# Patient Record
Sex: Female | Born: 1970 | Race: Black or African American | Hispanic: No | Marital: Married | Smoking: Never smoker
Health system: Southern US, Community
[De-identification: ages and names within clinical notes are randomized; demographics above are authoritative.]

## PROBLEM LIST (undated history)

## (undated) DIAGNOSIS — F419 Anxiety disorder, unspecified: Secondary | ICD-10-CM

## (undated) DIAGNOSIS — R0600 Dyspnea, unspecified: Secondary | ICD-10-CM

## (undated) DIAGNOSIS — G43909 Migraine, unspecified, not intractable, without status migrainosus: Secondary | ICD-10-CM

## (undated) DIAGNOSIS — T7840XA Allergy, unspecified, initial encounter: Secondary | ICD-10-CM

## (undated) DIAGNOSIS — D649 Anemia, unspecified: Secondary | ICD-10-CM

## (undated) DIAGNOSIS — K219 Gastro-esophageal reflux disease without esophagitis: Secondary | ICD-10-CM

## (undated) DIAGNOSIS — I10 Essential (primary) hypertension: Secondary | ICD-10-CM

## (undated) DIAGNOSIS — C189 Malignant neoplasm of colon, unspecified: Secondary | ICD-10-CM

## (undated) DIAGNOSIS — F909 Attention-deficit hyperactivity disorder, unspecified type: Secondary | ICD-10-CM

## (undated) DIAGNOSIS — M1990 Unspecified osteoarthritis, unspecified site: Secondary | ICD-10-CM

## (undated) DIAGNOSIS — J189 Pneumonia, unspecified organism: Secondary | ICD-10-CM

## (undated) DIAGNOSIS — J45909 Unspecified asthma, uncomplicated: Secondary | ICD-10-CM

## (undated) DIAGNOSIS — K851 Biliary acute pancreatitis without necrosis or infection: Secondary | ICD-10-CM

## (undated) DIAGNOSIS — E119 Type 2 diabetes mellitus without complications: Secondary | ICD-10-CM

## (undated) HISTORY — DX: Unspecified asthma, uncomplicated: J45.909

## (undated) HISTORY — DX: Allergy, unspecified, initial encounter: T78.40XA

## (undated) HISTORY — PX: BAND HEMORRHOIDECTOMY: SHX1213

## (undated) HISTORY — PX: ABLATION: SHX5711

## (undated) HISTORY — DX: Biliary acute pancreatitis without necrosis or infection: K85.10

## (undated) HISTORY — DX: Malignant neoplasm of colon, unspecified: C18.9

## (undated) HISTORY — DX: Migraine, unspecified, not intractable, without status migrainosus: G43.909

## (undated) HISTORY — DX: Gastro-esophageal reflux disease without esophagitis: K21.9

## (undated) HISTORY — PX: BREAST REDUCTION SURGERY: SHX8

## (undated) HISTORY — DX: Essential (primary) hypertension: I10

---

## 2007-08-29 ENCOUNTER — Emergency Department (HOSPITAL_COMMUNITY): Admission: EM | Admit: 2007-08-29 | Discharge: 2007-08-29 | Payer: Self-pay | Admitting: Emergency Medicine

## 2008-12-25 DIAGNOSIS — K851 Biliary acute pancreatitis without necrosis or infection: Secondary | ICD-10-CM

## 2008-12-25 HISTORY — PX: CHOLECYSTECTOMY: SHX55

## 2008-12-25 HISTORY — DX: Biliary acute pancreatitis without necrosis or infection: K85.10

## 2010-11-05 ENCOUNTER — Emergency Department (HOSPITAL_COMMUNITY): Admission: EM | Admit: 2010-11-05 | Discharge: 2010-11-06 | Payer: Self-pay | Admitting: Emergency Medicine

## 2010-11-06 ENCOUNTER — Emergency Department (HOSPITAL_COMMUNITY): Admission: EM | Admit: 2010-11-06 | Discharge: 2010-11-06 | Payer: Self-pay | Admitting: Emergency Medicine

## 2010-11-10 ENCOUNTER — Emergency Department (HOSPITAL_COMMUNITY): Admission: EM | Admit: 2010-11-10 | Discharge: 2010-11-10 | Payer: Self-pay | Admitting: Emergency Medicine

## 2010-11-22 ENCOUNTER — Observation Stay (HOSPITAL_COMMUNITY): Admission: EM | Admit: 2010-11-22 | Discharge: 2010-11-22 | Payer: Self-pay | Admitting: Emergency Medicine

## 2011-03-07 LAB — COMPREHENSIVE METABOLIC PANEL
ALT: 15 U/L (ref 0–35)
ALT: 20 U/L (ref 0–35)
AST: 20 U/L (ref 0–37)
AST: 16 U/L (ref 0–37)
Albumin: 4 g/dL (ref 3.5–5.2)
Alkaline Phosphatase: 54 U/L (ref 39–117)
Alkaline Phosphatase: 53 U/L (ref 39–117)
CO2: 30 mEq/L (ref 19–32)
Calcium: 8.8 mg/dL (ref 8.4–10.5)
Calcium: 9 mg/dL (ref 8.4–10.5)
Chloride: 103 mEq/L (ref 96–112)
GFR calc Af Amer: >60 mL/min (ref >60)
GFR calc Af Amer: >60 mL/min (ref >60)
GFR calc non Af Amer: >60 mL/min (ref >60)
Glucose, Bld: 92 mg/dL (ref 70–99)
Potassium: 3.2 mEq/L — ABNORMAL LOW (ref 3.5–5.1)
Potassium: 3.2 mEq/L — ABNORMAL LOW (ref 3.5–5.1)
Sodium: 139 mEq/L (ref 135–145)
Sodium: 140 mEq/L (ref 135–145)
Total Bilirubin: 0.6 mg/dL (ref 0.3–1.2)
Total Protein: 7.5 g/dL (ref 6.0–8.3)

## 2011-03-07 LAB — LIPASE, BLOOD
Lipase: 28 U/L (ref 11–59)
Lipase: 27 U/L (ref 11–59)

## 2011-03-07 LAB — DIFFERENTIAL
Basophils Absolute: 0 10*3/uL (ref 0.0–0.1)
Basophils Relative: 1 % (ref 0–1)
Basophils Relative: 1 % (ref 0–1)
Basophils Relative: 1 % (ref 0–1)
Eosinophils Absolute: 0.1 10*3/uL (ref 0.0–0.7)
Eosinophils Absolute: 0.2 10*3/uL (ref 0.0–0.7)
Eosinophils Relative: 4 % (ref 0–5)
Eosinophils Relative: 3 % (ref 0–5)
Lymphocytes Relative: 17 % (ref 12–46)
Lymphs Abs: 2.4 10*3/uL (ref 0.7–4.0)
Lymphs Abs: 1 10*3/uL (ref 0.7–4.0)
Lymphs Abs: 1.6 10*3/uL (ref 0.7–4.0)
Monocytes Absolute: 0.5 10*3/uL (ref 0.1–1.0)
Monocytes Absolute: 0.6 10*3/uL (ref 0.1–1.0)
Monocytes Relative: 10 % (ref 3–12)
Monocytes Relative: 15 % — ABNORMAL HIGH (ref 3–12)
Neutro Abs: 3.8 10*3/uL (ref 1.7–7.7)

## 2011-03-07 LAB — URINE MICROSCOPIC-ADD ON

## 2011-03-07 LAB — URINALYSIS, ROUTINE W REFLEX MICROSCOPIC
Bilirubin Urine: NEGATIVE
Bilirubin Urine: NEGATIVE
Glucose, UA: NEGATIVE mg/dL
Ketones, ur: NEGATIVE mg/dL
Leukocytes, UA: NEGATIVE
Nitrite: NEGATIVE
Nitrite: NEGATIVE
Nitrite: POSITIVE — AB
Protein, ur: 100 mg/dL — AB
Specific Gravity, Urine: 1.025 (ref 1.005–1.030)
Specific Gravity, Urine: 1.011 (ref 1.005–1.030)
pH: 6 (ref 5.0–8.0)
pH: 6.5 (ref 5.0–8.0)
pH: 7 (ref 5.0–8.0)

## 2011-03-07 LAB — CBC
HCT: 37.5 % (ref 36.0–46.0)
HCT: 33.4 % — ABNORMAL LOW (ref 36.0–46.0)
HCT: 36.6 % (ref 36.0–46.0)
HCT: 37 % (ref 36.0–46.0)
Hemoglobin: 10.8 g/dL — ABNORMAL LOW (ref 12.0–15.0)
Hemoglobin: 11.8 g/dL — ABNORMAL LOW (ref 12.0–15.0)
MCH: 26.8 pg (ref 26.0–34.0)
MCHC: 33.2 g/dL (ref 30.0–36.0)
MCHC: 32.3 g/dL (ref 30.0–36.0)
Platelets: 166 10*3/uL (ref 150–400)
Platelets: 171 10*3/uL (ref 150–400)
RBC: 4.03 MIL/uL (ref 3.87–5.11)
RBC: 4.44 MIL/uL (ref 3.87–5.11)
RDW: 16 % — ABNORMAL HIGH (ref 11.5–15.5)
RDW: 14.8 % (ref 11.5–15.5)
WBC: 5.1 10*3/uL (ref 4.0–10.5)
WBC: 5.3 10*3/uL (ref 4.0–10.5)

## 2011-03-07 LAB — URINE CULTURE

## 2011-03-07 LAB — POCT I-STAT, CHEM 8
BUN: 4 mg/dL — ABNORMAL LOW (ref 6–23)
BUN: 7 mg/dL (ref 6–23)
Calcium, Ion: 1.1 mmol/L — ABNORMAL LOW (ref 1.12–1.32)
Chloride: 104 mEq/L (ref 96–112)
Chloride: 99 mEq/L (ref 96–112)
Creatinine, Ser: 0.8 mg/dL (ref 0.4–1.2)
Sodium: 138 mEq/L (ref 135–145)
TCO2: 25 mmol/L (ref 0–100)

## 2011-03-07 LAB — POCT PREGNANCY, URINE
Preg Test, Ur: NEGATIVE
Preg Test, Ur: NEGATIVE
Preg Test, Ur: NEGATIVE

## 2011-09-25 IMAGING — CT CT ABD-PELV W/O CM
2 of 4 series · 17 of 46 positions shown, 19 images · non-contrast
Comparison: 11/05/2010

CLINICAL DATA: Right-sided abdominal pain.  Evaluate for stone.

CT ABDOMEN AND PELVIS WITHOUT CONTRAST
TECHNIQUE: Multidetector CT imaging of the abdomen and pelvis was
performed following the standard protocol without intravenous
contrast.

[Series 2: a/p w/o 5.0 b31f st · axial · non-contrast · 0.90mm/px · z∈[-546,-106]mm · 14 of 98 slices shown, 16 images]
[im 5/98  soft-tissue]
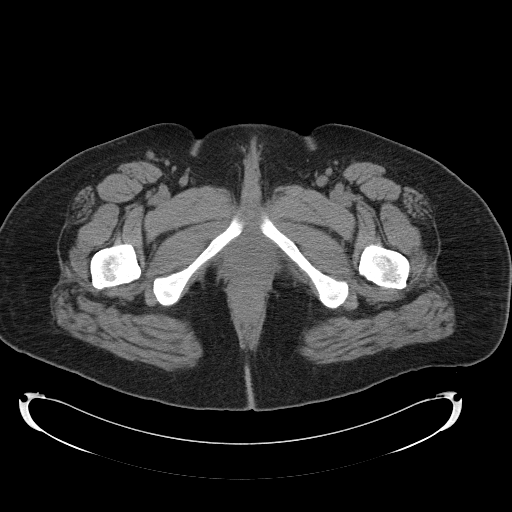
[im 5/98  bone]
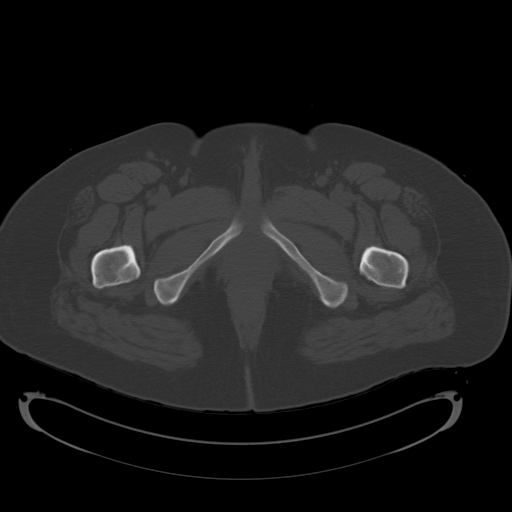
[im 13/98  soft-tissue]
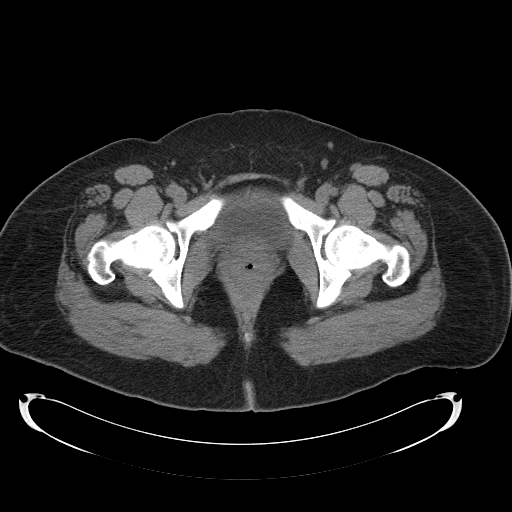
[im 21/98  soft-tissue]
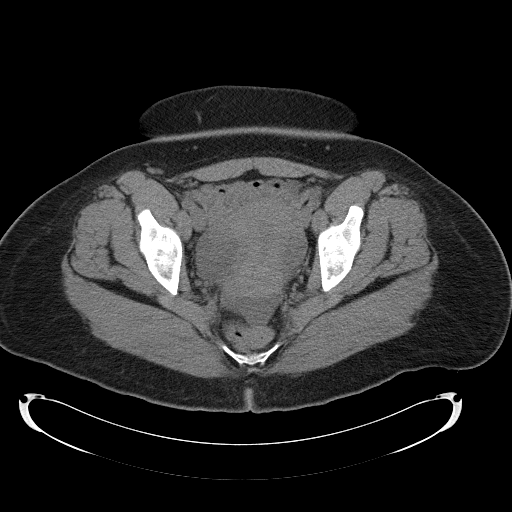
[im 25/98  soft-tissue]
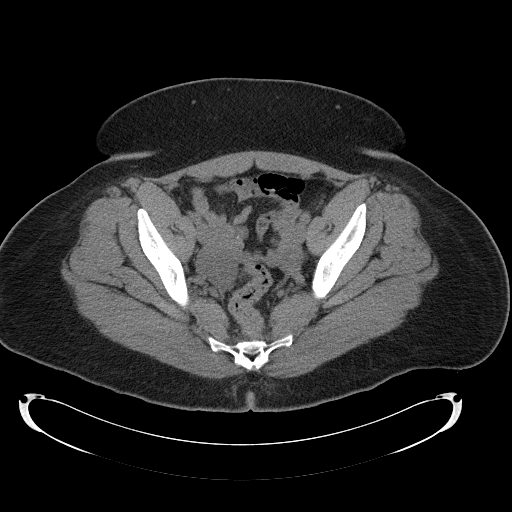
[im 33/98  soft-tissue]
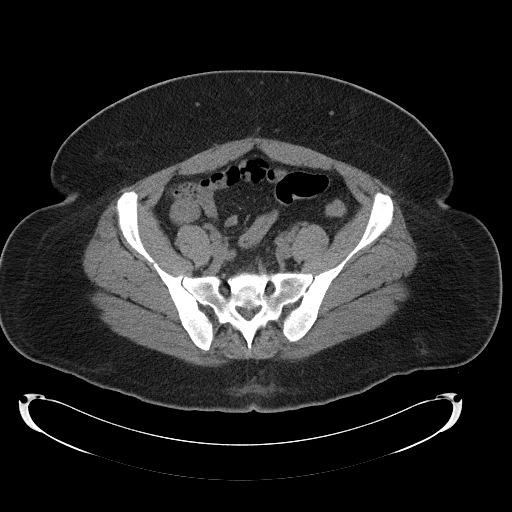
[im 41/98  soft-tissue]
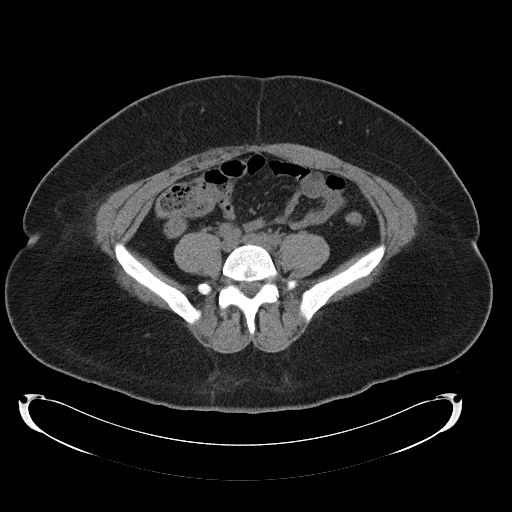
[im 45/98  soft-tissue]
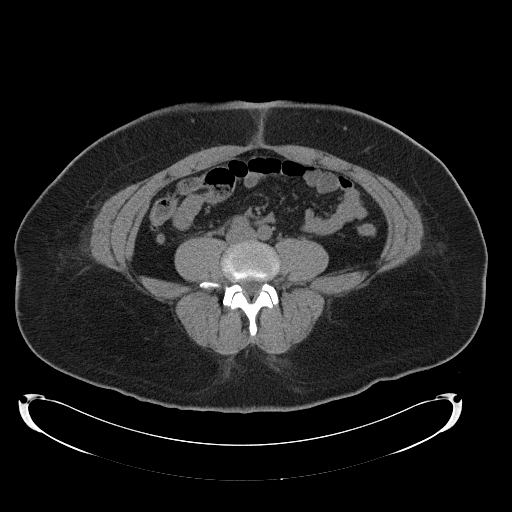
[im 53/98  soft-tissue]
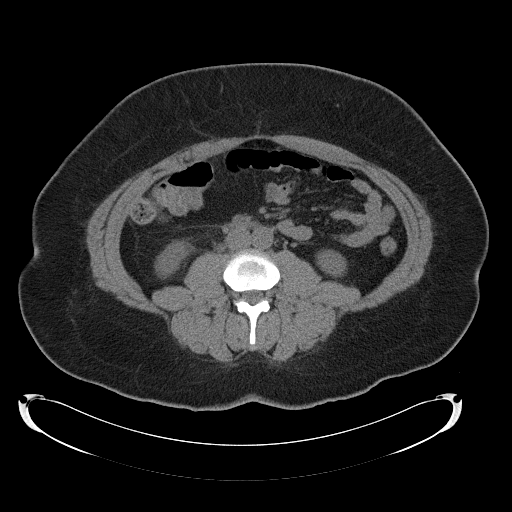
[im 57/98  soft-tissue]
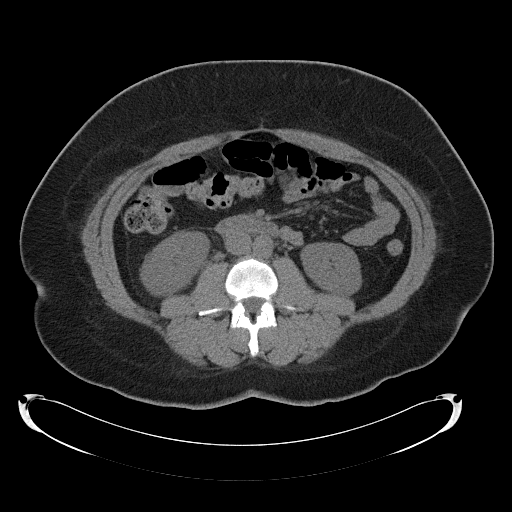
[im 57/98  bone]
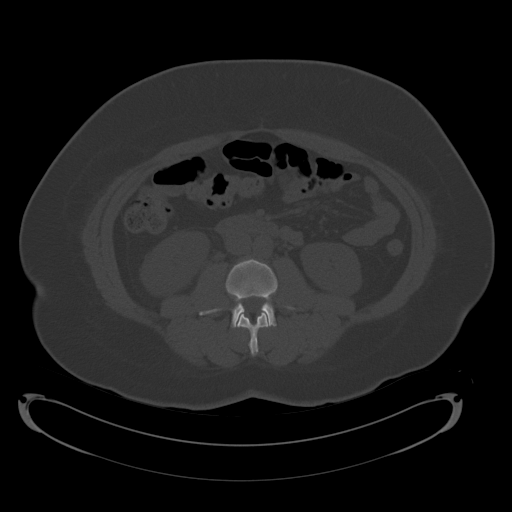
[im 65/98  soft-tissue]
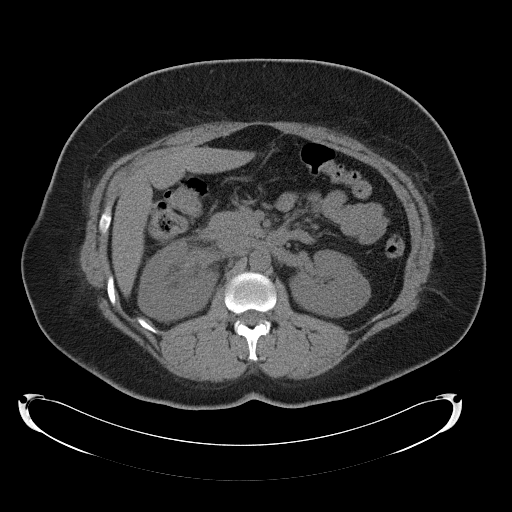
[im 73/98  soft-tissue]
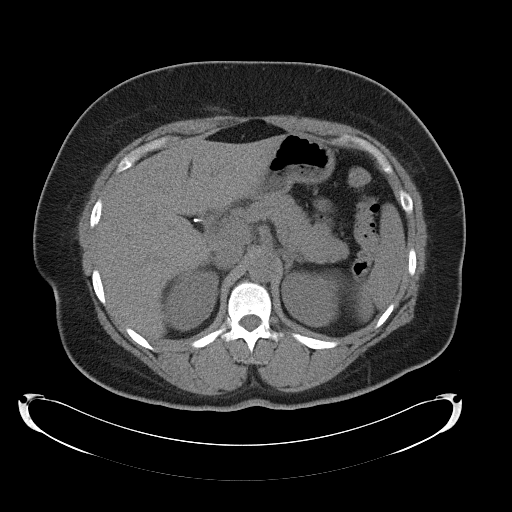
[im 77/98  soft-tissue]
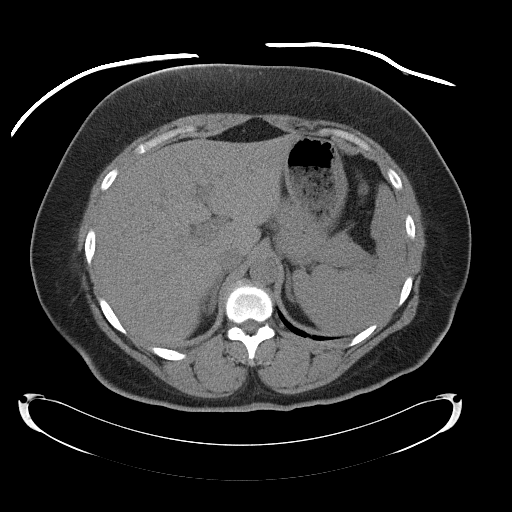
[im 85/98  soft-tissue]
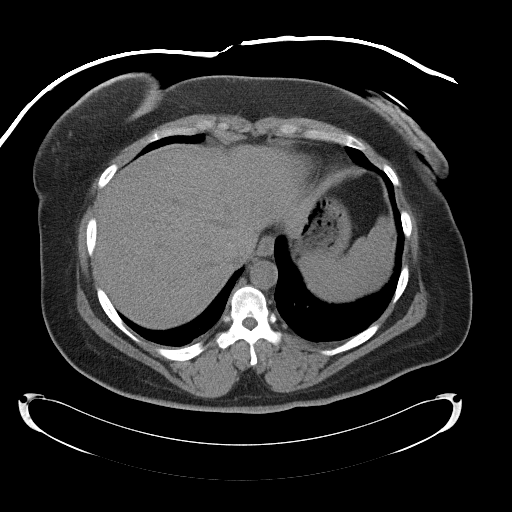
[im 93/98  soft-tissue]
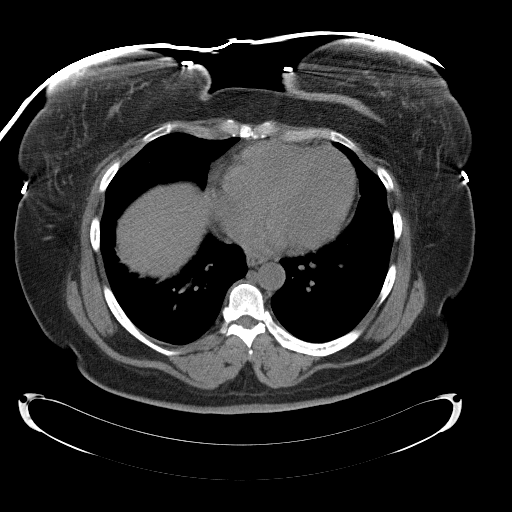

[Series 5: a/p w/o 2.0 spo cor st · coronal · non-contrast · 0.95mm/px · 3 of 163 slices shown]
[im 55/163  soft-tissue]
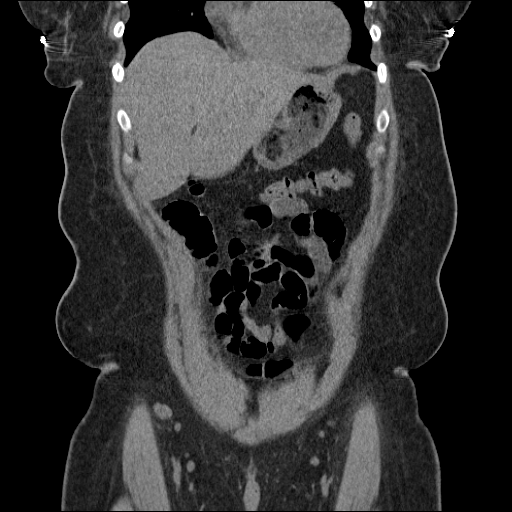
[im 73/163  soft-tissue]
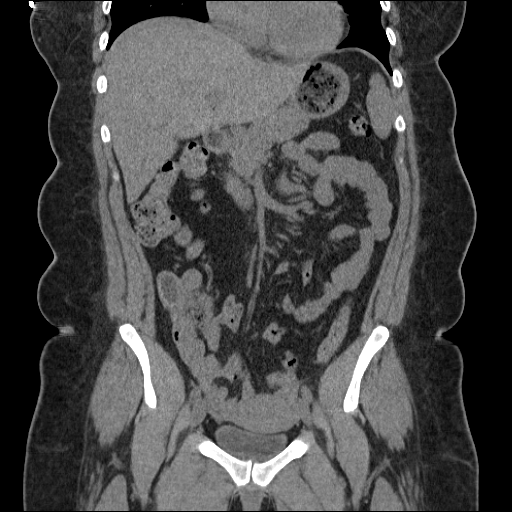
[im 91/163  soft-tissue]
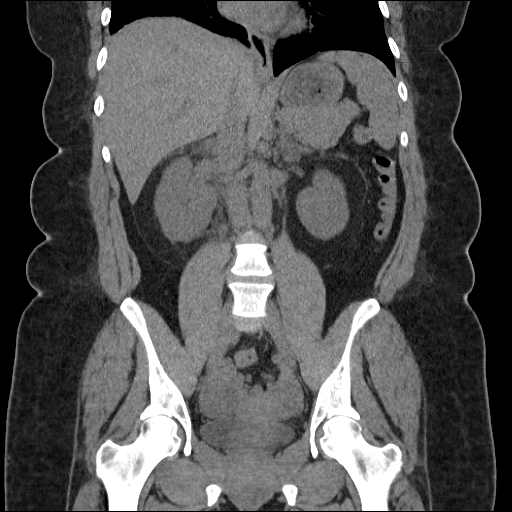

[17 of 46 positions shown; findings below may reference images not displayed]

FINDINGS: The unenhanced appearance of the liver, adrenal glands,
spleen and pancreas is normal.  The patient has had a
cholecystectomy.  The left kidney is normal appearance.  The right
kidney is abnormal with increasing perinephric stranding and
obliteration of the central renal sinus.  However, no radiopaque
stones are seen.  There is no hydronephrosis and there is no stones
seen along the course of the right ureter.  The urinary bladder is
normal.

There is unchanged cystic lesion in the right ovary.  The left
ovary and uterus are normal.  Free fluid is seen in the dependent
portion of the pelvis, increased compared to most recent exam.

The stomach is decompressed.  No small bowel obstruction is seen.
Normal appendix is imaged in the right lower quadrant.  The colon
is unremarkable.
IMPRESSION: 1.  Abnormal appearance of the right kidney which is edematous and
demonstrates adjacent perinephric stranding.  No hydronephrosis or
stone is seen.  These findings are concerning for infection or
vascular occlusion.  Correlation with urinalysis and contrast
enhanced CT is recommended.
2.  Increasing simple attenuating free fluid in the pelvis.
3.  Unchanged right ovarian lesion, likely, a functional follicle.

## 2011-10-06 LAB — DIFFERENTIAL
Basophils Absolute: 0
Basophils Relative: 1
Eosinophils Absolute: 0.2
Eosinophils Relative: 3
Monocytes Absolute: 0.3
Monocytes Relative: 6
Neutro Abs: 3.9

## 2011-10-06 LAB — COMPREHENSIVE METABOLIC PANEL
ALT: 15
AST: 19
Albumin: 3.5
Alkaline Phosphatase: 48
BUN: 6
Chloride: 107
GFR calc Af Amer: >60
Potassium: 3.5
Sodium: 138
Total Bilirubin: 0.4
Total Protein: 6.7

## 2011-10-06 LAB — URINE MICROSCOPIC-ADD ON

## 2011-10-06 LAB — URINALYSIS, ROUTINE W REFLEX MICROSCOPIC
Glucose, UA: NEGATIVE
Hgb urine dipstick: NEGATIVE
Ketones, ur: NEGATIVE
Protein, ur: NEGATIVE
pH: 7

## 2011-10-06 LAB — CBC
HCT: 35.6 — ABNORMAL LOW
Platelets: 144 — ABNORMAL LOW
RDW: 16.5 — ABNORMAL HIGH
WBC: 6.1

## 2014-12-25 DIAGNOSIS — C189 Malignant neoplasm of colon, unspecified: Secondary | ICD-10-CM

## 2014-12-25 HISTORY — DX: Malignant neoplasm of colon, unspecified: C18.9

## 2015-09-06 DIAGNOSIS — Z6834 Body mass index (BMI) 34.0-34.9, adult: Secondary | ICD-10-CM | POA: Insufficient documentation

## 2015-09-06 DIAGNOSIS — S62009A Unspecified fracture of navicular [scaphoid] bone of unspecified wrist, initial encounter for closed fracture: Secondary | ICD-10-CM | POA: Insufficient documentation

## 2015-09-30 HISTORY — PX: COLECTOMY: SHX59

## 2015-10-08 DIAGNOSIS — D500 Iron deficiency anemia secondary to blood loss (chronic): Secondary | ICD-10-CM | POA: Insufficient documentation

## 2015-10-08 DIAGNOSIS — Z9049 Acquired absence of other specified parts of digestive tract: Secondary | ICD-10-CM | POA: Insufficient documentation

## 2016-02-23 DIAGNOSIS — K625 Hemorrhage of anus and rectum: Secondary | ICD-10-CM | POA: Insufficient documentation

## 2016-02-23 DIAGNOSIS — E611 Iron deficiency: Secondary | ICD-10-CM | POA: Insufficient documentation

## 2017-08-06 DIAGNOSIS — Z0000 Encounter for general adult medical examination without abnormal findings: Secondary | ICD-10-CM | POA: Insufficient documentation

## 2017-08-08 DIAGNOSIS — R0600 Dyspnea, unspecified: Secondary | ICD-10-CM | POA: Insufficient documentation

## 2017-08-08 DIAGNOSIS — G479 Sleep disorder, unspecified: Secondary | ICD-10-CM | POA: Insufficient documentation

## 2017-08-21 DIAGNOSIS — R9439 Abnormal result of other cardiovascular function study: Secondary | ICD-10-CM | POA: Insufficient documentation

## 2017-08-29 ENCOUNTER — Telehealth: Payer: Self-pay

## 2017-08-29 NOTE — Telephone Encounter (Signed)
Pt received letter from DS to be set up for a colonoscopy. She can be reached at [phone]

## 2017-08-30 NOTE — Telephone Encounter (Signed)
LMOM for a return call. Pt will need and OV prior to scheduling a colonoscopy. The referral states that pt was diagnosed with colon cancer in 2016 by Dr. Jacquiline Doe, and has not had any follow up with GI since.

## 2017-08-30 NOTE — Telephone Encounter (Signed)
OV with Neil Crouch, PA on 10/16/2017 @ 8:30 Am.

## 2017-09-07 ENCOUNTER — Other Ambulatory Visit: Payer: Self-pay | Admitting: *Deleted

## 2017-09-07 ENCOUNTER — Encounter: Payer: Self-pay | Admitting: *Deleted

## 2017-09-10 ENCOUNTER — Ambulatory Visit (INDEPENDENT_AMBULATORY_CARE_PROVIDER_SITE_OTHER): Payer: Managed Care, Other (non HMO) | Admitting: Cardiovascular Disease

## 2017-09-10 ENCOUNTER — Telehealth: Payer: Self-pay | Admitting: Cardiovascular Disease

## 2017-09-10 ENCOUNTER — Encounter: Payer: Self-pay | Admitting: *Deleted

## 2017-09-10 ENCOUNTER — Encounter: Payer: Self-pay | Admitting: Cardiovascular Disease

## 2017-09-10 VITALS — BP 117/83 | HR 77 | Ht 71.0 in | Wt 277.0 lb

## 2017-09-10 DIAGNOSIS — R0609 Other forms of dyspnea: Secondary | ICD-10-CM | POA: Diagnosis not present

## 2017-09-10 DIAGNOSIS — R079 Chest pain, unspecified: Secondary | ICD-10-CM | POA: Diagnosis not present

## 2017-09-10 DIAGNOSIS — I10 Essential (primary) hypertension: Secondary | ICD-10-CM | POA: Diagnosis not present

## 2017-09-10 NOTE — Progress Notes (Signed)
CARDIOLOGY CONSULT NOTE  Patient ID: TANZA PELLOT MRN: [id number] DOB/AGE: 1971/08/04 46 y.o.  Admit date: (Not on file) Primary Physician: Allwardt, Alyssa, PA Referring Physician: [place]  Reason for Consultation: chest pain  HPI: Shelly Larson is a 46 y.o. female who is being seen today for the evaluation of chest pain at the request of Allwardt, Alyssa, PA.   She initially began developing headaches and had it evaluated by her PCP. She was markedly hypertensive. Blood pressures were as high as 180/120. She was started on lisinopril 10 mg. She tried this for at least 2 weeks and it did not reduce her blood pressure. This was then increased to 20 mg with the addition of hydrochlorothiazide 12.5 mg. She said she checks her blood pressure at work and it is frequently elevated.  It is 117/83 in our office today.  For the past few weeks, she has been experiencing upper central chest tightness and exertional dyspnea. This does not occur at rest. It occurs when she is walking and exercising. She has had associated nausea, vomiting, and dizziness. She denies syncope.  She denies orthopnea, leg swelling, and paroxysmal nocturnal dyspnea.  She checks her blood pressure at work late last week and it was 178/99. She is surprised by the normotensive reading in our office today.  Exercise treadmill stress test on 08/17/17 at [facility] reportedly demonstrated ECG changes consistent with ischemia with 2 mm horizontal ST segment depression in the inferior leads. She exercised for 5 minutes and 43 seconds.  Family history: 2 maternal aunts who are my patients Raguel Kosloski and Thyra Breed) also have coronary artery disease. Her sister underwent some form of heart surgery.    No Known Allergies  Current Outpatient Prescriptions  Medication Sig Dispense Refill  . lisinopril-hydrochlorothiazide (PRINZIDE,ZESTORETIC) 20-12.5 MG tablet     . nitroGLYCERIN (NITROSTAT) 0.4 MG SL  tablet      No current facility-administered medications for this visit.     Past Medical History:  Diagnosis Date  . Hypertension     Past Surgical History:  Procedure Laterality Date  . CHOLECYSTECTOMY  09/07/2015  . COLECTOMY  09/30/2015   distal transverse d/t adenocarcinoma    Social History   Social History  . Marital status: Single    Spouse name: N/A  . Number of children: N/A  . Years of education: N/A   Occupational History  . Not on file.   Social History Main Topics  . Smoking status: Never Smoker  . Smokeless tobacco: Never Used  . Alcohol use Not on file  . Drug use: Unknown  . Sexual activity: Not on file   Other Topics Concern  . Not on file   Social History Narrative  . No narrative on file     No family history of premature CAD in 1st degree relatives.  Current Meds  Medication Sig  . lisinopril-hydrochlorothiazide (PRINZIDE,ZESTORETIC) 20-12.5 MG tablet   . nitroGLYCERIN (NITROSTAT) 0.4 MG SL tablet       Review of systems complete and found to be negative unless listed above in HPI    Physical exam Blood pressure 117/83, pulse 77, height 5\' 11"  (1.803 m), weight 277 lb (125.6 kg), SpO2 97 %. General: NAD Neck: No JVD, no thyromegaly or thyroid nodule.  Lungs: Clear to auscultation bilaterally with normal respiratory effort. CV: Nondisplaced PMI. Regular rate and rhythm, normal S1/S2, no S3/S4, no murmur.  No peripheral edema.  No carotid bruit.  Abdomen: Soft, nontender, no distention.  Skin: Intact without lesions or rashes.  Neurologic: Alert and oriented x 3.  Psych: Normal affect. Extremities: No clubbing or cyanosis.  HEENT: Normal.   ECG: Most recent ECG reviewed.   Labs: Lab Results  Component Value Date/Time   K 3.1 (L) 11/21/2010 08:43 PM   BUN 7 11/21/2010 08:43 PM   CREATININE 0.9 11/21/2010 08:43 PM   ALT 20 11/10/2010 08:36 PM   HGB 10.8 DELTA CHECK NOTED (L) 11/22/2010 06:03 AM     Lipids: No  results found for: LDLCALC, LDLDIRECT, CHOL, TRIG, HDL      ASSESSMENT AND PLAN:  1. Chest tightness and exertional dyspnea with abnormal exercise treadmill stress test: In spite of the abnormal stress test suggestive of inferior ischemia, her pretest probability for coronary artery disease is actually quite low. Her primary risk factor is hypertension. She does not smoke. I will order a 2-D echocardiogram with Doppler to evaluate cardiac structure, function, and regional wall motion. She may have a hypertensive response to exercise which could also contribute to symptoms as well as ECG abnormalities, which are nonspecific. If I am unable to control her symptoms with medical therapy, I would then consider coronary angiography.  2. Hypertension: I have asked the patient to check blood pressure readings 3 times per week while at work, at different times throughout the day, in order to get a better approximation of mean BP values. These results will be provided to me at the end of that period so that I can determine if antihypertensive medication titration is indicated. I will order a 2-D echocardiogram with Doppler to evaluate cardiac structure, function, and regional wall motion.      Disposition: Follow up in 1 month  Signed: Kate Sable, M.D., F.A.C.C.  09/10/2017, 8:48 AM

## 2017-09-10 NOTE — Patient Instructions (Addendum)
Medication Instructions:  Continue all current medications.  Labwork: none  Testing/Procedures:  Your physician has requested that you have an echocardiogram. Echocardiography is a painless test that uses sound waves to create images of your heart. It provides your doctor with information about the size and shape of your heart and how well your heart's chambers and valves are working. This procedure takes approximately one hour. There are no restrictions for this procedure.  Office will contact with results via phone or letter.    Follow-Up: 4-6 weeks  Any Other Special Instructions Will Be Listed Below (If Applicable). Your physician has requested that you regularly monitor and record your blood pressure readings at home. Please take readings 3 x per week for 1 month & return to office for MD review.    If you need a refill on your cardiac medications before your next appointment, please call your pharmacy.

## 2017-09-10 NOTE — Telephone Encounter (Signed)
Echo - CP, doe Scheduled 9/20 in [facility]

## 2017-09-13 ENCOUNTER — Ambulatory Visit (INDEPENDENT_AMBULATORY_CARE_PROVIDER_SITE_OTHER): Payer: Managed Care, Other (non HMO)

## 2017-09-13 ENCOUNTER — Other Ambulatory Visit: Payer: Self-pay

## 2017-09-13 DIAGNOSIS — R079 Chest pain, unspecified: Secondary | ICD-10-CM | POA: Diagnosis not present

## 2017-09-13 DIAGNOSIS — R0609 Other forms of dyspnea: Secondary | ICD-10-CM

## 2017-09-19 ENCOUNTER — Telehealth: Payer: Self-pay | Admitting: *Deleted

## 2017-09-19 NOTE — Telephone Encounter (Signed)
Notes recorded by Laurine Blazer, LPN on 2/99/2426 at 8:34 AM EDT Patient notified. Copy to pmd. Follow up scheduled for 10-10-17 with Dr. Bronson Ing. ------  Notes recorded by Herminio Commons, MD on 09/13/2017 at 4:54 PM EDT Normal pumping function.

## 2017-10-09 ENCOUNTER — Ambulatory Visit: Payer: Managed Care, Other (non HMO) | Admitting: Cardiovascular Disease

## 2017-10-10 ENCOUNTER — Ambulatory Visit (INDEPENDENT_AMBULATORY_CARE_PROVIDER_SITE_OTHER): Payer: Managed Care, Other (non HMO) | Admitting: Cardiovascular Disease

## 2017-10-10 ENCOUNTER — Ambulatory Visit: Payer: Managed Care, Other (non HMO) | Admitting: Cardiovascular Disease

## 2017-10-10 ENCOUNTER — Encounter: Payer: Self-pay | Admitting: Cardiovascular Disease

## 2017-10-10 VITALS — BP 174/98 | HR 76 | Ht 71.0 in | Wt 283.0 lb

## 2017-10-10 DIAGNOSIS — R0609 Other forms of dyspnea: Secondary | ICD-10-CM | POA: Diagnosis not present

## 2017-10-10 DIAGNOSIS — R079 Chest pain, unspecified: Secondary | ICD-10-CM | POA: Diagnosis not present

## 2017-10-10 DIAGNOSIS — I10 Essential (primary) hypertension: Secondary | ICD-10-CM | POA: Diagnosis not present

## 2017-10-10 MED ORDER — AMLODIPINE BESYLATE 5 MG PO TABS: 5.0000 mg | ORAL_TABLET | Freq: Every day | ORAL | 6 refills | Status: DC

## 2017-10-10 NOTE — Patient Instructions (Signed)
Medication Instructions:   Begin Norvasc 5mg  daily.  Continue all other medications.    Labwork: none  Testing/Procedures: none  Follow-Up: 1 month   Any Other Special Instructions Will Be Listed Below (If Applicable).  If you need a refill on your cardiac medications before your next appointment, please call your pharmacy.

## 2017-10-10 NOTE — Progress Notes (Signed)
SUBJECTIVE: The patient returns for follow-up after undergoing cardiovascular testing performed for the evaluation of chest pain and exertional dyspnea.  Echocardiogram 09/13/17: Vigorous left ventricular systolic function, LVEF 52-84%, mild LVH, indeterminate diastolic function.  She has malignant hypertension.  Blood pressures at home have been in the 170-180/80s-101 range. She has had upper respiratory symptoms for the past few weeks and has been tried on different antibiotics.  She had an MRI of her brain at [facility], but results are unavailable to me at this time. I have asked my staff to request them.  She continues to experience exertional dyspnea with minimal exertion. She also has a tightness in the upper chest with exertion.     Family history: 2 maternal aunts who are my patients Oluwatomisin Hustead and Thyra Breed) also have coronary artery disease. Her sister underwent some form of heart surgery.  Review of Systems: As per "subjective", otherwise negative.  No Known Allergies  Current Outpatient Prescriptions  Medication Sig Dispense Refill  . doxycycline (VIBRA-TABS) 100 MG tablet Take 100 mg by mouth 2 (two) times daily with a meal.  0  . lisinopril-hydrochlorothiazide (PRINZIDE,ZESTORETIC) 20-12.5 MG tablet     . metoprolol succinate (TOPROL-XL) 50 MG 24 hr tablet Take 50 mg by mouth daily.  0  . nitroGLYCERIN (NITROSTAT) 0.4 MG SL tablet     . topiramate (TOPAMAX) 25 MG tablet TAKE 1 TABLET BY MOUTH AT BEDTIME FOR MIGRAINE PREVENTION  0   No current facility-administered medications for this visit.     Past Medical History:  Diagnosis Date  . Hypertension     Past Surgical History:  Procedure Laterality Date  . CHOLECYSTECTOMY  09/07/2015  . COLECTOMY  09/30/2015   distal transverse d/t adenocarcinoma    Social History   Social History  . Marital status: Single    Spouse name: N/A  . Number of children: N/A  . Years of education: N/A    Occupational History  . Not on file.   Social History Main Topics  . Smoking status: Never Smoker  . Smokeless tobacco: Never Used  . Alcohol use Not on file  . Drug use: Unknown  . Sexual activity: Not on file   Other Topics Concern  . Not on file   Social History Narrative  . No narrative on file     Vitals:   10/10/17 1137  BP: (!) 174/98  Pulse: 76  SpO2: 95%  Weight: 283 lb (128.4 kg)  Height: 5\' 11"  (1.803 m)    Wt Readings from Last 3 Encounters:  10/10/17 283 lb (128.4 kg)  09/10/17 277 lb (125.6 kg)     PHYSICAL EXAM General: NAD HEENT: Normal. Neck: No JVD, no thyromegaly. Lungs: Clear to auscultation bilaterally with normal respiratory effort. CV: Nondisplaced PMI.  Regular rate and rhythm, normal S1/S2, no S3/S4, no murmur. No pretibial or periankle edema.  No carotid bruit.   Abdomen: Soft, nontender, no distention.  Neurologic: Alert and oriented.  Psych: Normal affect. Skin: Normal. Musculoskeletal: No gross deformities.    ECG: Most recent ECG reviewed.   Labs: Lab Results  Component Value Date/Time   K 3.1 (L) 11/21/2010 08:43 PM   BUN 7 11/21/2010 08:43 PM   CREATININE 0.9 11/21/2010 08:43 PM   ALT 20 11/10/2010 08:36 PM   HGB 10.8 DELTA CHECK NOTED (L) 11/22/2010 06:03 AM     Lipids: No results found for: LDLCALC, LDLDIRECT, CHOL, TRIG, HDL     ASSESSMENT  AND PLAN:  1. Chest tightness and exertional dyspnea with abnormal exercise treadmill stress test: In spite of the abnormal stress test suggestive of inferior ischemia, her pretest probability for coronary artery disease is actually quite low. Her primary risk factor is hypertension. She does not smoke. She may have a hypertensive response to exercise which could also contribute to symptoms as well, as well as ECG abnormalities, which are nonspecific. Left ventricular systolic function and regional wall motion are normal. If I am unable to control her symptoms with medical  therapy, I would then consider coronary angiography. I will start amlodipine 5 mg daily.  2. Malignant hypertension: Blood pressures remain markedly elevated. I will start amlodipine 5 mg daily.    Disposition: Follow up 1 month.   Kate Sable, M.D., F.A.C.C.

## 2017-10-16 ENCOUNTER — Other Ambulatory Visit: Payer: Self-pay

## 2017-10-16 ENCOUNTER — Encounter: Payer: Self-pay | Admitting: Gastroenterology

## 2017-10-16 ENCOUNTER — Ambulatory Visit (INDEPENDENT_AMBULATORY_CARE_PROVIDER_SITE_OTHER): Payer: Managed Care, Other (non HMO) | Admitting: Gastroenterology

## 2017-10-16 DIAGNOSIS — K648 Other hemorrhoids: Secondary | ICD-10-CM | POA: Diagnosis not present

## 2017-10-16 DIAGNOSIS — R198 Other specified symptoms and signs involving the digestive system and abdomen: Secondary | ICD-10-CM | POA: Diagnosis not present

## 2017-10-16 DIAGNOSIS — C184 Malignant neoplasm of transverse colon: Secondary | ICD-10-CM

## 2017-10-16 DIAGNOSIS — K625 Hemorrhage of anus and rectum: Secondary | ICD-10-CM | POA: Diagnosis not present

## 2017-10-16 DIAGNOSIS — Z85038 Personal history of other malignant neoplasm of large intestine: Secondary | ICD-10-CM

## 2017-10-16 MED ORDER — NA SULFATE-K SULFATE-MG SULF 17.5-3.13-1.6 GM/177ML PO SOLN: 1.0000 | ORAL | 0 refills | Status: DC

## 2017-10-16 NOTE — Progress Notes (Signed)
Primary Care Physician:  Allwardt, Yetta Flock, PA  Primary Gastroenterologist:  Garfield Cornea, MD   Chief Complaint  Patient presents with  . Colonoscopy    dx w/ colon cancer in 2016    HPI:  Shelly Larson is a 46 y.o. female here for request of Alyssa Allwardt, PA with Dr. Nadara Mustard for anoscopy.  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 Burke Keels.  Patient has never had postsurgical colonoscopy.  She was evaluated by medical oncologist, Dr. Minette Headland, no adjuvant therapy offered.  When she presented with colon cancer in 2016, she presented due to profound symptomatic anemia, hemoglobin of 3.  She has had issues with heavy menses, actively seeing her gynecologist, plans for insertion of IUD later this week.  She has a history of uterine fibroids and heavy menstrual cycles.  Currently her hemoglobin is normal.  GI standpoint she has had some change in her bowel habits over the past couple of months.  She has had multiple rounds of antibiotics for upper respiratory tract infection.  With stools can alternate from hard, [place] 2  To [place] 5 or 6 all within the same day.  Typically has a bowel movement with each meal.  2-3 stools daily.  Occasional nocturnal stools which she has had since her colon resection.  She has issues with protruding hemorrhoids especially when she has hard stools.  Associated with rectal bleeding.  She would like definitive management of her hemorrhoids.  No upper GI symptoms such as heartburn or dysphagia.  Appetite is good.  Believe she had genetic testing by Dr. Anthony Sar     Current Outpatient Prescriptions  Medication Sig Dispense Refill  . amLODipine (NORVASC) 5 MG tablet Take 1 tablet (5 mg total) by mouth daily. 30 tablet 6  . doxycycline (VIBRA-TABS) 100 MG tablet Take 100 mg by mouth 2 (two) times daily with a meal.  0  . ibuprofen (ADVIL,MOTRIN) 200 MG tablet Take 200 mg by mouth  every 6 (six) hours as needed.    Marland Kitchen lisinopril-hydrochlorothiazide (PRINZIDE,ZESTORETIC) 20-12.5 MG tablet Take 1 tablet by mouth daily.     . metoprolol succinate (TOPROL-XL) 50 MG 24 hr tablet Take 50 mg by mouth daily.  0  . nitroGLYCERIN (NITROSTAT) 0.4 MG SL tablet every 5 (five) minutes as needed.     . topiramate (TOPAMAX) 25 MG tablet TAKE 1 TABLET BY MOUTH AT BEDTIME FOR MIGRAINE PREVENTION  0   No current facility-administered medications for this visit.     Allergies as of 10/16/2017  . (No Known Allergies)    Past Medical History:  Diagnosis Date  . Biliary acute pancreatitis 2010  . Colon cancer ([place]) 2016   Transverse colon   . Hypertension   . Migraine     Past Surgical History:  Procedure Laterality Date  . CHOLECYSTECTOMY  2010   [place], biliary pancreatitis  . COLECTOMY  09/30/2015   distal transverse d/t adenocarcinoma    Family History  Problem Relation Age of Onset  . Hypertension Mother   . Diabetes Mellitus II Mother   . Heart disease Sister        dx in late 2's  . Breast cancer Paternal Grandmother   . Breast cancer Paternal Aunt   . Colon cancer Neg Hx     Social History   Social History  . Marital status: Single    Spouse name: N/A  . Number of children:  N/A  . Years of education: N/A   Occupational History  . warranty analyst    Social History Main Topics  . Smoking status: Never Smoker  . Smokeless tobacco: Never Used  . Alcohol use Yes     Comment: rare  . Drug use: No  . Sexual activity: Not on file   Other Topics Concern  . Not on file   Social History Narrative  . No narrative on file      ROS:  General: Negative for anorexia, weight loss, fever, chills, fatigue, weakness. Eyes: Negative for vision changes.  ENT: Negative for hoarseness, difficulty swallowing , nasal congestion. CV: Negative for chest pain, angina, palpitations, dyspnea on exertion, peripheral edema.  Respiratory: Negative for dyspnea at  rest, dyspnea on exertion, cough, sputum, wheezing.  GI: See history of present illness. GU:  Negative for dysuria, hematuria, urinary incontinence, urinary frequency, nocturnal urination.  MS: Negative for joint pain, low back pain.  Derm: Negative for rash or itching.  Neuro: Negative for weakness, abnormal sensation, seizure, frequent headaches, memory loss, confusion.  Psych: Negative for anxiety, depression, suicidal ideation, hallucinations.  Endo: Negative for unusual weight change.  Heme: Negative for bruising or bleeding. Allergy: Negative for rash or hives.    Physical Examination:  BP (!) 160/109   Pulse 74   Temp 97.8 F (36.6 C) (Oral)   Ht 5\' 11"  (1.803 m)   Wt 276 lb 9.6 oz (125.5 kg)   LMP 10/12/2017 (Exact Date)   BMI 38.58 kg/m    General: Well-nourished, well-developed in no acute distress.  Head: Normocephalic, atraumatic.   Eyes: Conjunctiva pink, no icterus. Mouth: Oropharyngeal mucosa moist and pink , no lesions erythema or exudate. Neck: Supple without thyromegaly, masses, or lymphadenopathy.  Lungs: Clear to auscultation bilaterally.  Heart: Regular rate and rhythm, no murmurs rubs or gallops.  Abdomen: Bowel sounds are normal, nontender, nondistended, no hepatosplenomegaly or masses, no abdominal bruits or    hernia , no rebound or guarding.  Well-healed midline incision, mild pain with palpation over incision. Rectal: Deferred Extremities: No lower extremity edema. No clubbing or deformities.  Neuro: Alert and oriented x 4 , grossly normal neurologically.  Skin: Warm and dry, no rash or jaundice.   Psych: Alert and cooperative, normal mood and affect.  Labs: Labs from August 06, 2017 White blood cell count 5200, hemoglobin 13.2, hematocrit 41.8, MCV 81, platelets 155,000, glucose 104, BUN 7, creatinine 0.71, total bilirubin 0.3, alkaline phosphatase 67, AST 18, ALT 18, albumin 4.2, TSH 1.780  Imaging Studies: No results found.

## 2017-10-16 NOTE — Assessment & Plan Note (Signed)
46 year old female with personal history of transverse colon adenocarcinoma (stage II).  Underwent surgical status post partial colectomy 2016 by Dr. Burke Keels.  Patient's lymph nodes were all negative however she did have full-thickness involvement of the colonic wall with perforation into the pericolonic adipose tissue.  She was seen by medical oncologist, Dr. Becky Sax.  No adjuvant therapy recommended.  At this time she has not had follow-up colonoscopy.  Clinically she is doing okay.  She has had some change in bowel habits in the setting of multiple antibiotics.  Anywhere from hard lumpy stool, to mushy stool occurring mostly postprandially.  Given frequent hard stools, unlikely she has C. difficile.  Issues with hemorrhoids prolapsing and bleeding.  Would like definitive treatment of her hemorrhoids.  Recommend colonoscopy at this time.  Augment conscious sedation with Phenergan 25 mg IV 45 minutes before the procedure.  I have discussed the risks, alternatives, benefits with regards to but not limited to the risk of reaction to medication, bleeding, infection, perforation and the patient is agreeable to proceed. Written consent to be obtained.

## 2017-10-16 NOTE — Progress Notes (Signed)
Please make sure patient is aware that her children and siblings should have first colonoscopy by age of 108 and then every 5 years, given her h/o colon cancer at age 47.

## 2017-10-16 NOTE — Progress Notes (Signed)
CC'D TO PCP °

## 2017-10-16 NOTE — Patient Instructions (Signed)
1. Colonoscopy as scheduled.  See separate instructions. 2. Samples of Linzess provided.  4-5 days prior to your bowel prep for colonoscopy, if you find that you are having hard stools or nonproductive stools, please start Linzess once daily.

## 2017-10-17 ENCOUNTER — Encounter: Payer: Self-pay | Admitting: *Deleted

## 2017-11-13 ENCOUNTER — Telehealth: Payer: Self-pay | Admitting: Internal Medicine

## 2017-11-13 NOTE — Telephone Encounter (Signed)
Tammy from Endo called to say patient told her that she had spoken to someone about cancelling her procedure with R[facility] in the morning. Tammy said she didn't see anything documented in epic and wasn't sure who she had spoken with. Tammy just wanted [place] to be aware that patient was cancelling.

## 2017-11-13 NOTE — Telephone Encounter (Signed)
I just spoke with patient and made Hoyle Sauer aware as well.

## 2017-11-14 ENCOUNTER — Ambulatory Visit (HOSPITAL_COMMUNITY)
Admission: RE | Admit: 2017-11-14 | Payer: Managed Care, Other (non HMO) | Source: Ambulatory Visit | Admitting: Internal Medicine

## 2017-11-14 ENCOUNTER — Encounter (HOSPITAL_COMMUNITY): Admission: RE | Payer: Self-pay | Source: Ambulatory Visit

## 2017-11-14 SURGERY — COLONOSCOPY
Anesthesia: Moderate Sedation

## 2017-11-20 ENCOUNTER — Ambulatory Visit: Payer: Managed Care, Other (non HMO) | Admitting: Cardiovascular Disease

## 2017-11-20 ENCOUNTER — Encounter: Payer: Self-pay | Admitting: Cardiovascular Disease

## 2017-11-21 NOTE — Progress Notes (Signed)
Pt notified as directed.

## 2017-12-31 ENCOUNTER — Ambulatory Visit: Payer: Managed Care, Other (non HMO) | Admitting: Gastroenterology

## 2017-12-31 ENCOUNTER — Telehealth: Payer: Self-pay | Admitting: Gastroenterology

## 2017-12-31 ENCOUNTER — Encounter: Payer: Self-pay | Admitting: Gastroenterology

## 2017-12-31 NOTE — Telephone Encounter (Signed)
Routing to schedule pt.

## 2017-12-31 NOTE — Telephone Encounter (Signed)
PATIENT WAS A NO SHOW AND LETTER SENT  °

## 2017-12-31 NOTE — Telephone Encounter (Signed)
Please call patient and reschedule as she has never had colonoscopy since her colon cancer treatment. Very important that she practices compliance.

## 2018-01-02 NOTE — Telephone Encounter (Signed)
CALLED PATIENT AND LEFT MESSAGE TO CALL AND RESCHEDULE

## 2018-05-03 LAB — BASIC METABOLIC PANEL
BUN: 9 (ref 4–21)
Creatinine: 0.8 (ref <=1.1)

## 2018-05-03 LAB — TSH: TSH: 1.09 (ref <=5.90)

## 2018-05-03 LAB — LIPID PANEL
Cholesterol: 134 (ref 0–200)
HDL: 39 (ref 35–70)
LDL CALC: 67
Triglycerides: 141 (ref 40–160)

## 2018-05-03 LAB — HEMOGLOBIN A1C: HEMOGLOBIN A1C: 6.1

## 2018-05-07 ENCOUNTER — Ambulatory Visit: Payer: Managed Care, Other (non HMO) | Admitting: Gastroenterology

## 2018-05-07 ENCOUNTER — Encounter: Payer: Self-pay | Admitting: Gastroenterology

## 2018-05-07 ENCOUNTER — Other Ambulatory Visit: Payer: Self-pay

## 2018-05-07 VITALS — BP 160/98 | HR 82 | Temp 97.0°F | Ht 70.0 in | Wt 277.0 lb

## 2018-05-07 DIAGNOSIS — K649 Unspecified hemorrhoids: Secondary | ICD-10-CM

## 2018-05-07 DIAGNOSIS — K625 Hemorrhage of anus and rectum: Secondary | ICD-10-CM

## 2018-05-07 DIAGNOSIS — K648 Other hemorrhoids: Secondary | ICD-10-CM

## 2018-05-07 DIAGNOSIS — Z85038 Personal history of other malignant neoplasm of large intestine: Secondary | ICD-10-CM

## 2018-05-07 MED ORDER — NA SULFATE-K SULFATE-MG SULF 17.5-3.13-1.6 GM/177ML PO SOLN: 1.0000 | ORAL | 0 refills | Status: DC

## 2018-05-07 NOTE — Assessment & Plan Note (Signed)
47 year old female with history of stage II colon cancer (diagnosed in 2016) who has never undergone post surgical colonoscopy.   Patient's lymph nodes were all negative however she did have full-thickness involvement of the colonic wall with perforation into the pericolonic adipose tissue.  She was seen by medical oncologist, Dr. Becky Sax.  No adjuvant therapy recommended.    Patient does have some alternating hard stools with loose stools, typically has a bowel movement most days.  Sometimes incomplete bowel movements.  Symptoms have been occurring for a little over 6 months.  She continues to have rectal pain/rectal bleeding felt to be related to hemorrhoids.  Evidence of stage III hemorrhoids on exam.  Encouraged her to complete Anusol suppositories and continue to use Anusol cream externally.  She may be a candidate for hemorrhoid banding but ultimately may require surgical excision, will evaluate at time of colonoscopy.  Recommend colonoscopy at this time.  I have discussed the risks, alternatives, benefits with regards to but not limited to the risk of reaction to medication, bleeding, infection, perforation and the patient is agreeable to proceed. Written consent to be obtained.

## 2018-05-07 NOTE — Progress Notes (Signed)
Primary Care Physician: Allwardt, Yetta Flock, PA  Primary Gastroenterologist:  Garfield Cornea, MD   Chief Complaint  Patient presents with  . Hemorrhoids    blood when she wipes, painful    HPI: Shelly Larson is a 47 y.o. female here for management of hemorrhoids.  She was last seen in October 2018.  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 Anthony Sar.  Patient has never had a postsurgical colonoscopy.  She previously was evaluated by medical oncologist Dr. Minette Headland, no adjuvant therapy offered.  She had to cancel her colonoscopy last year due to multiple deaths in the family. She is ready to get it scheduled.   Three weeks ago, a lot more rectal pain, hemorrhoids stage III per PCP. Tried Anusol suppositories for 5 days and didn't help a lot.  Cost her $100. Had tried multiple OTC agents. Still wiping blood from the hemorrhoids. Worse with cycle. BM most days, then may skip couple of days. Feels incomplete sometimes. Mucous with wiping. No persistent abdominal pain. No significant heartburn, N/V. No unintentional weight loss.    Current Outpatient Medications  Medication Sig Dispense Refill  . albuterol (PROAIR HFA) 108 (90 Base) MCG/ACT inhaler Inhale 2 puffs into the lungs every 4 (four) hours as needed.    Marland Kitchen amLODipine (NORVASC) 5 MG tablet Take 1 tablet (5 mg total) by mouth daily. 30 tablet 6  . amphetamine-dextroamphetamine (ADDERALL XR) 20 MG 24 hr capsule Take 1 capsule by mouth daily.    . fluticasone furoate-vilanterol (BREO ELLIPTA) 100-25 MCG/INH AEPB Inhale 1 puff into the lungs daily.    Marland Kitchen ibuprofen (ADVIL,MOTRIN) 200 MG tablet Take 200 mg by mouth every 6 (six) hours as needed.    Marland Kitchen losartan-hydrochlorothiazide (HYZAAR) 50-12.5 MG tablet Take 1 tablet by mouth daily.     . metoprolol succinate (TOPROL-XL) 50 MG 24 hr tablet Take 50 mg by mouth daily.  0  . nitroGLYCERIN (NITROSTAT) 0.4 MG SL  tablet every 5 (five) minutes as needed.     . topiramate (TOPAMAX) 50 MG tablet TAKE 1 TABLET BY MOUTH AT BEDTIME FOR MIGRAINE PREVENTION  0   No current facility-administered medications for this visit.     Allergies as of 05/07/2018  . (No Known Allergies)   Past Medical History:  Diagnosis Date  . Biliary acute pancreatitis 2010  . Colon cancer ([place]) 2016   Transverse colon   . Hypertension   . Migraine    Past Surgical History:  Procedure Laterality Date  . CHOLECYSTECTOMY  2010   [place], biliary pancreatitis  . COLECTOMY  09/30/2015   distal transverse d/t adenocarcinoma   Family History  Problem Relation Age of Onset  . Hypertension Mother   . Diabetes Mellitus II Mother   . Heart disease Sister        dx in late 21's  . Breast cancer Paternal Grandmother   . Breast cancer Paternal Aunt   . Colon cancer Neg Hx    Social History   Tobacco Use  . Smoking status: Never Smoker  . Smokeless tobacco: Never Used  Substance Use Topics  . Alcohol use: Yes    Comment: rare  . Drug use: No     ROS:  General: Negative for anorexia, weight loss, fever, chills, fatigue, weakness. ENT: Negative for hoarseness, difficulty swallowing , nasal congestion. CV: Negative for chest pain, angina, palpitations, dyspnea on exertion, peripheral  edema.  Respiratory: Negative for dyspnea at rest, dyspnea on exertion, cough, sputum, wheezing.  GI: See history of present illness. GU:  Negative for dysuria, hematuria, urinary incontinence, urinary frequency, nocturnal urination.  Endo: Negative for unusual weight change.    Physical Examination:   BP (!) 172/109   Pulse 82   Temp (!) 97 F (36.1 C) (Oral)   Ht 5\' 10"  (1.778 m)   Wt 277 lb (125.6 kg)   LMP 05/01/2018 (Approximate)   BMI 39.75 kg/m   General: Well-nourished, well-developed in no acute distress.  Eyes: No icterus. Mouth: Oropharyngeal mucosa moist and pink , no lesions erythema or exudate. Lungs: Clear to  auscultation bilaterally.  Heart: Regular rate and rhythm, no murmurs rubs or gallops.  Abdomen: Bowel sounds are normal, nontender, nondistended, no hepatosplenomegaly or masses, no abdominal bruits or hernia , no rebound or guarding.   RECTAL: Large external skin tag noted at 9:00, large hemorrhoid noted at 3:00, reducible but prolapses back out, stage III.  Palpable hemorrhoids within the anal canal slightly tender.  No frank blood.  No rectal masses. Extremities: No lower extremity edema. No clubbing or deformities. Neuro: Alert and oriented x 4   Skin: Warm and dry, no jaundice.   Psych: Alert and cooperative, normal mood and affect.

## 2018-05-07 NOTE — Patient Instructions (Addendum)
1. Colonoscopy as scheduled. See separate instructions.  2. Complete Anusol suppositories that you have at home, twice daily.  You may continue to use hemorrhoid cream externally as well. 3. Call in the interim with any questions or concerns.

## 2018-05-07 NOTE — Progress Notes (Signed)
CC'D TO PCP °

## 2018-05-10 ENCOUNTER — Other Ambulatory Visit: Payer: Self-pay | Admitting: Cardiovascular Disease

## 2018-06-10 ENCOUNTER — Telehealth: Payer: Self-pay

## 2018-06-10 NOTE — Telephone Encounter (Signed)
Called pt, TCS for tomorrow moved up to 12:45pm, arrive at 11:45am. Advised her to start drinking 2nd half of prep tomorrow at 7:45am, NPO after 9:45am. Called and informed endo scheduler.

## 2018-06-11 ENCOUNTER — Ambulatory Visit (HOSPITAL_COMMUNITY)
Admission: RE | Admit: 2018-06-11 | Discharge: 2018-06-11 | Disposition: A | Discharge status: Home / Self Care | Payer: Managed Care, Other (non HMO) | Source: Ambulatory Visit | Attending: Internal Medicine | Admitting: Internal Medicine

## 2018-06-11 ENCOUNTER — Other Ambulatory Visit: Payer: Self-pay

## 2018-06-11 ENCOUNTER — Encounter (HOSPITAL_COMMUNITY): Payer: Self-pay | Admitting: *Deleted

## 2018-06-11 ENCOUNTER — Encounter (HOSPITAL_COMMUNITY)
Admission: RE | Disposition: A | Discharge status: Home / Self Care | Payer: Self-pay | Source: Ambulatory Visit | Attending: Internal Medicine

## 2018-06-11 DIAGNOSIS — Z8249 Family history of ischemic heart disease and other diseases of the circulatory system: Secondary | ICD-10-CM | POA: Insufficient documentation

## 2018-06-11 DIAGNOSIS — Z1211 Encounter for screening for malignant neoplasm of colon: Secondary | ICD-10-CM | POA: Diagnosis not present

## 2018-06-11 DIAGNOSIS — Z7951 Long term (current) use of inhaled steroids: Secondary | ICD-10-CM | POA: Diagnosis not present

## 2018-06-11 DIAGNOSIS — Z803 Family history of malignant neoplasm of breast: Secondary | ICD-10-CM | POA: Insufficient documentation

## 2018-06-11 DIAGNOSIS — K625 Hemorrhage of anus and rectum: Secondary | ICD-10-CM

## 2018-06-11 DIAGNOSIS — Z79899 Other long term (current) drug therapy: Secondary | ICD-10-CM | POA: Diagnosis not present

## 2018-06-11 DIAGNOSIS — D125 Benign neoplasm of sigmoid colon: Secondary | ICD-10-CM | POA: Diagnosis not present

## 2018-06-11 DIAGNOSIS — K642 Third degree hemorrhoids: Secondary | ICD-10-CM | POA: Insufficient documentation

## 2018-06-11 DIAGNOSIS — Z833 Family history of diabetes mellitus: Secondary | ICD-10-CM | POA: Insufficient documentation

## 2018-06-11 DIAGNOSIS — G43909 Migraine, unspecified, not intractable, without status migrainosus: Secondary | ICD-10-CM | POA: Insufficient documentation

## 2018-06-11 DIAGNOSIS — Z85038 Personal history of other malignant neoplasm of large intestine: Secondary | ICD-10-CM

## 2018-06-11 DIAGNOSIS — Z980 Intestinal bypass and anastomosis status: Secondary | ICD-10-CM | POA: Insufficient documentation

## 2018-06-11 DIAGNOSIS — K644 Residual hemorrhoidal skin tags: Secondary | ICD-10-CM | POA: Diagnosis not present

## 2018-06-11 DIAGNOSIS — K649 Unspecified hemorrhoids: Secondary | ICD-10-CM

## 2018-06-11 DIAGNOSIS — Z9049 Acquired absence of other specified parts of digestive tract: Secondary | ICD-10-CM | POA: Diagnosis not present

## 2018-06-11 DIAGNOSIS — I10 Essential (primary) hypertension: Secondary | ICD-10-CM | POA: Diagnosis not present

## 2018-06-11 HISTORY — PX: COLONOSCOPY: SHX5424

## 2018-06-11 HISTORY — PX: POLYPECTOMY: SHX5525

## 2018-06-11 HISTORY — DX: Dyspnea, unspecified: R06.00

## 2018-06-11 SURGERY — COLONOSCOPY
Anesthesia: Moderate Sedation

## 2018-06-11 MED ORDER — MIDAZOLAM HCL 5 MG/5ML IJ SOLN
INTRAMUSCULAR | Status: DC | PRN
  Administered 2018-06-11 (×2): 2 mg via INTRAVENOUS
  Administered 2018-06-11 (×2): 1 mg via INTRAVENOUS

## 2018-06-11 MED ORDER — MIDAZOLAM HCL 5 MG/5ML IJ SOLN
INTRAMUSCULAR | Status: AC
  Filled 2018-06-11: qty 10

## 2018-06-11 MED ORDER — SODIUM CHLORIDE 0.9 % IV SOLN
INTRAVENOUS | Status: DC
  Administered 2018-06-11: 12:00:00 via INTRAVENOUS

## 2018-06-11 MED ORDER — STERILE WATER FOR IRRIGATION IR SOLN
Status: DC | PRN
  Administered 2018-06-11: 13:00:00

## 2018-06-11 MED ORDER — MEPERIDINE HCL 100 MG/ML IJ SOLN
INTRAMUSCULAR | Status: AC
  Filled 2018-06-11: qty 2

## 2018-06-11 MED ORDER — ONDANSETRON HCL 4 MG/2ML IJ SOLN
INTRAMUSCULAR | Status: AC
  Filled 2018-06-11: qty 2

## 2018-06-11 MED ORDER — MEPERIDINE HCL 100 MG/ML IJ SOLN
INTRAMUSCULAR | Status: DC | PRN
  Administered 2018-06-11: 50 mg via INTRAVENOUS
  Administered 2018-06-11 (×2): 25 mg via INTRAVENOUS

## 2018-06-11 MED ORDER — ONDANSETRON HCL 4 MG/2ML IJ SOLN
INTRAMUSCULAR | Status: DC | PRN
  Administered 2018-06-11: 4 mg via INTRAVENOUS

## 2018-06-11 NOTE — Progress Notes (Signed)
Patient states "I guess I was cleaned out enough". Advised patient MD was able to do procedure. Patient states "that is good".

## 2018-06-11 NOTE — Discharge Instructions (Signed)
Colonoscopy Discharge Instructions  Read the instructions outlined below and refer to this sheet in the next few weeks. These discharge instructions provide you with general information on caring for yourself after you leave the hospital. Your doctor may also give you specific instructions. While your treatment has been planned according to the most current medical practices available, unavoidable complications occasionally occur. If you have any problems or questions after discharge, call Dr. Gala Romney at [phone]. ACTIVITY  You may resume your regular activity, but move at a slower pace for the next 24 hours.   Take frequent rest periods for the next 24 hours.   Walking will help get rid of the air and reduce the bloated feeling in your belly (abdomen).   No driving for 24 hours (because of the medicine (anesthesia) used during the test).    Do not sign any important legal documents or operate any machinery for 24 hours (because of the anesthesia used during the test).  NUTRITION  Drink plenty of fluids.   You may resume your normal diet as instructed by your doctor.   Begin with a light meal and progress to your normal diet. Heavy or fried foods are harder to digest and may make you feel sick to your stomach (nauseated).   Avoid alcoholic beverages for 24 hours or as instructed.  MEDICATIONS  You may resume your normal medications unless your doctor tells you otherwise.  WHAT YOU CAN EXPECT TODAY  Some feelings of bloating in the abdomen.   Passage of more gas than usual.   Spotting of blood in your stool or on the toilet paper.  IF YOU HAD POLYPS REMOVED DURING THE COLONOSCOPY:  No aspirin products for 7 days or as instructed.   No alcohol for 7 days or as instructed.   Eat a soft diet for the next 24 hours.  FINDING OUT THE RESULTS OF YOUR TEST Not all test results are available during your visit. If your test results are not back during the visit, make an appointment  with your caregiver to find out the results. Do not assume everything is normal if you have not heard from your caregiver or the medical facility. It is important for you to follow up on all of your test results.  SEEK IMMEDIATE MEDICAL ATTENTION IF:  You have more than a spotting of blood in your stool.   Your belly is swollen (abdominal distention).   You are nauseated or vomiting.   You have a temperature over 101.   You have abdominal pain or discomfort that is severe or gets worse throughout the day.   Hemorrhoid and colon polyp information provided  Pamphlet on hemorrhoids banding provided  Office visit with [place] in 3 months  Further recommendations to follow pending review of pathology report    Hemorrhoids Hemorrhoids are swollen veins in and around the rectum or anus. There are two types of hemorrhoids:  Internal hemorrhoids. These occur in the veins that are just inside the rectum. They may poke through to the outside and become irritated and painful.  External hemorrhoids. These occur in the veins that are outside of the anus and can be felt as a painful swelling or hard lump near the anus.  Most hemorrhoids do not cause serious problems, and they can be managed with home treatments such as diet and lifestyle changes. If home treatments do not help your symptoms, procedures can be done to shrink or remove the hemorrhoids. What are the causes? This condition  is caused by increased pressure in the anal area. This pressure may result from various things, including:  Constipation.  Straining to have a bowel movement.  Diarrhea.  Pregnancy.  Obesity.  Sitting for long periods of time.  Heavy lifting or other activity that causes you to strain.  Anal sex.  What are the signs or symptoms? Symptoms of this condition include:  Pain.  Anal itching or irritation.  Rectal bleeding.  Leakage of stool (feces).  Anal swelling.  One or more lumps around the  anus.  How is this diagnosed? This condition can often be diagnosed through a visual exam. Other exams or tests may also be done, such as:  Examination of the rectal area with a gloved hand (digital rectal exam).  Examination of the anal canal using a small tube (anoscope).  A blood test, if you have lost a significant amount of blood.  A test to look inside the colon (sigmoidoscopy or colonoscopy).  How is this treated? This condition can usually be treated at home. However, various procedures may be done if dietary changes, lifestyle changes, and other home treatments do not help your symptoms. These procedures can help make the hemorrhoids smaller or remove them completely. Some of these procedures involve surgery, and others do not. Common procedures include:  Rubber band ligation. Rubber bands are placed at the base of the hemorrhoids to cut off the blood supply to them.  Sclerotherapy. Medicine is injected into the hemorrhoids to shrink them.  Infrared coagulation. A type of light energy is used to get rid of the hemorrhoids.  Hemorrhoidectomy surgery. The hemorrhoids are surgically removed, and the veins that supply them are tied off.  Stapled hemorrhoidopexy surgery. A circular stapling device is used to remove the hemorrhoids and use staples to cut off the blood supply to them.  Follow these instructions at home: Eating and drinking  Eat foods that have a lot of fiber in them, such as whole grains, beans, nuts, fruits, and vegetables. Ask your health care provider about taking products that have added fiber (fiber supplements).  Drink enough fluid to keep your urine clear or pale yellow. Managing pain and swelling  Take warm sitz baths for 20 minutes, 3-4 times a day to ease pain and discomfort.  If directed, apply ice to the affected area. Using ice packs between sitz baths may be helpful. ? Put ice in a plastic bag. ? Place a towel between your skin and the  bag. ? Leave the ice on for 20 minutes, 2-3 times a day. General instructions  Take over-the-counter and prescription medicines only as told by your health care provider.  Use medicated creams or suppositories as told.  Exercise regularly.  Go to the bathroom when you have the urge to have a bowel movement. Do not wait.  Avoid straining to have bowel movements.  Keep the anal area dry and clean. Use wet toilet paper or moist towelettes after a bowel movement.  Do not sit on the toilet for long periods of time. This increases blood pooling and pain. Contact a health care provider if:  You have increasing pain and swelling that are not controlled by treatment or medicine.  You have uncontrolled bleeding.  You have difficulty having a bowel movement, or you are unable to have a bowel movement.  You have pain or inflammation outside the area of the hemorrhoids. This information is not intended to replace advice given to you by your health care provider. Make sure  you discuss any questions you have with your health care provider. Document Released: 12/08/2000 Document Revised: 05/10/2016 Document Reviewed: 08/25/2015 Elsevier Interactive Patient Education  2018 [employer].    Colon Polyps Polyps are tissue growths inside the body. Polyps can grow in many places, including the large intestine (colon). A polyp may be a round bump or a mushroom-shaped growth. You could have one polyp or several. Most colon polyps are noncancerous (benign). However, some colon polyps can become cancerous over time. What are the causes? The exact cause of colon polyps is not known. What increases the risk? This condition is more likely to develop in people who:  Have a family history of colon cancer or colon polyps.  Are older than 72 or older than 45 if they are African American.  Have inflammatory bowel disease, such as ulcerative colitis or Crohn disease.  Are overweight.  Smoke  cigarettes.  Do not get enough exercise.  Drink too much alcohol.  Eat a diet that is: ? High in fat and red meat. ? Low in fiber.  Had childhood cancer that was treated with abdominal radiation.  What are the signs or symptoms? Most polyps do not cause symptoms. If you have symptoms, they may include:  Blood coming from your rectum when having a bowel movement.  Blood in your stool.The stool may look dark red or black.  A change in bowel habits, such as constipation or diarrhea.  How is this diagnosed? This condition is diagnosed with a colonoscopy. This is a procedure that uses a lighted, flexible scope to look at the inside of your colon. How is this treated? Treatment for this condition involves removing any polyps that are found. Those polyps will then be tested for cancer. If cancer is found, your health care provider will talk to you about options for colon cancer treatment. Follow these instructions at home: Diet  Eat plenty of fiber, such as fruits, vegetables, and whole grains.  Eat foods that are high in calcium and vitamin D, such as milk, cheese, yogurt, eggs, liver, fish, and broccoli.  Limit foods high in fat, red meats, and processed meats, such as hot dogs, sausage, bacon, and lunch meats.  Maintain a healthy weight, or lose weight if recommended by your health care provider. General instructions  Do not smoke cigarettes.  Do not drink alcohol excessively.  Keep all follow-up visits as told by your health care provider. This is important. This includes keeping regularly scheduled colonoscopies. Talk to your health care provider about when you need a colonoscopy.  Exercise every day or as told by your health care provider. Contact a health care provider if:  You have new or worsening bleeding during a bowel movement.  You have new or increased blood in your stool.  You have a change in bowel habits.  You unexpectedly lose weight. This information  is not intended to replace advice given to you by your health care provider. Make sure you discuss any questions you have with your health care provider. Document Released: 09/06/2004 Document Revised: 05/18/2016 Document Reviewed: 11/01/2015 Elsevier Interactive Patient Education  [employer].

## 2018-06-11 NOTE — H&P (Signed)
@LOGO @   Primary Care Physician:  Allwardt, Alyssa, PA Primary Gastroenterologist:  Dr. Gala Romney  Pre-Procedure History & Physical: HPI:  Shelly Larson is a 47 y.o. female here for surveillance colonoscopy. History of the stage II colon cancer status post segmental resection back in 2016 no follow-up colonoscopy as of yet. She is here to get that done.  Past Medical History:  Diagnosis Date  . Biliary acute pancreatitis 2010  . Colon cancer ([place]) 2016   Transverse colon   . Dyspnea   . Hypertension   . Migraine     Past Surgical History:  Procedure Laterality Date  . CHOLECYSTECTOMY  2010   [place], biliary pancreatitis  . COLECTOMY  09/30/2015   distal transverse d/t adenocarcinoma    Prior to Admission medications   Medication Sig Start Date End Date Taking? Authorizing Provider  amLODipine (NORVASC) 5 MG tablet TAKE 1 TABLET BY MOUTH EVERY DAY 05/10/18  Yes Herminio Commons, MD  amphetamine-dextroamphetamine (ADDERALL XR) 20 MG 24 hr capsule Take 20 mg by mouth daily.  05/03/18  Yes [provider]  fluticasone furoate-vilanterol (BREO ELLIPTA) 100-25 MCG/INH AEPB Inhale 1 puff into the lungs daily.   Yes [provider]  ibuprofen (ADVIL,MOTRIN) 200 MG tablet Take 400 mg by mouth every 6 (six) hours as needed.    Yes [provider]  losartan-hydrochlorothiazide (HYZAAR) 50-12.5 MG tablet Take 1 tablet by mouth daily.    Yes [provider]  metoprolol succinate (TOPROL-XL) 50 MG 24 hr tablet Take 50 mg by mouth daily. 09/19/17  Yes [provider]  Na Sulfate-K Sulfate-Mg Sulf (SUPREP BOWEL PREP KIT) 17.5-3.13-1.6 GM/177ML SOLN Take 1 kit by mouth as directed. 05/07/18  Yes Rourk, Cristopher Estimable, MD  topiramate (TOPAMAX) 50 MG tablet TAKE 1 TABLET BY MOUTH AT BEDTIME FOR MIGRAINE PREVENTION 09/19/17  Yes [provider]  albuterol (PROAIR HFA) 108 (90 Base) MCG/ACT inhaler Inhale 2 puffs into the lungs every 4 (four) hours as  needed. 10/17/17   [provider]  nitroGLYCERIN (NITROSTAT) 0.4 MG SL tablet Place 0.4 mg under the tongue every 5 (five) minutes as needed.  08/21/17   [provider]    Allergies as of 05/07/2018  . (No Known Allergies)    Family History  Problem Relation Age of Onset  . Hypertension Mother   . Diabetes Mellitus II Mother   . Heart disease Sister        dx in late 67's  . Breast cancer Paternal Grandmother   . Breast cancer Paternal Aunt   . Colon cancer Neg Hx     Social History   Socioeconomic History  . Marital status: Single    Spouse name: Not on file  . Number of children: Not on file  . Years of education: Not on file  . Highest education level: Not on file  Occupational History  . Occupation: Public affairs consultant  Social Needs  . Financial resource strain: Not on file  . Food insecurity:    Worry: Not on file    Inability: Not on file  . Transportation needs:    Medical: Not on file    Non-medical: Not on file  Tobacco Use  . Smoking status: Never Smoker  . Smokeless tobacco: Never Used  Substance and Sexual Activity  . Alcohol use: Yes    Comment: rare  . Drug use: No  . Sexual activity: Not on file  Lifestyle  . Physical activity:    Days  per week: Not on file    Minutes per session: Not on file  . Stress: Not on file  Relationships  . Social connections:    Talks on phone: Not on file    Gets together: Not on file    Attends religious service: Not on file    Active member of club or organization: Not on file    Attends meetings of clubs or organizations: Not on file    Relationship status: Not on file  . Intimate partner violence:    Fear of current or ex partner: Not on file    Emotionally abused: Not on file    Physically abused: Not on file    Forced sexual activity: Not on file  Other Topics Concern  . Not on file  Social History Narrative  . Not on file    Review of Systems: See HPI, otherwise negative  ROS  Physical Exam: BP (!) 137/93   Pulse 77   Temp 98.3 F (36.8 C) (Oral)   Resp 14   Ht 5' 11"  (1.803 m)   Wt 270 lb (122.5 kg)   LMP 05/28/2018   SpO2 100%   BMI 37.66 kg/m  General:   Alert,  Well-developed, well-nourished, pleasant and cooperative in NAD Neck:  Supple; no masses or thyromegaly. No significant cervical adenopathy. Lungs:  Clear throughout to auscultation.   No wheezes, crackles, or rhonchi. No acute distress. Heart:  Regular rate and rhythm; no murmurs, clicks, rubs,  or gallops. Abdomen: Non-distended, normal bowel sounds.  Soft and nontender without appreciable mass or hepatosplenomegaly.  Pulses:  Normal pulses noted. Extremities:  Without clubbing or edema  Impression:   Patient with stage II colon cancer status post segmental colectomy. Here for surveillance colonoscopy. Patient is bothered by hemorrhoids from time to time. Increased mucus and occasional paper hematochezia.   Recommendations: I have offered the patient a surveillance colonoscopy today per plan.  The risks, benefits, limitations, alternatives and imponderables have been reviewed with the patient. Questions have been answered. All parties are agreeable.   Notice: This dictation was prepared with Dragon dictation along with smaller phrase technology. Any transcriptional errors that result from this process are unintentional and may not be corrected upon review.

## 2018-06-11 NOTE — Op Note (Signed)
[facility] Patient Name: Shelly Larson Procedure Date: 06/11/2018 12:16 PM MRN: [id number] Date of Birth: Apr 26, 1971 Attending MD: Norvel Richards , MD CSN: [id number] Age: 47 Admit Type: Outpatient Procedure:                Colonoscopy Indications:              High risk colon cancer surveillance: Personal                            history of colon cancer Providers:                Norvel Richards, MD, Otis Peak B. Gwenlyn Perking RN, RN,                            Aram Candela Referring MD:              Medicines:                Midazolam 6 mg IV, Meperidine 100 mg IV,                            Ondansetron 4 mg IV Complications:            No immediate complications. Estimated Blood Loss:     Estimated blood loss was minimal. Procedure:                Pre-Anesthesia Assessment:                           - Prior to the procedure, a History and Physical                            was performed, and patient medications and                            allergies were reviewed. The patient's tolerance of                            previous anesthesia was also reviewed. The risks                            and benefits of the procedure and the sedation                            options and risks were discussed with the patient.                            All questions were answered, and informed consent                            was obtained. Prior Anticoagulants: The patient has                            taken no previous anticoagulant or antiplatelet  agents. ASA Grade Assessment: II - A patient with                            mild systemic disease. After reviewing the risks                            and benefits, the patient was deemed in                            satisfactory condition to undergo the procedure.                           After obtaining informed consent, the colonoscope                            was passed under direct vision.  Throughout the                            procedure, the patient's blood pressure, pulse, and                            oxygen saturations were monitored continuously. The                            EC-[id number] ([id number]) scope was introduced through                            the anus and advanced to the the cecum, identified                            by appendiceal orifice and ileocecal valve. The                            colonoscopy was performed without difficulty. The                            patient tolerated the procedure well. The quality                            of the bowel preparation was adequate. The                            ileocecal valve, appendiceal orifice, and rectum                            were photographed. The entire colon was well                            visualized. Scope In: 01:02:72 PM Scope Out: 12:49:21 PM Scope Withdrawal Time: 0 hours 9 minutes 44 seconds  Total Procedure Duration: 0 hours 12 minutes 33 seconds  Findings:      Hemorrhoids were found on perianal exam.      A 5 mm polyp was found in the sigmoid colon. The polyp  was       semi-pedunculated. The polyp was removed with a cold snare. Resection       and retrieval were complete. Estimated blood loss: none.       Normal-appearing surgical anastomosis approximately mid colon.      External and internal hemorrhoids were found during digital exam. The       hemorrhoids were Grade III (internal hemorrhoids that prolapse but       require manual reduction).      The exam was otherwise without abnormality on direct and retroflexion       views. Impression:               - Hemorrhoids found on perianal exam. Grade III                           - One 5 mm polyp in the sigmoid colon, removed with                            a cold snare. Resected and retrieved. Status post                            segmental resection                           - External and internal hemorrhoids.                            - The examination was otherwise normal on direct                            and retroflexion views. Moderate Sedation:      Moderate (conscious) sedation was administered by the endoscopy nurse       and supervised by the endoscopist. The following parameters were       monitored: oxygen saturation, heart rate, blood pressure, respiratory       rate, EKG, adequacy of pulmonary ventilation, and response to care.       Total physician intraservice time was 18 minutes. Recommendation:           - Patient has a contact number available for                            emergencies. The signs and symptoms of potential                            delayed complications were discussed with the                            patient. Return to normal activities tomorrow.                            Written discharge instructions were provided to the                            patient.                           -  Advance diet as tolerated.                           - Continue present medications.                           - Repeat colonoscopy date to be determined after                            pending pathology results are reviewed for                            surveillance. Patient complains of symptomatic                            hemorrhoids. She may benefit from hemorrhoid                            banding. Pamphlet on banding provided.                           - Return to GI office in 3 months. Procedure Code(s):        --- Professional ---                           [phone], Colonoscopy, flexible; with removal of                            tumor(s), polyp(s), or other lesion(s) by snare                            technique                           G0500, Moderate sedation services provided by the                            same physician or other qualified health care                            professional performing a gastrointestinal                            endoscopic service  that sedation supports,                            requiring the presence of an independent trained                            observer to assist in the monitoring of the                            patient's level of consciousness and physiological                            status; initial  15 minutes of intra-service time;                            patient age 34 years or older (additional time may                            be reported with [phone], as appropriate) Diagnosis Code(s):        --- Professional ---                           [phone], Personal history of other malignant                            neoplasm of large intestine                           K64.2, Third degree hemorrhoids                           D12.5, Benign neoplasm of sigmoid colon CPT copyright 2017 American Medical Association. All rights reserved. The codes documented in this report are preliminary and upon coder review may  be revised to meet current compliance requirements. Cristopher Estimable. Braeden Dolinski, MD Norvel Richards, MD 06/11/2018 12:58:41 PM This report has been signed electronically. Number of Addenda: 0

## 2018-06-11 NOTE — Progress Notes (Signed)
Patient "someone stole my unicorn hatchlings". Dr. Gala Romney aware. Patient alert, oriented x person, place, time, patient able to read clock "1:15 in the afternoon".  Reoriented patient.  Patient states  "I have hemorrhoids".

## 2018-06-16 ENCOUNTER — Encounter: Payer: Self-pay | Admitting: Internal Medicine

## 2018-06-17 ENCOUNTER — Encounter (HOSPITAL_COMMUNITY): Payer: Self-pay | Admitting: Internal Medicine

## 2018-07-09 ENCOUNTER — Ambulatory Visit (INDEPENDENT_AMBULATORY_CARE_PROVIDER_SITE_OTHER): Payer: Managed Care, Other (non HMO) | Admitting: "Endocrinology

## 2018-07-09 ENCOUNTER — Encounter: Payer: Self-pay | Admitting: "Endocrinology

## 2018-07-09 VITALS — BP 156/84 | HR 70 | Ht 71.0 in | Wt 270.0 lb

## 2018-07-09 DIAGNOSIS — Z6837 Body mass index (BMI) 37.0-37.9, adult: Secondary | ICD-10-CM

## 2018-07-09 DIAGNOSIS — I10 Essential (primary) hypertension: Secondary | ICD-10-CM

## 2018-07-09 DIAGNOSIS — R7303 Prediabetes: Secondary | ICD-10-CM

## 2018-07-09 DIAGNOSIS — E782 Mixed hyperlipidemia: Secondary | ICD-10-CM | POA: Diagnosis not present

## 2018-07-09 MED ORDER — METFORMIN HCL 500 MG PO TABS: 500.0000 mg | ORAL_TABLET | Freq: Two times a day (BID) | ORAL | 2 refills | Status: DC

## 2018-07-09 NOTE — Progress Notes (Signed)
Endocrinology Consult Note       07/09/2018, 5:49 PM   Subjective:    Patient ID: Shelly Larson, female    DOB: 1970/12/27.  Shelly Larson is being seen in consultation for demented and recent diagnosis of prediabetes requested by  Allwardt, Alyssa, PA.   Past Medical History:  Diagnosis Date  . Biliary acute pancreatitis 2010  . 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 Dyspnea   . Hypertension   . Migraine    Past Surgical History:  Procedure Laterality Date  . CHOLECYSTECTOMY  2010   [place], biliary pancreatitis  . COLECTOMY  09/30/2015   distal transverse d/t adenocarcinoma  . COLONOSCOPY N/A 06/11/2018   Procedure: COLONOSCOPY;  Surgeon: Daneil Dolin, MD;  [facility];  Service: Endoscopy;  Laterality: N/A;  1:45pm  . POLYPECTOMY  06/11/2018   Procedure: POLYPECTOMY;  Surgeon: Daneil Dolin, MD;  Location: [facility];  Service: Endoscopy;;  colon   Social History   Socioeconomic History  . Marital status: Single    Spouse name: Not on file  . Number of children: Not on file  . Years of education: Not on file  . Highest education level: Not on file  Occupational History  . Occupation: Public affairs consultant  Social Needs  . Financial resource strain: Not on file  . Food insecurity:    Worry: Not on file    Inability: Not on file  . Transportation needs:    Medical: Not on file    Non-medical: Not on file  Tobacco Use  . Smoking status: Never Smoker  . Smokeless tobacco: Never Used  Substance and Sexual Activity  . Alcohol use: Yes    Comment: rare  . Drug use: No  . Sexual activity: Not on file  Lifestyle  . Physical activity:    Days per week: Not on file    Minutes per session: Not on file  . Stress: Not on file  Relationships  . Social connections:    Talks on phone: Not on file    Gets together: Not on file    Attends religious service: Not on  file    Active member of club or organization: Not on file    Attends meetings of clubs or organizations: Not on file    Relationship status: Not on file  Other Topics Concern  . Not on file  Social History Narrative  . Not on file   Outpatient Encounter Medications as of 07/09/2018  Medication Sig  . albuterol (PROAIR HFA) 108 (90 Base) MCG/ACT inhaler Inhale 2 puffs into the lungs every 4 (four) hours as needed.  Marland Kitchen amLODipine (NORVASC) 5 MG tablet TAKE 1 TABLET BY MOUTH EVERY DAY  . amphetamine-dextroamphetamine (ADDERALL XR) 20 MG 24 hr capsule Take 20 mg by mouth daily.   . fluticasone furoate-vilanterol (BREO ELLIPTA) 100-25 MCG/INH AEPB Inhale 1 puff into the lungs daily.  Marland Kitchen losartan-hydrochlorothiazide (HYZAAR) 50-12.5 MG tablet Take 1 tablet by mouth daily.   . metoprolol succinate (TOPROL-XL) 50 MG 24 hr tablet Take 50 mg by mouth daily.  Marland Kitchen  topiramate (TOPAMAX) 50 MG tablet TAKE 1 TABLET BY MOUTH AT BEDTIME FOR MIGRAINE PREVENTION  . metFORMIN (GLUCOPHAGE) 500 MG tablet Take 1 tablet (500 mg total) by mouth 2 (two) times daily with a meal.  . nitroGLYCERIN (NITROSTAT) 0.4 MG SL tablet Place 0.4 mg under the tongue every 5 (five) minutes as needed.   . [DISCONTINUED] ibuprofen (ADVIL,MOTRIN) 200 MG tablet Take 400 mg by mouth every 6 (six) hours as needed.   . [DISCONTINUED] Na Sulfate-K Sulfate-Mg Sulf (SUPREP BOWEL PREP KIT) 17.5-3.13-1.6 GM/177ML SOLN Take 1 kit by mouth as directed.   No facility-administered encounter medications on file as of 07/09/2018.     ALLERGIES: No Known Allergies  VACCINATION STATUS:  There is no immunization history on file for this patient.  Other  This is a new (She is recently diagnosed with prediabetes with A1c of 6.1% on May 03, 2018.) problem. The current episode started more than 1 month ago. The problem has been unchanged. Pertinent negatives include no arthralgias, chest pain, chills, coughing, fatigue, fever, headaches, myalgias,  nausea, rash or vomiting.  Hypertension  This is a chronic problem. The current episode started more than 1 year ago. Pertinent negatives include no chest pain, headaches, palpitations or shortness of breath. Risk factors for coronary artery disease include obesity and sedentary lifestyle. Past treatments include angiotensin blockers, diuretics and calcium channel blockers.   She is not on any treatment for prediabetes.  She denies any prior history of pregnancy nor gestational diabetes.  She noticed that she was craving for processed carbs.  She has lost 7 pounds since May, and would like to achieve more weight loss.  She has struggled with weight control for which she tried various medications including phentermine, and recently on Topamax .  She has been overweight or obese most of her adult life.  Reports body weight of 150 to 160 pounds at the end of her high school. - She is a survivor of colon cancer status post resection in September 2016.  Review of Systems  Constitutional: Negative for chills, fatigue, fever and unexpected weight change.  HENT: Negative for trouble swallowing and voice change.   Eyes: Negative for visual disturbance.  Respiratory: Negative for cough, shortness of breath and wheezing.   Cardiovascular: Negative for chest pain, palpitations and leg swelling.  Gastrointestinal: Negative for diarrhea, nausea and vomiting.  Endocrine: Negative for cold intolerance, heat intolerance, polydipsia, polyphagia and polyuria.  Musculoskeletal: Negative for arthralgias and myalgias.  Skin: Negative for color change, pallor, rash and wound.  Neurological: Negative for seizures and headaches.  Psychiatric/Behavioral: Negative for confusion and suicidal ideas.    Objective:    BP (!) 156/84   Pulse 70   Ht 5' 11"  (1.803 m)   Wt 270 lb (122.5 kg)   BMI 37.66 kg/m   Wt Readings from Last 3 Encounters:  07/09/18 270 lb (122.5 kg)  06/11/18 270 lb (122.5 kg)  05/07/18 277 lb  (125.6 kg)     Physical Exam  Constitutional: She is oriented to person, place, and time. She appears well-developed.  HENT:  Head: Normocephalic and atraumatic.  Eyes: EOM are normal.  Neck: Normal range of motion. Neck supple. No tracheal deviation present. No thyromegaly present.  Cardiovascular: Normal rate and regular rhythm.  Pulmonary/Chest: Effort normal and breath sounds normal.  Abdominal: Soft. Bowel sounds are normal. There is no tenderness. There is no guarding.  Musculoskeletal: Normal range of motion. She exhibits no edema.  Neurological: She is alert  and oriented to person, place, and time. She has normal reflexes. No cranial nerve deficit. Coordination normal.  Skin: Skin is warm and dry. No rash noted. No erythema. No pallor.  Significant acanthosis nigricans.  Psychiatric: She has a normal mood and affect. Judgment normal.      CMP ( most recent) CMP     Component Value Date/Time   NA 138 11/21/2010 2043   K 3.1 (L) 11/21/2010 2043   CL 99 11/21/2010 2043   CO2 30 11/10/2010 2036   GLUCOSE 107 (H) 11/21/2010 2043   BUN 9 05/03/2018   CREATININE 0.8 05/03/2018   CREATININE 0.9 11/21/2010 2043   CALCIUM 9.0 11/10/2010 2036   PROT 7.2 11/10/2010 2036   ALBUMIN 3.8 11/10/2010 2036   AST 16 11/10/2010 2036   ALT 20 11/10/2010 2036   ALKPHOS 53 11/10/2010 2036   BILITOT 0.6 11/10/2010 2036   GFRNONAA >60 11/10/2010 2036   GFRAA  11/10/2010 2036    >60        The eGFR has been calculated using the MDRD equation. This calculation has not been validated in all clinical situations. eGFR's persistently <60 mL/min signify possible Chronic Kidney Disease.     Diabetic Labs (most recent): Lab Results  Component Value Date   HGBA1C 6.1 05/03/2018     Lipid Panel ( most recent) Lipid Panel     Component Value Date/Time   CHOL 134 05/03/2018   TRIG 141 05/03/2018   HDL 39 05/03/2018   LDLCALC 67 05/03/2018      Lab Results  Component Value  Date   TSH 1.09 05/03/2018      Assessment & Plan:   1. Prediabetes -She has clinical findings consistent with insulin resistance. - Shelly Larson has recently been diagnosed with prediabetes with A1c of 6.1%.  She is not on any medications.  Recent labs reviewed. -She does not report any complications, however Shelly Larson remains at a high risk for more acute and chronic complications which include CAD, CVA, CKD, retinopathy, and neuropathy. These are all discussed in detail with the patient.  - I have counseled her on diet management and weight loss, by adopting a carbohydrate restricted/protein rich diet.  - I encouraged her to switch to  unprocessed or minimally processed complex starch and increased protein intake (animal or plant source), fruits, and vegetables.  - she is advised to stick to a routine mealtimes to eat 3 meals  a day and avoid unnecessary snacks ( to snack only to correct hypoglycemia).   - she will be scheduled with Jearld Fenton, RDN, CDE for individualized diabetes education.  - I have approached her with the following individualized plan to manage diabetes and patient agrees:   -She would benefit from early initiation of metformin treatment.  I discussed and initiated metformin 500 mg p.o. twice daily- daily after breakfast and after supper.     For her related to weight problem with BMI of 37.6 :  - Suggestion is made for her to avoid simple carbohydrates  from her diet including Cakes, Sweet Desserts, Ice Cream, Soda (diet and regular), Sweet Tea, Candies, Chips, Cookies, Store Bought Juices, Alcohol in Excess of  1-2 drinks a day, Artificial Sweeteners, and "Sugar-free" Products. This will help patient to have stable blood glucose profile and potentially avoid unintended weight gain.  - Patient specific target  A1c;  LDL, HDL, Triglycerides, and  Waist Circumference were discussed in detail.  2) BP/HTN: Her blood pressure is  not controlled to target,  patient cites some work-related stress this last couple of days.  She is advised to continue her current medications including amlodipine 5 mg p.o. daily, losartan/HCTZ 25-12.5 mg daily, metoprolol 50 mg p.o. daily.   -Her medication dose will be adjusted to proper doses next visit if her blood pressure remains uncontrolled.   3) Lipids/HPL:   Her recent lipid panel is consistent with good control with LDL of 67.  She is not on statin medications.  4)  Weight/Diet: See above, CDE Consult will be initiated , exercise, and detailed carbohydrates information provided. -If her weight loss progress is not satisfactory, she will be considered for 1 of the recently approved weight loss medications if her insurance provides coverage.  She is also a candidate for bariatric surgery as  the third and last option.  5) Chronic Care/Health Maintenance:  -she is advised to stay away from smoking ; moderate intensity exercise for up to 150 minutes weekly; and  sleep for at least 7 hours a day.  - I advised patient to maintain close follow up with Allwardt, Alyssa, PA for primary care needs.  - Time spent with the patient: 45 minutes, of which >50% was spent in obtaining information about her symptoms, reviewing her previous labs, evaluations, and treatments, counseling her about her obesity, prediabetes, hypertension, and developing developing  plans for long term treatment based on the latest recommendations.  Shelly Larson participated in the discussions, expressed understanding, and voiced agreement with the above plans.  All questions were answered to her satisfaction. she is encouraged to contact clinic should she have any questions or concerns prior to her return visit.  Follow up plan: - Return in about 3 months (around 10/09/2018) for follow up with pre-visit labs.  Glade Lloyd, MD [facility] [address] [place], [place] [address] Phone:  [phone]  Fax: [phone]    07/09/2018, 5:49 PM  This note was partially dictated with voice recognition software. Similar sounding words can be transcribed inadequately or may not  be corrected upon review.

## 2018-07-09 NOTE — Patient Instructions (Signed)

## 2018-07-22 ENCOUNTER — Ambulatory Visit: Payer: Managed Care, Other (non HMO) | Admitting: Obstetrics and Gynecology

## 2018-07-22 ENCOUNTER — Encounter: Payer: Self-pay | Admitting: Obstetrics and Gynecology

## 2018-07-22 VITALS — BP 166/99 | HR 76 | Ht 71.0 in | Wt 269.4 lb

## 2018-07-22 DIAGNOSIS — Z9889 Other specified postprocedural states: Secondary | ICD-10-CM | POA: Diagnosis not present

## 2018-07-22 DIAGNOSIS — N921 Excessive and frequent menstruation with irregular cycle: Secondary | ICD-10-CM | POA: Diagnosis not present

## 2018-07-22 DIAGNOSIS — D259 Leiomyoma of uterus, unspecified: Secondary | ICD-10-CM

## 2018-07-22 DIAGNOSIS — Z302 Encounter for sterilization: Secondary | ICD-10-CM

## 2018-07-22 NOTE — Progress Notes (Signed)
Patient ID: Shelly Larson, female   DOB: 1971-08-20, 47 y.o.   MRN: [id number]    [facility] Visit  @DATE @            Patient name: Shelly Larson MRN [id number]  Date of birth: [birth year]  CC & HPI:  Shelly Larson is a 47 y.o. female presenting today for heavy periods and has history of menorrhagia. Dr. Evie Lacks placed Mirena and was bleeding heavily worser than before. She wanted to remove it but couldn't find it. She scheduled for tubal ligation and removal of Mirena with Dr. Barrie Dunker. She did not have tubal due to colon resection and IUD was not in uterus or abdomen.  Was prescribed tranexamic acid. Medicine makes clots for her and makes her period last for 11 days for first part of the month of July and she started bleeding last Tuesday 07/16/18 and is unsure if her period is over.   She has no birthcontrol method and is sexually active.  She has also been told that she is premenopausal.   She has an upper abdomen colon resection. ROS:  ROS +heavy vaginal bleeding +uterine fibroids +heart-shaped uterus -fever -chills All systems are negative except as noted in the HPI and PMH.   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[place]) 2016   Transverse colon    Dyspnea    Hypertension    Migraine                               Surgical Hx:    Past Surgical History:  Procedure Laterality Date   CHOLECYSTECTOMY  2010   [place], biliary pancreatitis   COLECTOMY  09/30/2015   distal transverse d/t adenocarcinoma   COLONOSCOPY N/A 06/11/2018   Procedure: COLONOSCOPY;  Surgeon: Daneil Dolin, MD;  [facility];  Service: Endoscopy;  Laterality: N/A;  1:45pm   POLYPECTOMY  06/11/2018   Procedure: POLYPECTOMY;  Surgeon: Daneil Dolin, MD;  [facility];   Service: Endoscopy;;  colon   Medications: Reviewed & Updated - see associated section                       Current Outpatient Medications:    albuterol (PROAIR HFA) 108 (90 Base) MCG/ACT inhaler, Inhale 2 puffs into the lungs every 4 (four) hours as needed., Disp: , Rfl:    amLODipine (NORVASC) 5 MG tablet, TAKE 1 TABLET BY MOUTH EVERY DAY, Disp: 30 tablet, Rfl: 2   amphetamine-dextroamphetamine (ADDERALL XR) 20 MG 24 hr capsule, Take 20 mg by mouth daily. , Disp: , Rfl:    fluticasone furoate-vilanterol (BREO ELLIPTA) 100-25 MCG/INH AEPB, Inhale 1 puff into the lungs daily., Disp: , Rfl:    losartan-hydrochlorothiazide (HYZAAR) 50-12.5 MG tablet, Take 1 tablet by mouth daily. , Disp: , Rfl:    metFORMIN (GLUCOPHAGE) 500 MG tablet, Take 1 tablet (500 mg total) by mouth 2 (two) times daily with a meal., Disp: 60 tablet, Rfl: 2   metoprolol succinate (TOPROL-XL) 50 MG 24 hr tablet, Take 50 mg by mouth daily., Disp: , Rfl: 0   nitroGLYCERIN (NITROSTAT) 0.4 MG SL tablet, Place 0.4 mg under the tongue every 5 (five)  minutes as needed. , Disp: , Rfl:    topiramate (TOPAMAX) 50 MG tablet, TAKE 1 TABLET BY MOUTH AT BEDTIME FOR MIGRAINE PREVENTION, Disp: , Rfl: 0   Social History: Reviewed -  reports that she has never smoked. She has never used smokeless tobacco.  Objective Findings:  Vitals: Blood pressure (!) 166/99, pulse 76, height 5\' 11"  (1.803 m), weight 269 lb 6.4 oz (122.2 kg), last menstrual period 07/16/2018.  PHYSICAL EXAMINATION General appearance - alert, well appearing, and in no distress and oriented to person, place, and time Mental status - alert, oriented to person, place, and time, depressed mood, anxious and a bit emotional due to circumstances  PELVIC External genitalia - External hemorrhoids Vagina - Day 7 of menstrual with moderate bleeding Cervix - Normal appearing,  Uterus - Strong muscle support  Assessment & Plan:   A:  1.  Menorrhagia 2. Uterine  fibroids 3. Desire for contraption 4. Hx of colon resection  P:  1.  F/u for sono-histogram following 4-7 days 2. Probable endometrial ablation here or at [facility] .  By signing my name below, I, Samul Dada, attest that this documentation has been prepared under the direction and in the presence of Jonnie Kind, MD. Electronically Signed: Stanley. 07/22/18. 9:30 AM.  I personally performed the services described in this documentation, which was SCRIBED in my presence. The recorded information has been reviewed and considered accurate. It has been edited as necessary during review. Jonnie Kind, MD

## 2018-07-23 ENCOUNTER — Other Ambulatory Visit: Payer: Self-pay | Admitting: *Deleted

## 2018-07-23 DIAGNOSIS — Z319 Encounter for procreative management, unspecified: Secondary | ICD-10-CM

## 2018-07-23 DIAGNOSIS — N921 Excessive and frequent menstruation with irregular cycle: Secondary | ICD-10-CM

## 2018-07-23 NOTE — Progress Notes (Signed)
[facility] scheduled at 3:15. Pt to arrive at 3pm on 8/2.

## 2018-07-26 ENCOUNTER — Ambulatory Visit (HOSPITAL_COMMUNITY): Admission: RE | Admit: 2018-07-26 | Payer: Managed Care, Other (non HMO) | Source: Ambulatory Visit

## 2018-07-30 ENCOUNTER — Ambulatory Visit (HOSPITAL_COMMUNITY)
Admission: RE | Admit: 2018-07-30 | Discharge: 2018-07-30 | Disposition: A | Discharge status: Home / Self Care | Payer: Managed Care, Other (non HMO) | Source: Ambulatory Visit | Attending: Obstetrics and Gynecology | Admitting: Obstetrics and Gynecology

## 2018-07-30 DIAGNOSIS — Z319 Encounter for procreative management, unspecified: Secondary | ICD-10-CM | POA: Diagnosis not present

## 2018-07-30 DIAGNOSIS — N921 Excessive and frequent menstruation with irregular cycle: Secondary | ICD-10-CM

## 2018-07-30 NOTE — Progress Notes (Signed)
POC Urine Preg Test is Negative

## 2018-07-31 LAB — POCT PREGNANCY, URINE: PREG TEST UR: NEGATIVE

## 2018-08-14 ENCOUNTER — Encounter: Payer: Self-pay | Admitting: Obstetrics and Gynecology

## 2018-08-14 ENCOUNTER — Other Ambulatory Visit: Payer: Self-pay | Admitting: Obstetrics and Gynecology

## 2018-08-14 ENCOUNTER — Ambulatory Visit: Payer: Managed Care, Other (non HMO) | Admitting: Obstetrics and Gynecology

## 2018-08-14 ENCOUNTER — Other Ambulatory Visit: Payer: Self-pay

## 2018-08-14 VITALS — BP 130/79 | HR 75 | Ht 71.0 in | Wt 270.0 lb

## 2018-08-14 DIAGNOSIS — N920 Excessive and frequent menstruation with regular cycle: Secondary | ICD-10-CM | POA: Diagnosis not present

## 2018-08-14 DIAGNOSIS — Z113 Encounter for screening for infections with a predominantly sexual mode of transmission: Secondary | ICD-10-CM

## 2018-08-14 DIAGNOSIS — Z01818 Encounter for other preprocedural examination: Secondary | ICD-10-CM | POA: Diagnosis not present

## 2018-08-14 MED ORDER — MEDROXYPROGESTERONE ACETATE 5 MG PO TABS: 5.0000 mg | ORAL_TABLET | Freq: Every day | ORAL | 0 refills | Status: DC

## 2018-08-14 NOTE — Progress Notes (Signed)
Patient ID: Shelly Larson, female   DOB: 27-Jun-1971, 47 y.o.   MRN: [id number]  Preoperative History and Physical  Shelly Larson is a 47 y.o. G0P0000 here for surgical management of Menorrhagia.   No significant preoperative concerns. Her Last period began yesterday 08/13/18 Proposed surgery: Endometrial ablation  Past Medical History:  Diagnosis Date  . Biliary acute pancreatitis 2010  . Colon cancer ([place]) 2016   Transverse colon   . Dyspnea   . Hypertension   . Migraine    Past Surgical History:  Procedure Laterality Date  . CHOLECYSTECTOMY  2010   [place], biliary pancreatitis  . COLECTOMY  09/30/2015   distal transverse d/t adenocarcinoma  . COLONOSCOPY N/A 06/11/2018   Procedure: COLONOSCOPY;  Surgeon: Daneil Dolin, MD;  Location: [facility];  Service: Endoscopy;  Laterality: N/A;  1:45pm  . POLYPECTOMY  06/11/2018   Procedure: POLYPECTOMY;  Surgeon: Daneil Dolin, MD;  Location: [facility];  Service: Endoscopy;;  colon   OB History  Gravida Para Term Preterm AB Living  0 0 0 0 0 0  SAB TAB Ectopic Multiple Live Births  0 0 0 0 0  Patient denies any other pertinent gynecologic issues.   Current Outpatient Medications on File Prior to Visit  Medication Sig Dispense Refill  . albuterol (PROAIR HFA) 108 (90 Base) MCG/ACT inhaler Inhale 2 puffs into the lungs every 4 (four) hours as needed.    Marland Kitchen amLODipine (NORVASC) 5 MG tablet TAKE 1 TABLET BY MOUTH EVERY DAY 30 tablet 2  . amphetamine-dextroamphetamine (ADDERALL XR) 20 MG 24 hr capsule Take 20 mg by mouth daily.     . fluticasone furoate-vilanterol (BREO ELLIPTA) 100-25 MCG/INH AEPB Inhale 1 puff into the lungs daily.    Marland Kitchen losartan-hydrochlorothiazide (HYZAAR) 50-12.5 MG tablet Take 1 tablet by mouth daily.     . metFORMIN (GLUCOPHAGE) 500 MG tablet Take 1 tablet (500 mg total) by mouth 2 (two) times daily with a meal. 60 tablet 2  . metoprolol succinate (TOPROL-XL) 50 MG 24 hr tablet Take 50 mg by mouth daily.   0  . nitroGLYCERIN (NITROSTAT) 0.4 MG SL tablet Place 0.4 mg under the tongue every 5 (five) minutes as needed.     . topiramate (TOPAMAX) 50 MG tablet TAKE 1 TABLET BY MOUTH AT BEDTIME FOR MIGRAINE PREVENTION  0   No current facility-administered medications on file prior to visit.    No Known Allergies  Social History:   reports that she has never smoked. She has never used smokeless tobacco. She reports that she drinks alcohol. She reports that she does not use drugs.  Family History  Problem Relation Age of Onset  . Hypertension Mother   . Diabetes Mellitus II Mother   . Heart disease Sister        dx in late 59's  . Breast cancer Paternal Grandmother   . Breast cancer Paternal Aunt   . Colon cancer Neg Hx     Review of Systems: Noncontributory  PHYSICAL EXAM: Last menstrual period 07/16/2018. General appearance - alert, well appearing, and in no distress Chest - clear to auscultation, no wheezes, rales or rhonchi, symmetric air entry Heart - normal rate and regular rhythm Abdomen - soft, nontender, nondistended, no masses or organomegaly, railroad track scar due to colon resection Pelvic:  VAGINA: , good support, elongate CERVIX: normal appearing cervix without discharge or lesions. UTERUS: Anteflexed, upper limits to normal size  Extremities - peripheral pulses normal, no  pedal edema, no clubbing or cyanosis  Labs: No results found for this or any previous visit (from the past 336 hour(s)).  Imaging Studies: [place] Sonohysterogram  Result Date: 07/31/2018 CLINICAL DATA:  Menometrorrhagia.  Uterine fibroids. EXAM: [place] SONOHYSTEROGRAM TECHNIQUE: Sonohysterogram was performed by the ordering physician. Ultrasound images were submitted for interpretation following the procedure. COMPARISON:  06/05/2018 FINDINGS: 2D images and cine clips obtained during sonohysterogram show smooth is endometrial lining, with no evidence of polyps or focal areas of thickening. Double layer  endometrial thickness measures 8 mm. Although a few small fibroids were better seen on recent pelvic ultrasound, no submucosal or intracavitary fibroids are visualized on this exam. IMPRESSION: No focal endometrial pathology identified. No submucosal or intracavitary fibroids. Electronically Signed   By: Earle Gell M.D.   On: 07/31/2018 09:01    Assessment: Patient Active Problem List   Diagnosis Date Noted  . Prediabetes 07/09/2018  . Class 2 severe obesity due to excess calories with serious comorbidity and body mass index (BMI) of 37.0 to 37.9 in adult ([place]) 07/09/2018  . Essential hypertension, benign 07/09/2018  . Mixed hyperlipidemia 07/09/2018  . H/O colon cancer, stage II 05/07/2018  . Change in bowel function 10/16/2017  . Hemorrhoid prolapse 10/16/2017  . Rectal bleeding 10/16/2017  . Colon cancer ([place]) 12/25/2014    Plan: 1. Rx: provera 5 mg once daily for 3 weeks 2. Refill Rx Tranexamic 3. F/u 3 weeks around (09/03/18) for Endometrial Ablation 4. Patient will undergo surgical management with menorrhagia.    ENDOMETRIAL BIOPSY Patient given informed consent, signed copy in the chart, time out was performed. Appropriate time out taken. . The patient was placed in the lithotomy position and the cervix brought into view with sterile speculum.  Portio of cervix cleansed x 2 with betadine swabs.  A tenaculum was placed in the anterior lip of the cervix.  The uterus was sounded for depth of 7 cm. A pipelle was introduced to into the uterus, suction created,  and an endometrial sample was obtained. All equipment was removed and accounted for.  The patient tolerated the procedure well.    By signing my name below, I, Samul Dada, attest that this documentation has been prepared under the direction and in the presence of Jonnie Kind, MD. Electronically Signed: [place]. 08/14/18. 1:36 PM.  I personally performed the services described in this documentation,  which was SCRIBED in my presence. The recorded information has been reviewed and considered accurate. It has been edited as necessary during review. Jonnie Kind, MD

## 2018-08-14 NOTE — Addendum Note (Signed)
Addended by: Gaylyn Rong A on: 08/14/2018 02:34 PM   Modules accepted: Orders

## 2018-08-17 LAB — GC/CHLAMYDIA PROBE AMP
CHLAMYDIA, DNA PROBE: NEGATIVE
NEISSERIA GONORRHOEAE BY PCR: NEGATIVE

## 2018-08-27 ENCOUNTER — Ambulatory Visit: Payer: Managed Care, Other (non HMO) | Admitting: Nutrition

## 2018-08-31 ENCOUNTER — Other Ambulatory Visit: Payer: Self-pay | Admitting: Obstetrics and Gynecology

## 2018-09-03 ENCOUNTER — Telehealth: Payer: Self-pay | Admitting: Internal Medicine

## 2018-09-03 ENCOUNTER — Ambulatory Visit: Payer: Managed Care, Other (non HMO) | Admitting: Internal Medicine

## 2018-09-03 ENCOUNTER — Encounter: Payer: Self-pay | Admitting: Internal Medicine

## 2018-09-03 NOTE — Patient Instructions (Signed)
Shelly Larson  09/03/2018     @PREFPERIOPPHARMACY @   Your procedure is scheduled on  09/10/2018 .  Report to [name] at  615  A.M.  Call this number if you have problems the morning of surgery:  [phone]   Remember:  Do not eat or drink after midnight.  You may drink clear liquids until  12 midnight 09/09/2018.  Clear liquids allowed are:                    Water, Juice (non-citric and without pulp), Carbonated beverages, Clear Tea, Black Coffee only, Plain Jell-O only, Gatorade and Plain Popsicles only    Take these medicines the morning of surgery with A SIP OF WATER  Amlodipine, adderall, losartan, metoprolol.    Do not wear jewelry, make-up or nail polish.  Do not wear lotions, powders, or perfumes, or deodorant.  Do not shave 48 hours prior to surgery.  Men may shave face and neck.  Do not bring valuables to the hospital.  [facility] is not responsible for any belongings or valuables.  Contacts, dentures or bridgework may not be worn into surgery.  Leave your suitcase in the car.  After surgery it may be brought to your room.  For patients admitted to the hospital, discharge time will be determined by your treatment team.  Patients discharged the day of surgery will not be allowed to drive home.   Name and phone number of your driver:   family Special instructions:  None  Please read over the following fact sheets that you were given. Anesthesia Post-op Instructions and Care and Recovery After Surgery       Dilation and Curettage or Vacuum Curettage Dilation and curettage (D&C) and vacuum curettage are minor procedures. A D&C involves stretching (dilation) the cervix and scraping (curettage) the inside lining of the uterus (endometrium). During a D&C, tissue is gently scraped from the endometrium, starting from the top portion of the uterus down to the lowest part of the uterus (cervix). During a vacuum curettage, the lining and tissue  in the uterus are removed with the use of gentle suction. Curettage may be performed to either diagnose or treat a problem. As a diagnostic procedure, curettage is performed to examine tissues from the uterus. A diagnostic curettage may be done if you have:  Irregular bleeding in the uterus.  Bleeding with the development of clots.  Spotting between menstrual periods.  Prolonged menstrual periods or other abnormal bleeding.  Bleeding after menopause.  No menstrual period (amenorrhea).  A change in size and shape of the uterus.  Abnormal endometrial cells discovered during a Pap test.  As a treatment procedure, curettage may be performed for the following reasons:  Removal of an IUD (intrauterine device).  Removal of retained placenta after giving birth.  Abortion.  Miscarriage.  Removal of endometrial polyps.  Removal of uncommon types of noncancerous lumps (fibroids).  Tell a health care provider about:  Any allergies you have, including allergies to prescribed medicine or latex.  All medicines you are taking, including vitamins, herbs, eye drops, creams, and over-the-counter medicines. This is especially important if you take any blood-thinning medicine. Bring a list of all of your medicines to your appointment.  Any problems you or family members have had with anesthetic medicines.  Any blood disorders you have.  Any surgeries you have had.  Your medical history  and any medical conditions you have.  Whether you are pregnant or may be pregnant.  Recent vaginal infections you have had.  Recent menstrual periods, bleeding problems you have had, and what form of birth control (contraception) you use. What are the risks? Generally, this is a safe procedure. However, problems may occur, including:  Infection.  Heavy vaginal bleeding.  Allergic reactions to medicines.  Damage to the cervix or other structures or organs.  Development of scar tissue  (adhesions) inside the uterus, which can cause abnormal amounts of menstrual bleeding. This may make it harder to get pregnant in the future.  A hole (perforation) or puncture in the uterine wall. This is rare.  What happens before the procedure? Staying hydrated Follow instructions from your health care provider about hydration, which may include:  Up to 2 hours before the procedure - you may continue to drink clear liquids, such as water, clear fruit juice, black coffee, and plain tea.  Eating and drinking restrictions Follow instructions from your health care provider about eating and drinking, which may include:  8 hours before the procedure - stop eating heavy meals or foods such as meat, fried foods, or fatty foods.  6 hours before the procedure - stop eating light meals or foods, such as toast or cereal.  6 hours before the procedure - stop drinking milk or drinks that contain milk.  2 hours before the procedure - stop drinking clear liquids. If your health care provider told you to take your medicine(s) on the day of your procedure, take them with only a sip of water.  Medicines  Ask your health care provider about: ? Changing or stopping your regular medicines. This is especially important if you are taking diabetes medicines or blood thinners. ? Taking medicines such as aspirin and ibuprofen. These medicines can thin your blood. Do not take these medicines before your procedure if your health care provider instructs you not to.  You may be given antibiotic medicine to help prevent infection. General instructions  For 24 hours before your procedure, do not: ? Douche. ? Use tampons. ? Use medicines, creams, or suppositories in the vagina. ? Have sexual intercourse.  You may be given a pregnancy test on the day of the procedure.  Plan to have someone take you home from the hospital or clinic.  You may have a blood or urine sample taken.  If you will be going home  right after the procedure, plan to have someone with you for 24 hours. What happens during the procedure?  To reduce your risk of infection: ? Your health care team will wash or sanitize their hands. ? Your skin will be washed with soap.  An IV tube will be inserted into one of your veins.  You will be given one of the following: ? A medicine that numbs the area in and around the cervix (local anesthetic). ? A medicine to make you fall asleep (general anesthetic).  You will lie down on your back, with your feet in foot rests (stirrups).  The size and position of your uterus will be checked.  A lubricated instrument (speculum or Sims retractor) will be inserted into the back side of your vagina. The speculum will be used to hold apart the walls of your vagina so your health care provider can see your cervix.  A tool (tenaculum) will be attached to the lip of the cervix to stabilize it.  Your cervix will be softened and dilated. This may  be done by: ? Taking a medicine. ? Having tapered dilators or thin rods (laminaria) or gradual widening instruments (tapered dilators) inserted into your cervix.  A small, sharp, curved instrument (curette) will be used to scrape a small amount of tissue or cells from the endometrium or cervical canal. In some cases, gentle suction is applied with the curette. The curette will then be removed. The cells will be taken to a lab for testing. The procedure may vary among health care providers and hospitals. What happens after the procedure?  You may have mild cramping, backache, pain, and light bleeding or spotting. You may pass small blood clots from your vagina.  You may have to wear compression stockings. These stockings help to prevent blood clots and reduce swelling in your legs.  Your blood pressure, heart rate, breathing rate, and blood oxygen level will be monitored until the medicines you were given have worn off. Summary  Dilation and  curettage (D&C) involves stretching (dilation) the cervix and scraping (curettage) the inside lining of the uterus (endometrium).  After the procedure, you may have mild cramping, backache, pain, and light bleeding or spotting. You may pass small blood clots from your vagina.  Plan to have someone take you home from the hospital or clinic. This information is not intended to replace advice given to you by your health care provider. Make sure you discuss any questions you have with your health care provider. Document Released: 12/11/2005 Document Revised: 08/27/2016 Document Reviewed: 08/27/2016 Elsevier Interactive Patient Education  2018 [employer].  Dilation and Curettage or Vacuum Curettage, Care After These instructions give you information about caring for yourself after your procedure. Your doctor may also give you more specific instructions. Call your doctor if you have any problems or questions after your procedure. Follow these instructions at home: Activity  Do not drive or use heavy machinery while taking prescription pain medicine.  For 24 hours after your procedure, avoid driving.  Take short walks often, followed by rest periods. Ask your doctor what activities are safe for you. After one or two days, you may be able to return to your normal activities.  Do not lift anything that is heavier than 10 lb (4.5 kg) until your doctor approves.  For at least 2 weeks, or as long as told by your doctor: ? Do not douche. ? Do not use tampons. ? Do not have sex. General instructions  Take over-the-counter and prescription medicines only as told by your doctor. This is very important if you take blood thinning medicine.  Do not take baths, swim, or use a hot tub until your doctor approves. Take showers instead of baths.  Wear compression stockings as told by your doctor.  It is up to you to get the results of your procedure. Ask your doctor when your results will be  ready.  Keep all follow-up visits as told by your doctor. This is important. Contact a doctor if:  You have very bad cramps that get worse or do not get better with medicine.  You have very bad pain in your belly (abdomen).  You cannot drink fluids without throwing up (vomiting).  You get pain in a different part of the area between your belly and thighs (pelvis).  You have bad-smelling discharge from your vagina.  You have a rash. Get help right away if:  You are bleeding a lot from your vagina. A lot of bleeding means soaking more than one sanitary pad in an hour, for  2 hours in a row.  You have clumps of blood (blood clots) coming from your vagina.  You have a fever or chills.  Your belly feels very tender or hard.  You have chest pain.  You have trouble breathing.  You cough up blood.  You feel dizzy.  You feel light-headed.  You pass out (faint).  You have pain in your neck or shoulder area. Summary  Take short walks often, followed by rest periods. Ask your doctor what activities are safe for you. After one or two days, you may be able to return to your normal activities.  Do not lift anything that is heavier than 10 lb (4.5 kg) until your doctor approves.  Do not take baths, swim, or use a hot tub until your doctor approves. Take showers instead of baths.  Contact your doctor if you have any symptoms of infection, like bad-smelling discharge from your vagina. This information is not intended to replace advice given to you by your health care provider. Make sure you discuss any questions you have with your health care provider. Document Released: 09/19/2008 Document Revised: 08/28/2016 Document Reviewed: 08/28/2016 Elsevier Interactive Patient Education  2017 [place]. Hysteroscopy Hysteroscopy is a procedure used for looking inside the womb (uterus). It may be done for various reasons, including:  To evaluate abnormal bleeding, fibroid (benign,  noncancerous) tumors, polyps, scar tissue (adhesions), and possibly cancer of the uterus.  To look for lumps (tumors) and other uterine growths.  To look for causes of why a woman cannot get pregnant (infertility), causes of recurrent loss of pregnancy (miscarriages), or a lost intrauterine device (IUD).  To perform a sterilization by blocking the fallopian tubes from inside the uterus.  In this procedure, a thin, flexible tube with a tiny light and camera on the end of it (hysteroscope) is used to look inside the uterus. A hysteroscopy should be done right after a menstrual period to be sure you are not pregnant. LET [facility] CARE PROVIDER KNOW ABOUT:  Any allergies you have.  All medicines you are taking, including vitamins, herbs, eye drops, creams, and over-the-counter medicines.  Previous problems you or members of your family have had with the use of anesthetics.  Any blood disorders you have.  Previous surgeries you have had.  Medical conditions you have. RISKS AND COMPLICATIONS Generally, this is a safe procedure. However, as with any procedure, complications can occur. Possible complications include:  Putting a hole in the uterus.  Excessive bleeding.  Infection.  Damage to the cervix.  Injury to other organs.  Allergic reaction to medicines.  Too much fluid used in the uterus for the procedure.  BEFORE THE PROCEDURE  Ask your health care provider about changing or stopping any regular medicines.  Do not take aspirin or blood thinners for 1 week before the procedure, or as directed by your health care provider. These can cause bleeding.  If you smoke, do not smoke for 2 weeks before the procedure.  In some cases, a medicine is placed in the cervix the day before the procedure. This medicine makes the cervix have a larger opening (dilate). This makes it easier for the instrument to be inserted into the uterus during the procedure.  Do not eat or drink  anything for at least 8 hours before the surgery.  Arrange for someone to take you home after the procedure. PROCEDURE  You may be given a medicine to relax you (sedative). You may also be given one of  the following: ? A medicine that numbs the area around the cervix (local anesthetic). ? A medicine that makes you sleep through the procedure (general anesthetic).  The hysteroscope is inserted through the vagina into the uterus. The camera on the hysteroscope sends a picture to a TV screen. This gives the surgeon a good view inside the uterus.  During the procedure, air or a liquid is put into the uterus, which allows the surgeon to see better.  Sometimes, tissue is gently scraped from inside the uterus. These tissue samples are sent to a lab for testing. What to expect after the procedure  If you had a general anesthetic, you may be groggy for a couple hours after the procedure.  If you had a local anesthetic, you will be able to go home as soon as you are stable and feel ready.  You may have some cramping. This normally lasts for a couple days.  You may have bleeding, which varies from light spotting for a few days to menstrual-like bleeding for 3-7 days. This is normal.  If your test results are not back during the visit, make an appointment with your health care provider to find out the results. This information is not intended to replace advice given to you by your health care provider. Make sure you discuss any questions you have with your health care provider. Document Released: 03/19/2001 Document Revised: 05/18/2016 Document Reviewed: 07/10/2013 Elsevier Interactive Patient Education  2017 [place]. Hysteroscopy, Care After Refer to this sheet in the next few weeks. These instructions provide you with information on caring for yourself after your procedure. Your health care provider may also give you more specific instructions. Your treatment has been planned according to  current medical practices, but problems sometimes occur. Call your health care provider if you have any problems or questions after your procedure. What can I expect after the procedure? After your procedure, it is typical to have the following:  You may have some cramping. This normally lasts for a couple days.  You may have bleeding. This can vary from light spotting for a few days to menstrual-like bleeding for 3-7 days.  Follow these instructions at home:  Rest for the first 1-2 days after the procedure.  Only take over-the-counter or prescription medicines as directed by your health care provider. Do not take aspirin. It can increase the chances of bleeding.  Take showers instead of baths for 2 weeks or as directed by your health care provider.  Do not drive for 24 hours or as directed.  Do not drink alcohol while taking pain medicine.  Do not use tampons, douche, or have sexual intercourse for 2 weeks or until your health care provider says it is okay.  Take your temperature twice a day for 4-5 days. Write it down each time.  Follow your health care provider's advice about diet, exercise, and lifting.  If you develop constipation, you may: ? Take a mild laxative if your health care provider approves. ? Add bran foods to your diet. ? Drink enough fluids to keep your urine clear or pale yellow.  Try to have someone with you or available to you for the first 24-48 hours, especially if you were given a general anesthetic.  Follow up with your health care provider as directed. Contact a health care provider if:  You feel dizzy or lightheaded.  You feel sick to your stomach (nauseous).  You have abnormal vaginal discharge.  You have a rash.  You  have pain that is not controlled with medicine. Get help right away if:  You have bleeding that is heavier than a normal menstrual period.  You have a fever.  You have increasing cramps or pain, not controlled with  medicine.  You have new belly (abdominal) pain.  You pass out.  You have pain in the tops of your shoulders (shoulder strap areas).  You have shortness of breath. This information is not intended to replace advice given to you by your health care provider. Make sure you discuss any questions you have with your health care provider. Document Released: 10/01/2013 Document Revised: 05/18/2016 Document Reviewed: 07/10/2013 Elsevier Interactive Patient Education  2017 [place].  Endometrial Ablation Endometrial ablation is a procedure that destroys the thin inner layer of the lining of the uterus (endometrium). This procedure may be done:  To stop heavy periods.  To stop bleeding that is causing anemia.  To control irregular bleeding.  To treat bleeding caused by small tumors (fibroids) in the endometrium.  This procedure is often an alternative to major surgery, such as removal of the uterus and cervix (hysterectomy). As a result of this procedure:  You may not be able to have children. However, if you are premenopausal (you have not gone through menopause): ? You may still have a small chance of getting pregnant. ? You will need to use a reliable method of birth control after the procedure to prevent pregnancy.  You may stop having a menstrual period, or you may have only a small amount of bleeding during your period. Menstruation may return several years after the procedure.  Tell a health care provider about:  Any allergies you have.  All medicines you are taking, including vitamins, herbs, eye drops, creams, and over-the-counter medicines.  Any problems you or family members have had with the use of anesthetic medicines.  Any blood disorders you have.  Any surgeries you have had.  Any medical conditions you have. What are the risks? Generally, this is a safe procedure. However, problems may occur, including:  A hole (perforation) in the uterus or  bowel.  Infection of the uterus, bladder, or vagina.  Bleeding.  Damage to other structures or organs.  An air bubble in the lung (air embolus).  Problems with pregnancy after the procedure.  Failure of the procedure.  Decreased ability to diagnose cancer in the endometrium.  What happens before the procedure?  You will have tests of your endometrium to make sure there are no pre-cancerous cells or cancer cells present.  You may have an ultrasound of the uterus.  You may be given medicines to thin the endometrium.  Ask your health care provider about: ? Changing or stopping your regular medicines. This is especially important if you take diabetes medicines or blood thinners. ? Taking medicines such as aspirin and ibuprofen. These medicines can thin your blood. Do not take these medicines before your procedure if your doctor tells you not to.  Plan to have someone take you home from the hospital or clinic. What happens during the procedure?  You will lie on an exam table with your feet and legs supported as in a pelvic exam.  To lower your risk of infection: ? Your health care team will wash or sanitize their hands and put on germ-free (sterile) gloves. ? Your genital area will be washed with soap.  An IV tube will be inserted into one of your veins.  You will be given a medicine to help  you relax (sedative).  A surgical instrument with a light and camera (resectoscope) will be inserted into your vagina and moved into your uterus. This allows your surgeon to see inside your uterus.  Endometrial tissue will be removed using one of the following methods: ? Radiofrequency. This method uses a radiofrequency-alternating electric current to remove the endometrium. ? Cryotherapy. This method uses extreme cold to freeze the endometrium. ? Heated-free liquid. This method uses a heated saltwater (saline) solution to remove the endometrium. ? Microwave. This method uses high-energy  microwaves to heat up the endometrium and remove it. ? Thermal balloon. This method involves inserting a catheter with a balloon tip into the uterus. The balloon tip is filled with heated fluid to remove the endometrium. The procedure may vary among health care providers and hospitals. What happens after the procedure?  Your blood pressure, heart rate, breathing rate, and blood oxygen level will be monitored until the medicines you were given have worn off.  As tissue healing occurs, you may notice vaginal bleeding for 4-6 weeks after the procedure. You may also experience: ? Cramps. ? Thin, watery vaginal discharge that is light pink or brown in color. ? A need to urinate more frequently than usual. ? Nausea.  Do not drive for 24 hours if you were given a sedative.  Do not have sex or insert anything into your vagina until your health care provider approves. Summary  Endometrial ablation is done to treat the many causes of heavy menstrual bleeding.  The procedure may be done only after medications have been tried to control the bleeding.  Plan to have someone take you home from the hospital or clinic. This information is not intended to replace advice given to you by your health care provider. Make sure you discuss any questions you have with your health care provider. Document Released: 10/20/2004 Document Revised: 12/28/2016 Document Reviewed: 12/28/2016 Elsevier Interactive Patient Education  2017 [place] Anesthesia, Adult General anesthesia is the use of medicines to make a person "go to sleep" (be unconscious) for a medical procedure. General anesthesia is often recommended when a procedure:  Is long.  Requires you to be still or in an unusual position.  Is major and can cause you to lose blood.  Is impossible to do without general anesthesia.  The medicines used for general anesthesia are called general anesthetics. In addition to making you sleep, the  medicines:  Prevent pain.  Control your blood pressure.  Relax your muscles.  Tell a health care provider about:  Any allergies you have.  All medicines you are taking, including vitamins, herbs, eye drops, creams, and over-the-counter medicines.  Any problems you or family members have had with anesthetic medicines.  Types of anesthetics you have had in the past.  Any bleeding disorders you have.  Any surgeries you have had.  Any medical conditions you have.  Any history of heart or lung conditions, such as heart failure, sleep apnea, or chronic obstructive pulmonary disease (COPD).  Whether you are pregnant or may be pregnant.  Whether you use tobacco, alcohol, marijuana, or street drugs.  Any history of Armed forces logistics/support/administrative officer.  Any history of depression or anxiety. What are the risks? Generally, this is a safe procedure. However, problems may occur, including:  Allergic reaction to anesthetics.  Lung and heart problems.  Inhaling food or liquids from your stomach into your lungs (aspiration).  Injury to nerves.  Waking up during your procedure and being unable to move (  rare).  Extreme agitation or a state of mental confusion (delirium) when you wake up from the anesthetic.  Air in the bloodstream, which can lead to stroke.  These problems are more likely to develop if you are having a major surgery or if you have an advanced medical condition. You can prevent some of these complications by answering all of your health care provider's questions thoroughly and by following all pre-procedure instructions. General anesthesia can cause side effects, including:  Nausea or vomiting  A sore throat from the breathing tube.  Feeling cold or shivery.  Feeling tired, washed out, or achy.  Sleepiness or drowsiness.  Confusion or agitation.  What happens before the procedure? Staying hydrated Follow instructions from your health care provider about hydration, which  may include:  Up to 2 hours before the procedure - you may continue to drink clear liquids, such as water, clear fruit juice, black coffee, and plain tea.  Eating and drinking restrictions Follow instructions from your health care provider about eating and drinking, which may include:  8 hours before the procedure - stop eating heavy meals or foods such as meat, fried foods, or fatty foods.  6 hours before the procedure - stop eating light meals or foods, such as toast or cereal.  6 hours before the procedure - stop drinking milk or drinks that contain milk.  2 hours before the procedure - stop drinking clear liquids.  Medicines  Ask your health care provider about: ? Changing or stopping your regular medicines. This is especially important if you are taking diabetes medicines or blood thinners. ? Taking medicines such as aspirin and ibuprofen. These medicines can thin your blood. Do not take these medicines before your procedure if your health care provider instructs you not to. ? Taking new dietary supplements or medicines. Do not take these during the week before your procedure unless your health care provider approves them.  If you are told to take a medicine or to continue taking a medicine on the day of the procedure, take the medicine with sips of water. General instructions   Ask if you will be going home the same day, the following day, or after a longer hospital stay. ? Plan to have someone take you home. ? Plan to have someone stay with you for the first 24 hours after you leave the hospital or clinic.  For 3-6 weeks before the procedure, try not to use any tobacco products, such as cigarettes, chewing tobacco, and e-cigarettes.  You may brush your teeth on the morning of the procedure, but make sure to spit out the toothpaste. What happens during the procedure?  You will be given anesthetics through a mask and through an IV tube in one of your veins.  You may receive  medicine to help you relax (sedative).  As soon as you are asleep, a breathing tube may be used to help you breathe.  An anesthesia specialist will stay with you throughout the procedure. He or she will help keep you comfortable and safe by continuing to give you medicines and adjusting the amount of medicine that you get. He or she will also watch your blood pressure, pulse, and oxygen levels to make sure that the anesthetics do not cause any problems.  If a breathing tube was used to help you breathe, it will be removed before you wake up. The procedure may vary among health care providers and hospitals. What happens after the procedure?  You will wake up,  often slowly, after the procedure is complete, usually in a recovery area.  Your blood pressure, heart rate, breathing rate, and blood oxygen level will be monitored until the medicines you were given have worn off.  You may be given medicine to help you calm down if you feel anxious or agitated.  If you will be going home the same day, your health care provider may check to make sure you can stand, drink, and urinate.  Your health care providers will treat your pain and side effects before you go home.  Do not drive for 24 hours if you received a sedative.  You may: ? Feel nauseous and vomit. ? Have a sore throat. ? Have mental slowness. ? Feel cold or shivery. ? Feel sleepy. ? Feel tired. ? Feel sore or achy, even in parts of your body where you did not have surgery. This information is not intended to replace advice given to you by your health care provider. Make sure you discuss any questions you have with your health care provider. Document Released: 03/19/2008 Document Revised: 05/23/2016 Document Reviewed: 11/25/2015 Elsevier Interactive Patient Education  2018 [place] [facility]hesia, Adult, Care After These instructions provide you with information about caring for yourself after your procedure. Your health  care provider may also give you more specific instructions. Your treatment has been planned according to current medical practices, but problems sometimes occur. Call your health care provider if you have any problems or questions after your procedure. What can I expect after the procedure? After the procedure, it is common to have:  Vomiting.  A sore throat.  Mental slowness.  It is common to feel:  Nauseous.  Cold or shivery.  Sleepy.  Tired.  Sore or achy, even in parts of your body where you did not have surgery.  Follow these instructions at home: For at least 24 hours after the procedure:  Do not: ? Participate in activities where you could fall or become injured. ? Drive. ? Use heavy machinery. ? Drink alcohol. ? Take sleeping pills or medicines that cause drowsiness. ? Make important decisions or sign legal documents. ? Take care of children on your own.  Rest. Eating and drinking  If you vomit, drink water, juice, or soup when you can drink without vomiting.  Drink enough fluid to keep your urine clear or pale yellow.  Make sure you have little or no nausea before eating solid foods.  Follow the diet recommended by your health care provider. General instructions  Have a responsible adult stay with you until you are awake and alert.  Return to your normal activities as told by your health care provider. Ask your health care provider what activities are safe for you.  Take over-the-counter and prescription medicines only as told by your health care provider.  If you smoke, do not smoke without supervision.  Keep all follow-up visits as told by your health care provider. This is important. Contact a health care provider if:  You continue to have nausea or vomiting at home, and medicines are not helpful.  You cannot drink fluids or start eating again.  You cannot urinate after 8-12 hours.  You develop a skin rash.  You have fever.  You have  increasing redness at the site of your procedure. Get help right away if:  You have difficulty breathing.  You have chest pain.  You have unexpected bleeding.  You feel that you are having a life-threatening or urgent problem. This information  is not intended to replace advice given to you by your health care provider. Make sure you discuss any questions you have with your health care provider. Document Released: 03/19/2001 Document Revised: 05/15/2016 Document Reviewed: 11/25/2015 Elsevier Interactive Patient Education  [employer].

## 2018-09-03 NOTE — Telephone Encounter (Signed)
PATIENT WAS A NO SHOW AND LETTER SENT  °

## 2018-09-03 NOTE — Telephone Encounter (Signed)
Noted  

## 2018-09-03 NOTE — Telephone Encounter (Signed)
PATIENT CAME IN 30 MINUTES LATE FOR HER APPOINTMENT.  SHE WAS AWARE SHE WAS LATE AND I TOLD HER THE NEXT APPOINTMENT WITH [facility] AND SHE SAID SHE COULDN'T WAIT THAT LONG AND SAID SHE WAS GOING TO TRY AND FIND SOMEONE TO "TAKE THE HEMORRHOID OUT " BECAUSE IT WAS BOTHERING HER AND SHE COULD UL NOT WAIT.

## 2018-09-04 ENCOUNTER — Encounter (HOSPITAL_COMMUNITY): Payer: Self-pay

## 2018-09-04 ENCOUNTER — Encounter (HOSPITAL_COMMUNITY)
Admission: RE | Admit: 2018-09-04 | Discharge: 2018-09-04 | Disposition: A | Discharge status: Home / Self Care | Payer: Managed Care, Other (non HMO) | Source: Ambulatory Visit | Attending: Obstetrics and Gynecology | Admitting: Obstetrics and Gynecology

## 2018-09-04 ENCOUNTER — Other Ambulatory Visit: Payer: Self-pay | Admitting: Obstetrics and Gynecology

## 2018-09-04 DIAGNOSIS — Z01818 Encounter for other preprocedural examination: Secondary | ICD-10-CM | POA: Diagnosis not present

## 2018-09-04 HISTORY — DX: Unspecified osteoarthritis, unspecified site: M19.90

## 2018-09-04 HISTORY — DX: Attention-deficit hyperactivity disorder, unspecified type: F90.9

## 2018-09-04 HISTORY — DX: Type 2 diabetes mellitus without complications: E11.9

## 2018-09-04 HISTORY — DX: Anemia, unspecified: D64.9

## 2018-09-04 LAB — GLUCOSE, CAPILLARY: GLUCOSE-CAPILLARY: 97 mg/dL (ref 70–99)

## 2018-09-04 LAB — HEMOGLOBIN A1C
Hgb A1c MFr Bld: 6.2 % — ABNORMAL HIGH (ref 4.8–5.6)
MEAN PLASMA GLUCOSE: 131.24 mg/dL

## 2018-09-04 LAB — COMPREHENSIVE METABOLIC PANEL
ALT: 24 U/L (ref 0–44)
AST: 15 U/L (ref 15–41)
Albumin: 3.8 g/dL (ref 3.5–5.0)
Alkaline Phosphatase: 56 U/L (ref 38–126)
Anion gap: 7 (ref 5–15)
BILIRUBIN TOTAL: 0.4 mg/dL (ref 0.3–1.2)
BUN: 11 mg/dL (ref 6–20)
CO2: 23 mmol/L (ref 22–32)
Calcium: 8.8 mg/dL — ABNORMAL LOW (ref 8.9–10.3)
Chloride: 106 mmol/L (ref 98–111)
Creatinine, Ser: 0.88 mg/dL (ref 0.44–1.00)
GFR calc non Af Amer: >60 mL/min (ref >60)
Glucose, Bld: 102 mg/dL — ABNORMAL HIGH (ref 70–99)
Potassium: 3.4 mmol/L — ABNORMAL LOW (ref 3.5–5.1)
SODIUM: 136 mmol/L (ref 135–145)
TOTAL PROTEIN: 7.1 g/dL (ref 6.5–8.1)

## 2018-09-04 LAB — CBC
HCT: 41.5 % (ref 36.0–46.0)
Hemoglobin: 13.4 g/dL (ref 12.0–15.0)
MCH: 27.1 pg (ref 26.0–34.0)
MCHC: 32.3 g/dL (ref 30.0–36.0)
MCV: 83.8 fL (ref 78.0–100.0)
Platelets: 162 10*3/uL (ref 150–400)
RBC: 4.95 MIL/uL (ref 3.87–5.11)
RDW: 15.2 % (ref 11.5–15.5)
WBC: 5.8 10*3/uL (ref 4.0–10.5)

## 2018-09-04 LAB — HCG, SERUM, QUALITATIVE: Preg, Serum: NEGATIVE

## 2018-09-04 NOTE — Progress Notes (Signed)
   09/04/18 1554  OBSTRUCTIVE SLEEP APNEA  Have you ever been diagnosed with sleep apnea through a sleep study? No  Do you snore loudly (loud enough to be heard through closed doors)?  1  Do you often feel tired, fatigued, or sleepy during the daytime (such as falling asleep during driving or talking to someone)? 0  Has anyone observed you stop breathing during your sleep? 1  Do you have, or are you being treated for high blood pressure? 1  BMI more than 35 kg/m2? 1  Age > 50 (1-yes) 0  Neck circumference greater than:Female 16 inches or larger, Female 17inches or larger? 0  Female Gender (Yes=1) 0  Obstructive Sleep Apnea Score 4  Score 5 or greater  Results sent to PCP

## 2018-09-04 NOTE — Telephone Encounter (Signed)
Patient stated she does not wish to see another GI doctor, she wants to continue to see Dr. Gala Romney.  She said she made the comment that she would have her pcp give her something for her hemorrhoids.

## 2018-09-05 NOTE — Pre-Procedure Instructions (Signed)
HgbA1C routed to PCP. 

## 2018-09-06 ENCOUNTER — Telehealth: Payer: Self-pay | Admitting: Obstetrics and Gynecology

## 2018-09-06 NOTE — Telephone Encounter (Signed)
Pt is having surgery on Tuesday and wanted Ferg to know she was on cycle today pretty heavy and wanted to make sure it would not effect that please call pt

## 2018-09-06 NOTE — Telephone Encounter (Signed)
LMOVM that bleeding was still fine for her to have surgery.

## 2018-09-09 ENCOUNTER — Ambulatory Visit (INDEPENDENT_AMBULATORY_CARE_PROVIDER_SITE_OTHER): Payer: Managed Care, Other (non HMO) | Admitting: Gastroenterology

## 2018-09-09 ENCOUNTER — Encounter: Payer: Self-pay | Admitting: Gastroenterology

## 2018-09-09 VITALS — BP 155/102 | HR 69 | Temp 98.6°F | Ht 71.0 in | Wt 269.8 lb

## 2018-09-09 DIAGNOSIS — K648 Other hemorrhoids: Secondary | ICD-10-CM

## 2018-09-09 NOTE — Progress Notes (Signed)
Patient was originally scheduled for hemorrhoid banding with Dr. Gala Romney but had to be rescheduled due to being late for appointment.   She was offered OV today for medical management of his but she states she is tried just about everything and really wants banding.  Currently hemorrhoids are less symptomatic as far as pain.  She wants to be rescheduled for hemorrhoid banding with Dr. Gala Romney.  Appointment today was canceled.

## 2018-09-10 ENCOUNTER — Encounter (HOSPITAL_COMMUNITY)
Admission: RE | Disposition: A | Discharge status: Home / Self Care | Payer: Self-pay | Source: Ambulatory Visit | Attending: Obstetrics and Gynecology

## 2018-09-10 ENCOUNTER — Ambulatory Visit (HOSPITAL_COMMUNITY)
Admission: RE | Admit: 2018-09-10 | Discharge: 2018-09-10 | Disposition: A | Discharge status: Home / Self Care | Payer: Managed Care, Other (non HMO) | Source: Ambulatory Visit | Attending: Obstetrics and Gynecology | Admitting: Obstetrics and Gynecology

## 2018-09-10 ENCOUNTER — Ambulatory Visit (HOSPITAL_COMMUNITY): Payer: Managed Care, Other (non HMO) | Admitting: Anesthesiology

## 2018-09-10 ENCOUNTER — Encounter (HOSPITAL_COMMUNITY): Payer: Self-pay

## 2018-09-10 DIAGNOSIS — N920 Excessive and frequent menstruation with regular cycle: Secondary | ICD-10-CM | POA: Diagnosis present

## 2018-09-10 DIAGNOSIS — Z9049 Acquired absence of other specified parts of digestive tract: Secondary | ICD-10-CM | POA: Diagnosis not present

## 2018-09-10 DIAGNOSIS — I10 Essential (primary) hypertension: Secondary | ICD-10-CM | POA: Insufficient documentation

## 2018-09-10 DIAGNOSIS — Z7951 Long term (current) use of inhaled steroids: Secondary | ICD-10-CM | POA: Insufficient documentation

## 2018-09-10 DIAGNOSIS — E782 Mixed hyperlipidemia: Secondary | ICD-10-CM | POA: Diagnosis not present

## 2018-09-10 DIAGNOSIS — Z7984 Long term (current) use of oral hypoglycemic drugs: Secondary | ICD-10-CM | POA: Insufficient documentation

## 2018-09-10 DIAGNOSIS — E119 Type 2 diabetes mellitus without complications: Secondary | ICD-10-CM | POA: Insufficient documentation

## 2018-09-10 DIAGNOSIS — Z79899 Other long term (current) drug therapy: Secondary | ICD-10-CM | POA: Insufficient documentation

## 2018-09-10 DIAGNOSIS — G43909 Migraine, unspecified, not intractable, without status migrainosus: Secondary | ICD-10-CM | POA: Insufficient documentation

## 2018-09-10 DIAGNOSIS — Z85038 Personal history of other malignant neoplasm of large intestine: Secondary | ICD-10-CM | POA: Diagnosis not present

## 2018-09-10 HISTORY — PX: DILITATION & CURRETTAGE/HYSTROSCOPY WITH NOVASURE ABLATION: SHX5568

## 2018-09-10 LAB — GLUCOSE, CAPILLARY
GLUCOSE-CAPILLARY: 114 mg/dL — AB (ref 70–99)
Glucose-Capillary: 108 mg/dL — ABNORMAL HIGH (ref 70–99)

## 2018-09-10 SURGERY — DILATATION & CURETTAGE/HYSTEROSCOPY WITH NOVASURE ABLATION
Anesthesia: General | Site: Vagina

## 2018-09-10 MED ORDER — HYDROCODONE-ACETAMINOPHEN 7.5-325 MG PO TABS: 1.0000 | ORAL_TABLET | Freq: Once | ORAL | Status: DC | PRN

## 2018-09-10 MED ORDER — ONDANSETRON HCL 4 MG/2ML IJ SOLN
INTRAMUSCULAR | Status: AC
  Filled 2018-09-10: qty 4

## 2018-09-10 MED ORDER — BUPIVACAINE-EPINEPHRINE (PF) 0.5% -1:200000 IJ SOLN
INTRAMUSCULAR | Status: AC
  Filled 2018-09-10: qty 30

## 2018-09-10 MED ORDER — SEVOFLURANE IN SOLN
RESPIRATORY_TRACT | Status: AC
  Filled 2018-09-10: qty 250

## 2018-09-10 MED ORDER — ONDANSETRON HCL 4 MG/2ML IJ SOLN: 4.0000 mg | Freq: Once | INTRAMUSCULAR | Status: DC | PRN

## 2018-09-10 MED ORDER — KETOROLAC TROMETHAMINE 30 MG/ML IJ SOLN
30.0000 mg | Freq: Once | INTRAMUSCULAR | Status: AC | PRN
  Administered 2018-09-10: 30 mg via INTRAVENOUS
  Filled 2018-09-10: qty 1

## 2018-09-10 MED ORDER — TRAMADOL HCL 50 MG PO TABS: 50.0000 mg | ORAL_TABLET | Freq: Four times a day (QID) | ORAL | 0 refills | Status: DC | PRN

## 2018-09-10 MED ORDER — LIDOCAINE HCL 1 % IJ SOLN
INTRAMUSCULAR | Status: DC | PRN
  Administered 2018-09-10: 50 mg via INTRADERMAL

## 2018-09-10 MED ORDER — MIDAZOLAM HCL 2 MG/2ML IJ SOLN
INTRAMUSCULAR | Status: AC
  Filled 2018-09-10: qty 2

## 2018-09-10 MED ORDER — 0.9 % SODIUM CHLORIDE (POUR BTL) OPTIME
TOPICAL | Status: DC | PRN
  Administered 2018-09-10: 1000 mL

## 2018-09-10 MED ORDER — SODIUM CHLORIDE 0.9 % IR SOLN
Status: DC | PRN
  Administered 2018-09-10: 3000 mL

## 2018-09-10 MED ORDER — PROPOFOL 10 MG/ML IV BOLUS
INTRAVENOUS | Status: AC
  Filled 2018-09-10: qty 20

## 2018-09-10 MED ORDER — FENTANYL CITRATE (PF) 100 MCG/2ML IJ SOLN
INTRAMUSCULAR | Status: AC
  Filled 2018-09-10: qty 2

## 2018-09-10 MED ORDER — KETOROLAC TROMETHAMINE 10 MG PO TABS: 10.0000 mg | ORAL_TABLET | Freq: Four times a day (QID) | ORAL | 0 refills | Status: DC

## 2018-09-10 MED ORDER — BUPIVACAINE-EPINEPHRINE (PF) 0.5% -1:200000 IJ SOLN
INTRAMUSCULAR | Status: DC | PRN
  Administered 2018-09-10: 20 mL via PERINEURAL

## 2018-09-10 MED ORDER — PROPOFOL 10 MG/ML IV BOLUS
INTRAVENOUS | Status: DC | PRN
  Administered 2018-09-10: 200 mg via INTRAVENOUS

## 2018-09-10 MED ORDER — DEXTROSE 5 % IV SOLN
3.0000 g | INTRAVENOUS | Status: AC
  Administered 2018-09-10: 3 g via INTRAVENOUS
  Filled 2018-09-10: qty 3000

## 2018-09-10 MED ORDER — LACTATED RINGERS IV SOLN
INTRAVENOUS | Status: DC
  Administered 2018-09-10: 07:00:00 via INTRAVENOUS

## 2018-09-10 MED ORDER — MIDAZOLAM HCL 5 MG/5ML IJ SOLN
INTRAMUSCULAR | Status: DC | PRN
  Administered 2018-09-10: 2 mg via INTRAVENOUS

## 2018-09-10 MED ORDER — MEPERIDINE HCL 50 MG/ML IJ SOLN: 6.2500 mg | INTRAMUSCULAR | Status: DC | PRN

## 2018-09-10 MED ORDER — ONDANSETRON HCL 4 MG/2ML IJ SOLN
INTRAMUSCULAR | Status: DC | PRN
  Administered 2018-09-10: 4 mg via INTRAVENOUS

## 2018-09-10 MED ORDER — SUCCINYLCHOLINE CHLORIDE 20 MG/ML IJ SOLN
INTRAMUSCULAR | Status: AC
  Filled 2018-09-10: qty 1

## 2018-09-10 MED ORDER — HYDROMORPHONE HCL 1 MG/ML IJ SOLN: 0.2500 mg | INTRAMUSCULAR | Status: DC | PRN

## 2018-09-10 MED ORDER — FENTANYL CITRATE (PF) 100 MCG/2ML IJ SOLN
INTRAMUSCULAR | Status: DC | PRN
  Administered 2018-09-10: 50 ug via INTRAVENOUS

## 2018-09-10 SURGICAL SUPPLY — 26 items
ABLATOR ENDOMETRIAL BIPOLAR (ABLATOR) IMPLANT
CLOTH BEACON ORANGE TIMEOUT ST (SAFETY) ×2 IMPLANT
COVER LIGHT HANDLE STERIS (MISCELLANEOUS) ×4 IMPLANT
DECANTER SPIKE VIAL GLASS SM (MISCELLANEOUS) ×2 IMPLANT
GAUZE SPONGE 4X4 16PLY XRAY LF (GAUZE/BANDAGES/DRESSINGS) ×2 IMPLANT
GLOVE BIOGEL M 7.0 STRL (GLOVE) ×2 IMPLANT
GLOVE BIOGEL PI IND STRL 7.0 (GLOVE) ×2 IMPLANT
GLOVE BIOGEL PI IND STRL 9 (GLOVE) ×1 IMPLANT
GLOVE BIOGEL PI INDICATOR 7.0 (GLOVE) ×2
GLOVE BIOGEL PI INDICATOR 9 (GLOVE) ×1
GLOVE ECLIPSE 9.0 STRL (GLOVE) ×2 IMPLANT
GOWN SPEC L3 XXLG W/TWL (GOWN DISPOSABLE) ×2 IMPLANT
GOWN STRL REUS W/TWL LRG LVL3 (GOWN DISPOSABLE) ×2 IMPLANT
HANDPIECE ABLA MINERVA ENDO (MISCELLANEOUS) ×2 IMPLANT
INST SET HYSTEROSCOPY (KITS) ×2 IMPLANT
IV NS IRRIG 3000ML ARTHROMATIC (IV SOLUTION) ×2 IMPLANT
KIT TURNOVER CYSTO (KITS) ×2 IMPLANT
KIT TURNOVER KIT A (KITS) ×2 IMPLANT
MANIFOLD NEPTUNE II (INSTRUMENTS) ×2 IMPLANT
NS IRRIG 1000ML POUR BTL (IV SOLUTION) ×2 IMPLANT
PACK PERI GYN (CUSTOM PROCEDURE TRAY) ×2 IMPLANT
PAD ARMBOARD 7.5X6 YLW CONV (MISCELLANEOUS) ×2 IMPLANT
PAD TELFA 3X4 1S STER (GAUZE/BANDAGES/DRESSINGS) ×2 IMPLANT
SET BASIN LINEN APH (SET/KITS/TRAYS/PACK) ×2 IMPLANT
SET IRRIG Y TYPE TUR BLADDER L (SET/KITS/TRAYS/PACK) ×2 IMPLANT
SYR CONTROL 10ML LL (SYRINGE) ×2 IMPLANT

## 2018-09-10 NOTE — Discharge Instructions (Signed)
Endometrial Ablation Endometrial ablation is a procedure that destroys the thin inner layer of the lining of the uterus (endometrium). This procedure may be done:  To stop heavy periods.  To stop bleeding that is causing anemia.  To control irregular bleeding.  To treat bleeding caused by small tumors (fibroids) in the endometrium.  This procedure is often an alternative to major surgery, such as removal of the uterus and cervix (hysterectomy). As a result of this procedure:  You may not be able to have children. However, if you are premenopausal (you have not gone through menopause): ? You may still have a small chance of getting pregnant. ? You will need to use a reliable method of birth control after the procedure to prevent pregnancy.  You may stop having a menstrual period, or you may have only a small amount of bleeding during your period. Menstruation may return several years after the procedure.  Tell a health care provider about:  Any allergies you have.  All medicines you are taking, including vitamins, herbs, eye drops, creams, and over-the-counter medicines.  Any problems you or family members have had with the use of anesthetic medicines.  Any blood disorders you have.  Any surgeries you have had.  Any medical conditions you have. What are the risks? Generally, this is a safe procedure. However, problems may occur, including:  A hole (perforation) in the uterus or bowel.  Infection of the uterus, bladder, or vagina.  Bleeding.  Damage to other structures or organs.  An air bubble in the lung (air embolus).  Problems with pregnancy after the procedure.  Failure of the procedure.  Decreased ability to diagnose cancer in the endometrium.  What happens before the procedure?  You will have tests of your endometrium to make sure there are no pre-cancerous cells or cancer cells present.  You may have an ultrasound of the uterus.  You may be given  medicines to thin the endometrium.  Ask your health care provider about: ? Changing or stopping your regular medicines. This is especially important if you take diabetes medicines or blood thinners. ? Taking medicines such as aspirin and ibuprofen. These medicines can thin your blood. Do not take these medicines before your procedure if your doctor tells you not to.  Plan to have someone take you home from the hospital or clinic. What happens during the procedure?  You will lie on an exam table with your feet and legs supported as in a pelvic exam.  To lower your risk of infection: ? Your health care team will wash or sanitize their hands and put on germ-free (sterile) gloves. ? Your genital area will be washed with soap.  An IV tube will be inserted into one of your veins.  You will be given a medicine to help you relax (sedative).  A surgical instrument with a light and camera (resectoscope) will be inserted into your vagina and moved into your uterus. This allows your surgeon to see inside your uterus.  Endometrial tissue will be removed using one of the following methods: ? Radiofrequency. This method uses a radiofrequency-alternating electric current to remove the endometrium. ? Cryotherapy. This method uses extreme cold to freeze the endometrium. ? Heated-free liquid. This method uses a heated saltwater (saline) solution to remove the endometrium. ? Microwave. This method uses high-energy microwaves to heat up the endometrium and remove it. ? Thermal balloon. This method involves inserting a catheter with a balloon tip into the uterus. The balloon tip is   filled with heated fluid to remove the endometrium. The procedure may vary among health care providers and hospitals. What happens after the procedure?  Your blood pressure, heart rate, breathing rate, and blood oxygen level will be monitored until the medicines you were given have worn off.  As tissue healing occurs, you may  notice vaginal bleeding for 4-6 weeks after the procedure. You may also experience: ? Cramps. ? Thin, watery vaginal discharge that is light pink or brown in color. ? A need to urinate more frequently than usual. ? Nausea.  Do not drive for 24 hours if you were given a sedative.  Do not have sex or insert anything into your vagina until your health care provider approves. Summary  Endometrial ablation is done to treat the many causes of heavy menstrual bleeding.  The procedure may be done only after medications have been tried to control the bleeding.  Plan to have someone take you home from the hospital or clinic. This information is not intended to replace advice given to you by your health care provider. Make sure you discuss any questions you have with your health care provider. Document Released: 10/20/2004 Document Revised: 12/28/2016 Document Reviewed: 12/28/2016 Elsevier Interactive Patient Education  2017 Elsevier Inc.  

## 2018-09-10 NOTE — Anesthesia Postprocedure Evaluation (Signed)
Anesthesia Post Note  Patient: [name]  Procedure(s) Performed: DILATATION & CURETTAGE/HYSTEROSCOPY WITH MINERVA  ENDOMETRIAL ABLATION (N/A Vagina )  Patient location during evaluation: PACU Anesthesia Type: General Level of consciousness: awake and alert and patient cooperative Pain management: pain level controlled Vital Signs Assessment: post-procedure vital signs reviewed and stable Respiratory status: spontaneous breathing, nonlabored ventilation and respiratory function stable Cardiovascular status: blood pressure returned to baseline Postop Assessment: no apparent nausea or vomiting Anesthetic complications: no     Last Vitals:  Vitals:   09/10/18 0640 09/10/18 0700  BP: (!) 171/117 (!) 148/84  Pulse: 74   Resp: 16 16  Temp: 36.7 C   SpO2: 95% 95%    Last Pain:  Vitals:   09/10/18 0640  TempSrc: Oral  PainSc: 0-No pain                 Alexah Kivett J

## 2018-09-10 NOTE — H&P (Signed)
Expand All Collapse All   Patient ID: Shelly Larson, female   DOB: 11-16-1971, 47 y.o.   MRN: [id number]  Preoperative History and Physical  Shelly Larson is a 47 y.o. G0P0000 here for surgical management of Menorrhagia.   No significant preoperative concerns. Her Last period began yesterday 08/13/18 Proposed surgery: Endometrial ablation      Past Medical History:  Diagnosis Date  . Biliary acute pancreatitis 2010  . Colon cancer ([place]) 2016   Transverse colon   . Dyspnea   . Hypertension   . Migraine         Past Surgical History:  Procedure Laterality Date  . CHOLECYSTECTOMY  2010   [place], biliary pancreatitis  . COLECTOMY  09/30/2015   distal transverse d/t adenocarcinoma  . COLONOSCOPY N/A 06/11/2018   Procedure: COLONOSCOPY;  Surgeon: Daneil Dolin, MD;  [facility];  Service: Endoscopy;  Laterality: N/A;  1:45pm  . POLYPECTOMY  06/11/2018   Procedure: POLYPECTOMY;  Surgeon: Daneil Dolin, MD;  Location: [facility];  Service: Endoscopy;;  colon          OB History  Gravida Para Term Preterm AB Living  0 0 0 0 0 0  SAB TAB Ectopic Multiple Live Births   0 0 0 0 0   Patient denies any other pertinent gynecologic issues.         Current Outpatient Medications on File Prior to Visit  Medication Sig Dispense Refill  . albuterol (PROAIR HFA) 108 (90 Base) MCG/ACT inhaler Inhale 2 puffs into the lungs every 4 (four) hours as needed.    Marland Kitchen amLODipine (NORVASC) 5 MG tablet TAKE 1 TABLET BY MOUTH EVERY DAY 30 tablet 2  . amphetamine-dextroamphetamine (ADDERALL XR) 20 MG 24 hr capsule Take 20 mg by mouth daily.     . fluticasone furoate-vilanterol (BREO ELLIPTA) 100-25 MCG/INH AEPB Inhale 1 puff into the lungs daily.    Marland Kitchen losartan-hydrochlorothiazide (HYZAAR) 50-12.5 MG tablet Take 1 tablet by mouth daily.     . metFORMIN (GLUCOPHAGE) 500 MG tablet Take 1 tablet (500 mg total) by mouth 2 (two) times daily with a meal. 60 tablet 2   . metoprolol succinate (TOPROL-XL) 50 MG 24 hr tablet Take 50 mg by mouth daily.  0  . nitroGLYCERIN (NITROSTAT) 0.4 MG SL tablet Place 0.4 mg under the tongue every 5 (five) minutes as needed.     . topiramate (TOPAMAX) 50 MG tablet TAKE 1 TABLET BY MOUTH AT BEDTIME FOR MIGRAINE PREVENTION  0   No current facility-administered medications on file prior to visit.    No Known Allergies  Social History:   reports that she has never smoked. She has never used smokeless tobacco. She reports that she drinks alcohol. She reports that she does not use drugs.       Family History  Problem Relation Age of Onset  . Hypertension Mother   . Diabetes Mellitus II Mother   . Heart disease Sister        dx in late 88's  . Breast cancer Paternal Grandmother   . Breast cancer Paternal Aunt   . Colon cancer Neg Hx     Review of Systems: Noncontributory  PHYSICAL EXAM: Last menstrual period 07/16/2018. General appearance - alert, well appearing, and in no distress Chest - clear to auscultation, no wheezes, rales or rhonchi, symmetric air entry Heart - normal rate and regular rhythm Abdomen - soft, nontender, nondistended, no masses or organomegaly, railroad  track scar due to colon resection Pelvic:  VAGINA: , good support, elongate CERVIX: normal appearing cervix without discharge or lesions. UTERUS: Anteflexed, upper limits to normal size  Extremities - peripheral pulses normal, no pedal edema, no clubbing or cyanosis  Labs: CBC    Component Value Date/Time   WBC 5.8 09/04/2018 1600   RBC 4.95 09/04/2018 1600   HGB 13.4 09/04/2018 1600   HCT 41.5 09/04/2018 1600   PLT 162 09/04/2018 1600   MCV 83.8 09/04/2018 1600   MCH 27.1 09/04/2018 1600   MCHC 32.3 09/04/2018 1600   RDW 15.2 09/04/2018 1600   LYMPHSABS 1.0 11/22/2010 0603   MONOABS 0.6 11/22/2010 0603   EOSABS 0.1 11/22/2010 0603   BASOSABS 0.0 11/22/2010 0603   CMP Latest Ref Rng & Units 09/04/2018  05/03/2018 11/21/2010  Glucose 70 - 99 mg/dL 102(H) - 107(H)  BUN 6 - 20 mg/dL 11 9 7   Creatinine 0.44 - 1.00 mg/dL 0.88 0.8 0.9  Sodium 135 - 145 mmol/L 136 - 138  Potassium 3.5 - 5.1 mmol/L 3.4(L) - 3.1(L)  Chloride 98 - 111 mmol/L 106 - 99  CO2 22 - 32 mmol/L 23 - -  Calcium 8.9 - 10.3 mg/dL 8.8(L) - -  Total Protein 6.5 - 8.1 g/dL 7.1 - -  Total Bilirubin 0.3 - 1.2 mg/dL 0.4 - -  Alkaline Phos 38 - 126 U/L 56 - -  AST 15 - 41 U/L 15 - -  ALT 0 - 44 U/L 24 - -    HGB A1C 6.2   Imaging Studies:  ImagingResults  [place] Sonohysterogram  Result Date: 07/31/2018 CLINICAL DATA:  Menometrorrhagia.  Uterine fibroids. EXAM: [place] SONOHYSTEROGRAM TECHNIQUE: Sonohysterogram was performed by the ordering physician. Ultrasound images were submitted for interpretation following the procedure. COMPARISON:  06/05/2018 FINDINGS: 2D images and cine clips obtained during sonohysterogram show smooth is endometrial lining, with no evidence of polyps or focal areas of thickening. Double layer endometrial thickness measures 8 mm. Although a few small fibroids were better seen on recent pelvic ultrasound, no submucosal or intracavitary fibroids are visualized on this exam. IMPRESSION: No focal endometrial pathology identified. No submucosal or intracavitary fibroids. Electronically Signed   By: Earle Gell M.D.   On: 07/31/2018 09:01     Assessment:     Patient Active Problem List   Diagnosis Date Noted  . Prediabetes 07/09/2018  . Class 2 severe obesity due to excess calories with serious comorbidity and body mass index (BMI) of 37.0 to 37.9 in adult ([place]) 07/09/2018  . Essential hypertension, benign 07/09/2018  . Mixed hyperlipidemia 07/09/2018  . H/O colon cancer, stage II 05/07/2018  . Change in bowel function 10/16/2017  . Hemorrhoid prolapse 10/16/2017  . Rectal bleeding 10/16/2017  . Colon cancer ([place]) 12/25/2014    Plan: 1. Rx: provera 5 mg once daily for 3 weeks 2. Refill Rx  Tranexamic 3. F/u 3 weeks around (09/03/18) for Endometrial Ablation 4. Patient will undergo surgical management with menorrhagia.    ENDOMETRIAL BIOPSY Patient given informed consent, signed copy in the chart, time out was performed. Appropriate time out taken. . The patient was placed in the lithotomy position and the cervix brought into view with sterile speculum.  Portio of cervix cleansed x 2 with betadine swabs.  A tenaculum was placed in the anterior lip of the cervix.  The uterus was sounded for depth of 7 cm. A pipelle was introduced to into the uterus, suction created,  and an endometrial sample  was obtained. All equipment was removed and accounted for.  The patient tolerated the procedure well.    By signing my name below, I, Samul Dada, attest that this documentation has been prepared under the direction and in the presence of Jonnie Kind, MD. Electronically Signed: Black. 08/14/18. 1:36 PM.  I personally performed the services described in this documentation, which was SCRIBED in my presence. The recorded information has been reviewed and considered accurate. It has been edited as necessary during review. Jonnie Kind, MD

## 2018-09-10 NOTE — Op Note (Signed)
Please see the brief operative note for surgical details 

## 2018-09-10 NOTE — Anesthesia Procedure Notes (Signed)
Procedure Name: LMA Insertion Date/Time: 09/10/2018 7:44 AM Performed by: Charmaine Downs, CRNA Pre-anesthesia Checklist: Patient identified, Patient being monitored, Emergency Drugs available, Timeout performed and Suction available Patient Re-evaluated:Patient Re-evaluated prior to induction Oxygen Delivery Method: Circle System Utilized Preoxygenation: Pre-oxygenation with 100% oxygen Induction Type: IV induction Ventilation: Mask ventilation without difficulty LMA: LMA inserted LMA Size: 4.0 Number of attempts: 1 Placement Confirmation: positive ETCO2 and breath sounds checked- equal and bilateral Tube secured with: Tape Dental Injury: Teeth and Oropharynx as per pre-operative assessment

## 2018-09-10 NOTE — Transfer of Care (Signed)
Immediate Anesthesia Transfer of Care Note  Patient: Shelly Larson  Procedure(s) Performed: DILATATION & CURETTAGE/HYSTEROSCOPY WITH MINERVA  ENDOMETRIAL ABLATION (N/A Vagina )  Patient Location: PACU  Anesthesia Type:General  Level of Consciousness: awake, alert  and patient cooperative  Airway & Oxygen Therapy: Patient Spontanous Breathing  Post-op Assessment: Report given to RN, Post -op Vital signs reviewed and stable and Patient moving all extremities  Post vital signs: Reviewed and stable  Last Vitals:  Vitals Value Taken Time  BP    Temp    Pulse 76 09/10/2018  8:29 AM  Resp 11 09/10/2018  8:29 AM  SpO2 96 % 09/10/2018  8:29 AM  Vitals shown include unvalidated device data.  Last Pain:  Vitals:   09/10/18 0640  TempSrc: Oral  PainSc: 0-No pain      Patients Stated Pain Goal: 6 (53/61/44 3154)  Complications: No apparent anesthesia complications

## 2018-09-10 NOTE — Op Note (Signed)
09/10/2018  8:28 AM  PATIENT:  Shelly Larson  47 y.o. female  PRE-OPERATIVE DIAGNOSIS:  Menorrhagia  POST-OPERATIVE DIAGNOSIS:  Menorrhagia  PROCEDURE:  Procedure(s): DILATATION & CURETTAGE/HYSTEROSCOPY WITH MINERVA  ENDOMETRIAL ABLATION (N/A)  SURGEON:  Surgeon(s) and Role:    * Jonnie Kind, MD - Primary  PHYSICIAN ASSISTANT:   ASSISTANTS: none   ANESTHESIA:   local, general and paracervical block  EBL:  10 mL   BLOOD ADMINISTERED:none  DRAINS: none   LOCAL MEDICATIONS USED:  MARCAINE    and Amount: 20 ml  SPECIMEN:  No Specimen  DISPOSITION OF SPECIMEN:  N/A  COUNTS:  YES  TOURNIQUET:  * No tourniquets in log *  DICTATION: .Dragon Dictation  PLAN OF CARE: Discharge to home after PACU  PATIENT DISPOSITION:  PACU - hemodynamically stable.   Delay start of Pharmacological VTE agent (>24hrs) due to surgical blood loss or risk of bleeding: not applicable  Details of procedure: Patient was taken to the operating room prepped and draped for vaginal procedure with timeout conducted and procedure confirmed by surgical team.  Ancef 3 g was administered.  The speculum was inserted the cervix grasped.  The cervix is well supported.  The uterus was sounded to 9 cm in the anteflexed position, dilated to 23 [place] allowing introduction of the 30 degree operative hysteroscope which confirmed a smooth endometrial cavity without any suspicious areas.  The tubal ostia were visualized bilaterally.  There was no suspicion of perforation.  The brief curettage was performed.  The amount of tissue obtained was minimal.  We have previously biopsied the area so that no specimen was sent for histology.  Paracervical block was then applied using 20 cc of Marcaine.  The Minerva endometrial ablation device was then prepared set at 4.5 cm of length for the endometrial cavity, prepared inserted activated in the 120 seconds time sequence completed without difficulty.  The device was removed, and  traditional discolored surface debris on the in the Minerva device was confirmed.  The the patient was hemostatic tenaculum was removed and speculum were removed and patient allowed to go to recovery room and sponge and needle counts were correct.

## 2018-09-10 NOTE — Anesthesia Preprocedure Evaluation (Signed)
Anesthesia Evaluation  Patient identified by MRN, date of birth, ID band Patient awake    Reviewed: Allergy & Precautions, H&P , NPO status , Patient's Chart, lab work & pertinent test results  Airway Mallampati: I  TM Distance: >3 FB Neck ROM: full    Dental no notable dental hx.    Pulmonary neg pulmonary ROS, shortness of breath,    Pulmonary exam normal breath sounds clear to auscultation       Cardiovascular Exercise Tolerance: Good hypertension, negative cardio ROS   Rhythm:regular Rate:Normal     Neuro/Psych  Headaches, negative neurological ROS  negative psych ROS   GI/Hepatic negative GI ROS, Neg liver ROS,   Endo/Other  negative endocrine ROSdiabetes, Type 2  Renal/GU negative Renal ROS  negative genitourinary   Musculoskeletal   Abdominal   Peds  Hematology negative hematology ROS (+)   Anesthesia Other Findings   Reproductive/Obstetrics negative OB ROS                             Anesthesia Physical Anesthesia Plan  ASA: III  Anesthesia Plan: General   Post-op Pain Management:    Induction:   PONV Risk Score and Plan:   Airway Management Planned:   Additional Equipment:   Intra-op Plan:   Post-operative Plan:   Informed Consent: I have reviewed the patients History and Physical, chart, labs and discussed the procedure including the risks, benefits and alternatives for the proposed anesthesia with the patient or authorized representative who has indicated his/her understanding and acceptance.   Dental Advisory Given  Plan Discussed with: CRNA  Anesthesia Plan Comments:         Anesthesia Quick Evaluation

## 2018-09-10 NOTE — Interval H&P Note (Signed)
History and Physical Interval Note:  09/10/2018 7:13 AM  Shelly Larson  has presented today for surgery, with the diagnosis of Menometrorrhagia  The various methods of treatment have been discussed with the patient and family. After consideration of risks, benefits and other options for treatment, the patient has consented to  Procedure(s): DILATATION & CURETTAGE/HYSTEROSCOPY WITH MINERVA  ABLATION (N/A) as a surgical intervention .  The patient's history has been reviewed, patient examined, no change in status, stable for surgery. Her last Menses began last week , Wednesday.  I have reviewed the patient's chart and labs.  Questions were answered to the patient's satisfaction.     Jonnie Kind

## 2018-09-11 ENCOUNTER — Encounter: Payer: Managed Care, Other (non HMO) | Admitting: Obstetrics and Gynecology

## 2018-09-11 ENCOUNTER — Encounter (HOSPITAL_COMMUNITY): Payer: Self-pay | Admitting: Obstetrics and Gynecology

## 2018-09-18 ENCOUNTER — Ambulatory Visit (INDEPENDENT_AMBULATORY_CARE_PROVIDER_SITE_OTHER): Payer: Managed Care, Other (non HMO) | Admitting: Obstetrics and Gynecology

## 2018-09-18 ENCOUNTER — Encounter: Payer: Self-pay | Admitting: Obstetrics and Gynecology

## 2018-09-18 VITALS — BP 149/91 | HR 82 | Ht 71.0 in | Wt 267.2 lb

## 2018-09-18 DIAGNOSIS — Z9889 Other specified postprocedural states: Secondary | ICD-10-CM | POA: Diagnosis not present

## 2018-09-18 DIAGNOSIS — Z09 Encounter for follow-up examination after completed treatment for conditions other than malignant neoplasm: Secondary | ICD-10-CM

## 2018-09-18 NOTE — Progress Notes (Signed)
Patient ID: Shelly Larson, female   DOB: 03-27-1971, 47 y.o.   MRN: [id number]     Subjective:  Shelly Larson is a 47 y.o. female now 1 weeks status post DILATATION & CURETTAGE/HYSTEROSCOPY WITH MINERVA  ENDOMETRIAL ABLATION.   Cramped 2 days and vomiting and pooping while vomiting after surgery. Went back to work Monday 09/16/18 No fever, no blood, light pinkish discharge Review of Systems Negative except    Diet:   Now is normal; had diarrhea episodes after surgery, could not eat without immediately having to go to the restroom.   Bowel movements : normal.  The patient is not having any pain.  Objective:  BP (!) 149/91 (BP Location: Left Arm, Patient Position: Sitting, Cuff Size: Normal)   Pulse 82   Ht 5\' 11"  (1.803 m)   Wt 267 lb 3.2 oz (121.2 kg)   BMI 37.27 kg/m  General:Well developed, well nourished.  No acute distress. Abdomen: Bowel sounds normal, soft, non-tender. Pelvic Exam: NOT DONE  Incision(s): N/A     Assessment:  Post-Op 1 weeks s/p DILATATION & CURETTAGE/HYSTEROSCOPY WITH MINERVA  ENDOMETRIAL ABLATION   Doing well postoperatively.   Plan:  1. Current medications. Ibeprofeun for cramping. 2. Activity restrictions: none 3. return to work: not applicable. 4. Follow up in 6 months with phone call.  By signing my name below, I, Samul Dada, attest that this documentation has been prepared under the direction and in the presence of Jonnie Kind, MD. Electronically Signed: [place]. 09/18/18. 9:15 AM.  I personally performed the services described in this documentation, which was SCRIBED in my presence. The recorded information has been reviewed and considered accurate. It has been edited as necessary during review. Jonnie Kind, MD

## 2018-10-01 DIAGNOSIS — N62 Hypertrophy of breast: Secondary | ICD-10-CM | POA: Insufficient documentation

## 2018-10-04 ENCOUNTER — Other Ambulatory Visit: Payer: Self-pay | Admitting: "Endocrinology

## 2018-10-09 ENCOUNTER — Ambulatory Visit: Payer: Managed Care, Other (non HMO) | Admitting: "Endocrinology

## 2018-10-15 ENCOUNTER — Ambulatory Visit (INDEPENDENT_AMBULATORY_CARE_PROVIDER_SITE_OTHER): Payer: Managed Care, Other (non HMO) | Admitting: Internal Medicine

## 2018-10-15 ENCOUNTER — Encounter: Payer: Self-pay | Admitting: Internal Medicine

## 2018-10-15 VITALS — BP 178/101 | HR 81 | Temp 97.8°F | Ht 71.0 in | Wt 268.0 lb

## 2018-10-15 DIAGNOSIS — K648 Other hemorrhoids: Secondary | ICD-10-CM | POA: Diagnosis not present

## 2018-10-15 NOTE — Patient Instructions (Signed)
Begin Benefiber 1 tablespoon daily   Limit toilet time to 5 minutes  Call with any interim problems  Schedule followup appointment in 4 weeks from now

## 2018-10-15 NOTE — Progress Notes (Signed)
  [place] banding procedure note:  The patient presents with symptomatic grade 3 hemorrhoids, unresponsive to maximal medical therapy, requesting rubber band ligation of her hemorrhoidal disease. All risks, benefits, and alternative forms of therapy were described and informed consent was obtained.  In the left lateral decubitus position, DRE revealed a single grade 3 hemorrhoidal tag otherwise negative.  Anoscopy confirmed prominent redundant right anterior hemorrhoid.  The decision was made to band the right anterior internal hemorrhoid;the [place] was used to perform band ligation without complication. Digital anorectal examination was then performed to assure proper positioning of the band; band was minimally engaged.  I subsequently placed another band in the similar position with better placement.  No pinching or pain.  Finally I placed a left lateral hemorrhoid column rubber band good placement on follow-up DRE.  No pinching or pain.  Dietary and behavioral recommendations were given and  Daily fiber supplementation recommended.  Office visit in 4 weeks. No complications were encountered and the patient tolerated the procedure well.

## 2018-10-16 ENCOUNTER — Encounter: Payer: Self-pay | Admitting: Internal Medicine

## 2018-10-19 ENCOUNTER — Emergency Department (HOSPITAL_COMMUNITY)
Admission: EM | Admit: 2018-10-19 | Discharge: 2018-10-19 | Disposition: A | Discharge status: Home / Self Care | Payer: Managed Care, Other (non HMO) | Attending: Emergency Medicine | Admitting: Emergency Medicine

## 2018-10-19 ENCOUNTER — Encounter (HOSPITAL_COMMUNITY): Payer: Self-pay | Admitting: Emergency Medicine

## 2018-10-19 DIAGNOSIS — Z79899 Other long term (current) drug therapy: Secondary | ICD-10-CM | POA: Insufficient documentation

## 2018-10-19 DIAGNOSIS — E119 Type 2 diabetes mellitus without complications: Secondary | ICD-10-CM | POA: Insufficient documentation

## 2018-10-19 DIAGNOSIS — Z7984 Long term (current) use of oral hypoglycemic drugs: Secondary | ICD-10-CM | POA: Diagnosis not present

## 2018-10-19 DIAGNOSIS — J029 Acute pharyngitis, unspecified: Secondary | ICD-10-CM | POA: Diagnosis not present

## 2018-10-19 DIAGNOSIS — I10 Essential (primary) hypertension: Secondary | ICD-10-CM | POA: Insufficient documentation

## 2018-10-19 DIAGNOSIS — R070 Pain in throat: Secondary | ICD-10-CM | POA: Diagnosis present

## 2018-10-19 LAB — BASIC METABOLIC PANEL
Anion gap: 5 (ref 5–15)
BUN: 10 mg/dL (ref 6–20)
CO2: 25 mmol/L (ref 22–32)
CREATININE: 0.71 mg/dL (ref 0.44–1.00)
Calcium: 8.7 mg/dL — ABNORMAL LOW (ref 8.9–10.3)
Chloride: 104 mmol/L (ref 98–111)
GLUCOSE: 95 mg/dL (ref 70–99)
Potassium: 3.1 mmol/L — ABNORMAL LOW (ref 3.5–5.1)
Sodium: 134 mmol/L — ABNORMAL LOW (ref 135–145)

## 2018-10-19 LAB — CBC WITH DIFFERENTIAL/PLATELET
Abs Immature Granulocytes: 0.03 10*3/uL (ref 0.00–0.07)
BASOS ABS: 0 10*3/uL (ref 0.0–0.1)
BASOS PCT: 0 %
EOS ABS: 0.1 10*3/uL (ref 0.0–0.5)
EOS PCT: 2 %
HEMATOCRIT: 42.2 % (ref 36.0–46.0)
Hemoglobin: 13.1 g/dL (ref 12.0–15.0)
Immature Granulocytes: 1 %
Lymphocytes Relative: 12 %
Lymphs Abs: 0.8 10*3/uL (ref 0.7–4.0)
MCH: 26.5 pg (ref 26.0–34.0)
MCHC: 31 g/dL (ref 30.0–36.0)
MCV: 85.4 fL (ref 80.0–100.0)
Monocytes Absolute: 0.4 10*3/uL (ref 0.1–1.0)
Monocytes Relative: 7 %
NEUTROS PCT: 78 %
NRBC: 0 % (ref 0.0–0.2)
Neutro Abs: 5.1 10*3/uL (ref 1.7–7.7)
PLATELETS: 127 10*3/uL — AB (ref 150–400)
RBC: 4.94 MIL/uL (ref 3.87–5.11)
RDW: 14.6 % (ref 11.5–15.5)
WBC: 6.4 10*3/uL (ref 4.0–10.5)

## 2018-10-19 LAB — CBG MONITORING, ED: Glucose-Capillary: 82 mg/dL (ref 70–99)

## 2018-10-19 LAB — GROUP A STREP BY PCR: Group A Strep by PCR: NOT DETECTED

## 2018-10-19 MED ORDER — ONDANSETRON HCL 4 MG/2ML IJ SOLN
4.0000 mg | Freq: Once | INTRAMUSCULAR | Status: AC
  Administered 2018-10-19: 4 mg via INTRAVENOUS
  Filled 2018-10-19: qty 2

## 2018-10-19 MED ORDER — KETOROLAC TROMETHAMINE 30 MG/ML IJ SOLN
30.0000 mg | Freq: Once | INTRAMUSCULAR | Status: AC
  Administered 2018-10-19: 30 mg via INTRAVENOUS
  Filled 2018-10-19: qty 1

## 2018-10-19 MED ORDER — HYDROMORPHONE HCL 1 MG/ML IJ SOLN: 1.0000 mg | Freq: Once | INTRAMUSCULAR | Status: DC

## 2018-10-19 MED ORDER — PREDNISONE 50 MG PO TABS: ORAL_TABLET | ORAL | 0 refills | Status: DC

## 2018-10-19 MED ORDER — SODIUM CHLORIDE 0.9 % IV BOLUS
1000.0000 mL | Freq: Once | INTRAVENOUS | Status: AC
  Administered 2018-10-19: 1000 mL via INTRAVENOUS

## 2018-10-19 MED ORDER — OXYCODONE-ACETAMINOPHEN 5-325 MG PO TABS: 1.0000 | ORAL_TABLET | ORAL | 0 refills | Status: DC | PRN

## 2018-10-19 MED ORDER — MORPHINE SULFATE (PF) 4 MG/ML IV SOLN
4.0000 mg | Freq: Once | INTRAVENOUS | Status: AC
  Administered 2018-10-19: 4 mg via INTRAVENOUS
  Filled 2018-10-19: qty 1

## 2018-10-19 MED ORDER — DEXAMETHASONE SODIUM PHOSPHATE 4 MG/ML IJ SOLN
8.0000 mg | Freq: Once | INTRAMUSCULAR | Status: AC
  Administered 2018-10-19: 8 mg via INTRAVENOUS
  Filled 2018-10-19: qty 2

## 2018-10-19 MED ORDER — HYDROMORPHONE HCL 1 MG/ML IJ SOLN
1.0000 mg | Freq: Once | INTRAMUSCULAR | Status: AC
  Administered 2018-10-19: 1 mg via INTRAVENOUS
  Filled 2018-10-19: qty 1

## 2018-10-19 NOTE — ED Notes (Signed)
Pt states her voice has become muffled in the past hour or 2.

## 2018-10-19 NOTE — ED Triage Notes (Signed)
Pt has had a sore throat since last night.  Went to pcp this morning and was given a shot of Rocephin.  Purulent drainage from tonsils.

## 2018-10-19 NOTE — Discharge Instructions (Addendum)
Increase fluids, gargle salt water, prescription for pain medicine and prednisone.  Can take ibuprofen.  Continue your antibiotic.  Return if worse.

## 2018-10-20 NOTE — ED Provider Notes (Signed)
[facility] EMERGENCY DEPARTMENT Provider Note   CSN: [id number] Arrival date & time: 10/19/18  1834     History   Chief Complaint Chief Complaint  Patient presents with  . Sore Throat    HPI Shelly Larson is a 47 y.o. female.  Patient presents with sore throat since last night.  She went to her primary care doctor this morning was given a shot of Rocephin and a prescription for cephalexin.  She is able to swallow.  No fever, sweats, chills.  Severity of pain is moderate.  Swallowing makes pain worse.     Past Medical History:  Diagnosis Date  . ADHD   . Anemia   . Arthritis   . Biliary acute pancreatitis 2010  . Colon cancer ([place]) 2016   Transverse colon   . Diabetes mellitus without complication ([place])   . Dyspnea   . Hypertension   . Migraine     Patient Active Problem List   Diagnosis Date Noted  . Menorrhagia with regular cycle 09/10/2018  . Prediabetes 07/09/2018  . Class 2 severe obesity due to excess calories with serious comorbidity and body mass index (BMI) of 37.0 to 37.9 in adult ([place]) 07/09/2018  . Essential hypertension, benign 07/09/2018  . Mixed hyperlipidemia 07/09/2018  . H/O colon cancer, stage II 05/07/2018  . Change in bowel function 10/16/2017  . Hemorrhoid prolapse 10/16/2017  . Rectal bleeding 10/16/2017  . Colon cancer ([place]) 12/25/2014    Past Surgical History:  Procedure Laterality Date  . BAND HEMORRHOIDECTOMY    . CHOLECYSTECTOMY  2010   [place], biliary pancreatitis  . COLECTOMY  09/30/2015   distal transverse d/t adenocarcinoma  . COLONOSCOPY N/A 06/11/2018   Procedure: COLONOSCOPY;  Surgeon: Daneil Dolin, MD;  Location: [facility] ENDO SUITE;  Service: Endoscopy;  Laterality: N/A;  1:45pm  . DILITATION & CURRETTAGE/HYSTROSCOPY WITH NOVASURE ABLATION N/A 09/10/2018   Procedure: DILATATION & CURETTAGE/HYSTEROSCOPY WITH MINERVA  ENDOMETRIAL ABLATION;  Surgeon: Jonnie Kind, MD;  Location: [facility] ORS;  Service: Gynecology;   Laterality: N/A;  . POLYPECTOMY  06/11/2018   Procedure: POLYPECTOMY;  Surgeon: Daneil Dolin, MD;  Location: [facility] ENDO SUITE;  Service: Endoscopy;;  colon     OB History    Gravida  0   Para  0   Term  0   Preterm  0   AB  0   Living  0     SAB  0   TAB  0   Ectopic  0   Multiple  0   Live Births  0            Home Medications    Prior to Admission medications   Medication Sig Start Date End Date Taking? Authorizing Provider  albuterol (PROAIR HFA) 108 (90 Base) MCG/ACT inhaler Inhale 2 puffs into the lungs every 4 (four) hours as needed for wheezing or shortness of breath.  10/17/17  Yes [provider]  amLODipine (NORVASC) 5 MG tablet TAKE 1 TABLET BY MOUTH EVERY DAY Patient taking differently: Take 5 mg by mouth every morning.  05/10/18  Yes Herminio Commons, MD  amphetamine-dextroamphetamine (ADDERALL XR) 20 MG 24 hr capsule Take 20 mg by mouth every morning.  05/03/18  Yes [provider]  fluticasone furoate-vilanterol (BREO ELLIPTA) 100-25 MCG/INH AEPB Inhale 1 puff into the lungs every morning.    Yes [provider]  losartan-hydrochlorothiazide (HYZAAR) 50-12.5 MG tablet Take 1 tablet by mouth every morning.  Yes [provider]  metFORMIN (GLUCOPHAGE) 500 MG tablet TAKE 1 TABLET (500 MG TOTAL) BY MOUTH 2 (TWO) TIMES DAILY WITH A MEAL. 10/04/18  Yes Cassandria Anger, MD  metoprolol succinate (TOPROL-XL) 50 MG 24 hr tablet Take 50 mg by mouth every morning.  09/19/17  Yes [provider]  nitroGLYCERIN (NITROSTAT) 0.4 MG SL tablet Place 0.4 mg under the tongue every 5 (five) minutes as needed.  08/21/17  Yes [provider]  OVER THE COUNTER MEDICATION Apply 1 application topically daily as needed (for pain hemmorrhoids). [facility] OTC    Yes [provider]  topiramate (TOPAMAX) 50 MG tablet Take 50 mg by mouth at bedtime.  09/19/17  Yes [provider]  tranexamic acid (LYSTEDA)  650 MG TABS tablet Take 1,300 mg by mouth See admin instructions. Take 2 tablet (1300 mg) by mouth 3 times daily for 5 days during [facility] 07/13/18  Yes [provider]  cephALEXin (KEFLEX) 500 MG capsule Take 500 mg by mouth 4 (four) times daily. 10/19/18   [provider]  oxyCODONE-acetaminophen (PERCOCET/ROXICET) 5-325 MG tablet Take 1 tablet by mouth every 4 (four) hours as needed for severe pain. 10/19/18   Nat Christen, MD  predniSONE (DELTASONE) 50 MG tablet 3 tabs for 3 days; 2 tabs for 3 days; 1 tab for 3 days 10/19/18   Nat Christen, MD    Family History Family History  Problem Relation Age of Onset  . Hypertension Mother   . Diabetes Mellitus II Mother   . Heart disease Sister        dx in late 55's  . Breast cancer Paternal Grandmother   . Breast cancer Paternal Aunt   . Colon cancer Neg Hx     Social History Social History   Tobacco Use  . Smoking status: Never Smoker  . Smokeless tobacco: Never Used  Substance Use Topics  . Alcohol use: Yes    Comment: rare  . Drug use: No     Allergies   Lisinopril   Review of Systems Review of Systems  All other systems reviewed and are negative.    Physical Exam Updated Vital Signs BP (!) 144/88   Pulse 70   Temp 99.4 F (37.4 C) (Oral)   Resp 17   Ht 5\' 11"  (1.803 m)   Wt 111.6 kg   LMP 09/05/2018   SpO2 97%   BMI 34.31 kg/m   Physical Exam  Constitutional: She is oriented to person, place, and time. She appears well-developed and well-nourished.  HENT:  Head: Normocephalic and atraumatic.  Tonsillar hypertrophy with white exudate; no peritonsillar abscess.  Eyes: Conjunctivae are normal.  Neck: Neck supple.  Cardiovascular: Normal rate and regular rhythm.  Pulmonary/Chest: Effort normal and breath sounds normal.  Abdominal: Soft. Bowel sounds are normal.  Musculoskeletal: Normal range of motion.  Neurological: She is alert and oriented to person, place, and time.  Skin: Skin is  warm and dry.  Psychiatric: She has a normal mood and affect. Her behavior is normal.  Nursing note and vitals reviewed.    ED Treatments / Results  Labs (all labs ordered are listed, but only abnormal results are displayed) Labs Reviewed  CBC WITH DIFFERENTIAL/PLATELET - Abnormal; Notable for the following components:      Result Value   Platelets 127 (*)    All other components within normal limits  BASIC METABOLIC PANEL - Abnormal; Notable for the following components:   Sodium 134 (*)  Potassium 3.1 (*)    Calcium 8.7 (*)    All other components within normal limits  GROUP A STREP BY PCR  CBG MONITORING, ED    EKG None  Radiology No results found.  Procedures Procedures (including critical care time)  Medications Ordered in ED Medications  sodium chloride 0.9 % bolus 1,000 mL (0 mLs Intravenous Stopped 10/19/18 2139)  ondansetron (ZOFRAN) injection 4 mg (4 mg Intravenous Given 10/19/18 1925)  morphine 4 MG/ML injection 4 mg (4 mg Intravenous Given 10/19/18 1928)  ketorolac (TORADOL) 30 MG/ML injection 30 mg (30 mg Intravenous Given 10/19/18 1926)  dexamethasone (DECADRON) injection 8 mg (8 mg Intravenous Given 10/19/18 1927)  sodium chloride 0.9 % bolus 1,000 mL (0 mLs Intravenous Stopped 10/19/18 2036)  HYDROmorphone (DILAUDID) injection 1 mg (1 mg Intravenous Given 10/19/18 2208)     Initial Impression / Assessment and Plan / ED Course  I have reviewed the triage vital signs and the nursing notes.  Pertinent labs & imaging results that were available during my care of the patient were reviewed by me and considered in my medical decision making (see chart for details).     Patient presents with exudative pharyngitis.  No evidence of peritonsillar abscess.  She responded well to 2 L of IV fluid, IV opiates, IV steroids, IV Toradol.  Will recommend continuation of cephalexin at home.  Discharge medications Percocet and prednisone.  Patient is stable at  discharge.  Final Clinical Impressions(s) / ED Diagnoses   Final diagnoses:  Exudative pharyngitis    ED Discharge Orders         Ordered    oxyCODONE-acetaminophen (PERCOCET/ROXICET) 5-325 MG tablet  Every 4 hours PRN     10/19/18 2331    predniSONE (DELTASONE) 50 MG tablet     10/19/18 2331           Nat Christen, MD 10/20/18 2002

## 2018-10-21 ENCOUNTER — Emergency Department (HOSPITAL_COMMUNITY)
Admission: EM | Admit: 2018-10-21 | Discharge: 2018-10-21 | Discharge status: Against Medical Advice | Payer: Managed Care, Other (non HMO) | Attending: Emergency Medicine | Admitting: Emergency Medicine

## 2018-10-21 ENCOUNTER — Encounter (HOSPITAL_COMMUNITY): Payer: Self-pay | Admitting: *Deleted

## 2018-10-21 DIAGNOSIS — J029 Acute pharyngitis, unspecified: Secondary | ICD-10-CM | POA: Diagnosis present

## 2018-10-21 DIAGNOSIS — E119 Type 2 diabetes mellitus without complications: Secondary | ICD-10-CM | POA: Insufficient documentation

## 2018-10-21 DIAGNOSIS — F909 Attention-deficit hyperactivity disorder, unspecified type: Secondary | ICD-10-CM | POA: Insufficient documentation

## 2018-10-21 DIAGNOSIS — Z79899 Other long term (current) drug therapy: Secondary | ICD-10-CM | POA: Insufficient documentation

## 2018-10-21 DIAGNOSIS — I10 Essential (primary) hypertension: Secondary | ICD-10-CM | POA: Insufficient documentation

## 2018-10-21 LAB — BASIC METABOLIC PANEL
ANION GAP: 9 (ref 5–15)
BUN: 11 mg/dL (ref 6–20)
CHLORIDE: 104 mmol/L (ref 98–111)
CO2: 26 mmol/L (ref 22–32)
Calcium: 8.8 mg/dL — ABNORMAL LOW (ref 8.9–10.3)
Creatinine, Ser: 0.73 mg/dL (ref 0.44–1.00)
GFR calc Af Amer: >60 mL/min (ref >60)
GFR calc non Af Amer: >60 mL/min (ref >60)
Glucose, Bld: 88 mg/dL (ref 70–99)
POTASSIUM: 3.1 mmol/L — AB (ref 3.5–5.1)
Sodium: 139 mmol/L (ref 135–145)

## 2018-10-21 LAB — CBC WITH DIFFERENTIAL/PLATELET
ABS IMMATURE GRANULOCYTES: 0.04 10*3/uL (ref 0.00–0.07)
BASOS ABS: 0 10*3/uL (ref 0.0–0.1)
Basophils Relative: 0 %
EOS PCT: 1 %
Eosinophils Absolute: 0.1 10*3/uL (ref 0.0–0.5)
HCT: 40.5 % (ref 36.0–46.0)
HEMOGLOBIN: 12.3 g/dL (ref 12.0–15.0)
Immature Granulocytes: 1 %
LYMPHS PCT: 26 %
Lymphs Abs: 1.9 10*3/uL (ref 0.7–4.0)
MCH: 26.3 pg (ref 26.0–34.0)
MCHC: 30.4 g/dL (ref 30.0–36.0)
MCV: 86.5 fL (ref 80.0–100.0)
MONO ABS: 0.6 10*3/uL (ref 0.1–1.0)
Monocytes Relative: 8 %
NEUTROS ABS: 4.8 10*3/uL (ref 1.7–7.7)
Neutrophils Relative %: 64 %
Platelets: 144 10*3/uL — ABNORMAL LOW (ref 150–400)
RBC: 4.68 MIL/uL (ref 3.87–5.11)
RDW: 14.8 % (ref 11.5–15.5)
WBC: 7.3 10*3/uL (ref 4.0–10.5)
nRBC: 0 % (ref 0.0–0.2)

## 2018-10-21 NOTE — ED Notes (Signed)
Family at bedside. 

## 2018-10-21 NOTE — ED Triage Notes (Signed)
Pt in c/o sore throat since Friday, went to her PCP and then [place] Saturday evening after she developed a fever, has had multiple negative strep cultures, sent here to r/o abscess

## 2018-10-21 NOTE — ED Notes (Addendum)
Pt left per Phlebotomist.  Dr. Lavell Islam notified

## 2018-10-21 NOTE — ED Notes (Signed)
Pt reports sore throat x 3 days - Friday.  She was seen in the ED Saturday d/t worsening pain with fever.  She has been taking steroid, tylenol and motrin and percocet for pain without any relief.  She reports spitting up "greenish yellow" stuff out.

## 2018-10-22 NOTE — ED Provider Notes (Signed)
[facility] EMERGENCY DEPARTMENT Provider Note   CSN: [id number] Arrival date & time: 10/21/18  1354     History   Chief Complaint Chief Complaint  Patient presents with  . Sore Throat    HPI Shelly Larson is a 47 y.o. female.  HPI Patient is a 48 year old female with a past medical history of ADHD, diabetes, and hypertension who presents to the emergency department for evaluation of sore throat.  Patient has been evaluated multiple times for the same complaint.  She reports that the onset of her sore throat was Saturday morning.  She was seen in urgent care at that time at which time she was given a dose of Rocephin and told to return to be reevaluated if her symptoms worsened or if any new, concerning symptoms arise.  She states that throughout the day she had waxing and waning fevers and as result was reevaluated at which time Keflex was initiated and patient was given a steroid taper.  They reportedly obtained a strep swab which was negative.  Patient symptoms have continued to worsen despite antibiotic therapy and steroid therapy.  There are pain in her throat is worsened by swallowing.  She is tried Percocet without significant improvement to her pain.  States that the steroids have not improved her pain.  In addition to throat pain, patient also endorses right-sided neck pain and states that today she has noticed a change in her voice.  She endorses subjective fevers and intermittent chills.  Remaining review of systems negative at this time.  Past Medical History:  Diagnosis Date  . ADHD   . Anemia   . Arthritis   . Biliary acute pancreatitis 2010  . Colon cancer ([place]) 2016   Transverse colon   . Diabetes mellitus without complication ([place])   . Dyspnea   . Hypertension   . Migraine     Patient Active Problem List   Diagnosis Date Noted  . Menorrhagia with regular cycle 09/10/2018  . Prediabetes 07/09/2018  . Class 2 severe obesity due to excess  calories with serious comorbidity and body mass index (BMI) of 37.0 to 37.9 in adult ([place]) 07/09/2018  . Essential hypertension, benign 07/09/2018  . Mixed hyperlipidemia 07/09/2018  . H/O colon cancer, stage II 05/07/2018  . Change in bowel function 10/16/2017  . Hemorrhoid prolapse 10/16/2017  . Rectal bleeding 10/16/2017  . Colon cancer ([place]) 12/25/2014    Past Surgical History:  Procedure Laterality Date  . BAND HEMORRHOIDECTOMY    . CHOLECYSTECTOMY  2010   [place], biliary pancreatitis  . COLECTOMY  09/30/2015   distal transverse d/t adenocarcinoma  . COLONOSCOPY N/A 06/11/2018   Procedure: COLONOSCOPY;  Surgeon: Daneil Dolin, MD;  Location[facility];  Service: Endoscopy;  Laterality: N/A;  1:45pm  . DILITATION & CURRETTAGE/HYSTROSCOPY WITH NOVASURE ABLATION N/A 09/10/2018   Procedure: DILATATION & CURETTAGE/HYSTEROSCOPY WITH MINERVA  ENDOMETRIAL ABLATION;  Surgeon: Jonnie Kind, MD;  Location: [facility] ORS;  Service: Gynecology;  Laterality: N/A;  . POLYPECTOMY  06/11/2018   Procedure: POLYPECTOMY;  Surgeon: Daneil Dolin, MD;  Location: [facility];  Service: Endoscopy;;  colon     OB History    Gravida  0   Para  0   Term  0   Preterm  0   AB  0   Living  0     SAB  0   TAB  0   Ectopic  0   Multiple  0   Live Births  0            Home Medications    Prior to Admission medications   Medication Sig Start Date End Date Taking? Authorizing Provider  albuterol (PROAIR HFA) 108 (90 Base) MCG/ACT inhaler Inhale 2 puffs into the lungs every 4 (four) hours as needed for wheezing or shortness of breath.  10/17/17   [provider]  amLODipine (NORVASC) 5 MG tablet TAKE 1 TABLET BY MOUTH EVERY DAY Patient taking differently: Take 5 mg by mouth every morning.  05/10/18   Herminio Commons, MD  amphetamine-dextroamphetamine (ADDERALL XR) 20 MG 24 hr capsule Take 20 mg by mouth every morning.  05/03/18   [provider]  cephALEXin  (KEFLEX) 500 MG capsule Take 500 mg by mouth 4 (four) times daily. 10/19/18   [provider]  fluticasone furoate-vilanterol (BREO ELLIPTA) 100-25 MCG/INH AEPB Inhale 1 puff into the lungs every morning.     [provider]  losartan-hydrochlorothiazide (HYZAAR) 50-12.5 MG tablet Take 1 tablet by mouth every morning.     [provider]  metFORMIN (GLUCOPHAGE) 500 MG tablet TAKE 1 TABLET (500 MG TOTAL) BY MOUTH 2 (TWO) TIMES DAILY WITH A MEAL. 10/04/18   Cassandria Anger, MD  metoprolol succinate (TOPROL-XL) 50 MG 24 hr tablet Take 50 mg by mouth every morning.  09/19/17   [provider]  nitroGLYCERIN (NITROSTAT) 0.4 MG SL tablet Place 0.4 mg under the tongue every 5 (five) minutes as needed.  08/21/17   [provider]  OVER THE COUNTER MEDICATION Apply 1 application topically daily as needed (for pain hemmorrhoids). [facility] OTC     [provider]  oxyCODONE-acetaminophen (PERCOCET/ROXICET) 5-325 MG tablet Take 1 tablet by mouth every 4 (four) hours as needed for severe pain. 10/19/18   Nat Christen, MD  predniSONE (DELTASONE) 50 MG tablet 3 tabs for 3 days; 2 tabs for 3 days; 1 tab for 3 days 10/19/18   Nat Christen, MD  topiramate (TOPAMAX) 50 MG tablet Take 50 mg by mouth at bedtime.  09/19/17   [provider]  tranexamic acid (LYSTEDA) 650 MG TABS tablet Take 1,300 mg by mouth See admin instructions. Take 2 tablet (1300 mg) by mouth 3 times daily for 5 days during [facility] 07/13/18   [provider]    Family History Family History  Problem Relation Age of Onset  . Hypertension Mother   . Diabetes Mellitus II Mother   . Heart disease Sister        dx in late 80's  . Breast cancer Paternal Grandmother   . Breast cancer Paternal Aunt   . Colon cancer Neg Hx     Social History Social History   Tobacco Use  . Smoking status: Never Smoker  . Smokeless tobacco: Never Used  Substance Use Topics  . Alcohol  use: Yes    Comment: rare  . Drug use: No     Allergies   Lisinopril   Review of Systems Review of Systems  Constitutional: Negative for chills and fever.  HENT: Positive for sore throat, trouble swallowing and voice change. Negative for congestion.   Eyes: Negative for pain and visual disturbance.  Respiratory: Negative for cough and shortness of breath.   Cardiovascular: Negative for chest pain and palpitations.  Gastrointestinal: Negative for abdominal pain and vomiting.  Genitourinary: Negative for dysuria and hematuria.  Musculoskeletal: Negative for arthralgias and back pain.  Skin: Negative for color change  and rash.  Neurological: Negative for seizures and syncope.  Psychiatric/Behavioral: Negative for agitation, behavioral problems and confusion.  All other systems reviewed and are negative.    Physical Exam Updated Vital Signs BP (!) 111/99 (BP Location: Right Arm)   Pulse (!) 55   Temp 98.5 F (36.9 C) (Oral)   Resp 18   SpO2 100%   Physical Exam  Constitutional: She appears well-developed and well-nourished. No distress.  HENT:  Head: Normocephalic and atraumatic.  Mouth/Throat: Uvula is midline. Oropharyngeal exudate and posterior oropharyngeal erythema present. Tonsillar exudate.  Patient with exudate present on left tonsil.  Complains of pain more on the right side.  Generalized edema present but uvula is midline.  No discrete area of swelling present on exam.  Patient does have slight muffling of her voice during interview but is in no acute respiratory distress.  Eyes: Conjunctivae are normal.  Neck: Neck supple.  Cardiovascular: Normal rate and regular rhythm.  Pulmonary/Chest: Effort normal and breath sounds normal. No respiratory distress.  Abdominal: Soft. There is no tenderness.  Musculoskeletal: She exhibits no edema.  Neurological: She is alert.  Skin: Skin is warm and dry.  Psychiatric: She has a normal mood and affect.  Nursing note and  vitals reviewed.    ED Treatments / Results  Labs (all labs ordered are listed, but only abnormal results are displayed) Labs Reviewed  BASIC METABOLIC PANEL - Abnormal; Notable for the following components:      Result Value   Potassium 3.1 (*)    Calcium 8.8 (*)    All other components within normal limits  CBC WITH DIFFERENTIAL/PLATELET - Abnormal; Notable for the following components:   Platelets 144 (*)    All other components within normal limits    EKG None  Radiology No results found.  Procedures Procedures (including critical care time)  Medications Ordered in ED Medications - No data to display   Initial Impression / Assessment and Plan / ED Course  I have reviewed the triage vital signs and the nursing notes.  Pertinent labs & imaging results that were available during my care of the patient were reviewed by me and considered in my medical decision making (see chart for details).     Patient is a 47 year old female with a past medical history as detailed above who presents to the emergency department for evaluation of sore throat.  This is patient's third visit for the same complaint over the last few days.  She has been given antibiotics and steroids at previous visits without improvement to her symptoms.  States that she became concerned this morning when she noticed a change in her voice.  Patient's physical exam is as detailed above.  She does have some muffling of her voice.  As a result I planned to obtain a CT of the patient's neck with contrast to evaluate for possible peritonsillar abscess or other deep space infection.  Unfortunately, prior to obtaining the study the patient eloped.  She walked out without informing her nurse or myself of her intention to leave.  As a result I was not able to discuss the need for further evaluation of her sore throat prior to her leaving [place].  During our initial interview I did inform her of the plan to  obtain further imaging, but despite knowing this patient still chose to leave the emergency department without CT being performed.  Per the phlebotomist who saw the patient leave, patient was ambulating without difficulty and  in no acute distress at time of leaving [place].  The care of this patient was discussed with my attending physician Dr. Billy Fischer, who voices agreement with work-up and ED disposition.  Final Clinical Impressions(s) / ED Diagnoses   Final diagnoses:  Sore throat    ED Discharge Orders    None       Windell Musson, Chanda Busing, MD 10/22/18 1355    Gareth Morgan, MD 10/23/18 2206

## 2018-11-14 ENCOUNTER — Encounter: Payer: Self-pay | Admitting: "Endocrinology

## 2018-11-14 ENCOUNTER — Ambulatory Visit (INDEPENDENT_AMBULATORY_CARE_PROVIDER_SITE_OTHER): Payer: Managed Care, Other (non HMO) | Admitting: "Endocrinology

## 2018-11-14 VITALS — BP 139/80 | HR 90 | Ht 71.0 in | Wt 263.0 lb

## 2018-11-14 DIAGNOSIS — R7303 Prediabetes: Secondary | ICD-10-CM

## 2018-11-14 DIAGNOSIS — E782 Mixed hyperlipidemia: Secondary | ICD-10-CM

## 2018-11-14 DIAGNOSIS — I10 Essential (primary) hypertension: Secondary | ICD-10-CM

## 2018-11-14 DIAGNOSIS — Z6837 Body mass index (BMI) 37.0-37.9, adult: Secondary | ICD-10-CM

## 2018-11-14 MED ORDER — CALCIUM CARBONATE 1250 (500 CA) MG PO TABS: 1.0000 | ORAL_TABLET | Freq: Every day | ORAL | 6 refills | Status: DC

## 2018-11-14 MED ORDER — METFORMIN HCL 500 MG PO TABS: 500.0000 mg | ORAL_TABLET | Freq: Two times a day (BID) | ORAL | 1 refills | Status: DC

## 2018-11-14 NOTE — Progress Notes (Signed)
Endocrinology follow-up  Note       11/14/2018, 5:27 PM   Subjective:    Patient ID: Shelly Larson, female    DOB: 27-Oct-1971.  Shelly Larson is being seen in follow-up for prediabetes.  Past Medical History:  Diagnosis Date  . ADHD   . Anemia   . Arthritis   . Biliary acute pancreatitis 2010  . Colon cancer ([place]) 2016   Transverse colon   . Diabetes mellitus without complication ([place])   . Dyspnea   . Hypertension   . Migraine    Past Surgical History:  Procedure Laterality Date  . BAND HEMORRHOIDECTOMY    . CHOLECYSTECTOMY  2010   [place], biliary pancreatitis  . COLECTOMY  09/30/2015   distal transverse d/t adenocarcinoma  . COLONOSCOPY N/A 06/11/2018   Procedure: COLONOSCOPY;  Surgeon: Daneil Dolin, MD;  [facility];  Service: Endoscopy;  Laterality: N/A;  1:45pm  . DILITATION & CURRETTAGE/HYSTROSCOPY WITH NOVASURE ABLATION N/A 09/10/2018   Procedure: DILATATION & CURETTAGE/HYSTEROSCOPY WITH MINERVA  ENDOMETRIAL ABLATION;  Surgeon: Jonnie Kind, MD;  Location: [facility] ORS;  Service: Gynecology;  Laterality: N/A;  . POLYPECTOMY  06/11/2018   Procedure: POLYPECTOMY;  Surgeon: Daneil Dolin, MD;  Location: [facility];  Service: Endoscopy;;  colon   Social History   Socioeconomic History  . Marital status: Single    Spouse name: Not on file  . Number of children: Not on file  . Years of education: Not on file  . Highest education level: Not on file  Occupational History  . Occupation: Public affairs consultant  Social Needs  . Financial resource strain: Not on file  . Food insecurity:    Worry: Not on file    Inability: Not on file  . Transportation needs:    Medical: Not on file    Non-medical: Not on file  Tobacco Use  . Smoking status: Never Smoker  . Smokeless tobacco: Never Used  Substance and Sexual Activity  . Alcohol use: Yes    Comment: rare  . Drug use: No   . Sexual activity: Yes    Birth control/protection: None  Lifestyle  . Physical activity:    Days per week: Not on file    Minutes per session: Not on file  . Stress: Not on file  Relationships  . Social connections:    Talks on phone: Not on file    Gets together: Not on file    Attends religious service: Not on file    Active member of club or organization: Not on file    Attends meetings of clubs or organizations: Not on file    Relationship status: Not on file  Other Topics Concern  . Not on file  Social History Narrative  . Not on file   Outpatient Encounter Medications as of 11/14/2018  Medication Sig  . cyclobenzaprine (FLEXERIL) 10 MG tablet Take 10 mg by mouth at bedtime.  Marland Kitchen albuterol (PROAIR HFA) 108 (90 Base) MCG/ACT inhaler Inhale 2 puffs into the lungs every 4 (four) hours as needed for wheezing or shortness of breath.   Marland Kitchen  amLODipine (NORVASC) 5 MG tablet TAKE 1 TABLET BY MOUTH EVERY DAY (Patient taking differently: Take 5 mg by mouth every morning. )  . amphetamine-dextroamphetamine (ADDERALL XR) 20 MG 24 hr capsule Take 20 mg by mouth every morning.   . calcium carbonate (OS-CAL - DOSED IN MG OF ELEMENTAL CALCIUM) 1250 (500 Ca) MG tablet Take 1 tablet (500 mg of elemental calcium total) by mouth daily with breakfast.  . cephALEXin (KEFLEX) 500 MG capsule Take 500 mg by mouth 4 (four) times daily.  . fluticasone furoate-vilanterol (BREO ELLIPTA) 100-25 MCG/INH AEPB Inhale 1 puff into the lungs every morning.   Marland Kitchen losartan-hydrochlorothiazide (HYZAAR) 50-12.5 MG tablet Take 1 tablet by mouth every morning.   . metFORMIN (GLUCOPHAGE) 500 MG tablet Take 1 tablet (500 mg total) by mouth 2 (two) times daily with a meal.  . metoprolol succinate (TOPROL-XL) 50 MG 24 hr tablet Take 50 mg by mouth every morning.   . nitroGLYCERIN (NITROSTAT) 0.4 MG SL tablet Place 0.4 mg under the tongue every 5 (five) minutes as needed.   Marland Kitchen OVER THE COUNTER MEDICATION Apply 1 application  topically daily as needed (for pain hemmorrhoids). [facility] OTC   . oxyCODONE-acetaminophen (PERCOCET/ROXICET) 5-325 MG tablet Take 1 tablet by mouth every 4 (four) hours as needed for severe pain.  . predniSONE (DELTASONE) 50 MG tablet 3 tabs for 3 days; 2 tabs for 3 days; 1 tab for 3 days  . topiramate (TOPAMAX) 50 MG tablet Take 50 mg by mouth at bedtime.   . tranexamic acid (LYSTEDA) 650 MG TABS tablet Take 1,300 mg by mouth See admin instructions. Take 2 tablet (1300 mg) by mouth 3 times daily for 5 days during menstration  . [DISCONTINUED] metFORMIN (GLUCOPHAGE) 500 MG tablet TAKE 1 TABLET (500 MG TOTAL) BY MOUTH 2 (TWO) TIMES DAILY WITH A MEAL.   No facility-administered encounter medications on file as of 11/14/2018.     ALLERGIES: Allergies  Allergen Reactions  . Lisinopril Cough    VACCINATION STATUS:  There is no immunization history on file for this patient.  Other  This is a new (She was diagnosed with prediabetes with A1c of 6.1% on May 03, 2018.) problem. The current episode started more than 1 month ago. The problem has been unchanged. Pertinent negatives include no arthralgias, chest pain, chills, coughing, fatigue, fever, headaches, myalgias, nausea, rash or vomiting. Treatments tried: Metformin 500 mg p.o. twice daily.  Hypertension  This is a chronic problem. The current episode started more than 1 year ago. Pertinent negatives include no chest pain, headaches, palpitations or shortness of breath. Risk factors for coronary artery disease include obesity and sedentary lifestyle. Past treatments include angiotensin blockers, diuretics and calcium channel blockers.    She denies any prior history of pregnancy nor gestational diabetes. She has lost 7 more pounds total of 15 pounds since May 2019.  She continues to complain of fatigue.  She would like to achieve more weight loss.  She has struggled with weight control for which she tried various medications including  phentermine, and recently on Topamax .  She has been overweight or obese most of her adult life.  Reports body weight of 150 to 160 pounds at the end of her high school. - She is a survivor of colon cancer status post resection in September 2016.  Review of Systems  Constitutional: Negative for chills, fatigue, fever and unexpected weight change.  HENT: Negative for trouble swallowing and voice change.   Eyes: Negative for visual  disturbance.  Respiratory: Negative for cough, shortness of breath and wheezing.   Cardiovascular: Negative for chest pain, palpitations and leg swelling.  Gastrointestinal: Negative for diarrhea, nausea and vomiting.  Endocrine: Negative for cold intolerance, heat intolerance, polydipsia, polyphagia and polyuria.  Musculoskeletal: Negative for arthralgias and myalgias.  Skin: Negative for color change, pallor, rash and wound.  Neurological: Negative for seizures and headaches.  Psychiatric/Behavioral: Negative for confusion and suicidal ideas.    Objective:    BP 139/80   Pulse 90   Ht 5\' 11"  (1.803 m)   Wt 263 lb (119.3 kg)   BMI 36.68 kg/m   Wt Readings from Last 3 Encounters:  11/14/18 263 lb (119.3 kg)  10/19/18 246 lb (111.6 kg)  10/15/18 268 lb (121.6 kg)     Physical Exam  Constitutional: She is oriented to person, place, and time. She appears well-developed.  HENT:  Head: Normocephalic and atraumatic.  Eyes: EOM are normal.  Neck: Normal range of motion. Neck supple. No tracheal deviation present. No thyromegaly present.  Cardiovascular: Normal rate and regular rhythm.  Pulmonary/Chest: Effort normal and breath sounds normal.  Abdominal: Soft. Bowel sounds are normal. There is no tenderness. There is no guarding.  Musculoskeletal: Normal range of motion. She exhibits no edema.  Neurological: She is alert and oriented to person, place, and time. She has normal reflexes. No cranial nerve deficit. Coordination normal.  Skin: Skin is warm and  dry. No rash noted. No erythema. No pallor.  Significant acanthosis nigricans.  Psychiatric: She has a normal mood and affect. Judgment normal.    CMP     Component Value Date/Time   NA 139 10/21/2018 1838   K 3.1 (L) 10/21/2018 1838   CL 104 10/21/2018 1838   CO2 26 10/21/2018 1838   GLUCOSE 88 10/21/2018 1838   BUN 11 10/21/2018 1838   BUN 9 05/03/2018   CREATININE 0.73 10/21/2018 1838   CALCIUM 8.8 (L) 10/21/2018 1838   PROT 7.1 09/04/2018 1600   ALBUMIN 3.8 09/04/2018 1600   AST 15 09/04/2018 1600   ALT 24 09/04/2018 1600   ALKPHOS 56 09/04/2018 1600   BILITOT 0.4 09/04/2018 1600   GFRNONAA >60 10/21/2018 1838   GFRAA >60 10/21/2018 1838     Diabetic Labs (most recent): Lab Results  Component Value Date   HGBA1C 6.2 (H) 09/04/2018   HGBA1C 6.1 05/03/2018     Lipid Panel ( most recent) Lipid Panel     Component Value Date/Time   CHOL 134 05/03/2018   TRIG 141 05/03/2018   HDL 39 05/03/2018   LDLCALC 67 05/03/2018      Lab Results  Component Value Date   TSH 1.09 05/03/2018      Assessment & Plan:   1. Prediabetes -She has clinical findings consistent with insulin resistance including prominent acanthosis nigricans.  She has benefited from metformin treatment.  Her A1c remains stable at 6.2%. -She does not report any complications, however Shelly Larson remains at a high risk for more acute and chronic complications which include CAD, CVA, CKD, retinopathy, and neuropathy. These are all discussed in detail with the patient.  - I have counseled her on diet management and weight loss, by adopting a carbohydrate restricted/protein rich diet.  - I encouraged her to switch to  unprocessed or minimally processed complex starch and increased protein intake (animal or plant source), fruits, and vegetables.  - she is advised to stick to a routine mealtimes to eat 3 meals  a day and avoid unnecessary snacks ( to snack only to correct hypoglycemia).  -She will  continue to benefit from metformin treatment.  She is advised to continue metformin 500 mg p.o. twice daily-daily after breakfast and after supper.  For her related  weight problem with BMI of 37.6 :  -  Suggestion is made for her to avoid simple carbohydrates  from her diet including Cakes, Sweet Desserts / Pastries, Ice Cream, Soda (diet and regular), Sweet Tea, Candies, Chips, Cookies, Store Bought Juices, Alcohol in Excess of  1-2 drinks a day, Artificial Sweeteners, and "Sugar-free" Products. This will help patient to have stable blood glucose profile and potentially avoid unintended weight gain.   - Patient specific target  A1c;  LDL, HDL, Triglycerides, and  Waist Circumference were discussed in detail.  2) BP/HTN: Her blood pressure is controlled to target.  She is advised to continue her current medications including amlodipine 5 mg p.o. daily, losartan/HCTZ 25-12.5 mg daily, metoprolol 50 mg p.o. daily.   -Her medication dose will be adjusted to proper doses next visit if her blood pressure remains uncontrolled.   3) Lipids/HPL:   Her recent lipid panel is consistent with good control with LDL of 67.  She is not on statin medications.  4) hypocalcemia-mild chronic -Would benefit from calcium supplement.  I discussed and initiated calcium carbonate 1250 mg to give 500 mg of elemental calcium daily with breakfast.  5)  Weight/Diet: See above, CDE Consult will be initiated , exercise, and detailed carbohydrates information provided. -If her weight loss progress is not satisfactory, she will be considered for 1 of the recently approved weight loss medications if her insurance provides coverage.  She is also a candidate for bariatric surgery as  the third and last option.  6) Chronic Care/Health Maintenance:  -she is advised to stay away from smoking ; moderate intensity exercise for up to 150 minutes weekly; and  sleep for at least 7 hours a day.  Given her history of snoring which  possibly indicates sleep apnea, she would benefit the most from weight loss.  - I advised patient to maintain close follow up with Allwardt, Alyssa, PA for primary care needs.  - Time spent with the patient: 25 min, of which >50% was spent in reviewing her  current and  previous labs, previous treatments, and medications doses and developing a plan for long-term care.  Shelly Larson participated in the discussions, expressed understanding, and voiced agreement with the above plans.  All questions were answered to her satisfaction. she is encouraged to contact clinic should she have any questions or concerns prior to her return visit.   Follow up plan: - Return in about 6 months (around 05/15/2019) for Follow up with Pre-visit Labs.  Glade Lloyd, MD [facility] [address] [place], [place] [address] Phone: [phone]  Fax: [phone]    11/14/2018, 5:27 PM  This note was partially dictated with voice recognition software. Similar sounding words can be transcribed inadequately or may not  be corrected upon review.

## 2018-11-14 NOTE — Patient Instructions (Signed)

## 2018-11-15 ENCOUNTER — Encounter: Payer: Managed Care, Other (non HMO) | Admitting: Internal Medicine

## 2018-11-18 ENCOUNTER — Ambulatory Visit (INDEPENDENT_AMBULATORY_CARE_PROVIDER_SITE_OTHER): Payer: Managed Care, Other (non HMO) | Admitting: Otolaryngology

## 2019-01-07 ENCOUNTER — Encounter: Payer: Managed Care, Other (non HMO) | Admitting: Internal Medicine

## 2019-01-07 ENCOUNTER — Encounter: Payer: Self-pay | Admitting: Internal Medicine

## 2019-01-07 ENCOUNTER — Telehealth: Payer: Self-pay | Admitting: Internal Medicine

## 2019-01-07 NOTE — Telephone Encounter (Signed)
Patient was a no show and letter sent  °

## 2019-02-27 ENCOUNTER — Ambulatory Visit (INDEPENDENT_AMBULATORY_CARE_PROVIDER_SITE_OTHER): Payer: Managed Care, Other (non HMO) | Admitting: Obstetrics and Gynecology

## 2019-02-27 ENCOUNTER — Encounter: Payer: Self-pay | Admitting: Obstetrics and Gynecology

## 2019-02-27 VITALS — BP 153/103 | Ht 71.0 in | Wt 269.2 lb

## 2019-02-27 DIAGNOSIS — N951 Menopausal and female climacteric states: Secondary | ICD-10-CM

## 2019-02-27 NOTE — Progress Notes (Signed)
Patient ID: Shelly Larson, female   DOB: 1971/09/21, 48 y.o.   MRN: [id number]    [facility] Visit  @DATE @            Patient name: Shelly Larson MRN [id number]  Date of birth: [birth year]  CC & HPI:  DONYAE Larson is a 48 y.o. female presenting today for migraines, nausea, and hot flashes/night sweats. These symptoms started before her breast reduction on 02/12/2019. She will be returning to work on 03/11/2019.  She is currently taking Norvasc 5 mg but did not take it this morning. She monitors her BPs regularly at home. She stopped taking Adderral to see if it helped her symptoms but they became worse, so she started taking it again. Pt also reports no sexual response and no periods.  Pt is pleased with the results of her breast reduction and reports that the hardest part is sleeping on her back. She is 1 yr s/p endometrial ablation. The patient denies fever and any other symptoms or complaints at this time.   ROS:  ROS  + migraines + hot flashes/night sweats + nausea - fever All systems are negative except as noted in the HPI and PMH.   Pertinent History Reviewed:   Reviewed: Significant for endometrial ablation, breast reduction Medical         Past Medical History:  Diagnosis Date  . ADHD   . Anemia   . Arthritis   . Biliary acute pancreatitis 2010  . 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 Diabetes mellitus without complication ([place])   . Dyspnea   . Hypertension   . Migraine                               Surgical Hx:    Past Surgical History:  Procedure Laterality Date  . BAND HEMORRHOIDECTOMY    . CHOLECYSTECTOMY  2010   [place], biliary pancreatitis  . COLECTOMY  09/30/2015   distal transverse d/t adenocarcinoma  . COLONOSCOPY N/A 06/11/2018   Procedure: COLONOSCOPY;  Surgeon: Daneil Dolin, MD;  [facility];  Service: Endoscopy;  Laterality: N/A;  1:45pm  . DILITATION & CURRETTAGE/HYSTROSCOPY WITH NOVASURE ABLATION N/A 09/10/2018    Procedure: DILATATION & CURETTAGE/HYSTEROSCOPY WITH MINERVA  ENDOMETRIAL ABLATION;  Surgeon: Jonnie Kind, MD;  Location: [facility];  Service: Gynecology;  Laterality: N/A;  . POLYPECTOMY  06/11/2018   Procedure: POLYPECTOMY;  Surgeon: Daneil Dolin, MD;  [facility];  Service: Endoscopy;;  colon   Medications: Reviewed & Updated - see associated section                       Current Outpatient Medications:  .  amLODipine (NORVASC) 5 MG tablet, TAKE 1 TABLET BY MOUTH EVERY DAY (Patient taking differently: Take 5 mg by mouth every morning. ), Disp: 30 tablet, Rfl: 2 .  amphetamine-dextroamphetamine (ADDERALL XR) 20 MG 24 hr capsule, Take 30 mg by mouth every morning. , Disp: , Rfl:  .  calcium carbonate (OS-CAL - DOSED IN MG OF ELEMENTAL CALCIUM) 1250 (500 Ca) MG tablet, Take 1 tablet (500 mg of elemental calcium total) by mouth daily with breakfast., Disp: 30 tablet, Rfl: 6 .  cephALEXin (KEFLEX) 500 MG capsule, Take 500 mg by mouth 4 (four) times daily., Disp: , Rfl:  .  cyclobenzaprine (FLEXERIL) 10 MG tablet, Take 10  mg by mouth at bedtime., Disp: , Rfl:  .  fluticasone furoate-vilanterol (BREO ELLIPTA) 100-25 MCG/INH AEPB, Inhale 1 puff into the lungs every morning. , Disp: , Rfl:  .  losartan-hydrochlorothiazide (HYZAAR) 50-12.5 MG tablet, Take 1 tablet by mouth every morning. , Disp: , Rfl:  .  metFORMIN (GLUCOPHAGE) 500 MG tablet, Take 1 tablet (500 mg total) by mouth 2 (two) times daily with a meal., Disp: 180 tablet, Rfl: 1 .  metoprolol succinate (TOPROL-XL) 50 MG 24 hr tablet, Take 50 mg by mouth every morning. , Disp: , Rfl: 0 .  topiramate (TOPAMAX) 50 MG tablet, Take 50 mg by mouth at bedtime. , Disp: , Rfl: 0 .  albuterol (PROAIR HFA) 108 (90 Base) MCG/ACT inhaler, Inhale 2 puffs into the lungs every 4 (four) hours as needed for wheezing or shortness of breath. , Disp: , Rfl:  .  nitroGLYCERIN (NITROSTAT) 0.4 MG SL tablet, Place 0.4 mg under the tongue every 5 (five)  minutes as needed. , Disp: , Rfl:  .  OVER THE COUNTER MEDICATION, Apply 1 application topically daily as needed (for pain hemmorrhoids). rectacare OTC , Disp: , Rfl:  .  oxyCODONE-acetaminophen (PERCOCET/ROXICET) 5-325 MG tablet, Take 1 tablet by mouth every 4 (four) hours as needed for severe pain. (Patient not taking: Reported on 02/27/2019), Disp: 12 tablet, Rfl: 0 .  predniSONE (DELTASONE) 50 MG tablet, 3 tabs for 3 days; 2 tabs for 3 days; 1 tab for 3 days (Patient not taking: Reported on 02/27/2019), Disp: 18 tablet, Rfl: 0 .  tranexamic acid (LYSTEDA) 650 MG TABS tablet, Take 1,300 mg by mouth See admin instructions. Take 2 tablet (1300 mg) by mouth 3 times daily for 5 days during menstration, Disp: , Rfl: 3  Social History: Reviewed -  reports that she has never smoked. She has never used smokeless tobacco.  Objective Findings:  Vitals: Blood pressure (!) 153/103, height 5\' 11"  (1.803 m), weight 269 lb 3.2 oz (122.1 kg).  PHYSICAL EXAMINATION General appearance - alert, well appearing, and in no distress, oriented to person, place, and time and overweight Mental status - alert, oriented to person, place, and time, normal mood, behavior, speech, dress, motor activity, and thought processes, affect appropriate to mood  PELVIC DEFERRED  Discussion: 1. Discussed with pt weight management At end of discussion, pt had opportunity to ask questions and has no further questions at this time.  Specific discussion of weight management as noted above. Total time greater than: 5 minutes.   Assessment & Plan:   A:  1. Obesity Body mass index is 37.55 kg/m.  2. Perimenopausal, vasomotor symptoms  P:  1. Check FSH, TSH, estradiol 2. Consider Estrogen patch after blood work reviewed  By signing my name below, I, De Burrs, attest that this documentation has been prepared under the direction and in the presence of Jonnie Kind, MD. Electronically Signed: De Burrs, Medical Scribe.  02/27/19. 10:13 AM.  I personally performed the services described in this documentation, which was SCRIBED in my presence. The recorded information has been reviewed and considered accurate. It has been edited as necessary during review. Jonnie Kind, MD

## 2019-02-28 LAB — TSH: TSH: 1.36 u[IU]/mL (ref 0.450–4.500)

## 2019-02-28 LAB — ESTRADIOL: ESTRADIOL: 42.8 pg/mL

## 2019-02-28 LAB — FOLLICLE STIMULATING HORMONE: FSH: 9.9 m[IU]/mL

## 2019-03-04 ENCOUNTER — Encounter: Payer: Self-pay | Admitting: Obstetrics and Gynecology

## 2019-03-07 ENCOUNTER — Telehealth: Payer: Self-pay | Admitting: *Deleted

## 2019-03-07 ENCOUNTER — Telehealth: Payer: Self-pay | Admitting: Obstetrics and Gynecology

## 2019-03-07 NOTE — Telephone Encounter (Signed)
Patient called stating that Dr. Glo Herring has sent her to get some blood work done and he was going to call once he has received them but she has not heard from him. I let patient know that Dr. Glo Herring is not in the office today. Pt states that she would like a nurse to look and see if they could let her know. Please contact pt

## 2019-03-07 NOTE — Telephone Encounter (Signed)
Pt requesting lab results. DOB verified. Informed pt that thyroid labs were normal. Advised that other labs show that she is not yet in menopause. Pt verbalized understanding.

## 2019-05-15 ENCOUNTER — Ambulatory Visit: Payer: Managed Care, Other (non HMO) | Admitting: "Endocrinology

## 2019-06-04 ENCOUNTER — Ambulatory Visit: Payer: Managed Care, Other (non HMO) | Admitting: "Endocrinology

## 2019-07-28 ENCOUNTER — Telehealth: Payer: Self-pay | Admitting: Obstetrics and Gynecology

## 2019-07-28 NOTE — Telephone Encounter (Signed)
Pt states that every 18 days she gets bad cramps, headache, and bloating. She states with some bleeding. She states this weekend it was so bad she stayed in the bed.

## 2019-08-05 ENCOUNTER — Telehealth: Payer: Self-pay | Admitting: Obstetrics and Gynecology

## 2019-08-05 NOTE — Telephone Encounter (Signed)

## 2019-08-06 ENCOUNTER — Ambulatory Visit (INDEPENDENT_AMBULATORY_CARE_PROVIDER_SITE_OTHER): Payer: Managed Care, Other (non HMO) | Admitting: Obstetrics and Gynecology

## 2019-08-06 ENCOUNTER — Encounter: Payer: Self-pay | Admitting: Obstetrics and Gynecology

## 2019-08-06 ENCOUNTER — Other Ambulatory Visit: Payer: Self-pay

## 2019-08-06 VITALS — BP 169/109 | HR 84 | Ht 71.0 in | Wt 271.4 lb

## 2019-08-06 DIAGNOSIS — N939 Abnormal uterine and vaginal bleeding, unspecified: Secondary | ICD-10-CM | POA: Diagnosis not present

## 2019-08-06 DIAGNOSIS — Z9889 Other specified postprocedural states: Secondary | ICD-10-CM | POA: Diagnosis not present

## 2019-08-06 MED ORDER — MEDROXYPROGESTERONE ACETATE 10 MG PO TABS: 10.0000 mg | ORAL_TABLET | Freq: Every day | ORAL | 3 refills | Status: DC

## 2019-08-06 NOTE — Progress Notes (Signed)
Patient ID: Shelly Larson, female   DOB: 04-03-1971, 47 y.o.   MRN: [id number]    Shelly Larson Visit  @DATE @            Patient name: Shelly Larson MRN [id number]  Date of birth: February 15, 1971  CC & HPI:  Shelly Larson is a 48 y.o. female presenting today for discussion of cramping, headaches and bloating. Happens consistently every 18-20 days as if she is having a cycle. In may had some spotting. In early June had one day slight spotting, on 17th of June pain got worse, with bleeding only that day. July 2nd pain was wrentching twisting pain lasted for 3 days. July 15 lasted 3 days.  Aug 1st couldn't get out of bed, having headache and severe cramps.  ROS:  ROS +headache +cramps +bloating -blurry vision  All systems are negative except as noted in the HPI and PMH.    Pertinent History Reviewed:   Reviewed: Significant for ablation Medical         Past Medical History:  Diagnosis Date  . ADHD   . Anemia   . Arthritis   . Biliary acute pancreatitis 2010  . Colon cancer ([place]) 2016   Transverse colon   . Diabetes mellitus without complication ([place])   . Dyspnea   . Hypertension   . Migraine                               Surgical Hx:    Past Surgical History:  Procedure Laterality Date  . BAND HEMORRHOIDECTOMY    . CHOLECYSTECTOMY  2010   [place], biliary pancreatitis  . COLECTOMY  09/30/2015   distal transverse d/t adenocarcinoma  . COLONOSCOPY N/A 06/11/2018   Procedure: COLONOSCOPY;  Surgeon: Daneil Dolin, MD;  [facility];  Service: Endoscopy;  Laterality: N/A;  1:45pm  . DILITATION & CURRETTAGE/HYSTROSCOPY WITH NOVASURE ABLATION N/A 09/10/2018   Procedure: DILATATION & CURETTAGE/HYSTEROSCOPY WITH MINERVA  ENDOMETRIAL ABLATION;  Surgeon: Jonnie Kind, MD;  Location: [facility];  Service: Gynecology;  Laterality: N/A;  . POLYPECTOMY  06/11/2018   Procedure: POLYPECTOMY;  Surgeon: Daneil Dolin, MD;  [facility];  Service: Endoscopy;;   colon   Medications: Reviewed & Updated - see associated section                       Current Outpatient Medications:  .  albuterol (PROAIR HFA) 108 (90 Base) MCG/ACT inhaler, Inhale 2 puffs into the lungs every 4 (four) hours as needed for wheezing or shortness of breath. , Disp: , Rfl:  .  amLODipine (NORVASC) 5 MG tablet, TAKE 1 TABLET BY MOUTH EVERY DAY (Patient taking differently: Take 5 mg by mouth every morning. ), Disp: 30 tablet, Rfl: 2 .  amphetamine-dextroamphetamine (ADDERALL XR) 20 MG 24 hr capsule, Take 30 mg by mouth every morning. , Disp: , Rfl:  .  calcium carbonate (OS-CAL - DOSED IN MG OF ELEMENTAL CALCIUM) 1250 (500 Ca) MG tablet, Take 1 tablet (500 mg of elemental calcium total) by mouth daily with breakfast., Disp: 30 tablet, Rfl: 6 .  cephALEXin (KEFLEX) 500 MG capsule, Take 500 mg by mouth 4 (four) times daily., Disp: , Rfl:  .  cyclobenzaprine (FLEXERIL) 10 MG tablet, Take 10 mg by mouth at bedtime., Disp: , Rfl:  .  fluticasone furoate-vilanterol (BREO ELLIPTA) 100-25 MCG/INH AEPB, Inhale 1  puff into the lungs every morning. , Disp: , Rfl:  .  losartan-hydrochlorothiazide (HYZAAR) 50-12.5 MG tablet, Take 1 tablet by mouth every morning. , Disp: , Rfl:  .  metFORMIN (GLUCOPHAGE) 500 MG tablet, Take 1 tablet (500 mg total) by mouth 2 (two) times daily with a meal., Disp: 180 tablet, Rfl: 1 .  metoprolol succinate (TOPROL-XL) 50 MG 24 hr tablet, Take 50 mg by mouth every morning. , Disp: , Rfl: 0 .  nitroGLYCERIN (NITROSTAT) 0.4 MG SL tablet, Place 0.4 mg under the tongue every 5 (five) minutes as needed. , Disp: , Rfl:  .  OVER THE COUNTER MEDICATION, Apply 1 application topically daily as needed (for pain hemmorrhoids). rectacare OTC , Disp: , Rfl:  .  oxyCODONE-acetaminophen (PERCOCET/ROXICET) 5-325 MG tablet, Take 1 tablet by mouth every 4 (four) hours as needed for severe pain. (Patient not taking: Reported on 02/27/2019), Disp: 12 tablet, Rfl: 0 .  predniSONE (DELTASONE)  50 MG tablet, 3 tabs for 3 days; 2 tabs for 3 days; 1 tab for 3 days (Patient not taking: Reported on 02/27/2019), Disp: 18 tablet, Rfl: 0 .  topiramate (TOPAMAX) 50 MG tablet, Take 50 mg by mouth at bedtime. , Disp: , Rfl: 0 .  tranexamic acid (LYSTEDA) 650 MG TABS tablet, Take 1,300 mg by mouth See admin instructions. Take 2 tablet (1300 mg) by mouth 3 times daily for 5 days during menstration, Disp: , Rfl: 3   Social History: Reviewed -  reports that she has never smoked. She has never used smokeless tobacco.  Objective Findings:  Vitals: There were no vitals taken for this visit.  PHYSICAL EXAMINATION General appearance - alert, well appearing, and in no distress Mental status - alert, oriented to person, place, and time, normal mood, behavior, speech, dress, motor activity, and thought processes, affect appropriate to mood  PELVIC Discussion only  Assessment & Plan:   A:  1.  AUB, s/ endometrial ablation.  P:  1. Provera x 14 days 2. F/u in 1 month tele visit 3. Consider megace suppression for persistent AUB.    By signing my name below, I, Samul Dada, attest that this documentation has been prepared under the direction and in the presence of Jonnie Kind, MD. Electronically Signed: [place]. 08/06/19. 12:04 PM.  I personally performed the services described in this documentation, which was SCRIBED in my presence. The recorded information has been reviewed and considered accurate. It has been edited as necessary during review. Jonnie Kind, MD

## 2019-09-02 ENCOUNTER — Telehealth: Payer: Self-pay | Admitting: Obstetrics and Gynecology

## 2019-09-02 NOTE — Telephone Encounter (Signed)
Called patient regarding appointment and the following message was left:   We have you scheduled for an upcoming appointment at our office. At this time, patients are encouraged to come alone to their visits whenever possible, however, a support person, over age 46, may accompany you to your appointment if assistance is needed for safety or care concerns. Otherwise, support persons should remain outside until the visit is complete.   We ask if you have had any exposure to anyone suspected or confirmed of having COVID-19 or if you are experiencing any of the following, to call and reschedule your appointment: fever, cough, shortness of breath, muscle pain, diarrhea, rash, vomiting, abdominal pain, red eye, weakness, bruising, bleeding, joint pain, or a severe headache.   Please know we will ask you these questions or similar questions when you arrive for your appointment and again its how we are keeping everyone safe.    Also,to keep you safe, please use the provided hand sanitizer when you enter the office. We are asking everyone in the office to wear a mask to help prevent the spread of germs. If you have a mask of your own, please wear it to your appointment, if not, we are happy to provide one for you.  Thank you for understanding and your cooperation.    [facility] Staff

## 2019-09-03 ENCOUNTER — Ambulatory Visit: Payer: Managed Care, Other (non HMO) | Admitting: Obstetrics and Gynecology

## 2019-09-03 ENCOUNTER — Telehealth: Payer: Self-pay | Admitting: Obstetrics and Gynecology

## 2019-09-03 NOTE — Telephone Encounter (Signed)
Patient missed appointment today, phone call made to go over labs from earlier this year.  Message left for patient to call the office and give me a x1 week return the call thank you

## 2019-09-04 ENCOUNTER — Telehealth: Payer: Self-pay | Admitting: Obstetrics and Gynecology

## 2019-09-04 NOTE — Telephone Encounter (Signed)
Called patient regarding appointment scheduled in our office encouraged to come alone to the visit if possible, however, a support person, over age 46, may accompany her  to appointment if assistance is needed for safety or care concerns. Otherwise, support persons should remain outside until the visit is complete.  ° °We ask if you have had any exposure to anyone suspected or confirmed of having COVID-19 or if you are experiencing any of the following, to call and reschedule your appointment: fever, cough, shortness of breath, muscle pain, diarrhea, rash, vomiting, abdominal pain, red eye, weakness, bruising, bleeding, joint pain, or a severe headache.  ° °Please know we will ask you these questions or similar questions when you arrive for your appointment and again it’s how we are keeping everyone safe.   ° °Also,to keep you safe, please use the provided hand sanitizer when you enter the office. We are asking everyone in the office to wear a mask to help prevent the spread of °germs. If you have a mask of your own, please wear it to your appointment, if not, we are happy to provide one for you. ° °Thank you for understanding and your cooperation.  ° ° °[facility] Staff ° ° ° °

## 2019-09-05 ENCOUNTER — Telehealth (INDEPENDENT_AMBULATORY_CARE_PROVIDER_SITE_OTHER): Payer: Managed Care, Other (non HMO) | Admitting: Obstetrics and Gynecology

## 2019-09-05 ENCOUNTER — Other Ambulatory Visit: Payer: Self-pay

## 2019-09-05 ENCOUNTER — Encounter: Payer: Self-pay | Admitting: Obstetrics and Gynecology

## 2019-09-05 VITALS — Ht 71.0 in

## 2019-09-05 DIAGNOSIS — N939 Abnormal uterine and vaginal bleeding, unspecified: Secondary | ICD-10-CM | POA: Diagnosis not present

## 2019-09-05 MED ORDER — NORETHINDRONE ACETATE 5 MG PO TABS: 2.5000 mg | ORAL_TABLET | Freq: Every day | ORAL | 3 refills | Status: DC

## 2019-09-05 NOTE — Progress Notes (Addendum)
Patient ID: Shelly Larson, female   DOB: 21-Sep-1971, 48 y.o.   MRN: [id number]     [facility] VIRTUAL VIDEO VISIT ENCOUNTER NOTE  Provider location: [facility] [employer]   I connected with Shelly Larson on 09/05/2019 at 12:50 PM EDT by MyChart Video Encounter at home and verified that I am speaking with the correct person using two identifiers.   I discussed the limitations, risks, security and privacy concerns of performing an evaluation and management service virtually and the availability of in person appointments. I also discussed with the patient that there may be a patient responsible charge related to this service. The patient expressed understanding and agreed to proceed.   History:  Shelly Larson is a 48 y.o. G0P0000 female being evaluated today for gyn f/u. Pain calender and her last visit she was given Provera for 14 days and the plan was to see if it would cause a endometrial sloughing.  She had a small amount of light bleeding with some cramping and discomfort.  She had 2 days that she rested in bed due to the bleeding and discomfort. She denies any abnormal vaginal discharge, bleeding, pelvic pain or other concerns.       Past Medical History:  Diagnosis Date  . ADHD   . Anemia   . Arthritis   . Biliary acute pancreatitis 2010  . 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 Diabetes mellitus without complication ([place])   . Dyspnea   . Hypertension   . Migraine    Past Surgical History:  Procedure Laterality Date  . BAND HEMORRHOIDECTOMY    . CHOLECYSTECTOMY  2010   [place], biliary pancreatitis  . COLECTOMY  09/30/2015   distal transverse d/t adenocarcinoma  . COLONOSCOPY N/A 06/11/2018   Procedure: COLONOSCOPY;  Surgeon: Daneil Dolin, MD;  [facility];  Service: Endoscopy;  Laterality: N/A;  1:45pm  . DILITATION & CURRETTAGE/HYSTROSCOPY WITH NOVASURE ABLATION N/A 09/10/2018   Procedure: DILATATION &  CURETTAGE/HYSTEROSCOPY WITH MINERVA  ENDOMETRIAL ABLATION;  Surgeon: Jonnie Kind, MD;  Location: [facility];  Service: Gynecology;  Laterality: N/A;  . POLYPECTOMY  06/11/2018   Procedure: POLYPECTOMY;  Surgeon: Daneil Dolin, MD;  [facility];  Service: Endoscopy;;  colon   The following portions of the patient's history were reviewed and updated as appropriate: allergies, current medications, past family history, past medical history, past social history, past surgical history and problem list.   Review of Systems:  Pertinent items noted in HPI and remainder of comprehensive ROS otherwise negative.  We reviewed her [place] estradiol and TSH from March which show that she is premenopausal, has normal thyroid function test.  Physical Exam:   General:  Alert, oriented and cooperative. Patient appears to be in no acute distress.  Mental Status: Normal mood and affect. Normal behavior. Normal judgment and thought content.   Respiratory: Normal respiratory effort, no problems with respiration noted  Rest of physical exam deferred due to type of encounter  Labs and Imaging  No results found.     Assessment and Plan:    Recurrent bleeding after endometrial ablation Premenopausal status Normal thyroid function Plan: Normal thyroid function test and premenopausal FSH values reviewed Aygestin 5 mg 1/2 tablet p.o. daily Review bleeding in 90 days    The patient was advised to call back or seek an in-person evaluation/go to the ED if the symptoms worsen or if the condition  fails to improve as anticipated.  I provided 12 minutes of face-to-face time during this encounter.,  Plus a similar amount of time an documentation  By signing my name below, I, Samul Dada, attest that this documentation has been prepared under the direction and in the presence of Jonnie Kind, MD. Electronically Signed: [place]. 09/05/19. 1:07 PM.  I personally performed the  services described in this documentation, which was SCRIBED in my presence. The recorded information has been reviewed and considered accurate. It has been edited as necessary during review. Jonnie Kind, MD

## 2019-12-29 ENCOUNTER — Other Ambulatory Visit: Payer: Self-pay

## 2019-12-29 ENCOUNTER — Encounter (INDEPENDENT_AMBULATORY_CARE_PROVIDER_SITE_OTHER): Payer: Self-pay | Admitting: Otolaryngology

## 2019-12-29 ENCOUNTER — Ambulatory Visit (INDEPENDENT_AMBULATORY_CARE_PROVIDER_SITE_OTHER): Payer: Managed Care, Other (non HMO) | Admitting: Otolaryngology

## 2019-12-29 VITALS — Temp 97.9°F

## 2019-12-29 DIAGNOSIS — J310 Chronic rhinitis: Secondary | ICD-10-CM

## 2019-12-29 DIAGNOSIS — J3501 Chronic tonsillitis: Secondary | ICD-10-CM | POA: Diagnosis not present

## 2019-12-29 NOTE — Progress Notes (Signed)
HPI: Shelly Larson is a 49 y.o. female who presents for evaluation of chronic tonsil problems and sinuses.  She has always had problems with nasal congestion sinus infections.  She has also had history of tonsil infections and previous tonsillar abscess.  She was referred to Dr. Benjamine Mola but never met with him as she was feeling better by the time of her appointment came around.  She has mild nasal congestion today but no sore throat. She had an MRI scan of her head because of headaches in 2018 that showed moderate sinus disease with partial opacification of left frontal sinus.  This was not available for visualization but only by report.  Past Medical History:  Diagnosis Date  . ADHD   . Anemia   . Arthritis   . Biliary acute pancreatitis 2010  . 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 Diabetes mellitus without complication ([place])   . Dyspnea   . Hypertension   . Migraine    Past Surgical History:  Procedure Laterality Date  . BAND HEMORRHOIDECTOMY    . CHOLECYSTECTOMY  2010   [place], biliary pancreatitis  . COLECTOMY  09/30/2015   distal transverse d/t adenocarcinoma  . COLONOSCOPY N/A 06/11/2018   Procedure: COLONOSCOPY;  Surgeon: Daneil Dolin, MD;  [facility];  Service: Endoscopy;  Laterality: N/A;  1:45pm  . DILITATION & CURRETTAGE/HYSTROSCOPY WITH NOVASURE ABLATION N/A 09/10/2018   Procedure: DILATATION & CURETTAGE/HYSTEROSCOPY WITH MINERVA  ENDOMETRIAL ABLATION;  Surgeon: Jonnie Kind, MD;  Location: [facility];  Service: Gynecology;  Laterality: N/A;  . POLYPECTOMY  06/11/2018   Procedure: POLYPECTOMY;  Surgeon: Daneil Dolin, MD;  Location: [facility];  Service: Endoscopy;;  colon   Social History   Socioeconomic History  . Marital status: Single    Spouse name: Not on file  . Number of children: Not on file  . Years of education: Not on file  . Highest education level: Not on file  Occupational History  . Occupation: Public affairs consultant  Tobacco  Use  . Smoking status: Never Smoker  . Smokeless tobacco: Never Used  Substance and Sexual Activity  . Alcohol use: Yes    Comment: rare  . Drug use: No  . Sexual activity: Yes    Birth control/protection: None, Surgical    Comment: ablation  Other Topics Concern  . Not on file  Social History Narrative  . Not on file   Social Determinants of Health   Financial Resource Strain:   . Difficulty of Paying Living Expenses: Not on file  Food Insecurity:   . Worried About Charity fundraiser in the Last Year: Not on file  . Ran Out of Food in the Last Year: Not on file  Transportation Needs:   . Lack of Transportation (Medical): Not on file  . Lack of Transportation (Non-Medical): Not on file  Physical Activity:   . Days of Exercise per Week: Not on file  . Minutes of Exercise per Session: Not on file  Stress:   . Feeling of Stress : Not on file  Social Connections:   . Frequency of Communication with Friends and Family: Not on file  . Frequency of Social Gatherings with Friends and Family: Not on file  . Attends Religious Services: Not on file  . Active Member of Clubs or Organizations: Not on file  . Attends Archivist Meetings: Not on file  . Marital Status: Not on file  Family History  Problem Relation Age of Onset  . Hypertension Mother   . Diabetes Mellitus II Mother   . Heart disease Sister        dx in late 57's  . Breast cancer Paternal Grandmother   . Breast cancer Paternal Aunt   . Colon cancer Neg Hx    Allergies  Allergen Reactions  . Lisinopril Cough   Prior to Admission medications   Medication Sig Start Date End Date Taking? Authorizing Provider  albuterol (PROAIR HFA) 108 (90 Base) MCG/ACT inhaler Inhale 2 puffs into the lungs every 4 (four) hours as needed for wheezing or shortness of breath.  10/17/17  Yes [provider]  amLODipine (NORVASC) 5 MG tablet TAKE 1 TABLET BY MOUTH EVERY DAY Patient taking differently: Take 5 mg by  mouth every morning.  05/10/18  Yes Herminio Commons, MD  amphetamine-dextroamphetamine (ADDERALL XR) 20 MG 24 hr capsule Take 30 mg by mouth 2 (two) times daily.  05/03/18  Yes [provider]  calcium carbonate (OS-CAL - DOSED IN MG OF ELEMENTAL CALCIUM) 1250 (500 Ca) MG tablet Take 1 tablet (500 mg of elemental calcium total) by mouth daily with breakfast. 11/14/18  Yes Nida, Marella Chimes, MD  cyclobenzaprine (FLEXERIL) 10 MG tablet Take 10 mg by mouth at bedtime.   Yes [provider]  fluticasone furoate-vilanterol (BREO ELLIPTA) 100-25 MCG/INH AEPB Inhale 1 puff into the lungs every morning.    Yes [provider]  losartan-hydrochlorothiazide (HYZAAR) 50-12.5 MG tablet Take 1 tablet by mouth every morning.    Yes [provider]  metFORMIN (GLUCOPHAGE) 500 MG tablet Take 1 tablet (500 mg total) by mouth 2 (two) times daily with a meal. 11/14/18  Yes Nida, Marella Chimes, MD  metoprolol succinate (TOPROL-XL) 50 MG 24 hr tablet Take 50 mg by mouth every morning.  09/19/17  Yes [provider]  nitroGLYCERIN (NITROSTAT) 0.4 MG SL tablet Place 0.4 mg under the tongue every 5 (five) minutes as needed.  08/21/17  Yes [provider]  norethindrone (AYGESTIN) 5 MG tablet Take 0.5 tablets (2.5 mg total) by mouth daily. 09/05/19  Yes Jonnie Kind, MD  OVER THE COUNTER MEDICATION Apply 1 application topically daily as needed (for pain hemmorrhoids). rectacare OTC    Yes [provider]  topiramate (TOPAMAX) 50 MG tablet Take 50 mg by mouth at bedtime.  09/19/17  Yes [provider]     Positive ROS: Otherwise negative  All other systems have been reviewed and were otherwise negative with the exception of those mentioned in the HPI and as above.  Physical Exam: Constitutional: Alert, well-appearing, no acute distress Ears: External ears without lesions or tenderness. Ear canals are clear bilaterally with intact, clear TMs.   Nasal: External nose without lesions. Septum with mild deformity.  Moderate rhinitis worse on the right side. Clear nasal passages otherwise with no polyps and no mucopurulent discharge from the middle meatus which was clear bilaterally. Oral: Lips and gums without lesions. Tongue and palate mucosa without lesions. Posterior oropharynx clear.  Large cryptic tonsils bilaterally with no evidence of infection and no peritonsillar swelling. Neck: No palpable adenopathy or masses Respiratory: Breathing comfortably  Skin: No facial/neck lesions or rash noted.  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 Chronic rhinitis  Plan: Recommended initially treating this medically and suggested using Nasacort 2 sprays each nostril at night and prescribed at this.  She is using Flonase without much benefit.  Also gave her a prescription for Augmentin 875 mg twice daily for 2 weeks if she develops any sore throat or sinus infection. Briefly discussed with her concerning option of tonsillectomy if she continues to have recurrent tonsil problems as well as possible sinus surgery if sinuses are major issue in the future but would initially use nasal steroid spray on a regular basis.  If she continues to have sinus issues she will call us back to schedule CT scan of the sinuses if necessary.  Presently no evidence of active infection clinically.  Radene Journey, MD

## 2021-08-31 ENCOUNTER — Emergency Department (HOSPITAL_COMMUNITY): Payer: 59

## 2021-08-31 ENCOUNTER — Inpatient Hospital Stay (HOSPITAL_COMMUNITY)
Admission: EM | Admit: 2021-08-31 | Discharge: 2021-09-11 | DRG: 442 | Disposition: A | Discharge status: Home / Self Care | Payer: 59 | Attending: Internal Medicine | Admitting: Internal Medicine

## 2021-08-31 ENCOUNTER — Encounter (HOSPITAL_COMMUNITY): Payer: Self-pay

## 2021-08-31 ENCOUNTER — Other Ambulatory Visit: Payer: Self-pay

## 2021-08-31 DIAGNOSIS — R609 Edema, unspecified: Secondary | ICD-10-CM | POA: Diagnosis present

## 2021-08-31 DIAGNOSIS — R748 Abnormal levels of other serum enzymes: Secondary | ICD-10-CM | POA: Diagnosis not present

## 2021-08-31 DIAGNOSIS — K5900 Constipation, unspecified: Secondary | ICD-10-CM | POA: Diagnosis present

## 2021-08-31 DIAGNOSIS — N179 Acute kidney failure, unspecified: Secondary | ICD-10-CM

## 2021-08-31 DIAGNOSIS — Z86010 Personal history of colonic polyps: Secondary | ICD-10-CM

## 2021-08-31 DIAGNOSIS — B169 Acute hepatitis B without delta-agent and without hepatic coma: Principal | ICD-10-CM

## 2021-08-31 DIAGNOSIS — I10 Essential (primary) hypertension: Secondary | ICD-10-CM

## 2021-08-31 DIAGNOSIS — Z6836 Body mass index (BMI) 36.0-36.9, adult: Secondary | ICD-10-CM

## 2021-08-31 DIAGNOSIS — Z20822 Contact with and (suspected) exposure to covid-19: Secondary | ICD-10-CM | POA: Diagnosis present

## 2021-08-31 DIAGNOSIS — R1010 Upper abdominal pain, unspecified: Secondary | ICD-10-CM | POA: Diagnosis not present

## 2021-08-31 DIAGNOSIS — R81 Glycosuria: Secondary | ICD-10-CM | POA: Diagnosis present

## 2021-08-31 DIAGNOSIS — E669 Obesity, unspecified: Secondary | ICD-10-CM | POA: Diagnosis present

## 2021-08-31 DIAGNOSIS — K760 Fatty (change of) liver, not elsewhere classified: Secondary | ICD-10-CM | POA: Diagnosis present

## 2021-08-31 DIAGNOSIS — R109 Unspecified abdominal pain: Secondary | ICD-10-CM

## 2021-08-31 DIAGNOSIS — R110 Nausea: Secondary | ICD-10-CM

## 2021-08-31 DIAGNOSIS — M25511 Pain in right shoulder: Secondary | ICD-10-CM | POA: Diagnosis present

## 2021-08-31 DIAGNOSIS — E119 Type 2 diabetes mellitus without complications: Secondary | ICD-10-CM

## 2021-08-31 DIAGNOSIS — R894 Abnormal immunological findings in specimens from other organs, systems and tissues: Secondary | ICD-10-CM

## 2021-08-31 DIAGNOSIS — E782 Mixed hyperlipidemia: Secondary | ICD-10-CM | POA: Diagnosis present

## 2021-08-31 DIAGNOSIS — J45909 Unspecified asthma, uncomplicated: Secondary | ICD-10-CM

## 2021-08-31 DIAGNOSIS — J4520 Mild intermittent asthma, uncomplicated: Secondary | ICD-10-CM | POA: Diagnosis not present

## 2021-08-31 DIAGNOSIS — K769 Liver disease, unspecified: Secondary | ICD-10-CM

## 2021-08-31 DIAGNOSIS — K2970 Gastritis, unspecified, without bleeding: Secondary | ICD-10-CM | POA: Diagnosis present

## 2021-08-31 DIAGNOSIS — Z9049 Acquired absence of other specified parts of digestive tract: Secondary | ICD-10-CM

## 2021-08-31 DIAGNOSIS — R7989 Other specified abnormal findings of blood chemistry: Secondary | ICD-10-CM

## 2021-08-31 DIAGNOSIS — B0081 Herpesviral hepatitis: Secondary | ICD-10-CM | POA: Diagnosis present

## 2021-08-31 DIAGNOSIS — R945 Abnormal results of liver function studies: Secondary | ICD-10-CM

## 2021-08-31 DIAGNOSIS — F909 Attention-deficit hyperactivity disorder, unspecified type: Secondary | ICD-10-CM | POA: Diagnosis present

## 2021-08-31 DIAGNOSIS — Z888 Allergy status to other drugs, medicaments and biological substances status: Secondary | ICD-10-CM

## 2021-08-31 DIAGNOSIS — Z79899 Other long term (current) drug therapy: Secondary | ICD-10-CM

## 2021-08-31 DIAGNOSIS — R17 Unspecified jaundice: Secondary | ICD-10-CM

## 2021-08-31 DIAGNOSIS — Z85038 Personal history of other malignant neoplasm of large intestine: Secondary | ICD-10-CM

## 2021-08-31 DIAGNOSIS — Z833 Family history of diabetes mellitus: Secondary | ICD-10-CM

## 2021-08-31 DIAGNOSIS — Z8249 Family history of ischemic heart disease and other diseases of the circulatory system: Secondary | ICD-10-CM

## 2021-08-31 DIAGNOSIS — E876 Hypokalemia: Secondary | ICD-10-CM | POA: Diagnosis not present

## 2021-08-31 DIAGNOSIS — B179 Acute viral hepatitis, unspecified: Secondary | ICD-10-CM | POA: Diagnosis not present

## 2021-08-31 DIAGNOSIS — Z7984 Long term (current) use of oral hypoglycemic drugs: Secondary | ICD-10-CM

## 2021-08-31 DIAGNOSIS — Z23 Encounter for immunization: Secondary | ICD-10-CM

## 2021-08-31 LAB — LIPASE, BLOOD: Lipase: 27 U/L (ref 11–51)

## 2021-08-31 LAB — COMPREHENSIVE METABOLIC PANEL
ALT: 1476 U/L — ABNORMAL HIGH (ref 0–44)
AST: 1036 U/L — ABNORMAL HIGH (ref 15–41)
Albumin: 3.8 g/dL (ref 3.5–5.0)
Alkaline Phosphatase: 124 U/L (ref 38–126)
Anion gap: 8 (ref 5–15)
BUN: 15 mg/dL (ref 6–20)
CO2: 28 mmol/L (ref 22–32)
Calcium: 9.3 mg/dL (ref 8.9–10.3)
Chloride: 101 mmol/L (ref 98–111)
Creatinine, Ser: 1.04 mg/dL — ABNORMAL HIGH (ref 0.44–1.00)
GFR, Estimated: >60 mL/min (ref >60)
Glucose, Bld: 113 mg/dL — ABNORMAL HIGH (ref 70–99)
Potassium: 3.5 mmol/L (ref 3.5–5.1)
Sodium: 137 mmol/L (ref 135–145)
Total Bilirubin: 13 mg/dL — ABNORMAL HIGH (ref 0.3–1.2)
Total Protein: 7.5 g/dL (ref 6.5–8.1)

## 2021-08-31 LAB — CBC WITH DIFFERENTIAL/PLATELET
Abs Immature Granulocytes: 0.03 10*3/uL (ref 0.00–0.07)
Basophils Absolute: 0.1 10*3/uL (ref 0.0–0.1)
Basophils Relative: 1 %
Eosinophils Absolute: 0.2 10*3/uL (ref 0.0–0.5)
Eosinophils Relative: 3 %
HCT: 45 % (ref 36.0–46.0)
Hemoglobin: 14.7 g/dL (ref 12.0–15.0)
Immature Granulocytes: 1 %
Lymphocytes Relative: 19 %
Lymphs Abs: 1 10*3/uL (ref 0.7–4.0)
MCH: 28.4 pg (ref 26.0–34.0)
MCHC: 32.7 g/dL (ref 30.0–36.0)
MCV: 86.9 fL (ref 80.0–100.0)
Monocytes Absolute: 0.8 10*3/uL (ref 0.1–1.0)
Monocytes Relative: 16 %
Neutro Abs: 3 10*3/uL (ref 1.7–7.7)
Neutrophils Relative %: 60 %
Platelets: 160 10*3/uL (ref 150–400)
RBC: 5.18 MIL/uL — ABNORMAL HIGH (ref 3.87–5.11)
RDW: 19.3 % — ABNORMAL HIGH (ref 11.5–15.5)
WBC: 4.9 10*3/uL (ref 4.0–10.5)
nRBC: 0 % (ref 0.0–0.2)

## 2021-08-31 LAB — URINALYSIS, MICROSCOPIC (REFLEX): RBC / HPF: NONE SEEN RBC/hpf (ref 0–5)

## 2021-08-31 LAB — URINALYSIS, ROUTINE W REFLEX MICROSCOPIC
Glucose, UA: 100 mg/dL — AB
Hgb urine dipstick: NEGATIVE
Ketones, ur: NEGATIVE mg/dL
Nitrite: NEGATIVE
Protein, ur: 30 mg/dL — AB
Specific Gravity, Urine: >1.03 — ABNORMAL HIGH (ref 1.005–1.030)
pH: 5 (ref 5.0–8.0)

## 2021-08-31 LAB — SEDIMENTATION RATE: Sed Rate: 25 mm/hr — ABNORMAL HIGH (ref 0–22)

## 2021-08-31 LAB — PROTIME-INR
INR: 1 (ref 0.8–1.2)
Prothrombin Time: 13.5 seconds (ref 11.4–15.2)

## 2021-08-31 LAB — RESP PANEL BY RT-PCR (FLU A&B, COVID) ARPGX2
Influenza A by PCR: NEGATIVE
Influenza B by PCR: NEGATIVE
SARS Coronavirus 2 by RT PCR: NEGATIVE

## 2021-08-31 LAB — BILIRUBIN, FRACTIONATED(TOT/DIR/INDIR)
Bilirubin, Direct: 8.6 mg/dL — ABNORMAL HIGH (ref 0.0–0.2)
Indirect Bilirubin: 5.1 mg/dL — ABNORMAL HIGH (ref 0.3–0.9)
Total Bilirubin: 13.7 mg/dL — ABNORMAL HIGH (ref 0.3–1.2)

## 2021-08-31 LAB — ACETAMINOPHEN LEVEL: Acetaminophen (Tylenol), Serum: <10 ug/mL — ABNORMAL LOW (ref 10–30)

## 2021-08-31 MED ORDER — PANTOPRAZOLE SODIUM 40 MG IV SOLR
40.0000 mg | Freq: Every day | INTRAVENOUS | Status: DC
  Administered 2021-09-01 – 2021-09-08 (×9): 40 mg via INTRAVENOUS
  Filled 2021-08-31 (×9): qty 40

## 2021-08-31 MED ORDER — ALBUTEROL SULFATE HFA 108 (90 BASE) MCG/ACT IN AERS: 2.0000 | INHALATION_SPRAY | RESPIRATORY_TRACT | Status: DC | PRN

## 2021-08-31 MED ORDER — METOCLOPRAMIDE HCL 5 MG/ML IJ SOLN
10.0000 mg | Freq: Once | INTRAMUSCULAR | Status: AC
  Administered 2021-08-31: 10 mg via INTRAVENOUS
  Filled 2021-08-31: qty 2

## 2021-08-31 MED ORDER — AMLODIPINE BESYLATE 5 MG PO TABS
5.0000 mg | ORAL_TABLET | Freq: Every morning | ORAL | Status: DC
  Administered 2021-09-01 – 2021-09-02 (×2): 5 mg via ORAL
  Filled 2021-08-31 (×2): qty 1

## 2021-08-31 MED ORDER — ONDANSETRON HCL 4 MG/2ML IJ SOLN
INTRAMUSCULAR | Status: AC
  Administered 2021-08-31: 4 mg via INTRAVENOUS
  Filled 2021-08-31: qty 2

## 2021-08-31 MED ORDER — MORPHINE SULFATE (PF) 2 MG/ML IV SOLN
2.0000 mg | INTRAVENOUS | Status: DC | PRN
Start: 2021-08-31 — End: 2021-09-02
  Administered 2021-09-01 – 2021-09-02 (×6): 2 mg via INTRAVENOUS
  Filled 2021-08-31 (×6): qty 1

## 2021-08-31 MED ORDER — HEPARIN SODIUM (PORCINE) 5000 UNIT/ML IJ SOLN
5000.0000 [IU] | Freq: Three times a day (TID) | INTRAMUSCULAR | Status: DC
  Administered 2021-08-31 – 2021-09-02 (×5): 5000 [IU] via SUBCUTANEOUS
  Filled 2021-08-31 (×5): qty 1

## 2021-08-31 MED ORDER — MORPHINE SULFATE (PF) 2 MG/ML IV SOLN
INTRAVENOUS | Status: AC
  Administered 2021-08-31: 2 mg via INTRAVENOUS
  Filled 2021-08-31: qty 1

## 2021-08-31 MED ORDER — INSULIN ASPART 100 UNIT/ML IJ SOLN
0.0000 [IU] | Freq: Three times a day (TID) | INTRAMUSCULAR | Status: DC
  Administered 2021-09-01 – 2021-09-06 (×4): 1 [IU] via SUBCUTANEOUS

## 2021-08-31 MED ORDER — DIPHENHYDRAMINE HCL 50 MG/ML IJ SOLN
12.5000 mg | Freq: Once | INTRAMUSCULAR | Status: AC
  Administered 2021-08-31: 12.5 mg via INTRAVENOUS
  Filled 2021-08-31: qty 1

## 2021-08-31 MED ORDER — METOPROLOL SUCCINATE ER 50 MG PO TB24
50.0000 mg | ORAL_TABLET | Freq: Every morning | ORAL | Status: DC
  Administered 2021-09-01 – 2021-09-11 (×11): 50 mg via ORAL
  Filled 2021-08-31 (×11): qty 1

## 2021-08-31 MED ORDER — FLUTICASONE FUROATE-VILANTEROL 100-25 MCG/INH IN AEPB
1.0000 | INHALATION_SPRAY | Freq: Every morning | RESPIRATORY_TRACT | Status: DC
  Administered 2021-09-01 – 2021-09-11 (×11): 1 via RESPIRATORY_TRACT
  Filled 2021-08-31: qty 28

## 2021-08-31 MED ORDER — SODIUM CHLORIDE 0.9 % IV BOLUS
500.0000 mL | Freq: Once | INTRAVENOUS | Status: AC
  Administered 2021-08-31: 500 mL via INTRAVENOUS

## 2021-08-31 MED ORDER — INSULIN ASPART 100 UNIT/ML IJ SOLN
0.0000 [IU] | Freq: Every day | INTRAMUSCULAR | Status: DC
  Filled 2021-08-31: qty 1

## 2021-08-31 MED ORDER — LOSARTAN POTASSIUM-HCTZ 50-12.5 MG PO TABS: 1.0000 | ORAL_TABLET | Freq: Every morning | ORAL | Status: DC

## 2021-08-31 MED ORDER — ONDANSETRON HCL 4 MG/2ML IJ SOLN
4.0000 mg | Freq: Four times a day (QID) | INTRAMUSCULAR | Status: DC | PRN
  Administered 2021-09-01 – 2021-09-10 (×11): 4 mg via INTRAVENOUS
  Filled 2021-08-31 (×15): qty 2

## 2021-08-31 NOTE — ED Triage Notes (Signed)
Pt to er, pt states that she went to the doctor yesterday because here eyes were looking yellow, states that she had been taking Augmentin for a sinus infection, states that they drew some blood and set her up for an ultrasound.  States that after her blood work came back he wanted her seen sooner than later.

## 2021-08-31 NOTE — H&P (Signed)
History and Physical  Shelly Larson [id number] DOB: 08-Nov-1971 DOA: 08/31/2021  Referring physician: Rayna Sexton, PA-C  PCP: [facility]  Patient coming from: Home  Chief Complaint: Jaundice  HPI: Shelly Larson is a 50 y.o. female with medical history significant for hypertension, asthma, type 2 diabetes mellitus who presents to the emergency department after being has to go to the ED due to abnormal liver enzymes.  Patient complained of nasal congestion with sinus pressure about 2 weeks ago and she went to PCP who started her on Sudafed, this was followed with a virtual visit due to minimal relief from symptoms and she was prescribed with Augmentin and prednisone.  6 days ago (9/1), she started to complain of intermittent epigastric pain which was described as sharp/dull pain of moderate intensity, this was associated with nausea which was more consistent.  Abdominal pain worsens at night and sometimes with food.  She followed up with PCP yesterday (8/6), blood work was done and she was called today due to elevated liver enzymes and was asked to go to the emergency department for further evaluation and management. Patient complained of chronic history of right shoulder pain and that she has been taking Advil and acetaminophen every 4-6 hours for about a month.  She denies use of alcohol and denies painful/difficult swallowing.  ED Course:  In the emergency department, BP was 164/95 and other vital signs were within normal range.  Work-up in the ED shows normal CBC and BMP except for elevated liver enzymes (AST 1036, ALT 1476), total bilirubin 13.0, direct bilirubin 8.6, indirect bilirubin 5.1.  Urinalysis was positive for large bilirubin and glycosuria.  Acetaminophen level < 10.  Influenza A, B, SARS coronavirus 2 was negative.   Right upper quadrant ultrasound showed increased echogenicity of the liver which is most commonly seen with fatty infiltration of the liver.  There  are no obvious focal liver lesions. IV hydration was provided, Reglan and Benadryl were given.  Gastroenterologist was consulted, additional labs were recommended to be ordered by the ED physician with plan to see patient in the morning.  Hospitalist was asked to admit patient for further evaluation and management.  Review of Systems: Constitutional: Negative for chills and fever.  HENT: Negative for ear pain and sore throat.   Eyes: Negative for pain and visual disturbance.  Respiratory: Negative for cough, chest tightness and shortness of breath.   Cardiovascular: Negative for chest pain and palpitations.  Gastrointestinal: Positive for abdominal pain and nausea.  Negative for vomiting.  Endocrine: Negative for polyphagia and polyuria.  Genitourinary: Negative for decreased urine volume, dysuria, enuresis Musculoskeletal: Negative for arthralgias and back pain.  Skin: Negative for color change and rash.  Allergic/Immunologic: Negative for immunocompromised state.  Neurological: Negative for tremors, syncope, speech difficulty Hematological: Does not bruise/bleed easily.  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[place]) 2016   Transverse colon    Diabetes mellitus without complication ([place])    Dyspnea    Hypertension    Migraine    Past Surgical History:  Procedure Laterality Date   BAND HEMORRHOIDECTOMY     CHOLECYSTECTOMY  2010   [place], biliary pancreatitis   COLECTOMY  09/30/2015   distal transverse d/t adenocarcinoma   COLONOSCOPY N/A 06/11/2018   Procedure: COLONOSCOPY;  Surgeon: Daneil Dolin, MD;  [facility];  Service: Endoscopy;  Laterality: N/A;  1:45pm   DILITATION & CURRETTAGE/HYSTROSCOPY WITH NOVASURE ABLATION N/A 09/10/2018   Procedure: DILATATION & CURETTAGE/HYSTEROSCOPY WITH MINERVA  ENDOMETRIAL ABLATION;  Surgeon:  Jonnie Kind, MD;  Location: [facility];  Service: Gynecology;  Laterality: N/A;   POLYPECTOMY  06/11/2018   Procedure: POLYPECTOMY;  Surgeon: Daneil Dolin, MD;  [facility];  Service: Endoscopy;;  colon    Social History:  reports that she has never smoked. She has never used smokeless tobacco. She reports current alcohol use. She reports that she does not use drugs.   Allergies  Allergen Reactions   Lisinopril Cough    Family History  Problem Relation Age of Onset   Hypertension Mother    Diabetes Mellitus II Mother    Heart disease Sister        dx in late 69's   Breast cancer Paternal Grandmother    Breast cancer Paternal Aunt    Colon cancer Neg Hx      Prior to Admission medications   Medication Sig Start Date End Date Taking? Authorizing Provider  albuterol (VENTOLIN HFA) 108 (90 Base) MCG/ACT inhaler Inhale 2 puffs into the lungs every 4 (four) hours as needed for wheezing or shortness of breath.  10/17/17  Yes [provider]  amLODipine (NORVASC) 5 MG tablet TAKE 1 TABLET BY MOUTH EVERY DAY Patient taking differently: Take 5 mg by mouth every morning. 05/10/18  Yes Herminio Commons, MD  amphetamine-dextroamphetamine (ADDERALL XR) 20 MG 24 hr capsule Take 40 mg by mouth daily. 05/03/18  Yes [provider]  amphetamine-dextroamphetamine (ADDERALL) 10 MG tablet Take 10 mg by mouth See admin instructions. Take daily at 1:00PM.   Yes [provider]  cyclobenzaprine (FLEXERIL) 10 MG tablet Take 10 mg by mouth at bedtime as needed for muscle spasms.   Yes [provider]  fluticasone furoate-vilanterol (BREO ELLIPTA) 100-25 MCG/INH AEPB Inhale 1 puff into the lungs every morning.    Yes [provider]  losartan-hydrochlorothiazide (HYZAAR) 50-12.5 MG tablet Take 1 tablet by mouth every morning.    Yes [provider]  metFORMIN (GLUCOPHAGE) 500 MG tablet Take 1 tablet (500 mg total) by mouth 2 (two) times  daily with a meal. 11/14/18  Yes Nida, Marella Chimes, MD  metoprolol succinate (TOPROL-XL) 50 MG 24 hr tablet Take 50 mg by mouth every morning.  09/19/17  Yes [provider]  nitroGLYCERIN (NITROSTAT) 0.4 MG SL tablet Place 0.4 mg under the tongue every 5 (five) minutes as needed.  08/21/17  Yes [provider]  OVER THE COUNTER MEDICATION Apply 1 application topically daily as needed (for pain hemmorrhoids). rectacare OTC    Yes [provider]  Topiramate ER (TROKENDI XR) 100 MG CP24 Take 1 capsule by mouth at bedtime.   Yes [provider]    Physical Exam: BP (!) 166/104 (BP Location: Left Arm)   Pulse 78   Temp 98.3 F (36.8 C) (Oral)   Resp 17   Ht 6' (1.829 m)   Wt 122.5 kg   SpO2 97%   BMI 36.62 kg/m   General: 50 y.o. year-old female well developed well nourished in no acute distress.  Alert and oriented x3. HEENT: Sclera icterus.  NCAT, EOMI Neck: Supple, trachea medial Cardiovascular: Regular rate and rhythm with no rubs or gallops.  No thyromegaly or JVD noted.  No lower extremity edema. 2/4 pulses in all 4 extremities. Respiratory:  Clear to auscultation with no wheezes or rales. Good inspiratory effort. Abdomen: Soft, mild tenderness in epigastric area.  Normal bowel sounds x4 quadrants. Muskuloskeletal: No cyanosis, clubbing or edema noted bilaterally Neuro: CN II-XII intact, strength 5/5 x 4, sensation, reflexes intact Skin: Jaundiced.  No ulcerative lesions.  Skin is warm and dry Psychiatry: Judgement and insight appear normal. Mood is appropriate for condition and setting          Labs on Admission:  Basic Metabolic Panel: Recent Labs  Lab 08/31/21 1440  NA 137  K 3.5  CL 101  CO2 28  GLUCOSE 113*  BUN 15  CREATININE 1.04*  CALCIUM 9.3   Liver Function Tests: Recent Labs  Lab 08/31/21 1440 08/31/21 2021  AST 1,036*  --   ALT 1,476*  --   ALKPHOS 124  --   BILITOT 13.0* 13.7*  PROT 7.5  --   ALBUMIN 3.8  --     Recent Labs  Lab 08/31/21 1442  LIPASE 27   No results for input(s): AMMONIA in the last 168 hours. CBC: Recent Labs  Lab 08/31/21 1440  WBC 4.9  NEUTROABS 3.0  HGB 14.7  HCT 45.0  MCV 86.9  PLT 160   Cardiac Enzymes: No results for input(s): CKTOTAL, CKMB, CKMBINDEX, TROPONINI in the last 168 hours.  BNP (last 3 results) No results for input(s): BNP in the last 8760 hours.  ProBNP (last 3 results) No results for input(s): PROBNP in the last 8760 hours.  CBG: No results for input(s): GLUCAP in the last 168 hours.  Radiological Exams on Admission: US Abdomen Limited RUQ (LIVER/GB)  Result Date: 08/31/2021 CLINICAL DATA:  Elevated LFTs EXAM: ULTRASOUND ABDOMEN LIMITED RIGHT UPPER QUADRANT COMPARISON:  CT November 21, 2010. FINDINGS: Gallbladder: No gallstones or wall thickening visualized. No sonographic Murphy sign noted by sonographer. Common bile duct: Diameter: 5 mm Liver: No focal lesion identified. Diffusely increased parenchymal echogenicity with decreased acoustic penetration. Portal vein is patent on color Doppler imaging with normal direction of blood flow towards the liver. Other: None. IMPRESSION: The echogenicity of the liver is increased. This is a nonspecific finding but is most commonly seen with fatty infiltration of the liver. There are no obvious focal liver lesions. Electronically Signed   By: Dahlia Bailiff M.D.   On: 08/31/2021 18:27    EKG: I independently viewed the EKG done and my findings are as followed: EKG was not done in the ED  Assessment/Plan Present on Admission:  Abnormal liver enzymes  Active Problems:   Essential hypertension   Abnormal liver enzymes   Serum total bilirubin elevated   Abdominal pain   Nausea   Asthma   Type 2 diabetes mellitus (HCC)   Abdominal pain and nausea in the setting of elevated liver enzymes possibly due to multifactorial including NSAIDs/Tylenol use Patient was on Advil and acetaminophen every 4-6 hours  for about a month AST 1,026, ALT 1,476 RUQ was only suggestive of fatty infiltration of liver Continue IV Zofran 4 mg every 6 hours as needed Continue IV morphine 2 mg every 4 hours as needed No indication for any further imaging studies tonight per GI recommendation (as reported per ED PA) Hepatitis panel pending Gastroenterology was consulted and will follow up with patient in the morning per ED physician  Elevated total bilirubin in the setting of above Total bilirubin 13.7; direct bilirubin 8.6, indirect bili 4.1 LDH and haptoglobin levels will be checked Continue management as per above  Essential hypertension (  uncontrolled) Continue amlodipine, Hyzaar and Toprol-XL  Asthma Continue albuterol and Breo Ellipta  Type 2 diabetes mellitus Continues on sliding scale and hypoglycemia protocol Metformin will be held at this time  DVT prophylaxis: Heparin subcu, SCDs  Code Status: Full code  Family Communication: Family at bedside (all questions answered to satisfaction  Disposition Plan:  Patient is from:                        home Anticipated DC to:                   SNF or family members home Anticipated DC date:               2-3 days Anticipated DC barriers:          Patient requires inpatient management due to elevated liver enzymes and elevated total bilirubin pending gastroenterology consult    Consults called: Gastroenterology  Admission status: Observation   Bernadette Hoit MD [facility]   08/31/2021, 11:03 PM

## 2021-08-31 NOTE — ED Notes (Signed)
Patient given warm blanket and pillow at this time. Family at bedside

## 2021-08-31 NOTE — ED Provider Notes (Signed)
[facility] EMERGENCY DEPARTMENT Provider Note   CSN: [id number] Arrival date & time: 08/31/21  1356     History Chief Complaint  Patient presents with   Jaundice    Shelly Larson is a 50 y.o. female.  HPI Patient is a 50 year old female with a history of biliary acute pancreatitis, diabetes mellitus, hypertension, who was sent to the emergency department due to jaundice.  Patient states 2 weeks ago she began experiencing congestion and sinus pressure.  She was evaluated by her PCP and started on Sudafed.  She states she was not getting relief with Sudafed so she had a virtual visit and was started on prednisone as well as Augmentin which she took in addition to the Sudafed.  She began experiencing intermittent upper abdominal pain that she states has mildly improved as well as persistent nausea and intermittent headaches.  She states that she followed up with her PCP yesterday who obtained basic labs and called her back today telling her that her liver enzymes were elevated and told her to come to the emergency department immediately for further evaluation.  Of note, patient states she has been experiencing right shoulder pain for the past month and has been taking dual action Advil with acetaminophen every 4-6 hours.  She does not drink alcohol.    Past Medical History:  Diagnosis Date   ADHD    Anemia    Arthritis    Biliary acute pancreatitis 2010   Colon cancer ([place]) 2016   Transverse colon    Diabetes mellitus without complication ([place])    Dyspnea    Hypertension    Migraine     Patient Active Problem List   Diagnosis Date Noted   Abnormal uterine bleeding (AUB) 08/06/2019   History of endometrial ablation 08/06/2019   Menorrhagia with regular cycle 09/10/2018   Prediabetes 07/09/2018   Class 2 severe obesity due to excess calories with serious comorbidity and body mass index (BMI) of 37.0 to 37.9 in adult ([place]) 07/09/2018   Essential hypertension, benign 07/09/2018    Mixed hyperlipidemia 07/09/2018   H/O colon cancer, stage II 05/07/2018   Change in bowel function 10/16/2017   Hemorrhoid prolapse 10/16/2017   Rectal bleeding 10/16/2017   Colon cancer ([place]) 12/25/2014    Past Surgical History:  Procedure Laterality Date   BAND HEMORRHOIDECTOMY     CHOLECYSTECTOMY  2010   [place], biliary pancreatitis   COLECTOMY  09/30/2015   distal transverse d/t adenocarcinoma   COLONOSCOPY N/A 06/11/2018   Procedure: COLONOSCOPY;  Surgeon: Daneil Dolin, MD;  Location: [facility];  Service: Endoscopy;  Laterality: N/A;  1:45pm   DILITATION & CURRETTAGE/HYSTROSCOPY WITH NOVASURE ABLATION N/A 09/10/2018   Procedure: DILATATION & CURETTAGE/HYSTEROSCOPY WITH MINERVA  ENDOMETRIAL ABLATION;  Surgeon: Jonnie Kind, MD;  Location: [facility] ORS;  Service: Gynecology;  Laterality: N/A;   POLYPECTOMY  06/11/2018   Procedure: POLYPECTOMY;  Surgeon: Daneil Dolin, MD;  Location: [facility];  Service: Endoscopy;;  colon     OB History     Gravida  0   Para  0   Term  0   Preterm  0   AB  0   Living  0      SAB  0   IAB  0   Ectopic  0   Multiple  0   Live Births  0           Family History  Problem Relation Age of Onset  Hypertension Mother    Diabetes Mellitus II Mother    Heart disease Sister        dx in late 72's   Breast cancer Paternal Grandmother    Breast cancer Paternal Aunt    Colon cancer Neg Hx     Social History   Tobacco Use   Smoking status: Never   Smokeless tobacco: Never  Vaping Use   Vaping Use: Never used  Substance Use Topics   Alcohol use: Yes    Comment: rare   Drug use: No    Home Medications Prior to Admission medications   Medication Sig Start Date End Date Taking? Authorizing Provider  albuterol (VENTOLIN HFA) 108 (90 Base) MCG/ACT inhaler Inhale 2 puffs into the lungs every 4 (four) hours as needed for wheezing or shortness of breath.  10/17/17  Yes [provider]  amLODipine  (NORVASC) 5 MG tablet TAKE 1 TABLET BY MOUTH EVERY DAY Patient taking differently: Take 5 mg by mouth every morning. 05/10/18  Yes Herminio Commons, MD  amphetamine-dextroamphetamine (ADDERALL XR) 20 MG 24 hr capsule Take 40 mg by mouth daily. 05/03/18  Yes [provider]  amphetamine-dextroamphetamine (ADDERALL) 10 MG tablet Take 10 mg by mouth See admin instructions. Take daily at 1:00PM.   Yes [provider]  cyclobenzaprine (FLEXERIL) 10 MG tablet Take 10 mg by mouth at bedtime as needed for muscle spasms.   Yes [provider]  fluticasone furoate-vilanterol (BREO ELLIPTA) 100-25 MCG/INH AEPB Inhale 1 puff into the lungs every morning.    Yes [provider]  losartan-hydrochlorothiazide (HYZAAR) 50-12.5 MG tablet Take 1 tablet by mouth every morning.    Yes [provider]  metFORMIN (GLUCOPHAGE) 500 MG tablet Take 1 tablet (500 mg total) by mouth 2 (two) times daily with a meal. 11/14/18  Yes Nida, Marella Chimes, MD  metoprolol succinate (TOPROL-XL) 50 MG 24 hr tablet Take 50 mg by mouth every morning.  09/19/17  Yes [provider]  nitroGLYCERIN (NITROSTAT) 0.4 MG SL tablet Place 0.4 mg under the tongue every 5 (five) minutes as needed.  08/21/17  Yes [provider]  OVER THE COUNTER MEDICATION Apply 1 application topically daily as needed (for pain hemmorrhoids). [facility]acare OTC    Yes [provider]  Topiramate ER (TROKENDI XR) 100 MG CP24 Take 1 capsule by mouth at bedtime.   Yes [provider]    Allergies    Lisinopril  Review of Systems   Review of Systems  All other systems reviewed and are negative. Ten systems reviewed and are negative for acute change, except as noted in the HPI.   Physical Exam Updated Vital Signs BP (!) 164/95   Pulse 77   Temp 98.3 F (36.8 C) (Oral)   Resp 14   Ht 6' (1.829 m)   Wt 122.5 kg   SpO2 98%   BMI 36.62 kg/m   Physical Exam Vitals and nursing note  reviewed.  Constitutional:      General: She is not in acute distress.    Appearance: Normal appearance. She is not ill-appearing, toxic-appearing or diaphoretic.  HENT:     Head: Normocephalic and atraumatic.     Right Ear: External ear normal.     Left Ear: External ear normal.     Nose: Nose normal.     Mouth/Throat:     Mouth: Mucous membranes are moist.     Pharynx: Oropharynx is clear. No oropharyngeal exudate or posterior oropharyngeal erythema.  Eyes:     General: Scleral icterus present.        Right eye: No discharge.        Left eye: No discharge.     Extraocular Movements: Extraocular movements intact.     Conjunctiva/sclera: Conjunctivae normal.  Cardiovascular:     Rate and Rhythm: Normal rate and regular rhythm.     Pulses: Normal pulses.     Heart sounds: Normal heart sounds. No murmur heard.   No friction rub. No gallop.  Pulmonary:     Effort: Pulmonary effort is normal. No respiratory distress.     Breath sounds: Normal breath sounds. No stridor. No wheezing, rhonchi or rales.  Abdominal:     General: Abdomen is flat.     Palpations: Abdomen is soft.     Tenderness: There is abdominal tenderness.     Comments: Protuberant abdomen that is soft.  Mild to moderate tenderness noted along the epigastrium.  Musculoskeletal:        General: Normal range of motion.     Cervical back: Normal range of motion and neck supple. No tenderness.  Skin:    General: Skin is warm and dry.     Coloration: Skin is jaundiced.  Neurological:     General: No focal deficit present.     Mental Status: She is alert and oriented to person, place, and time.  Psychiatric:        Mood and Affect: Mood normal.        Behavior: Behavior normal.   ED Results / Procedures / Treatments   Labs (all labs ordered are listed, but only abnormal results are displayed) Labs Reviewed  CBC WITH DIFFERENTIAL/PLATELET - Abnormal; Notable for the following components:      Result Value   RBC  5.18 (*)    RDW 19.3 (*)    All other components within normal limits  COMPREHENSIVE METABOLIC PANEL - Abnormal; Notable for the following components:   Glucose, Bld 113 (*)    Creatinine, Ser 1.04 (*)    AST 1,036 (*)    ALT 1,476 (*)    Total Bilirubin 13.0 (*)    All other components within normal limits  ACETAMINOPHEN LEVEL - Abnormal; Notable for the following components:   Acetaminophen (Tylenol), Serum <10 (*)    All other components within normal limits  RESP PANEL BY RT-PCR (FLU A&B, COVID) ARPGX2  LIPASE, BLOOD  PROTIME-INR  URINALYSIS, ROUTINE W REFLEX MICROSCOPIC  BILIRUBIN, FRACTIONATED(TOT/DIR/INDIR)  ANTINUCLEAR ANTIBODIES, IFA  SEDIMENTATION RATE  HEPATITIS PANEL, ACUTE  EPSTEIN-BARR VIRUS VCA, IGM  CMV IGM  HSV(HERPES SIMPLEX VRS) I + II AB-IGM  PROTIME-INR  HEPATIC FUNCTION PANEL   EKG None  Radiology US Abdomen Limited RUQ (LIVER/GB)  Result Date: 08/31/2021 CLINICAL DATA:  Elevated LFTs EXAM: ULTRASOUND ABDOMEN LIMITED RIGHT UPPER QUADRANT COMPARISON:  CT November 21, 2010. FINDINGS: Gallbladder: No gallstones or wall thickening visualized. No sonographic Murphy sign noted by sonographer. Common bile duct: Diameter: 5 mm Liver: No focal lesion identified. Diffusely increased parenchymal echogenicity with decreased acoustic penetration. Portal vein is patent on color Doppler imaging with normal direction of blood flow towards the liver. Other: None. IMPRESSION: The echogenicity of the liver is increased. This is a nonspecific finding but is most commonly seen with fatty infiltration of the liver. There are no obvious focal liver lesions. Electronically Signed   By: Dahlia Bailiff M.D.   On: 08/31/2021 18:27    Procedures Procedures   Medications Ordered in  ED Medications  sodium chloride 0.9 % bolus 500 mL (has no administration in time range)  diphenhydrAMINE (BENADRYL) injection 12.5 mg (has no administration in time range)  metoCLOPramide (REGLAN)  injection 10 mg (has no administration in time range)    ED Course  I have reviewed the triage vital signs and the nursing notes.  Pertinent labs & imaging results that were available during my care of the patient were reviewed by me and considered in my medical decision making (see chart for details).  Clinical Course as of 08/31/21 2055  Wed Aug 31, 2021  1820 AST(!): 1,036 [LJ]  1820 ALT(!): 1,476 [LJ]  1820 Total Bilirubin(!): 13.0 [LJ]  2010 Patient discussed with gastroenterology.  Recommend we also obtain PT/INR, fractionated bilirubin, ESR, ANA, hepatitis panel, CMV, EBV, HSV, as well as a respiratory panel.  Recommend admission.  Also recommend that we go ahead and order an additional PT/INR and hepatic function panel for tomorrow.  They will consult on the patient. [LJ]  2823 50 year old female here after she saw her doctor and was noted to have jaundice.  She had been treated with antibiotics recently for sinus infection.  She is complaining of headache and some nausea.  Labs showing elevated T bili.  Ultrasound does not show evidence of stones or obstruction.  Will need admission to the hospital for further management. [MB]    Clinical Course User Index [LJ] Rayna Sexton, PA-C [MB] Hayden Rasmussen, MD   MDM Rules/Calculators/A&P                          Pt is a 50 y.o. female who presents to the emergency department due to nausea, headaches, as well as jaundice.  Labs: CBC with an RBC of 5.18 and an RDW of 19.3. CMP with a glucose of 113, creatinine of 1.04, AST of 1036, ALT of 1476, total bilirubin of 13. Acetaminophen less than 10. Lipase of 27. PT/INR within normal limits. UA, fractionated bilirubin, ESR, ANA, hepatitis panel, EBV, HSV, CMV, respiratory panel are pending.  Imaging: Ultrasound of the right upper quadrant show a 5 mm common bile duct.  No focal lesion.  Diffusely increased parenchymal echogenicity with decreased acoustic penetration.  Portal vein  is patent on color Doppler imaging with normal direction of blood flow towards the liver.  I, Rayna Sexton, PA-C, personally reviewed and evaluated these images and lab results as part of my medical decision-making.  Unsure the source of the patient's symptoms.  She has been regularly taking Tylenol for the past month but it appears that she likely has not been exceeding 1 g of Tylenol per day.  She also was experiencing a sinus infection for the past 2 weeks and was started on Augmentin, Sudafed, as well as prednisone.  Augmentin could possibly be associated with her hepatic injury today.  She does not drink alcohol.  Denies any history of autoimmune conditions herself or in her family.  Patient was discussed with gastroenterology and they recommended the additional labs that are pending above.  They will consult on the patient.  Did not feel that further imaging was warranted tonight.  We will discuss with the medicine team for admission.  Note: Portions of this report may have been transcribed using voice recognition software. Every effort was made to ensure accuracy; however, inadvertent computerized transcription errors may be present.   Final Clinical Impression(s) / ED Diagnoses Final diagnoses:  LFT elevation  LFTs abnormal  Hepatocellular injury   Rx / DC Orders ED Discharge Orders     None        Rayna Sexton, PA-C 08/31/21 2058    Hayden Rasmussen, MD 09/01/21 1047

## 2021-09-01 ENCOUNTER — Observation Stay (HOSPITAL_COMMUNITY): Payer: 59

## 2021-09-01 ENCOUNTER — Encounter (HOSPITAL_COMMUNITY): Payer: Self-pay | Admitting: Internal Medicine

## 2021-09-01 DIAGNOSIS — E669 Obesity, unspecified: Secondary | ICD-10-CM | POA: Diagnosis present

## 2021-09-01 DIAGNOSIS — R894 Abnormal immunological findings in specimens from other organs, systems and tissues: Secondary | ICD-10-CM | POA: Diagnosis not present

## 2021-09-01 DIAGNOSIS — R7989 Other specified abnormal findings of blood chemistry: Secondary | ICD-10-CM | POA: Diagnosis not present

## 2021-09-01 DIAGNOSIS — Z86010 Personal history of colonic polyps: Secondary | ICD-10-CM | POA: Diagnosis not present

## 2021-09-01 DIAGNOSIS — R81 Glycosuria: Secondary | ICD-10-CM | POA: Diagnosis present

## 2021-09-01 DIAGNOSIS — Z9049 Acquired absence of other specified parts of digestive tract: Secondary | ICD-10-CM | POA: Diagnosis not present

## 2021-09-01 DIAGNOSIS — R1010 Upper abdominal pain, unspecified: Secondary | ICD-10-CM | POA: Diagnosis not present

## 2021-09-01 DIAGNOSIS — K769 Liver disease, unspecified: Secondary | ICD-10-CM

## 2021-09-01 DIAGNOSIS — R17 Unspecified jaundice: Secondary | ICD-10-CM | POA: Diagnosis not present

## 2021-09-01 DIAGNOSIS — B0081 Herpesviral hepatitis: Secondary | ICD-10-CM | POA: Diagnosis present

## 2021-09-01 DIAGNOSIS — I10 Essential (primary) hypertension: Secondary | ICD-10-CM | POA: Diagnosis present

## 2021-09-01 DIAGNOSIS — Z85038 Personal history of other malignant neoplasm of large intestine: Secondary | ICD-10-CM | POA: Diagnosis not present

## 2021-09-01 DIAGNOSIS — J45909 Unspecified asthma, uncomplicated: Secondary | ICD-10-CM | POA: Diagnosis present

## 2021-09-01 DIAGNOSIS — K760 Fatty (change of) liver, not elsewhere classified: Secondary | ICD-10-CM | POA: Diagnosis present

## 2021-09-01 DIAGNOSIS — R609 Edema, unspecified: Secondary | ICD-10-CM | POA: Diagnosis present

## 2021-09-01 DIAGNOSIS — Z20822 Contact with and (suspected) exposure to covid-19: Secondary | ICD-10-CM | POA: Diagnosis present

## 2021-09-01 DIAGNOSIS — Z8249 Family history of ischemic heart disease and other diseases of the circulatory system: Secondary | ICD-10-CM | POA: Diagnosis not present

## 2021-09-01 DIAGNOSIS — R945 Abnormal results of liver function studies: Secondary | ICD-10-CM

## 2021-09-01 DIAGNOSIS — Z6836 Body mass index (BMI) 36.0-36.9, adult: Secondary | ICD-10-CM | POA: Diagnosis not present

## 2021-09-01 DIAGNOSIS — J4520 Mild intermittent asthma, uncomplicated: Secondary | ICD-10-CM | POA: Diagnosis not present

## 2021-09-01 DIAGNOSIS — N179 Acute kidney failure, unspecified: Secondary | ICD-10-CM | POA: Diagnosis present

## 2021-09-01 DIAGNOSIS — E119 Type 2 diabetes mellitus without complications: Secondary | ICD-10-CM | POA: Diagnosis present

## 2021-09-01 DIAGNOSIS — R1011 Right upper quadrant pain: Secondary | ICD-10-CM | POA: Diagnosis not present

## 2021-09-01 DIAGNOSIS — Z833 Family history of diabetes mellitus: Secondary | ICD-10-CM | POA: Diagnosis not present

## 2021-09-01 DIAGNOSIS — B179 Acute viral hepatitis, unspecified: Secondary | ICD-10-CM | POA: Diagnosis present

## 2021-09-01 DIAGNOSIS — Z79899 Other long term (current) drug therapy: Secondary | ICD-10-CM | POA: Diagnosis not present

## 2021-09-01 DIAGNOSIS — F909 Attention-deficit hyperactivity disorder, unspecified type: Secondary | ICD-10-CM | POA: Diagnosis present

## 2021-09-01 DIAGNOSIS — Z23 Encounter for immunization: Secondary | ICD-10-CM | POA: Diagnosis not present

## 2021-09-01 DIAGNOSIS — B169 Acute hepatitis B without delta-agent and without hepatic coma: Secondary | ICD-10-CM | POA: Diagnosis present

## 2021-09-01 DIAGNOSIS — K2970 Gastritis, unspecified, without bleeding: Secondary | ICD-10-CM | POA: Diagnosis present

## 2021-09-01 DIAGNOSIS — R748 Abnormal levels of other serum enzymes: Secondary | ICD-10-CM | POA: Diagnosis not present

## 2021-09-01 DIAGNOSIS — M25511 Pain in right shoulder: Secondary | ICD-10-CM | POA: Diagnosis present

## 2021-09-01 DIAGNOSIS — E782 Mixed hyperlipidemia: Secondary | ICD-10-CM | POA: Diagnosis present

## 2021-09-01 DIAGNOSIS — K5900 Constipation, unspecified: Secondary | ICD-10-CM | POA: Diagnosis present

## 2021-09-01 LAB — CBC
HCT: 42.6 % (ref 36.0–46.0)
Hemoglobin: 13.6 g/dL (ref 12.0–15.0)
MCH: 27.8 pg (ref 26.0–34.0)
MCHC: 31.9 g/dL (ref 30.0–36.0)
MCV: 87.1 fL (ref 80.0–100.0)
Platelets: 150 10*3/uL (ref 150–400)
RBC: 4.89 MIL/uL (ref 3.87–5.11)
RDW: 19.2 % — ABNORMAL HIGH (ref 11.5–15.5)
WBC: 5.5 10*3/uL (ref 4.0–10.5)
nRBC: 0 % (ref 0.0–0.2)

## 2021-09-01 LAB — COMPREHENSIVE METABOLIC PANEL
ALT: 1446 U/L — ABNORMAL HIGH (ref 0–44)
AST: 1083 U/L — ABNORMAL HIGH (ref 15–41)
Albumin: 3.4 g/dL — ABNORMAL LOW (ref 3.5–5.0)
Alkaline Phosphatase: 116 U/L (ref 38–126)
Anion gap: 9 (ref 5–15)
BUN: 16 mg/dL (ref 6–20)
CO2: 27 mmol/L (ref 22–32)
Calcium: 9 mg/dL (ref 8.9–10.3)
Chloride: 101 mmol/L (ref 98–111)
Creatinine, Ser: 0.98 mg/dL (ref 0.44–1.00)
GFR, Estimated: >60 mL/min (ref >60)
Glucose, Bld: 125 mg/dL — ABNORMAL HIGH (ref 70–99)
Potassium: 3.7 mmol/L (ref 3.5–5.1)
Sodium: 137 mmol/L (ref 135–145)
Total Bilirubin: 13.1 mg/dL — ABNORMAL HIGH (ref 0.3–1.2)
Total Protein: 6.8 g/dL (ref 6.5–8.1)

## 2021-09-01 LAB — APTT: aPTT: 41 seconds — ABNORMAL HIGH (ref 24–36)

## 2021-09-01 LAB — HEPATIC FUNCTION PANEL
ALT: 1402 U/L — ABNORMAL HIGH (ref 0–44)
AST: 1103 U/L — ABNORMAL HIGH (ref 15–41)
Albumin: 3.6 g/dL (ref 3.5–5.0)
Alkaline Phosphatase: 119 U/L (ref 38–126)
Bilirubin, Direct: 8.5 mg/dL — ABNORMAL HIGH (ref 0.0–0.2)
Indirect Bilirubin: 4.9 mg/dL — ABNORMAL HIGH (ref 0.3–0.9)
Total Bilirubin: 13.4 mg/dL — ABNORMAL HIGH (ref 0.3–1.2)
Total Protein: 7.1 g/dL (ref 6.5–8.1)

## 2021-09-01 LAB — CBG MONITORING, ED
Glucose-Capillary: 112 mg/dL — ABNORMAL HIGH (ref 70–99)
Glucose-Capillary: 120 mg/dL — ABNORMAL HIGH (ref 70–99)
Glucose-Capillary: 122 mg/dL — ABNORMAL HIGH (ref 70–99)

## 2021-09-01 LAB — GLUCOSE, CAPILLARY
Glucose-Capillary: 138 mg/dL — ABNORMAL HIGH (ref 70–99)
Glucose-Capillary: 98 mg/dL (ref 70–99)

## 2021-09-01 LAB — PROTIME-INR
INR: 1 (ref 0.8–1.2)
INR: 1.1 (ref 0.8–1.2)
Prothrombin Time: 13.6 seconds (ref 11.4–15.2)
Prothrombin Time: 13.7 seconds (ref 11.4–15.2)

## 2021-09-01 LAB — MAGNESIUM: Magnesium: 2.3 mg/dL (ref 1.7–2.4)

## 2021-09-01 LAB — HIV ANTIBODY (ROUTINE TESTING W REFLEX): HIV Screen 4th Generation wRfx: NONREACTIVE

## 2021-09-01 LAB — PHOSPHORUS: Phosphorus: 3.9 mg/dL (ref 2.5–4.6)

## 2021-09-01 LAB — HEMOGLOBIN A1C
Hgb A1c MFr Bld: 6.2 % — ABNORMAL HIGH (ref 4.8–5.6)
Mean Plasma Glucose: 131.24 mg/dL

## 2021-09-01 LAB — HCG, QUANTITATIVE, PREGNANCY: hCG, Beta Chain, Quant, S: <1 m[IU]/mL (ref <5)

## 2021-09-01 LAB — LACTATE DEHYDROGENASE: LDH: 509 U/L — ABNORMAL HIGH (ref 98–192)

## 2021-09-01 MED ORDER — ALBUTEROL SULFATE (2.5 MG/3ML) 0.083% IN NEBU: 2.5000 mg | INHALATION_SOLUTION | RESPIRATORY_TRACT | Status: DC | PRN

## 2021-09-01 MED ORDER — ONDANSETRON HCL 4 MG/2ML IJ SOLN
4.0000 mg | Freq: Four times a day (QID) | INTRAMUSCULAR | Status: DC
  Administered 2021-09-02 – 2021-09-03 (×5): 4 mg via INTRAVENOUS
  Filled 2021-09-01 (×5): qty 2

## 2021-09-01 MED ORDER — SODIUM CHLORIDE 0.9 % IV SOLN
12.5000 mg | Freq: Four times a day (QID) | INTRAVENOUS | Status: DC | PRN
  Filled 2021-09-01: qty 0.5

## 2021-09-01 MED ORDER — HYDROCHLOROTHIAZIDE 12.5 MG PO CAPS
12.5000 mg | ORAL_CAPSULE | Freq: Every day | ORAL | Status: DC
  Administered 2021-09-01: 12.5 mg via ORAL
  Filled 2021-09-01 (×2): qty 1

## 2021-09-01 MED ORDER — LOSARTAN POTASSIUM 50 MG PO TABS
50.0000 mg | ORAL_TABLET | Freq: Every day | ORAL | Status: DC
  Administered 2021-09-01: 50 mg via ORAL
  Filled 2021-09-01: qty 1
  Filled 2021-09-01: qty 2

## 2021-09-01 NOTE — ED Notes (Signed)
ED TO INPATIENT HANDOFF REPORT  ED Nurse Name and Phone #:   S Name/Age/Gender Shelly Larson 50 y.o. female Room/Bed: APFT24/APFT24  Code Status   Code Status: Full Code  Home/SNF/Other Home Patient oriented to: self, place, time, and situation Is this baseline? Yes   Triage Complete: Triage complete  Chief Complaint Abnormal liver enzymes [R74.8] Acute hepatitis [B17.9]  Triage Note Pt to er, pt states that she went to the doctor yesterday because here eyes were looking yellow, states that she had been taking Augmentin for a sinus infection, states that they drew some blood and set her up for an ultrasound.  States that after her blood work came back he wanted her seen sooner than later.     Allergies Allergies  Allergen Reactions   Lisinopril Cough    Level of Care/Admitting Diagnosis ED Disposition     ED Disposition  Admit   Condition  --   [place]: [facility] [id number]  Level of Care: Med-Surg [16]  Covid Evaluation: Confirmed COVID Negative  Diagnosis: Acute hepatitis [id number]  Admitting Physician: Thereasa Solo, JEFFREY T [[id number]]  Attending Physician: Thereasa Solo, JEFFREY T [[id number]]  Estimated length of stay: past midnight tomorrow  Certification:: I certify this patient will need inpatient services for at least 2 midnights          B Medical/Surgery History Past Medical History:  Diagnosis Date   ADHD    Anemia    Arthritis    Biliary acute pancreatitis 2010   Colon cancer ([place]) 2016   Transverse colon    Diabetes mellitus without complication ([place])    Dyspnea    Hypertension    Migraine    Past Surgical History:  Procedure Laterality Date   BAND HEMORRHOIDECTOMY     CHOLECYSTECTOMY  2010   [place], biliary pancreatitis   COLECTOMY  09/30/2015   distal transverse d/t adenocarcinoma   COLONOSCOPY N/A 06/11/2018   Procedure: COLONOSCOPY;  Surgeon: Daneil Dolin, MD;  Location: [facility];  Service: Endoscopy;   Laterality: N/A;  1:45pm   DILITATION & CURRETTAGE/HYSTROSCOPY WITH NOVASURE ABLATION N/A 09/10/2018   Procedure: DILATATION & CURETTAGE/HYSTEROSCOPY WITH MINERVA  ENDOMETRIAL ABLATION;  Surgeon: Jonnie Kind, MD;  Location: [facility] ORS;  Service: Gynecology;  Laterality: N/A;   POLYPECTOMY  06/11/2018   Procedure: POLYPECTOMY;  Surgeon: Daneil Dolin, MD;  Location: [facility] SU[facility];  Service: Endoscopy;;  colon     A IV Location/Drains/Wounds Patient Lines/Drains/Airways Status     Active Line/Drains/Airways     Name Placement date Placement time Site Days   Peripheral IV 08/31/21 20 G Right Forearm 08/31/21  2026  Forearm  1            Intake/Output Last 24 hours  Intake/Output Summary (Last 24 hours) at 09/01/2021 1511 Last data filed at 08/31/2021 2221 Gross per 24 hour  Intake 500 ml  Output --  Net 500 ml    Labs/Imaging Results for orders placed or performed during the hospital encounter of 08/31/21 (from the past 48 hour(s))  CBC with Differential     Status: Abnormal   Collection Time: 08/31/21  2:40 PM  Result Value Ref Range   WBC 4.9 4.0 - 10.5 K/uL   RBC 5.18 (H) 3.87 - 5.11 MIL/uL   Hemoglobin 14.7 12.0 - 15.0 g/dL   HCT 45.0 36.0 - 46.0 %   MCV 86.9 80.0 - 100.0 fL   MCH 28.4 26.0 - 34.0 pg  MCHC 32.7 30.0 - 36.0 g/dL   RDW 19.3 (H) 11.5 - 15.5 %   Platelets 160 150 - 400 K/uL   nRBC 0.0 0.0 - 0.2 %   Neutrophils Relative % 60 %   Neutro Abs 3.0 1.7 - 7.7 K/uL   Lymphocytes Relative 19 %   Lymphs Abs 1.0 0.7 - 4.0 K/uL   Monocytes Relative 16 %   Monocytes Absolute 0.8 0.1 - 1.0 K/uL   Eosinophils Relative 3 %   Eosinophils Absolute 0.2 0.0 - 0.5 K/uL   Basophils Relative 1 %   Basophils Absolute 0.1 0.0 - 0.1 K/uL   Immature Granulocytes 1 %   Abs Immature Granulocytes 0.03 0.00 - 0.07 K/uL    Comment: Performed at [facility], [address] [place], [place] [address]  Comprehensive metabolic panel     Status: Abnormal   Collection Time:  08/31/21  2:40 PM  Result Value Ref Range   Sodium 137 135 - 145 mmol/L   Potassium 3.5 3.5 - 5.1 mmol/L   Chloride 101 98 - 111 mmol/L   CO2 28 22 - 32 mmol/L   Glucose, Bld 113 (H) 70 - 99 mg/dL    Comment: Glucose reference range applies only to samples taken after fasting for at least 8 hours.   BUN 15 6 - 20 mg/dL   Creatinine, Ser 1.04 (H) 0.44 - 1.00 mg/dL   Calcium 9.3 8.9 - 10.3 mg/dL   Total Protein 7.5 6.5 - 8.1 g/dL   Albumin 3.8 3.5 - 5.0 g/dL   AST 1,036 (H) 15 - 41 U/L   ALT 1,476 (H) 0 - 44 U/L   Alkaline Phosphatase 124 38 - 126 U/L   Total Bilirubin 13.0 (H) 0.3 - 1.2 mg/dL   GFR, Estimated >60 >60 mL/min    Comment: (NOTE) Calculated using the CKD-EPI Creatinine Equation (2021)    Anion gap 8 5 - 15    Comment: Performed at [facility], [address], [place], [place] [address]  Acetaminophen level     Status: Abnormal   Collection Time: 08/31/21  2:40 PM  Result Value Ref Range   Acetaminophen (Tylenol), Serum <10 (L) 10 - 30 ug/mL    Comment: (NOTE) Therapeutic concentrations vary significantly. A range of 10-30 ug/mL  may be an effective concentration for many patients. However, some  are best treated at concentrations outside of this range. Acetaminophen concentrations >150 ug/mL at 4 hours after ingestion  and >50 ug/mL at 12 hours after ingestion are often associated with  toxic reactions.  Performed at [facility], [address], [place], [place] [address]   Lipase, blood     Status: None   Collection Time: 08/31/21  2:42 PM  Result Value Ref Range   Lipase 27 11 - 51 U/L    Comment: Performed at [facility], [address], [place], [place] [address]  Urinalysis, Routine w reflex microscopic Urine, Clean Catch     Status: Abnormal   Collection Time: 08/31/21  8:05 PM  Result Value Ref Range   Color, Urine YELLOW YELLOW   APPearance CLEAR CLEAR   Specific Gravity, Urine >1.030 (H) 1.005 - 1.030   pH 5.0 5.0 - 8.0   Glucose, UA 100 (A)  NEGATIVE mg/dL   Hgb urine dipstick NEGATIVE NEGATIVE   Bilirubin Urine LARGE (A) NEGATIVE   Ketones, ur NEGATIVE NEGATIVE mg/dL   Protein, ur 30 (A) NEGATIVE mg/dL   Nitrite NEGATIVE NEGATIVE   Leukocytes,Ua TRACE (  A) NEGATIVE    Comment: Performed at [facility], [address], [place], [place] [address]  Urinalysis, Microscopic (reflex)     Status: Abnormal   Collection Time: 08/31/21  8:05 PM  Result Value Ref Range   RBC / HPF NONE SEEN 0 - 5 RBC/hpf   WBC, UA 6-10 0 - 5 WBC/hpf   Bacteria, UA RARE (A) NONE SEEN   Squamous Epithelial / LPF 6-10 0 - 5    Comment: Performed at [facility], [address], [place], [place] [address]  Protime-INR     Status: None   Collection Time: 08/31/21  8:21 PM  Result Value Ref Range   Prothrombin Time 13.5 11.4 - 15.2 seconds   INR 1.0 0.8 - 1.2    Comment: (NOTE) INR goal varies based on device and disease states. Performed at [facility], [address] [place], [place] [address]   Bilirubin, fractionated(tot/dir/indir)     Status: Abnormal   Collection Time: 08/31/21  8:21 PM  Result Value Ref Range   Total Bilirubin 13.7 (H) 0.3 - 1.2 mg/dL   Bilirubin, Direct 8.6 (H) 0.0 - 0.2 mg/dL   Indirect Bilirubin 5.1 (H) 0.3 - 0.9 mg/dL    Comment: Performed at [facility], [address], [place], [place] [address]  Sedimentation rate     Status: Abnormal   Collection Time: 08/31/21  8:21 PM  Result Value Ref Range   Sed Rate 25 (H) 0 - 22 mm/hr    Comment: Performed at [facility], [address], [place], [place] [address]  Hepatitis panel, acute     Status: Abnormal (Preliminary result)   Collection Time: 08/31/21  8:21 PM  Result Value Ref Range   Hepatitis B Surface Ag PENDING NON REACTIVE   HCV Ab NON REACTIVE NON REACTIVE    Comment: (NOTE) Nonreactive HCV antibody screen is consistent with no HCV infections,  unless recent infection is suspected or other evidence exists to indicate HCV infection.     Hep A IgM NON  REACTIVE NON REACTIVE   Hep B C IgM Reactive (A) NON REACTIVE    Comment: Performed at [facility], [place] [address], [place], [place] [address]  hCG, quantitative, pregnancy     Status: None   Collection Time: 08/31/21  8:21 PM  Result Value Ref Range   hCG, Beta Chain, Quant, S <1 <5 mIU/mL    Comment:          GEST. AGE      CONC.  (mIU/mL)   <=1 WEEK        5 - 50     2 WEEKS       50 - 500     3 WEEKS       100 - 10,000     4 WEEKS     1,000 - 30,000     5 WEEKS     3,500 - 115,000   6-8 WEEKS     12,000 - 270,000    12 WEEKS     15,000 - 220,000        FEMALE AND NON-PREGNANT FEMALE:     LESS THAN 5 mIU/mL Performed at [facility], [address], [place], [place] [address]   Resp Panel by RT-PCR (Flu A&B, Covid) Nasopharyngeal Swab     Status: None   Collection Time: 08/31/21  8:25 PM   Specimen: Nasopharyngeal Swab; Nasopharyngeal(NP) swabs in vial transport medium  Result Value Ref Range  SARS Coronavirus 2 by RT PCR NEGATIVE NEGATIVE    Comment: (NOTE) SARS-CoV-2 target nucleic acids are NOT DETECTED.  The SARS-CoV-2 RNA is generally detectable in upper respiratory specimens during the acute phase of infection. The lowest concentration of SARS-CoV-2 viral copies this assay can detect is 138 copies/mL. A negative result does not preclude SARS-Cov-2 infection and should not be used as the sole basis for treatment or other patient management decisions. A negative result may occur with  improper specimen collection/handling, submission of specimen other than nasopharyngeal swab, presence of viral mutation(s) within the areas targeted by this assay, and inadequate number of viral copies(<138 copies/mL). A negative result must be combined with clinical observations, patient history, and epidemiological information. The expected result is Negative.  Fact Sheet for Patients:  EntrepreneurPulse.com.au  Fact Sheet for Healthcare Providers:   IncredibleEmployment.be  This test is no t yet approved or cleared by the [place] FDA and  has been authorized for detection and/or diagnosis of SARS-CoV-2 by FDA under an Emergency Use Authorization (EUA). This EUA will remain  in effect (meaning this test can be used) for the duration of the COVID-19 declaration under Section 564(b)(1) of the Act, 21 U.S.C.section 360bbb-3(b)(1), unless the authorization is terminated  or revoked sooner.       Influenza A by PCR NEGATIVE NEGATIVE   Influenza B by PCR NEGATIVE NEGATIVE    Comment: (NOTE) The Xpert Xpress SARS-CoV-2/FLU/RSV plus assay is intended as an aid in the diagnosis of influenza from Nasopharyngeal swab specimens and should not be used as a sole basis for treatment. Nasal washings and aspirates are unacceptable for Xpert Xpress SARS-CoV-2/FLU/RSV testing.  Fact Sheet for Patients: EntrepreneurPulse.com.au  Fact Sheet for Healthcare Providers: IncredibleEmployment.be  This test is not yet approved or cleared by the [place] FDA and has been authorized for detection and/or diagnosis of SARS-CoV-2 by FDA under an Emergency Use Authorization (EUA). This EUA will remain in effect (meaning this test can be used) for the duration of the COVID-19 declaration under Section 564(b)(1) of the Act, 21 U.S.C. section 360bbb-3(b)(1), unless the authorization is terminated or revoked.  Performed at [facility], [address] [place], [place] [address]   CBG monitoring, ED     Status: Abnormal   Collection Time: 09/01/21 12:06 AM  Result Value Ref Range   Glucose-Capillary 122 (H) 70 - 99 mg/dL    Comment: Glucose reference range applies only to samples taken after fasting for at least 8 hours.  Protime-INR     Status: None   Collection Time: 09/01/21 12:26 AM  Result Value Ref Range   Prothrombin Time 13.7 11.4 - 15.2 seconds   INR 1.1 0.8 - 1.2    Comment:  (NOTE) INR goal varies based on device and disease states. Performed at [facility], [address] [place], [place] [address]   Hepatic function panel     Status: Abnormal   Collection Time: 09/01/21 12:26 AM  Result Value Ref Range   Total Protein 7.1 6.5 - 8.1 g/dL   Albumin 3.6 3.5 - 5.0 g/dL   AST 1,103 (H) 15 - 41 U/L   ALT 1,402 (H) 0 - 44 U/L   Alkaline Phosphatase 119 38 - 126 U/L   Total Bilirubin 13.4 (H) 0.3 - 1.2 mg/dL   Bilirubin, Direct 8.5 (H) 0.0 - 0.2 mg/dL   Indirect Bilirubin 4.9 (H) 0.3 - 0.9 mg/dL    Comment: Performed at [facility], [address], [place],  [place] [address]  Hemoglobin A1c     Status: Abnormal   Collection Time: 09/01/21 12:26 AM  Result Value Ref Range   Hgb A1c MFr Bld 6.2 (H) 4.8 - 5.6 %    Comment: (NOTE) Pre diabetes:          5.7%-6.4%  Diabetes:              >6.4%  Glycemic control for   <7.0% adults with diabetes    Mean Plasma Glucose 131.24 mg/dL    Comment: Performed at [place] [address] [place], [place] [address]  HIV Antibody (routine testing w rflx)     Status: None   Collection Time: 09/01/21  4:31 AM  Result Value Ref Range   HIV Screen 4th Generation wRfx Non Reactive Non Reactive    Comment: Performed at [facility], [place] [address], [place], [place] [address]  Comprehensive metabolic panel     Status: Abnormal   Collection Time: 09/01/21  4:31 AM  Result Value Ref Range   Sodium 137 135 - 145 mmol/L   Potassium 3.7 3.5 - 5.1 mmol/L   Chloride 101 98 - 111 mmol/L   CO2 27 22 - 32 mmol/L   Glucose, Bld 125 (H) 70 - 99 mg/dL    Comment: Glucose reference range applies only to samples taken after fasting for at least 8 hours.   BUN 16 6 - 20 mg/dL   Creatinine, Ser 0.98 0.44 - 1.00 mg/dL   Calcium 9.0 8.9 - 10.3 mg/dL   Total Protein 6.8 6.5 - 8.1 g/dL   Albumin 3.4 (L) 3.5 - 5.0 g/dL   AST 1,083 (H) 15 - 41 U/L   ALT 1,446 (H) 0 - 44 U/L   Alkaline Phosphatase 116 38 - 126 U/L    Total Bilirubin 13.1 (H) 0.3 - 1.2 mg/dL   GFR, Estimated >60 >60 mL/min    Comment: (NOTE) Calculated using the CKD-EPI Creatinine Equation (2021)    Anion gap 9 5 - 15    Comment: Performed at [facility], [address], [place], [place] [address]  CBC     Status: Abnormal   Collection Time: 09/01/21  4:31 AM  Result Value Ref Range   WBC 5.5 4.0 - 10.5 K/uL   RBC 4.89 3.87 - 5.11 MIL/uL   Hemoglobin 13.6 12.0 - 15.0 g/dL   HCT 42.6 36.0 - 46.0 %   MCV 87.1 80.0 - 100.0 fL   MCH 27.8 26.0 - 34.0 pg   MCHC 31.9 30.0 - 36.0 g/dL   RDW 19.2 (H) 11.5 - 15.5 %   Platelets 150 150 - 400 K/uL   nRBC 0.0 0.0 - 0.2 %    Comment: Performed at [facility], [address], [place], [place] [address]  Protime-INR     Status: None   Collection Time: 09/01/21  4:31 AM  Result Value Ref Range   Prothrombin Time 13.6 11.4 - 15.2 seconds   INR 1.0 0.8 - 1.2    Comment: (NOTE) INR goal varies based on device and disease states. Performed at [facility], [address], [place], [place] [address]   APTT     Status: Abnormal   Collection Time: 09/01/21  4:31 AM  Result Value Ref Range   aPTT 41 (H) 24 - 36 seconds    Comment:        IF BASELINE aPTT IS ELEVATED, SUGGEST PATIENT RISK ASSESSMENT BE USED TO DETERMINE APPROPRIATE  ANTICOAGULANT THERAPY. Performed at [facility], [address], [place], [place] [address]   Magnesium     Status: None   Collection Time: 09/01/21  4:31 AM  Result Value Ref Range   Magnesium 2.3 1.7 - 2.4 mg/dL    Comment: Performed at [facility], [address], [place], [place] [address]  Phosphorus     Status: None   Collection Time: 09/01/21  4:31 AM  Result Value Ref Range   Phosphorus 3.9 2.5 - 4.6 mg/dL    Comment: Performed at [facility], [address] [place], [place] [address]  Lactate dehydrogenase     Status: Abnormal   Collection Time: 09/01/21  4:31 AM  Result Value Ref Range   LDH 509 (H) 98 - 192 U/L    Comment: Performed at [facility], [address] [place], [place] [address]  CBG monitoring, ED     Status: Abnormal   Collection Time: 09/01/21  7:52 AM  Result Value Ref Range   Glucose-Capillary 112 (H) 70 - 99 mg/dL    Comment: Glucose reference range applies only to samples taken after fasting for at least 8 hours.  CBG monitoring, ED     Status: Abnormal   Collection Time: 09/01/21 11:33 AM  Result Value Ref Range   Glucose-Capillary 120 (H) 70 - 99 mg/dL    Comment: Glucose reference range applies only to samples taken after fasting for at least 8 hours.   US Abdomen Limited RUQ (LIVER/GB)  Addendum Date: 09/01/2021   ADDENDUM REPORT: 09/01/2021 12:16 ADDENDUM: Dictation error occurred in the finding section of the report. 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 Electronically Signed   By: Dahlia Bailiff M.D.   On: 09/01/2021 12:16   Result Date: 09/01/2021 CLINICAL DATA:  Elevated LFTs EXAM: ULTRASOUND ABDOMEN LIMITED RIGHT UPPER QUADRANT COMPARISON:  CT November 21, 2010. FINDINGS: Gallbladder: No gallstones or wall thickening visualized. No sonographic Murphy sign noted by sonographer. Common bile duct: Diameter: 5 mm Liver: No focal lesion identified. Diffusely increased parenchymal echogenicity with decreased acoustic penetration. Portal vein is patent on color Doppler imaging with normal direction of blood flow towards the liver. Other: None. IMPRESSION: The echogenicity of the liver is increased. This is a nonspecific finding but is most commonly seen with fatty infiltration of the liver. There are no obvious focal liver lesions. Electronically Signed: By: Dahlia Bailiff M.D. On: 08/31/2021 18:27    Pending Labs Unresulted Labs (From admission, onward)     Start     Ordered   09/02/21 0500  Hepatitis B DNA, Ultraquantitative, PCR  Tomorrow morning,   R        09/01/21 1401   09/02/21 0500  Hepatitis B e antibody  Tomorrow morning,   R        09/01/21 1416   09/02/21 0500  Hepatitis B  e antigen  Tomorrow morning,   R        09/01/21 1416   09/01/21 1103  IgG, IgA, IgM  Once,   STAT        09/01/21 1102   09/01/21 0500  Haptoglobin  Tomorrow morning,   R        08/31/21 2343   08/31/21 2006  CMV IgM  Once,   STAT        08/31/21 2007   08/31/21 2006  HSV(herpes simplex vrs) 1+2 ab-IgM  Once,   STAT        08/31/21 2007  08/31/21 2005  Epstein-Barr virus VCA, IgM  Once,   STAT        08/31/21 2007   08/31/21 2004  Antinuclear Antibodies, IFA  Once,   STAT        08/31/21 2007            Vitals/Pain Today's Vitals   09/01/21 0904 09/01/21 1003 09/01/21 1349 09/01/21 1432  BP:   (!) 157/87   Pulse:   67   Resp:   18   Temp:   99.1 F (37.3 C)   TempSrc:   Oral   SpO2:  95% 94%   Weight:      Height:      PainSc: '7  5   5     '$ Isolation Precautions No active isolations  Medications Medications  heparin injection 5,000 Units (5,000 Units Subcutaneous Given 09/01/21 1345)  pantoprazole (PROTONIX) injection 40 mg (40 mg Intravenous Given 09/01/21 0007)  morphine 2 MG/ML injection 2 mg (2 mg Intravenous Given 09/01/21 1356)  ondansetron (ZOFRAN) injection 4 mg (4 mg Intravenous Given 09/01/21 0905)  amLODipine (NORVASC) tablet 5 mg (5 mg Oral Given 09/01/21 0910)  metoprolol succinate (TOPROL-XL) 24 hr tablet 50 mg (50 mg Oral Given 09/01/21 0910)  fluticasone furoate-vilanterol (BREO ELLIPTA) 100-25 MCG/INH 1 puff (1 puff Inhalation Given 09/01/21 1003)  insulin aspart (novoLOG) injection 0-9 Units (0 Units Subcutaneous Not Given 09/01/21 1137)  insulin aspart (novoLOG) injection 0-5 Units (0 Units Subcutaneous Not Given 09/01/21 0006)  losartan (COZAAR) tablet 50 mg (50 mg Oral Given 09/01/21 0909)  hydrochlorothiazide (MICROZIDE) capsule 12.5 mg (12.5 mg Oral Given 09/01/21 0909)  albuterol (PROVENTIL) (2.5 MG/3ML) 0.083% nebulizer solution 2.5 mg (has no administration in time range)  ondansetron (ZOFRAN) injection 4 mg (4 mg Intravenous Not Given 09/01/21 1130)  sodium  chloride 0.9 % bolus 500 mL (0 mLs Intravenous Stopped 08/31/21 2221)  diphenhydrAMINE (BENADRYL) injection 12.5 mg (12.5 mg Intravenous Given 08/31/21 2102)  metoCLOPramide (REGLAN) injection 10 mg (10 mg Intravenous Given 08/31/21 2102)    Mobility walks Low fall risk   Focused Assessments    R Recommendations: See Admitting Provider Note  Report given to:   Additional Notes:

## 2021-09-01 NOTE — Consult Note (Addendum)
Referring Provider: Cherene Altes, MD Primary Care Physician:  [facility], [facility] Gastroenterologist:  Garfield Cornea, MD  Reason for Consultation: elevated LFTs  HPI: Shelly Larson is a 50 y.o. 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  Patient reports that he developed sinus symptoms a couple of weeks ago.  Initially she took Sudafed as recommended by her PCP.  Symptoms progressed, she developed fever and completed telemedicine visit over the weekend. States she was started on Augmentin and prednisone for possible sinus infection.  Within 1 day she started having nausea.  She thought maybe it was just a reaction to the antibiotics.  Over the course of several days she continued to progress with significant nausea, which is constant.  Noted right upper quadrant/epigastric pain which has been persistent but more severe at certain times.  Sometimes worse with food.  She went to see her PCP who completed blood work.  PCP called her and asked her to go to the ED for abnormal LFTs yesterday. Patient states she took last Augmentin one week ago due to her symptoms. Patient states she noted eyes turning yellow, brown urine, clay colored stool over course of several days but feels like stool color has improved. She tried Pepto and Prevacid for her symptoms but no improvement. Chronically deals with constipation associated with abdominal pain but typically has 1-2 stools daily every day to every other day. No brbpr. No heartburn.   Patient states that she has been taking Advil dual action with 250 mg of Tylenol, taking 2 at a time about 3 times per day for 1 month for right shoulder pain. Denies any alcohol use.  No illicit drug use.  Monogamous relationship with same partner for 2  years.  In the ED: CBC unremarkable, AST 1036, ALT 1476, alk phos 124, total bilirubin 13.  Acetaminophen level less than 10, lipase 27, INR 1, repeat total bilirubin 13.7 with direct bilirubin 8.6.  SARS coronavirus negative, sed rate 25.  ANA, acute hepatitis panel, EBV, CMV IgM, HSV all pending.  LFTs normal in September 2019.  Right upper quadrant ultrasound: Echogenicity of the liver is increased, nonspecific finding but can be seen with fatty infiltration of the liver.  Common bile duct 5 mm.  "No gallstones or wall thickening. No sonographic Murphy sign". I have put in call to read room to have study reread and place addendum if needed. Patient reports her gallbladder was removed 2010 and subsequent CTs have reported prior cholecystectomy.  Today: AST 1103, ALT 1402, alk phos 119, total bilirubin 13.4, direct bilirubin 8.5, CBC unremarkable, INR 1.0, LDH 509, haptoglobin pending.   Colonoscopy 2019: - Hemorrhoids found on perianal exam. Grade III - One 5 mm polyp in the sigmoid colon, removed with a cold snare. Resected and retrieved. Status post segmental resection - External and internal hemorrhoids. - The examination was otherwise normal on direct and retroflexion views. -Path showed tubular adenoma.  Next colonoscopy in 5 years.  Prior to Admission medications   Medication Sig Start Date End Date Taking? Authorizing Provider  albuterol (VENTOLIN HFA) 108 (90 Base) MCG/ACT inhaler Inhale 2 puffs into the lungs every 4 (four) hours as needed for wheezing or shortness of breath.  10/17/17  Yes [provider]  amLODipine (NORVASC) 5 MG tablet TAKE 1 TABLET BY MOUTH EVERY  DAY Patient taking differently: Take 5 mg by mouth every morning. 05/10/18  Yes Herminio Commons, MD  amphetamine-dextroamphetamine (ADDERALL XR) 20 MG 24 hr capsule Take 40 mg by mouth daily. 05/03/18  Yes [provider]  amphetamine-dextroamphetamine (ADDERALL) 10 MG tablet Take 10 mg by mouth  See admin instructions. Take daily at 1:00PM.   Yes [provider]  cyclobenzaprine (FLEXERIL) 10 MG tablet Take 10 mg by mouth at bedtime as needed for muscle spasms.   Yes [provider]  fluticasone furoate-vilanterol (BREO ELLIPTA) 100-25 MCG/INH AEPB Inhale 1 puff into the lungs every morning.    Yes [provider]  losartan-hydrochlorothiazide (HYZAAR) 50-12.5 MG tablet Take 1 tablet by mouth every morning.    Yes [provider]  metFORMIN (GLUCOPHAGE) 500 MG tablet Take 1 tablet (500 mg total) by mouth 2 (two) times daily with a meal. 11/14/18  Yes Nida, Marella Chimes, MD  metoprolol succinate (TOPROL-XL) 50 MG 24 hr tablet Take 50 mg by mouth every morning.  09/19/17  Yes [provider]  nitroGLYCERIN (NITROSTAT) 0.4 MG SL tablet Place 0.4 mg under the tongue every 5 (five) minutes as needed.  08/21/17  Yes [provider]  OVER THE COUNTER MEDICATION Apply 1 application topically daily as needed (for pain hemmorrhoids). rectacare OTC    Yes [provider]  Topiramate ER (TROKENDI XR) 100 MG CP24 Take 1 capsule by mouth at bedtime.   Yes [provider]    Current Facility-Administered Medications  Medication Dose Route Frequency Provider Last Rate Last Admin   albuterol (PROVENTIL) (2.5 MG/3ML) 0.083% nebulizer solution 2.5 mg  2.5 mg Nebulization Q4H PRN Adefeso, Oladapo, DO       amLODipine (NORVASC) tablet 5 mg  5 mg Oral q morning Adefeso, Oladapo, DO       fluticasone furoate-vilanterol (BREO ELLIPTA) 100-25 MCG/INH 1 puff  1 puff Inhalation q morning Adefeso, Oladapo, DO       heparin injection 5,000 Units  5,000 Units Subcutaneous Q8H Adefeso, Oladapo, DO   5,000 Units at 09/01/21 0549   hydrochlorothiazide (MICROZIDE) capsule 12.5 mg  12.5 mg Oral Daily Adefeso, Oladapo, DO       insulin aspart (novoLOG) injection 0-5 Units  0-5 Units Subcutaneous QHS Adefeso, Oladapo, DO       insulin aspart (novoLOG)  injection 0-9 Units  0-9 Units Subcutaneous TID WC Adefeso, Oladapo, DO       losartan (COZAAR) tablet 50 mg  50 mg Oral Daily Adefeso, Oladapo, DO       metoprolol succinate (TOPROL-XL) 24 hr tablet 50 mg  50 mg Oral q morning Adefeso, Oladapo, DO       morphine 2 MG/ML injection 2 mg  2 mg Intravenous Q4H PRN Adefeso, Oladapo, DO   2 mg at 09/01/21 0427   ondansetron (ZOFRAN) injection 4 mg  4 mg Intravenous Q6H PRN Adefeso, Oladapo, DO   4 mg at 08/31/21 2309   pantoprazole (PROTONIX) injection 40 mg  40 mg Intravenous QHS Adefeso, Oladapo, DO   40 mg at 09/01/21 0007   Current Outpatient Medications  Medication Sig Dispense Refill   albuterol (VENTOLIN HFA) 108 (90 Base) MCG/ACT inhaler Inhale 2 puffs into the lungs every 4 (four) hours as needed for wheezing or shortness of breath.      amLODipine (NORVASC) 5 MG tablet TAKE 1 TABLET BY MOUTH EVERY DAY (Patient taking differently: Take 5 mg by mouth every morning.) 30 tablet 2   amphetamine-dextroamphetamine (  ADDERALL XR) 20 MG 24 hr capsule Take 40 mg by mouth daily.     amphetamine-dextroamphetamine (ADDERALL) 10 MG tablet Take 10 mg by mouth See admin instructions. Take daily at 1:00PM.     cyclobenzaprine (FLEXERIL) 10 MG tablet Take 10 mg by mouth at bedtime as needed for muscle spasms.     fluticasone furoate-vilanterol (BREO ELLIPTA) 100-25 MCG/INH AEPB Inhale 1 puff into the lungs every morning.      losartan-hydrochlorothiazide (HYZAAR) 50-12.5 MG tablet Take 1 tablet by mouth every morning.      metFORMIN (GLUCOPHAGE) 500 MG tablet Take 1 tablet (500 mg total) by mouth 2 (two) times daily with a meal. 180 tablet 1   metoprolol succinate (TOPROL-XL) 50 MG 24 hr tablet Take 50 mg by mouth every morning.   0   nitroGLYCERIN (NITROSTAT) 0.4 MG SL tablet Place 0.4 mg under the tongue every 5 (five) minutes as needed.      OVER THE COUNTER MEDICATION Apply 1 application topically daily as needed (for pain hemmorrhoids). rectacare OTC       Topiramate ER (TROKENDI XR) 100 MG CP24 Take 1 capsule by mouth at bedtime.      Allergies as of 08/31/2021 - Review Complete 08/31/2021  Allergen Reaction Noted   Lisinopril Cough 05/08/2018    Past Medical History:  Diagnosis Date   ADHD    Anemia    Arthritis    Biliary acute pancreatitis 2010   Colon cancer ([place]) 2016   Transverse colon    Diabetes mellitus without complication ([place])    Dyspnea    Hypertension    Migraine     Past Surgical History:  Procedure Laterality Date   BAND HEMORRHOIDECTOMY     CHOLECYSTECTOMY  2010   [place], biliary pancreatitis   COLECTOMY  09/30/2015   distal transverse d/t adenocarcinoma   COLONOSCOPY N/A 06/11/2018   Procedure: COLONOSCOPY;  Surgeon: Daneil Dolin, MD;  Location: [facility];  Service: Endoscopy;  Laterality: N/A;  1:45pm   DILITATION & CURRETTAGE/HYSTROSCOPY WITH NOVASURE ABLATION N/A 09/10/2018   Procedure: DILATATION & CURETTAGE/HYSTEROSCOPY WITH MINERVA  ENDOMETRIAL ABLATION;  Surgeon: Jonnie Kind, MD;  Location: [facility];  Service: Gynecology;  Laterality: N/A;   POLYPECTOMY  06/11/2018   Procedure: POLYPECTOMY;  Surgeon: Daneil Dolin, MD;  Location: [facility];  Service: Endoscopy;;  colon    Family History  Problem Relation Age of Onset   Hypertension Mother    Diabetes Mellitus II Mother    Heart disease Sister        dx in late 23's   Breast cancer Paternal Grandmother    Breast cancer Paternal Aunt    Colon cancer Neg Hx    Liver disease Neg Hx     Social History   Socioeconomic History   Marital status: Single    Spouse name: Not on file   Number of children: Not on file   Years of education: Not on file   Highest education level: Not on file  Occupational History   Occupation: Public affairs consultant  Tobacco Use   Smoking status: Never   Smokeless tobacco: Never  Vaping Use   Vaping Use: Never used  Substance and Sexual Activity   Alcohol use: Yes    Comment: rare   Drug use: No    Sexual activity: Yes    Birth control/protection: None, Surgical    Comment: ablation  Other Topics Concern   Not on file  Social History  Narrative   Not on file   Social Determinants of Health   Financial Resource Strain: Not on file  Food Insecurity: Not on file  Transportation Needs: Not on file  Physical Activity: Not on file  Stress: Not on file  Social Connections: Not on file  Intimate Partner Violence: Not on file     ROS:  General: Negative for weight loss, chills. +weakness. Poor appetite. See hpi. Eyes: Negative for vision changes.  ENT: Negative for hoarseness, difficulty swallowing , nasal congestion. CV: Negative for chest pain, angina, palpitations, dyspnea on exertion, peripheral edema.  Respiratory: Negative for dyspnea at rest, dyspnea on exertion, cough, sputum, wheezing.  GI: See history of present illness. GU:  Negative for dysuria, hematuria, urinary incontinence, urinary frequency, nocturnal urination.  MS: Negative for joint pain, low back pain.  Derm: Negative for rash or itching.  Neuro: Negative for weakness, abnormal sensation, seizure, frequent headaches, memory loss, confusion.  Psych: Negative for anxiety, depression, suicidal ideation, hallucinations.  Endo: Negative for unusual weight change.  Heme: Negative for bruising or bleeding. Allergy: Negative for rash or hives.       Physical Examination: Vital signs in last 24 hours: Temp:  [98.3 F (36.8 C)-98.4 F (36.9 C)] 98.3 F (36.8 C) (09/07 2056) Pulse Rate:  [73-79] 73 (09/08 0345) Resp:  [12-36] 18 (09/08 0345) BP: (154-206)/(86-105) 165/96 (09/08 0345) SpO2:  [91 %-98 %] 94 % (09/08 0345) Weight:  [122.5 kg] 122.5 kg (09/07 1405)    General: appears uncomfortable. Well-nourished, well-developed in no acute distress.  Head: Normocephalic, atraumatic.   Eyes: Conjunctiva pink, + icterus. Mouth: Oropharyngeal mucosa moist and pink , no lesions erythema or exudate. Neck: Supple  without thyromegaly, masses, or lymphadenopathy.  Lungs: Clear to auscultation bilaterally.  Heart: Regular rate and rhythm, no murmurs rubs or gallops.  Abdomen: Bowel sounds are normal, nondistended, no hepatosplenomegaly or masses, no abdominal bruits or    hernia , no rebound or guarding.  Obese. Tenderness in ruq/epigastric region.  Rectal: not performed Extremities: No lower extremity edema, clubbing, deformity.  Neuro: Alert and oriented x 4 , grossly normal neurologically.  Skin: Warm and dry, no rash or jaundice.   Psych: Alert and cooperative, normal mood and affect.        Intake/Output from previous day: 09/07 0701 - 09/08 0700 In: 500 [IV Piggyback:500] Out: -  Intake/Output this shift: No intake/output data recorded.  Lab Results: CBC Recent Labs    08/31/21 1440 09/01/21 0431  WBC 4.9 5.5  HGB 14.7 13.6  HCT 45.0 42.6  MCV 86.9 87.1  PLT 160 150   BMET Recent Labs    08/31/21 1440 09/01/21 0431  NA 137 137  K 3.5 3.7  CL 101 101  CO2 28 27  GLUCOSE 113* 125*  BUN 15 16  CREATININE 1.04* 0.98  CALCIUM 9.3 9.0   LFT Recent Labs    08/31/21 1440 08/31/21 2021 09/01/21 0026 09/01/21 0431  BILITOT 13.0* 13.7* 13.4* 13.1*  BILIDIR  --  8.6* 8.5*  --   IBILI  --  5.1* 4.9*  --   ALKPHOS 124  --  119 116  AST 1,036*  --  1,103* 1,083*  ALT 1,476*  --  1,402* 1,446*  PROT 7.5  --  7.1 6.8  ALBUMIN 3.8  --  3.6 3.4*    Lipase Recent Labs    08/31/21 1442  LIPASE 27    PT/INR Recent Labs    08/31/21 2021 09/01/21  0026 09/01/21 0431  LABPROT 13.5 13.7 13.6  INR 1.0 1.1 1.0      Imaging Studies: US Abdomen Limited RUQ (LIVER/GB)  Result Date: 08/31/2021 CLINICAL DATA:  Elevated LFTs EXAM: ULTRASOUND ABDOMEN LIMITED RIGHT UPPER QUADRANT COMPARISON:  CT November 21, 2010. FINDINGS: Gallbladder: No gallstones or wall thickening visualized. No sonographic Murphy sign noted by sonographer. Common bile duct: Diameter: 5 mm Liver: No focal  lesion identified. Diffusely increased parenchymal echogenicity with decreased acoustic penetration. Portal vein is patent on color Doppler imaging with normal direction of blood flow towards the liver. Other: None. IMPRESSION: The echogenicity of the liver is increased. This is a nonspecific finding but is most commonly seen with fatty infiltration of the liver. There are no obvious focal liver lesions. Electronically Signed   By: Dahlia Bailiff M.D.   On: 08/31/2021 18:27  [4 week]   Impression: Very pleasant 50 year old female with history of colon cancer status post segmental resection in 2016, diabetes mellitus, hypertension, migraines, remote biliary pancreatitis status postcholecystectomy in 2010 presenting for markedly abnormal LFTs.    Abnormal LFTs: Associated with right upper quadrant/epigastric pain, nausea, diminished oral intake.  Symptoms began couple weeks ago when she developed sinusitis, initially treated with Sudafed.  Due to progressive symptoms and fever, she was started on Augmentin and prednisone.  Patient noted nausea, felt poorly and ultimately stopped taking the medication 1 week ago.  On September 1, noted epigastric/right upper quadrant pain, worsening constant nausea.  PCP arranged for blood work and ultimately called the patient yesterday and requested she go to the ED for markedly abnormal LFTs.  Right upper quadrant ultrasound showed fatty liver.  Gallbladder reported to be normal but she reports prior cholecystectomy.  I have contacted radiology reading room to ask radiologist to reread the study and make addendum if appropriate.  Total bilirubin initially 13-->13.1, direct bilirubin 8.6-->8.5, alkaline phosphatase has been normal, AST 1036 -->1083, ALT 1476-->1446.  Acetaminophen level less than 10, patient denies significant acetaminophen use.  Serologies pending including ANA, acute hepatitis panel, EBV IgM, CMV IgM, HSV IgM.  LDH elevated, haptoglobin pending. INR  normal.   Query acute hepatitis related to Augmentin. Acute viral hepatitis and hemolysis to be excluded. RUQ/epigastric pain and nausea may be due to acute hepatitis but need to consider microlithiasis/cholelithiasis therefore may need MRCP. RUQ u/s to be reread as patient notes prior cholecystectomy.   Plan: F/u pending labs. IgG/IgM/IgA. Supportive measures. Scheduled zofran for 8 doses. Consider MRCP.   We would like to thank you for the opportunity to participate in the care of Shelly Larson.  Laureen Ochs. Bernarda Caffey [facility] [phone] 9/8/202211:02 AM   LOS: 0 days

## 2021-09-01 NOTE — H&P (View-Only) (Signed)
Referring Provider: Cherene Altes, MD Primary Care Physician:  [facility] Primary Gastroenterologist:  Garfield Cornea, MD  Reason for Consultation: elevated LFTs  HPI: Shelly Larson is a 50 y.o. 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  Patient reports that he developed sinus symptoms a couple of weeks ago.  Initially she took Sudafed as recommended by her PCP.  Symptoms progressed, she developed fever and completed telemedicine visit over the weekend. States she was started on Augmentin and prednisone for possible sinus infection.  Within 1 day she started having nausea.  She thought maybe it was just a reaction to the antibiotics.  Over the course of several days she continued to progress with significant nausea, which is constant.  Noted right upper quadrant/epigastric pain which has been persistent but more severe at certain times.  Sometimes worse with food.  She went to see her PCP who completed blood work.  PCP called her and asked her to go to the ED for abnormal LFTs yesterday. Patient states she took last Augmentin one week ago due to her symptoms. Patient states she noted eyes turning yellow, brown urine, clay colored stool over course of several days but feels like stool color has improved. She tried Pepto and Prevacid for her symptoms but no improvement. Chronically deals with constipation associated with abdominal pain but typically has 1-2 stools daily every day to every other day. No brbpr. No heartburn.   Patient states that she has been taking Advil dual action with 250 mg of Tylenol, taking 2 at a time about 3 times per day for 1 month for right shoulder pain. Denies any alcohol use.  No illicit drug use.  Monogamous relationship with same partner for 2  years.  In the ED: CBC unremarkable, AST 1036, ALT 1476, alk phos 124, total bilirubin 13.  Acetaminophen level less than 10, lipase 27, INR 1, repeat total bilirubin 13.7 with direct bilirubin 8.6.  SARS coronavirus negative, sed rate 25.  ANA, acute hepatitis panel, EBV, CMV IgM, HSV all pending.  LFTs normal in September 2019.  Right upper quadrant ultrasound: Echogenicity of the liver is increased, nonspecific finding but can be seen with fatty infiltration of the liver.  Common bile duct 5 mm.  "No gallstones or wall thickening. No sonographic Murphy sign". I have put in call to read room to have study reread and place addendum if needed. Patient reports her gallbladder was removed 2010 and subsequent CTs have reported prior cholecystectomy.  Today: AST 1103, ALT 1402, alk phos 119, total bilirubin 13.4, direct bilirubin 8.5, CBC unremarkable, INR 1.0, LDH 509, haptoglobin pending.   Colonoscopy 2019: - Hemorrhoids found on perianal exam. Grade III - One 5 mm polyp in the sigmoid colon, removed with a cold snare. Resected and retrieved. Status post segmental resection - External and internal hemorrhoids. - The examination was otherwise normal on direct and retroflexion views. -Path showed tubular adenoma.  Next colonoscopy in 5 years.  Prior to Admission medications   Medication Sig Start Date End Date Taking? Authorizing Provider  albuterol (VENTOLIN HFA) 108 (90 Base) MCG/ACT inhaler Inhale 2 puffs into the lungs every 4 (four) hours as needed for wheezing or shortness of breath.  10/17/17  Yes [provider]  amLODipine (NORVASC) 5 MG tablet TAKE 1 TABLET BY MOUTH EVERY  DAY Patient taking differently: Take 5 mg by mouth every morning. 05/10/18  Yes Herminio Commons, MD  amphetamine-dextroamphetamine (ADDERALL XR) 20 MG 24 hr capsule Take 40 mg by mouth daily. 05/03/18  Yes [provider]  amphetamine-dextroamphetamine (ADDERALL) 10 MG tablet Take 10 mg by mouth  See admin instructions. Take daily at 1:00PM.   Yes [provider]  cyclobenzaprine (FLEXERIL) 10 MG tablet Take 10 mg by mouth at bedtime as needed for muscle spasms.   Yes [provider]  fluticasone furoate-vilanterol (BREO ELLIPTA) 100-25 MCG/INH AEPB Inhale 1 puff into the lungs every morning.    Yes [provider]  losartan-hydrochlorothiazide (HYZAAR) 50-12.5 MG tablet Take 1 tablet by mouth every morning.    Yes [provider]  metFORMIN (GLUCOPHAGE) 500 MG tablet Take 1 tablet (500 mg total) by mouth 2 (two) times daily with a meal. 11/14/18  Yes Nida, Marella Chimes, MD  metoprolol succinate (TOPROL-XL) 50 MG 24 hr tablet Take 50 mg by mouth every morning.  09/19/17  Yes [provider]  nitroGLYCERIN (NITROSTAT) 0.4 MG SL tablet Place 0.4 mg under the tongue every 5 (five) minutes as needed.  08/21/17  Yes [provider]  OVER THE COUNTER MEDICATION Apply 1 application topically daily as needed (for pain hemmorrhoids). rectacare OTC    Yes [provider]  Topiramate ER (TROKENDI XR) 100 MG CP24 Take 1 capsule by mouth at bedtime.   Yes [provider]    Current Facility-Administered Medications  Medication Dose Route Frequency Provider Last Rate Last Admin   albuterol (PROVENTIL) (2.5 MG/3ML) 0.083% nebulizer solution 2.5 mg  2.5 mg Nebulization Q4H PRN Adefeso, Oladapo, DO       amLODipine (NORVASC) tablet 5 mg  5 mg Oral q morning Adefeso, Oladapo, DO       fluticasone furoate-vilanterol (BREO ELLIPTA) 100-25 MCG/INH 1 puff  1 puff Inhalation q morning Adefeso, Oladapo, DO       heparin injection 5,000 Units  5,000 Units Subcutaneous Q8H Adefeso, Oladapo, DO   5,000 Units at 09/01/21 0549   hydrochlorothiazide (MICROZIDE) capsule 12.5 mg  12.5 mg Oral Daily Adefeso, Oladapo, DO       insulin aspart (novoLOG) injection 0-5 Units  0-5 Units Subcutaneous QHS Adefeso, Oladapo, DO       insulin aspart (novoLOG)  injection 0-9 Units  0-9 Units Subcutaneous TID WC Adefeso, Oladapo, DO       losartan (COZAAR) tablet 50 mg  50 mg Oral Daily Adefeso, Oladapo, DO       metoprolol succinate (TOPROL-XL) 24 hr tablet 50 mg  50 mg Oral q morning Adefeso, Oladapo, DO       morphine 2 MG/ML injection 2 mg  2 mg Intravenous Q4H PRN Adefeso, Oladapo, DO   2 mg at 09/01/21 0427   ondansetron (ZOFRAN) injection 4 mg  4 mg Intravenous Q6H PRN Adefeso, Oladapo, DO   4 mg at 08/31/21 2309   pantoprazole (PROTONIX) injection 40 mg  40 mg Intravenous QHS Adefeso, Oladapo, DO   40 mg at 09/01/21 0007   Current Outpatient Medications  Medication Sig Dispense Refill   albuterol (VENTOLIN HFA) 108 (90 Base) MCG/ACT inhaler Inhale 2 puffs into the lungs every 4 (four) hours as needed for wheezing or shortness of breath.      amLODipine (NORVASC) 5 MG tablet TAKE 1 TABLET BY MOUTH EVERY DAY (Patient taking differently: Take 5 mg by mouth every morning.) 30 tablet 2   amphetamine-dextroamphetamine (  ADDERALL XR) 20 MG 24 hr capsule Take 40 mg by mouth daily.     amphetamine-dextroamphetamine (ADDERALL) 10 MG tablet Take 10 mg by mouth See admin instructions. Take daily at 1:00PM.     cyclobenzaprine (FLEXERIL) 10 MG tablet Take 10 mg by mouth at bedtime as needed for muscle spasms.     fluticasone furoate-vilanterol (BREO ELLIPTA) 100-25 MCG/INH AEPB Inhale 1 puff into the lungs every morning.      losartan-hydrochlorothiazide (HYZAAR) 50-12.5 MG tablet Take 1 tablet by mouth every morning.      metFORMIN (GLUCOPHAGE) 500 MG tablet Take 1 tablet (500 mg total) by mouth 2 (two) times daily with a meal. 180 tablet 1   metoprolol succinate (TOPROL-XL) 50 MG 24 hr tablet Take 50 mg by mouth every morning.   0   nitroGLYCERIN (NITROSTAT) 0.4 MG SL tablet Place 0.4 mg under the tongue every 5 (five) minutes as needed.      OVER THE COUNTER MEDICATION Apply 1 application topically daily as needed (for pain hemmorrhoids). rectacare OTC       Topiramate ER (TROKENDI XR) 100 MG CP24 Take 1 capsule by mouth at bedtime.      Allergies as of 08/31/2021 - Review Complete 08/31/2021  Allergen Reaction Noted   Lisinopril Cough 05/08/2018    Past Medical History:  Diagnosis Date   ADHD    Anemia    Arthritis    Biliary acute pancreatitis 2010   Colon cancer ([place]) 2016   Transverse colon    Diabetes mellitus without complication ([place])    Dyspnea    Hypertension    Migraine     Past Surgical History:  Procedure Laterality Date   BAND HEMORRHOIDECTOMY     CHOLECYSTECTOMY  2010   [place], biliary pancreatitis   COLECTOMY  09/30/2015   distal transverse d/t adenocarcinoma   COLONOSCOPY N/A 06/11/2018   Procedure: COLONOSCOPY;  Surgeon: Daneil Dolin, MD;  Location: [facility];  Service: Endoscopy;  Laterality: N/A;  1:45pm   DILITATION & CURRETTAGE/HYSTROSCOPY WITH NOVASURE ABLATION N/A 09/10/2018   Procedure: DILATATION & CURETTAGE/HYSTEROSCOPY WITH MINERVA  ENDOMETRIAL ABLATION;  Surgeon: Jonnie Kind, MD;  Location: [facility] OR[facility];  Service: Gynecology;  Laterality: N/A;   POLYPECTOMY  06/11/2018   Procedure: POLYPECTOMY;  Surgeon: Daneil Dolin, MD;  Location: [facility];  Service: Endoscopy;;  colon    Family History  Problem Relation Age of Onset   Hypertension Mother    Diabetes Mellitus II Mother    Heart disease Sister        dx in late 23's   Breast cancer Paternal Grandmother    Breast cancer Paternal Aunt    Colon cancer Neg Hx    Liver disease Neg Hx     Social History   Socioeconomic History   Marital status: Single    Spouse name: Not on file   Number of children: Not on file   Years of education: Not on file   Highest education level: Not on file  Occupational History   Occupation: Public affairs consultant  Tobacco Use   Smoking status: Never   Smokeless tobacco: Never  Vaping Use   Vaping Use: Never used  Substance and Sexual Activity   Alcohol use: Yes    Comment: rare   Drug use: No    Sexual activity: Yes    Birth control/protection: None, Surgical    Comment: ablation  Other Topics Concern   Not on file  Social History  Narrative   Not on file   Social Determinants of Health   Financial Resource Strain: Not on file  Food Insecurity: Not on file  Transportation Needs: Not on file  Physical Activity: Not on file  Stress: Not on file  Social Connections: Not on file  Intimate Partner Violence: Not on file     ROS:  General: Negative for weight loss, chills. +weakness. Poor appetite. See hpi. Eyes: Negative for vision changes.  ENT: Negative for hoarseness, difficulty swallowing , nasal congestion. CV: Negative for chest pain, angina, palpitations, dyspnea on exertion, peripheral edema.  Respiratory: Negative for dyspnea at rest, dyspnea on exertion, cough, sputum, wheezing.  GI: See history of present illness. GU:  Negative for dysuria, hematuria, urinary incontinence, urinary frequency, nocturnal urination.  MS: Negative for joint pain, low back pain.  Derm: Negative for rash or itching.  Neuro: Negative for weakness, abnormal sensation, seizure, frequent headaches, memory loss, confusion.  Psych: Negative for anxiety, depression, suicidal ideation, hallucinations.  Endo: Negative for unusual weight change.  Heme: Negative for bruising or bleeding. Allergy: Negative for rash or hives.       Physical Examination: Vital signs in last 24 hours: Temp:  [98.3 F (36.8 C)-98.4 F (36.9 C)] 98.3 F (36.8 C) (09/07 2056) Pulse Rate:  [73-79] 73 (09/08 0345) Resp:  [12-36] 18 (09/08 0345) BP: (154-206)/(86-105) 165/96 (09/08 0345) SpO2:  [91 %-98 %] 94 % (09/08 0345) Weight:  [122.5 kg] 122.5 kg (09/07 1405)    General: appears uncomfortable. Well-nourished, well-developed in no acute distress.  Head: Normocephalic, atraumatic.   Eyes: Conjunctiva pink, + icterus. Mouth: Oropharyngeal mucosa moist and pink , no lesions erythema or exudate. Neck: Supple  without thyromegaly, masses, or lymphadenopathy.  Lungs: Clear to auscultation bilaterally.  Heart: Regular rate and rhythm, no murmurs rubs or gallops.  Abdomen: Bowel sounds are normal, nondistended, no hepatosplenomegaly or masses, no abdominal bruits or    hernia , no rebound or guarding.  Obese. Tenderness in ruq/epigastric region.  Rectal: not performed Extremities: No lower extremity edema, clubbing, deformity.  Neuro: Alert and oriented x 4 , grossly normal neurologically.  Skin: Warm and dry, no rash or jaundice.   Psych: Alert and cooperative, normal mood and affect.        Intake/Output from previous day: 09/07 0701 - 09/08 0700 In: 500 [IV Piggyback:500] Out: -  Intake/Output this shift: No intake/output data recorded.  Lab Results: CBC Recent Labs    08/31/21 1440 09/01/21 0431  WBC 4.9 5.5  HGB 14.7 13.6  HCT 45.0 42.6  MCV 86.9 87.1  PLT 160 150   BMET Recent Labs    08/31/21 1440 09/01/21 0431  NA 137 137  K 3.5 3.7  CL 101 101  CO2 28 27  GLUCOSE 113* 125*  BUN 15 16  CREATININE 1.04* 0.98  CALCIUM 9.3 9.0   LFT Recent Labs    08/31/21 1440 08/31/21 2021 09/01/21 0026 09/01/21 0431  BILITOT 13.0* 13.7* 13.4* 13.1*  BILIDIR  --  8.6* 8.5*  --   IBILI  --  5.1* 4.9*  --   ALKPHOS 124  --  119 116  AST 1,036*  --  1,103* 1,083*  ALT 1,476*  --  1,402* 1,446*  PROT 7.5  --  7.1 6.8  ALBUMIN 3.8  --  3.6 3.4*    Lipase Recent Labs    08/31/21 1442  LIPASE 27    PT/INR Recent Labs    08/31/21 2021 09/01/21  0026 09/01/21 0431  LABPROT 13.5 13.7 13.6  INR 1.0 1.1 1.0      Imaging Studies: US Abdomen Limited RUQ (LIVER/GB)  Result Date: 08/31/2021 CLINICAL DATA:  Elevated LFTs EXAM: ULTRASOUND ABDOMEN LIMITED RIGHT UPPER QUADRANT COMPARISON:  CT November 21, 2010. FINDINGS: Gallbladder: No gallstones or wall thickening visualized. No sonographic Murphy sign noted by sonographer. Common bile duct: Diameter: 5 mm Liver: No focal  lesion identified. Diffusely increased parenchymal echogenicity with decreased acoustic penetration. Portal vein is patent on color Doppler imaging with normal direction of blood flow towards the liver. Other: None. IMPRESSION: The echogenicity of the liver is increased. This is a nonspecific finding but is most commonly seen with fatty infiltration of the liver. There are no obvious focal liver lesions. Electronically Signed   By: Dahlia Bailiff M.D.   On: 08/31/2021 18:27  [4 week]   Impression: Very pleasant 50 year old female with history of colon cancer status post segmental resection in 2016, diabetes mellitus, hypertension, migraines, remote biliary pancreatitis status postcholecystectomy in 2010 presenting for markedly abnormal LFTs.    Abnormal LFTs: Associated with right upper quadrant/epigastric pain, nausea, diminished oral intake.  Symptoms began couple weeks ago when she developed sinusitis, initially treated with Sudafed.  Due to progressive symptoms and fever, she was started on Augmentin and prednisone.  Patient noted nausea, felt poorly and ultimately stopped taking the medication 1 week ago.  On September 1, noted epigastric/right upper quadrant pain, worsening constant nausea.  PCP arranged for blood work and ultimately called the patient yesterday and requested she go to the ED for markedly abnormal LFTs.  Right upper quadrant ultrasound showed fatty liver.  Gallbladder reported to be normal but she reports prior cholecystectomy.  I have contacted radiology reading room to ask radiologist to reread the study and make addendum if appropriate.  Total bilirubin initially 13-->13.1, direct bilirubin 8.6-->8.5, alkaline phosphatase has been normal, AST 1036 -->1083, ALT 1476-->1446.  Acetaminophen level less than 10, patient denies significant acetaminophen use.  Serologies pending including ANA, acute hepatitis panel, EBV IgM, CMV IgM, HSV IgM.  LDH elevated, haptoglobin pending. INR  normal.   Query acute hepatitis related to Augmentin. Acute viral hepatitis and hemolysis to be excluded. RUQ/epigastric pain and nausea may be due to acute hepatitis but need to consider microlithiasis/cholelithiasis therefore may need MRCP. RUQ u/s to be reread as patient notes prior cholecystectomy.   Plan: F/u pending labs. IgG/IgM/IgA. Supportive measures. Scheduled zofran for 8 doses. Consider MRCP.   We would like to thank you for the opportunity to participate in the care of [name].  Laureen Ochs. Bernarda Caffey [facility] [phone] 9/8/202211:02 AM   LOS: 0 days

## 2021-09-01 NOTE — Progress Notes (Signed)
Shelly Larson  [id number] DOB: [birth year] DOA: 08/31/2021 PCP: [facility]    Brief Narrative:  50 year old with a history of hypertension, asthma, colon cancer status post segmental resection 2016, biliary pancreatitis status post cholecystectomy 2010, and DM2 who was told to present to the ED because of abnormal liver enzymes.  She was being evaluated for abdominal pain with nagging nausea by her PCP.  This included blood work which revealed elevated LFTs.  In the ED AST was noted to be 1036, and ALT was noted to be 1476.  Total bilirubin was 13.  RUQ ultrasound noted increased echogenicity of the liver consistent with fatty infiltrate but no other obvious lesions.  Consultants:  None  Code Status: FULL CODE  Antimicrobials:  None  DVT prophylaxis: Subcutaneous heparin  Subjective: No shortness of breath or chest pain at the time of my evaluation.  Reports ongoing gnawing epigastric abdominal discomfort with nagging refractory nausea.  Assessment & Plan:  Transaminitis / Elevated Tbil - Acute idiopathic hepatitis Patient admitted to taking Tylenol every 4-6 hours for approximately 1 month -viral hepatitis panel pending - GI to follow in consult  Recent Labs  Lab 08/31/21 1440 08/31/21 2021 09/01/21 0026 09/01/21 0431  AST 1,036*  --  1,103* 1,083*  ALT 1,476*  --  1,402* 1,446*  ALKPHOS 124  --  119 116  BILITOT 13.0* 13.7* 13.4* 13.1*  PROT 7.5  --  7.1 6.8  ALBUMIN 3.8  --  3.6 3.4*    HTN Continue amlodipine, Hyzaar, and Toprol  Asthma Well compensated at present -continue usual home inhaled medications  DM2 A1c 6.2   Family Communication: No family present at time of exam Status is: Inpatient  Remains inpatient appropriate because:Inpatient level of care appropriate due to severity of illness  Dispo: The patient is from: Home              Anticipated d/c is to: Home              Patient currently is not medically stable to d/c.    Difficult to place patient No   Objective: Blood pressure (!) 165/96, pulse 73, temperature 98.3 F (36.8 C), temperature source Oral, resp. rate 18, height 6' (1.829 m), weight 122.5 kg, SpO2 95 %.  Intake/Output Summary (Last 24 hours) at 09/01/2021 1051 Last data filed at 08/31/2021 2221 Gross per 24 hour  Intake 500 ml  Output --  Net 500 ml   Filed Weights   08/31/21 1405  Weight: 122.5 kg    Examination: General: No acute respiratory distress Lungs: Clear to auscultation bilaterally without wheezes or crackles Cardiovascular: Regular rate and rhythm without murmur gallop or rub normal S1 and S2 Abdomen: Tenderness to palpation across epigastrium, soft, bowel sounds positive, no mass, no rebound, not distended Extremities: No significant cyanosis, clubbing, or edema bilateral lower extremities  CBC: Recent Labs  Lab 08/31/21 1440 09/01/21 0431  WBC 4.9 5.5  NEUTROABS 3.0  --   HGB 14.7 13.6  HCT 45.0 42.6  MCV 86.9 87.1  PLT 160 [id number]   Basic Metabolic Panel: Recent Labs  Lab 08/31/21 1440 09/01/21 0431  NA 137 137  K 3.5 3.7  CL 101 101  CO2 28 27  GLUCOSE 113* 125*  BUN 15 16  CREATININE 1.04* 0.98  CALCIUM 9.3 9.0  MG  --  2.3  PHOS  --  3.9   GFR: Estimated Creatinine Clearance: 104.1 mL/min (by C-G formula based on SCr of  0.98 mg/dL).  Liver Function Tests: Recent Labs  Lab 08/31/21 1440 08/31/21 2021 09/01/21 0026 09/01/21 0431  AST 1,036*  --  1,103* 1,083*  ALT 1,476*  --  1,402* 1,446*  ALKPHOS 124  --  119 116  BILITOT 13.0* 13.7* 13.4* 13.1*  PROT 7.5  --  7.1 6.8  ALBUMIN 3.8  --  3.6 3.4*   Recent Labs  Lab 08/31/21 1442  LIPASE 27    Coagulation Profile: Recent Labs  Lab 08/31/21 2021 09/01/21 0026 09/01/21 0431  INR 1.0 1.1 1.0     HbA1C: Hemoglobin A1C  Date/Time Value Ref Range Status  05/03/2018 12:00 AM 6.1  Final   Hgb A1c MFr Bld  Date/Time Value Ref Range Status  09/01/2021 12:26 AM 6.2 (H) 4.8 - 5.6 %  Final    Comment:    (NOTE) Pre diabetes:          5.7%-6.4%  Diabetes:              >6.4%  Glycemic control for   <7.0% adults with diabetes   09/04/2018 04:00 PM 6.2 (H) 4.8 - 5.6 % Final    Comment:    (NOTE) Pre diabetes:          5.7%-6.4% Diabetes:              >6.4% Glycemic control for   <7.0% adults with diabetes     CBG: Recent Labs  Lab 09/01/21 0006 09/01/21 0752  GLUCAP 122* 112*    Recent Results (from the past 240 hour(s))  Resp Panel by RT-PCR (Flu A&B, Covid) Nasopharyngeal Swab     Status: None   Collection Time: 08/31/21  8:25 PM   Specimen: Nasopharyngeal Swab; Nasopharyngeal(NP) swabs in vial transport medium  Result Value Ref Range Status   SARS Coronavirus 2 by RT PCR NEGATIVE NEGATIVE Final    Comment: (NOTE) SARS-CoV-2 target nucleic acids are NOT DETECTED.  The SARS-CoV-2 RNA is generally detectable in upper respiratory specimens during the acute phase of infection. The lowest concentration of SARS-CoV-2 viral copies this assay can detect is 138 copies/mL. A negative result does not preclude SARS-Cov-2 infection and should not be used as the sole basis for treatment or other patient management decisions. A negative result may occur with  improper specimen collection/handling, submission of specimen other than nasopharyngeal swab, presence of viral mutation(s) within the areas targeted by this assay, and inadequate number of viral copies(<138 copies/mL). A negative result must be combined with clinical observations, patient history, and epidemiological information. The expected result is Negative.  Fact Sheet for Patients:  EntrepreneurPulse.com.au  Fact Sheet for Healthcare Providers:  IncredibleEmployment.be  This test is no t yet approved or cleared by the [place] FDA and  has been authorized for detection and/or diagnosis of SARS-CoV-2 by FDA under an Emergency Use Authorization (EUA). This  EUA will remain  in effect (meaning this test can be used) for the duration of the COVID-19 declaration under Section 564(b)(1) of the Act, 21 U.S.C.section 360bbb-3(b)(1), unless the authorization is terminated  or revoked sooner.       Influenza A by PCR NEGATIVE NEGATIVE Final   Influenza B by PCR NEGATIVE NEGATIVE Final    Comment: (NOTE) The Xpert Xpress SARS-CoV-2/FLU/RSV plus assay is intended as an aid in the diagnosis of influenza from Nasopharyngeal swab specimens and should not be used as a sole basis for treatment. Nasal washings and aspirates are unacceptable for Xpert Xpress SARS-CoV-2/FLU/RSV testing.  Fact  Sheet for Patients: EntrepreneurPulse.com.au  Fact Sheet for Healthcare Providers: IncredibleEmployment.be  This test is not yet approved or cleared by the [place] FDA and has been authorized for detection and/or diagnosis of SARS-CoV-2 by FDA under an Emergency Use Authorization (EUA). This EUA will remain in effect (meaning this test can be used) for the duration of the COVID-19 declaration under Section 564(b)(1) of the Act, 21 U.S.C. section 360bbb-3(b)(1), unless the authorization is terminated or revoked.  Performed at [facility], [address] [place], [place] [address]      Scheduled Meds:  amLODipine  5 mg Oral q morning   fluticasone furoate-vilanterol  1 puff Inhalation q morning   heparin  5,000 Units Subcutaneous Q8H   hydrochlorothiazide  12.5 mg Oral Daily   insulin aspart  0-5 Units Subcutaneous QHS   insulin aspart  0-9 Units Subcutaneous TID WC   losartan  50 mg Oral Daily   metoprolol succinate  50 mg Oral q morning   pantoprazole (PROTONIX) IV  40 mg Intravenous QHS      LOS: 0 days   Cherene Altes, MD [facility] Office  [phone] Pager - Text Page per Shea Evans  If 7PM-7AM, please contact night-coverage per Amion 09/01/2021, 10:51 AM

## 2021-09-02 ENCOUNTER — Inpatient Hospital Stay (HOSPITAL_COMMUNITY): Payer: 59 | Admitting: Anesthesiology

## 2021-09-02 ENCOUNTER — Encounter (HOSPITAL_COMMUNITY): Payer: Self-pay | Admitting: Internal Medicine

## 2021-09-02 ENCOUNTER — Inpatient Hospital Stay (HOSPITAL_COMMUNITY): Payer: 59

## 2021-09-02 ENCOUNTER — Encounter (HOSPITAL_COMMUNITY)
Admission: EM | Disposition: A | Discharge status: Home / Self Care | Payer: Self-pay | Source: Home / Self Care | Attending: Internal Medicine

## 2021-09-02 DIAGNOSIS — K2970 Gastritis, unspecified, without bleeding: Secondary | ICD-10-CM

## 2021-09-02 DIAGNOSIS — R945 Abnormal results of liver function studies: Secondary | ICD-10-CM | POA: Diagnosis not present

## 2021-09-02 DIAGNOSIS — R17 Unspecified jaundice: Secondary | ICD-10-CM | POA: Diagnosis not present

## 2021-09-02 DIAGNOSIS — K769 Liver disease, unspecified: Secondary | ICD-10-CM | POA: Diagnosis not present

## 2021-09-02 DIAGNOSIS — R7989 Other specified abnormal findings of blood chemistry: Secondary | ICD-10-CM | POA: Diagnosis not present

## 2021-09-02 HISTORY — PX: BIOPSY: SHX5522

## 2021-09-02 HISTORY — PX: ESOPHAGOGASTRODUODENOSCOPY (EGD) WITH PROPOFOL: SHX5813

## 2021-09-02 LAB — URINALYSIS, ROUTINE W REFLEX MICROSCOPIC
Glucose, UA: 100 mg/dL — AB
Hgb urine dipstick: NEGATIVE
Ketones, ur: NEGATIVE mg/dL
Nitrite: NEGATIVE
Specific Gravity, Urine: 1.02 (ref 1.005–1.030)
pH: 5.5 (ref 5.0–8.0)

## 2021-09-02 LAB — HEPATITIS PANEL, ACUTE
HCV Ab: NONREACTIVE
Hep A IgM: NONREACTIVE
Hep B C IgM: REACTIVE — AB
Hepatitis B Surface Ag: REACTIVE — AB

## 2021-09-02 LAB — CBC
HCT: 41.9 % (ref 36.0–46.0)
Hemoglobin: 13.3 g/dL (ref 12.0–15.0)
MCH: 27.6 pg (ref 26.0–34.0)
MCHC: 31.7 g/dL (ref 30.0–36.0)
MCV: 86.9 fL (ref 80.0–100.0)
Platelets: 155 10*3/uL (ref 150–400)
RBC: 4.82 MIL/uL (ref 3.87–5.11)
RDW: 19.6 % — ABNORMAL HIGH (ref 11.5–15.5)
WBC: 5.6 10*3/uL (ref 4.0–10.5)
nRBC: 0 % (ref 0.0–0.2)

## 2021-09-02 LAB — COMPREHENSIVE METABOLIC PANEL
ALT: 1450 U/L — ABNORMAL HIGH (ref 0–44)
AST: 1036 U/L — ABNORMAL HIGH (ref 15–41)
Albumin: 3.4 g/dL — ABNORMAL LOW (ref 3.5–5.0)
Alkaline Phosphatase: 119 U/L (ref 38–126)
Anion gap: 7 (ref 5–15)
BUN: 22 mg/dL — ABNORMAL HIGH (ref 6–20)
CO2: 28 mmol/L (ref 22–32)
Calcium: 8.8 mg/dL — ABNORMAL LOW (ref 8.9–10.3)
Chloride: 99 mmol/L (ref 98–111)
Creatinine, Ser: 2.32 mg/dL — ABNORMAL HIGH (ref 0.44–1.00)
GFR, Estimated: 25 mL/min — ABNORMAL LOW (ref >60)
Glucose, Bld: 110 mg/dL — ABNORMAL HIGH (ref 70–99)
Potassium: 3.7 mmol/L (ref 3.5–5.1)
Sodium: 134 mmol/L — ABNORMAL LOW (ref 135–145)
Total Bilirubin: 15.4 mg/dL — ABNORMAL HIGH (ref 0.3–1.2)
Total Protein: 6.9 g/dL (ref 6.5–8.1)

## 2021-09-02 LAB — BASIC METABOLIC PANEL
Anion gap: 8 (ref 5–15)
BUN: 24 mg/dL — ABNORMAL HIGH (ref 6–20)
CO2: 25 mmol/L (ref 22–32)
Calcium: 8.5 mg/dL — ABNORMAL LOW (ref 8.9–10.3)
Chloride: 100 mmol/L (ref 98–111)
Creatinine, Ser: 2.54 mg/dL — ABNORMAL HIGH (ref 0.44–1.00)
GFR, Estimated: 23 mL/min — ABNORMAL LOW (ref >60)
Glucose, Bld: 99 mg/dL (ref 70–99)
Potassium: 3.8 mmol/L (ref 3.5–5.1)
Sodium: 133 mmol/L — ABNORMAL LOW (ref 135–145)

## 2021-09-02 LAB — URINALYSIS, MICROSCOPIC (REFLEX): RBC / HPF: NONE SEEN RBC/hpf (ref 0–5)

## 2021-09-02 LAB — GLUCOSE, CAPILLARY
Glucose-Capillary: 101 mg/dL — ABNORMAL HIGH (ref 70–99)
Glucose-Capillary: 108 mg/dL — ABNORMAL HIGH (ref 70–99)
Glucose-Capillary: 116 mg/dL — ABNORMAL HIGH (ref 70–99)
Glucose-Capillary: 102 mg/dL — ABNORMAL HIGH (ref 70–99)

## 2021-09-02 LAB — IGG, IGA, IGM
IgA: 163 mg/dL (ref 87–352)
IgG (Immunoglobin G), Serum: 1294 mg/dL (ref 586–1602)
IgM (Immunoglobulin M), Srm: 222 mg/dL — ABNORMAL HIGH (ref 26–217)

## 2021-09-02 LAB — EPSTEIN-BARR VIRUS VCA, IGM: EBV VCA IgM: <36 U/mL (ref 0.0–35.9)

## 2021-09-02 LAB — SODIUM, URINE, RANDOM: Sodium, Ur: 49 mmol/L

## 2021-09-02 LAB — PROTIME-INR
INR: 1 (ref 0.8–1.2)
Prothrombin Time: 13.6 seconds (ref 11.4–15.2)

## 2021-09-02 LAB — HAPTOGLOBIN: Haptoglobin: <10 mg/dL — ABNORMAL LOW (ref 42–296)

## 2021-09-02 SURGERY — ESOPHAGOGASTRODUODENOSCOPY (EGD) WITH PROPOFOL
Anesthesia: General

## 2021-09-02 MED ORDER — SODIUM CHLORIDE 0.9 % IV SOLN: INTRAVENOUS | Status: DC

## 2021-09-02 MED ORDER — PROPOFOL 10 MG/ML IV BOLUS
INTRAVENOUS | Status: DC | PRN
  Administered 2021-09-02: 100 mg via INTRAVENOUS
  Administered 2021-09-02: 80 mg via INTRAVENOUS

## 2021-09-02 MED ORDER — MORPHINE SULFATE (PF) 2 MG/ML IV SOLN
2.0000 mg | Freq: Once | INTRAVENOUS | Status: AC
Start: 2021-09-03 — End: 2021-09-03
  Administered 2021-09-03: 2 mg via INTRAVENOUS
  Filled 2021-09-02: qty 1

## 2021-09-02 MED ORDER — HEPARIN SODIUM (PORCINE) 5000 UNIT/ML IJ SOLN
5000.0000 [IU] | Freq: Three times a day (TID) | INTRAMUSCULAR | Status: DC
  Administered 2021-09-02 – 2021-09-06 (×12): 5000 [IU] via SUBCUTANEOUS
  Filled 2021-09-02 (×13): qty 1

## 2021-09-02 MED ORDER — PROMETHAZINE HCL 25 MG/ML IJ SOLN
INTRAMUSCULAR | Status: AC
  Filled 2021-09-02: qty 1

## 2021-09-02 MED ORDER — LACTATED RINGERS IV SOLN: INTRAVENOUS | Status: DC | PRN

## 2021-09-02 MED ORDER — HYDROMORPHONE HCL 1 MG/ML IJ SOLN
0.5000 mg | INTRAMUSCULAR | Status: DC | PRN
  Administered 2021-09-02: 0.5 mg via INTRAVENOUS
  Administered 2021-09-02 – 2021-09-03 (×5): 1 mg via INTRAVENOUS
  Filled 2021-09-02 (×6): qty 1

## 2021-09-02 MED ORDER — STERILE WATER FOR IRRIGATION IR SOLN
Status: DC | PRN
  Administered 2021-09-02: 100 mL

## 2021-09-02 MED ORDER — SODIUM CHLORIDE 0.9 % IV BOLUS
500.0000 mL | Freq: Once | INTRAVENOUS | Status: AC
  Administered 2021-09-02: 500 mL via INTRAVENOUS

## 2021-09-02 MED ORDER — SODIUM CHLORIDE 0.9 % IV SOLN
6.2500 mg | Freq: Four times a day (QID) | INTRAVENOUS | Status: DC | PRN
  Administered 2021-09-02 – 2021-09-10 (×14): 6.25 mg via INTRAVENOUS
  Filled 2021-09-02 (×11): qty 0.25

## 2021-09-02 MED ORDER — LIDOCAINE HCL (CARDIAC) PF 100 MG/5ML IV SOSY
PREFILLED_SYRINGE | INTRAVENOUS | Status: DC | PRN
  Administered 2021-09-02: 100 mg via INTRAVENOUS

## 2021-09-02 MED ORDER — LACTATED RINGERS IV SOLN: INTRAVENOUS | Status: DC

## 2021-09-02 MED ORDER — MIDODRINE HCL 5 MG PO TABS
10.0000 mg | ORAL_TABLET | Freq: Three times a day (TID) | ORAL | Status: DC
  Administered 2021-09-02 – 2021-09-04 (×5): 10 mg via ORAL
  Filled 2021-09-02 (×5): qty 2

## 2021-09-02 NOTE — Op Note (Signed)
[facility] Patient Name: Shelly Larson Procedure Date: 09/02/2021 11:30 AM MRN: [id number] Date of Birth: Dec 24, 1971 Attending MD: Elon Alas. Abbey Chatters DO CSN: [id number] Age: 50 Admit Type: Inpatient Procedure:                Upper GI endoscopy Indications:              Epigastric abdominal pain Providers:                Elon Alas. Abbey Chatters, DO, Lambert Mody, Caprice Kluver, Kristine L. Risa Grill, Technician Referring MD:              Medicines:                See the Anesthesia note for documentation of the                            administered medications Complications:            No immediate complications. Estimated Blood Loss:     Estimated blood loss was minimal. Procedure:                Pre-Anesthesia Assessment:                           - The anesthesia plan was to use monitored                            anesthesia care (MAC).                           After obtaining informed consent, the endoscope was                            passed under direct vision. Throughout the                            procedure, the patient's blood pressure, pulse, and                            oxygen saturations were monitored continuously. The                            [id number]-H190 ([id number]) scope was introduced through the                            mouth, and advanced to the second part of duodenum.                            The upper GI endoscopy was accomplished without                            difficulty. The patient tolerated the procedure                            well. Scope  In: 11:45:02 AM Scope Out: 11:48:03 AM Total Procedure Duration: 0 hours 3 minutes 1 second  Findings:      There is no endoscopic evidence of bleeding, areas of erosion,       esophagitis, hiatal hernia, ulcerations or varices in the entire       esophagus.      The Z-line was regular and was found 39 cm from the incisors.      Patchy mild inflammation characterized by  erythema was found in the       entire examined stomach. Biopsies were taken with a cold forceps for       Helicobacter pylori testing.      The duodenal bulb, first portion of the duodenum and second portion of       the duodenum were normal. Impression:               - Z-line regular, 39 cm from the incisors.                           - Gastritis. Biopsied.                           - Normal duodenal bulb, first portion of the                            duodenum and second portion of the duodenum. Moderate Sedation:      Per Anesthesia Care Recommendation:           - Return patient to hospital ward for ongoing care.                           - Advance diet as tolerated.                           - Continue on IV PPI BID while inpatient                           - Do not think today's exam identified patient's                            source of abdominal pain. Procedure Code(s):        --- Professional ---                           [phone], Esophagogastroduodenoscopy, flexible,                            transoral; with biopsy, single or multiple Diagnosis Code(s):        --- Professional ---                           K29.70, Gastritis, unspecified, without bleeding                           R10.13, Epigastric pain CPT copyright 2019 American Medical Association. All rights reserved. The codes documented in this report are preliminary and upon coder review may  be revised to meet current compliance requirements. Elon Alas. Abbey Chatters,  DO Elon Alas. Abbey Chatters, DO 09/02/2021 11:53:06 AM This report has been signed electronically. Number of Addenda: 0

## 2021-09-02 NOTE — Progress Notes (Addendum)
Shelly Larson  [id number] DOB: 1971-10-05 DOA: 08/31/2021 PCP: [facility], [facility]    Brief Narrative:  50 year old with a history of hypertension, asthma, colon cancer status post segmental resection 2016, biliary pancreatitis status post cholecystectomy 2010, and DM2 who was told to present to the ED because of abnormal liver enzymes.  She was being evaluated for abdominal pain with nagging nausea by her PCP.  This included blood work which revealed elevated LFTs.  In the ED AST was noted to be 1036, and ALT was noted to be 1476.  Total bilirubin was 13.  RUQ ultrasound noted increased echogenicity of the liver consistent with fatty infiltrate but no other obvious lesions.  Consultants:  None  Code Status: FULL CODE  Antimicrobials:  None  DVT prophylaxis: Subcutaneous heparin  Subjective: Afebrile. VSS. Complaining of ongoing poorly controlled pain in epigastrium. Climbing creatinine newly appreciated this morning. Persisting severe nausea w/o signif change. Reports that she is urinating freely w/o dysuria.   Assessment & Plan:  Transaminitis / Elevated Tbil - Acute idiopathic hepatitis Patient admitted to taking Tylenol every 4-6 hours for approximately 1 month - + for Hepatitis B Ab w/ Ag studies pending - T bil climbing - AST/ALT essentially unchanged - symptoms persist w/o change - EGD w/o signif pathologic findings   Recent Labs  Lab 08/31/21 1440 08/31/21 2021 09/01/21 0026 09/01/21 0431 09/02/21 0620  AST 1,036*  --  1,103* 1,083* 1,036*  ALT 1,476*  --  1,402* 1,446* 1,450*  ALKPHOS 124  --  119 116 119  BILITOT 13.0* 13.7* 13.4* 13.1* 15.4*  PROT 7.5  --  7.1 6.8 6.9  ALBUMIN 3.8  --  3.6 3.4* 3.4*    Acute kidney failure Newly developed on labs this morning - no contrasted studies during this admit - hydrate - renal US w/ no evidence of hydronephrosis - this is a very worrisome finding - stopped ARB - follow closely - ?hepatorenal - if renal function  worsens over night will consult Nephrology to consider octreotide and albumin infusions - initiate midodrine now   Recent Labs  Lab 08/31/21 1440 09/01/21 0431 09/02/21 0620  CREATININE 1.04* 0.98 2.32*    HTN BP controlled - no signif hypotension   Asthma Well compensated at present -continue usual home inhaled medications  DM2 A1c 6.2 - CBG well controlled    Family Communication: No family present at time of exam Status is: Inpatient  Remains inpatient appropriate because:Inpatient level of care appropriate due to severity of illness  Dispo: The patient is from: Home              Anticipated d/c is to: Home              Patient currently is not medically stable to d/c.   Difficult to place patient No   Objective: Blood pressure 133/85, pulse 65, temperature 98.4 F (36.9 C), temperature source Oral, resp. rate 20, height 6' (1.829 m), weight 122.5 kg, SpO2 94 %.  Intake/Output Summary (Last 24 hours) at 09/02/2021 0936 Last data filed at 09/01/2021 1700 Gross per 24 hour  Intake 360 ml  Output --  Net 360 ml    Filed Weights   08/31/21 1405  Weight: 122.5 kg    Examination: General: No acute respiratory distress Lungs: Clear to auscultation bilaterally  Cardiovascular: RRR w/o M or rub  Abdomen: Tenderness to palpation across epigastrium, BS+, no mass, no rebound, not distended  Extremities: No significant C/C/E B LE  CBC: Recent Labs  Lab 08/31/21 1440 09/01/21 0431 09/02/21 0620  WBC 4.9 5.5 5.6  NEUTROABS 3.0  --   --   HGB 14.7 13.6 13.3  HCT 45.0 42.6 41.9  MCV 86.9 87.1 86.9  PLT 160 150 [id number]    Basic Metabolic Panel: Recent Labs  Lab 08/31/21 1440 09/01/21 0431 09/02/21 0620  NA 137 137 134*  K 3.5 3.7 3.7  CL 101 101 99  CO2 '28 27 28  '$ GLUCOSE 113* 125* 110*  BUN 15 16 22*  CREATININE 1.04* 0.98 2.32*  CALCIUM 9.3 9.0 8.8*  MG  --  2.3  --   PHOS  --  3.9  --     GFR: Estimated Creatinine Clearance: 44 mL/min (A) (by C-G  formula based on SCr of 2.32 mg/dL (H)).  Liver Function Tests: Recent Labs  Lab 08/31/21 1440 08/31/21 2021 09/01/21 0026 09/01/21 0431 09/02/21 0620  AST 1,036*  --  1,103* 1,083* 1,036*  ALT 1,476*  --  1,402* 1,446* 1,450*  ALKPHOS 124  --  119 116 119  BILITOT 13.0* 13.7* 13.4* 13.1* 15.4*  PROT 7.5  --  7.1 6.8 6.9  ALBUMIN 3.8  --  3.6 3.4* 3.4*    Recent Labs  Lab 08/31/21 1442  LIPASE 27     Coagulation Profile: Recent Labs  Lab 08/31/21 2021 09/01/21 0026 09/01/21 0431 09/02/21 0620  INR 1.0 1.1 1.0 1.0      HbA1C: Hemoglobin A1C  Date/Time Value Ref Range Status  05/03/2018 12:00 AM 6.1  Final   Hgb A1c MFr Bld  Date/Time Value Ref Range Status  09/01/2021 12:26 AM 6.2 (H) 4.8 - 5.6 % Final    Comment:    (NOTE) Pre diabetes:          5.7%-6.4%  Diabetes:              >6.4%  Glycemic control for   <7.0% adults with diabetes   09/04/2018 04:00 PM 6.2 (H) 4.8 - 5.6 % Final    Comment:    (NOTE) Pre diabetes:          5.7%-6.4% Diabetes:              >6.4% Glycemic control for   <7.0% adults with diabetes     CBG: Recent Labs  Lab 09/01/21 0752 09/01/21 1133 09/01/21 1618 09/01/21 2102 09/02/21 0748  GLUCAP 112* 120* 138* 98 101*     Recent Results (from the past 240 hour(s))  Resp Panel by RT-PCR (Flu A&B, Covid) Nasopharyngeal Swab     Status: None   Collection Time: 08/31/21  8:25 PM   Specimen: Nasopharyngeal Swab; Nasopharyngeal(NP) swabs in vial transport medium  Result Value Ref Range Status   SARS Coronavirus 2 by RT PCR NEGATIVE NEGATIVE Final    Comment: (NOTE) SARS-CoV-2 target nucleic acids are NOT DETECTED.  The SARS-CoV-2 RNA is generally detectable in upper respiratory specimens during the acute phase of infection. The lowest concentration of SARS-CoV-2 viral copies this assay can detect is 138 copies/mL. A negative result does not preclude SARS-Cov-2 infection and should not be used as the sole basis for  treatment or other patient management decisions. A negative result may occur with  improper specimen collection/handling, submission of specimen other than nasopharyngeal swab, presence of viral mutation(s) within the areas targeted by this assay, and inadequate number of viral copies(<138 copies/mL). A negative result must be combined with clinical observations, patient history, and epidemiological information. The expected  result is Negative.  Fact Sheet for Patients:  EntrepreneurPulse.com.au  Fact Sheet for Healthcare Providers:  IncredibleEmployment.be  This test is no t yet approved or cleared by the [place] FDA and  has been authorized for detection and/or diagnosis of SARS-CoV-2 by FDA under an Emergency Use Authorization (EUA). This EUA will remain  in effect (meaning this test can be used) for the duration of the COVID-19 declaration under Section 564(b)(1) of the Act, 21 U.S.C.section 360bbb-3(b)(1), unless the authorization is terminated  or revoked sooner.       Influenza A by PCR NEGATIVE NEGATIVE Final   Influenza B by PCR NEGATIVE NEGATIVE Final    Comment: (NOTE) The Xpert Xpress SARS-CoV-2/FLU/RSV plus assay is intended as an aid in the diagnosis of influenza from Nasopharyngeal swab specimens and should not be used as a sole basis for treatment. Nasal washings and aspirates are unacceptable for Xpert Xpress SARS-CoV-2/FLU/RSV testing.  Fact Sheet for Patients: EntrepreneurPulse.com.au  Fact Sheet for Healthcare Providers: IncredibleEmployment.be  This test is not yet approved or cleared by the [place] FDA and has been authorized for detection and/or diagnosis of SARS-CoV-2 by FDA under an Emergency Use Authorization (EUA). This EUA will remain in effect (meaning this test can be used) for the duration of the COVID-19 declaration under Section 564(b)(1) of the Act, 21  U.S.C. section 360bbb-3(b)(1), unless the authorization is terminated or revoked.  Performed at [facility], [address], [place], [place] [address]       Scheduled Meds:  amLODipine  5 mg Oral q morning   fluticasone furoate-vilanterol  1 puff Inhalation q morning   heparin  5,000 Units Subcutaneous Q8H   hydrochlorothiazide  12.5 mg Oral Daily   insulin aspart  0-5 Units Subcutaneous QHS   insulin aspart  0-9 Units Subcutaneous TID WC   losartan  50 mg Oral Daily   metoprolol succinate  50 mg Oral q morning   ondansetron (ZOFRAN) IV  4 mg Intravenous Q6H   pantoprazole (PROTONIX) IV  40 mg Intravenous QHS      LOS: 1 day   Cherene Altes, MD [facility] Office  [phone] Pager - Text Page per Shea Evans  If 7PM-7AM, please contact night-coverage per Amion 09/02/2021, 9:36 AM

## 2021-09-02 NOTE — Transfer of Care (Signed)
Immediate Anesthesia Transfer of Care Note  Patient: Shelly Larson  Procedure(s) Performed: ESOPHAGOGASTRODUODENOSCOPY (EGD) WITH PROPOFOL BIOPSY  Patient Location: PACU  Anesthesia Type:General  Level of Consciousness: awake, alert , oriented and patient cooperative  Airway & Oxygen Therapy: Patient Spontanous Breathing  Post-op Assessment: Report given to RN, Post -op Vital signs reviewed and stable and Patient moving all extremities X 4  Post vital signs: Reviewed and stable  Last Vitals:  Vitals Value Taken Time  BP    Temp    Pulse    Resp    SpO2      Last Pain:  Vitals:   09/02/21 1143  TempSrc:   PainSc: 3          Complications: No notable events documented.

## 2021-09-02 NOTE — Anesthesia Preprocedure Evaluation (Signed)
Anesthesia Evaluation  Patient identified by MRN, date of birth, ID band Patient awake    Reviewed: Allergy & Precautions, H&P , NPO status , Patient's Chart, lab work & pertinent test results, reviewed documented beta blocker date and time   Airway Mallampati: II  TM Distance: >3 FB Neck ROM: full    Dental no notable dental hx.    Pulmonary asthma ,    Pulmonary exam normal breath sounds clear to auscultation       Cardiovascular Exercise Tolerance: Good hypertension, negative cardio ROS   Rhythm:regular Rate:Normal     Neuro/Psych  Headaches, PSYCHIATRIC DISORDERS    GI/Hepatic negative GI ROS, Neg liver ROS,   Endo/Other  negative endocrine ROSdiabetes  Renal/GU negative Renal ROS  negative genitourinary   Musculoskeletal   Abdominal   Peds  Hematology  (+) Blood dyscrasia, anemia ,   Anesthesia Other Findings ADHD on chronic stimulants  Reproductive/Obstetrics negative OB ROS                             Anesthesia Physical Anesthesia Plan  ASA: 3 and emergent  Anesthesia Plan: General   Post-op Pain Management:    Induction:   PONV Risk Score and Plan: Propofol infusion  Airway Management Planned:   Additional Equipment:   Intra-op Plan:   Post-operative Plan:   Informed Consent: I have reviewed the patients History and Physical, chart, labs and discussed the procedure including the risks, benefits and alternatives for the proposed anesthesia with the patient or authorized representative who has indicated his/her understanding and acceptance.     Dental Advisory Given  Plan Discussed with: CRNA  Anesthesia Plan Comments:         Anesthesia Quick Evaluation

## 2021-09-02 NOTE — Interval H&P Note (Signed)
History and Physical Interval Note:  09/02/2021 11:42 AM  Shelly Larson  has presented today for surgery, with the diagnosis of epigastric pain.  The various methods of treatment have been discussed with the patient and family. After consideration of risks, benefits and other options for treatment, the patient has consented to  Procedure(s): ESOPHAGOGASTRODUODENOSCOPY (EGD) WITH PROPOFOL (N/A) as a surgical intervention.  The patient's history has been reviewed, patient examined, no change in status, stable for surgery.  I have reviewed the patient's chart and labs.  Questions were answered to the patient's satisfaction.     Eloise Harman

## 2021-09-02 NOTE — Anesthesia Postprocedure Evaluation (Signed)
Anesthesia Post Note  Patient: Shelly Larson  Procedure(s) Performed: ESOPHAGOGASTRODUODENOSCOPY (EGD) WITH PROPOFOL BIOPSY  Patient location during evaluation: Phase II Anesthesia Type: General Level of consciousness: awake Pain management: pain level controlled Vital Signs Assessment: post-procedure vital signs reviewed and stable Respiratory status: spontaneous breathing and respiratory function stable Cardiovascular status: blood pressure returned to baseline and stable Postop Assessment: no headache and no apparent nausea or vomiting Anesthetic complications: no Comments: Late entry   No notable events documented.   Last Vitals:  Vitals:   09/02/21 1215 09/02/21 1300  BP: 129/76 (!) 147/88  Pulse: (!) 58 61  Resp: (!) 24 18  Temp:  36.7 C  SpO2: 98% 95%    Last Pain:  Vitals:   09/02/21 1300  TempSrc: Oral  PainSc:                  Louann Sjogren

## 2021-09-03 DIAGNOSIS — B169 Acute hepatitis B without delta-agent and without hepatic coma: Secondary | ICD-10-CM | POA: Diagnosis not present

## 2021-09-03 DIAGNOSIS — R7989 Other specified abnormal findings of blood chemistry: Secondary | ICD-10-CM | POA: Diagnosis not present

## 2021-09-03 DIAGNOSIS — R945 Abnormal results of liver function studies: Secondary | ICD-10-CM | POA: Diagnosis not present

## 2021-09-03 DIAGNOSIS — K769 Liver disease, unspecified: Secondary | ICD-10-CM | POA: Diagnosis not present

## 2021-09-03 DIAGNOSIS — R1010 Upper abdominal pain, unspecified: Secondary | ICD-10-CM | POA: Diagnosis not present

## 2021-09-03 DIAGNOSIS — R17 Unspecified jaundice: Secondary | ICD-10-CM | POA: Diagnosis not present

## 2021-09-03 LAB — PHOSPHORUS: Phosphorus: 4.7 mg/dL — ABNORMAL HIGH (ref 2.5–4.6)

## 2021-09-03 LAB — CBC
HCT: 39.9 % (ref 36.0–46.0)
Hemoglobin: 12.9 g/dL (ref 12.0–15.0)
MCH: 28.4 pg (ref 26.0–34.0)
MCHC: 32.3 g/dL (ref 30.0–36.0)
MCV: 87.7 fL (ref 80.0–100.0)
Platelets: 151 10*3/uL (ref 150–400)
RBC: 4.55 MIL/uL (ref 3.87–5.11)
RDW: 19.8 % — ABNORMAL HIGH (ref 11.5–15.5)
WBC: 5.8 10*3/uL (ref 4.0–10.5)
nRBC: 0 % (ref 0.0–0.2)

## 2021-09-03 LAB — COMPREHENSIVE METABOLIC PANEL
ALT: 1446 U/L — ABNORMAL HIGH (ref 0–44)
AST: 1210 U/L — ABNORMAL HIGH (ref 15–41)
Albumin: 3.2 g/dL — ABNORMAL LOW (ref 3.5–5.0)
Alkaline Phosphatase: 115 U/L (ref 38–126)
Anion gap: 6 (ref 5–15)
BUN: 26 mg/dL — ABNORMAL HIGH (ref 6–20)
CO2: 26 mmol/L (ref 22–32)
Calcium: 8.1 mg/dL — ABNORMAL LOW (ref 8.9–10.3)
Chloride: 100 mmol/L (ref 98–111)
Creatinine, Ser: 2.67 mg/dL — ABNORMAL HIGH (ref 0.44–1.00)
GFR, Estimated: 22 mL/min — ABNORMAL LOW (ref >60)
Glucose, Bld: 106 mg/dL — ABNORMAL HIGH (ref 70–99)
Potassium: 3.8 mmol/L (ref 3.5–5.1)
Sodium: 132 mmol/L — ABNORMAL LOW (ref 135–145)
Total Bilirubin: 16.2 mg/dL — ABNORMAL HIGH (ref 0.3–1.2)
Total Protein: 6.5 g/dL (ref 6.5–8.1)

## 2021-09-03 LAB — GLUCOSE, CAPILLARY
Glucose-Capillary: 106 mg/dL — ABNORMAL HIGH (ref 70–99)
Glucose-Capillary: 121 mg/dL — ABNORMAL HIGH (ref 70–99)
Glucose-Capillary: 88 mg/dL (ref 70–99)
Glucose-Capillary: 114 mg/dL — ABNORMAL HIGH (ref 70–99)

## 2021-09-03 LAB — HEPATITIS B E ANTIGEN: Hep B E Ag: POSITIVE — AB

## 2021-09-03 LAB — PROTIME-INR
INR: 1 (ref 0.8–1.2)
INR: 1.1 (ref 0.8–1.2)
Prothrombin Time: 13.4 seconds (ref 11.4–15.2)
Prothrombin Time: 14.1 seconds (ref 11.4–15.2)

## 2021-09-03 LAB — HSV(HERPES SIMPLEX VRS) I + II AB-IGM: HSVI/II Comb IgM: 1.24 Ratio — ABNORMAL HIGH (ref 0.00–0.90)

## 2021-09-03 LAB — CMV IGM: CMV IgM: <30 AU/mL (ref 0.0–29.9)

## 2021-09-03 MED ORDER — MORPHINE SULFATE (PF) 2 MG/ML IV SOLN: 2.0000 mg | Freq: Once | INTRAVENOUS | Status: DC

## 2021-09-03 MED ORDER — INFLUENZA VAC SPLIT QUAD 0.5 ML IM SUSY
0.5000 mL | PREFILLED_SYRINGE | INTRAMUSCULAR | Status: AC
  Administered 2021-09-05: 0.5 mL via INTRAMUSCULAR
  Filled 2021-09-03: qty 0.5

## 2021-09-03 MED ORDER — PROMETHAZINE HCL 25 MG/ML IJ SOLN
INTRAMUSCULAR | Status: AC
  Filled 2021-09-03: qty 1

## 2021-09-03 MED ORDER — HYDROMORPHONE HCL 1 MG/ML IJ SOLN
0.5000 mg | INTRAMUSCULAR | Status: DC | PRN
  Administered 2021-09-03: 1 mg via INTRAVENOUS
  Filled 2021-09-03: qty 1

## 2021-09-03 MED ORDER — FENTANYL CITRATE PF 50 MCG/ML IJ SOSY
12.5000 ug | PREFILLED_SYRINGE | INTRAMUSCULAR | Status: DC | PRN
  Administered 2021-09-03 – 2021-09-04 (×7): 25 ug via INTRAVENOUS
  Filled 2021-09-03 (×7): qty 1

## 2021-09-03 NOTE — Plan of Care (Signed)
On call MD notified x 2 for pain management through the night.  Pt states she was able to rest for about 45 minutes this AM after dose was given 40 minutes early.  Nausea managed well with scheduled Zofran and PRN Phenergan.  Ayesha Mohair BSN RN CMSRN  Problem: Pain Managment: Goal: General experience of comfort will improve Outcome: Not Progressing

## 2021-09-03 NOTE — Progress Notes (Addendum)
Subjective: Patient states she is due about the same.  Continues to have epigastric pain, does not radiate.  Also with nausea receiving Phenergan and Zofran.  Bowels moving well.  Tolerating diet.  Objective: Vital signs in last 24 hours: Temp:  [97.9 F (36.6 C)-98.6 F (37 C)] 98.6 F (37 C) (09/10 0551) Pulse Rate:  [59-66] 66 (09/10 0902) Resp:  [18-20] 20 (09/10 0902) BP: (144-162)/(80-96) 162/96 (09/10 0902) SpO2:  [93 %-97 %] 93 % (09/10 0902) Last BM Date: 08/31/21 General:   Alert and oriented, pleasant Abdomen:  Bowel sounds present, soft, tender to palpation epigastric region, non-distended. No HSM or hernias noted. No rebound or guarding. No masses appreciated  Msk:  Symmetrical without gross deformities. Normal posture. Extremities:  Without clubbing or edema. Neurologic:  Alert and  oriented x4;  grossly normal neurologically. Skin:  Warm and dry, intact without significant lesions.  Cervical Nodes:  No significant cervical adenopathy. Psych:  Alert and cooperative. Normal mood and affect.  Intake/Output from previous day: 09/09 0701 - 09/10 0700 In: 2271.8 [P.O.:240; I.V.:1839.5; IV Piggyback:192.3] Out: 750 [Urine:750] Intake/Output this shift: Total I/O In: 240 [P.O.:240] Out: -   Lab Results: Recent Labs    09/01/21 0431 09/02/21 0620 09/03/21 0629  WBC 5.5 5.6 5.8  HGB 13.6 13.3 12.9  HCT 42.6 41.9 39.9  PLT 150 155 151   BMET Recent Labs    09/02/21 0620 09/02/21 1305 09/03/21 0629  NA 134* 133* 132*  K 3.7 3.8 3.8  CL 99 100 100  CO2 '28 25 26  '$ GLUCOSE 110* 99 106*  BUN 22* 24* 26*  CREATININE 2.32* 2.54* 2.67*  CALCIUM 8.8* 8.5* 8.1*   LFT Recent Labs    08/31/21 2021 09/01/21 0026 09/01/21 0431 09/02/21 0620 09/03/21 0629  PROT  --  7.1 6.8 6.9 6.5  ALBUMIN  --  3.6 3.4* 3.4* 3.2*  AST  --  1,103* 1,083* 1,036* 1,210*  ALT  --  1,402* 1,446* 1,450* 1,446*  ALKPHOS  --  119 116 119 115  BILITOT 13.7* 13.4* 13.1* 15.4* 16.2*   BILIDIR 8.6* 8.5*  --   --   --   IBILI 5.1* 4.9*  --   --   --    PT/INR Recent Labs    09/02/21 0620 09/03/21 0934  LABPROT 13.6 13.4  INR 1.0 1.0   Hepatitis Panel Recent Labs    08/31/21 2021  HEPBSAG Reactive*  HCVAB NON REACTIVE  HEPAIGM NON REACTIVE  HEPBIGM Reactive*     Studies/Results: US RENAL  Result Date: 09/02/2021 CLINICAL DATA:  Acute renal failure Hypertension EXAM: RENAL / URINARY TRACT ULTRASOUND COMPLETE COMPARISON:  CT abdomen pelvis 11/21/2010 FINDINGS: Right Kidney: Renal measurements: 13.6 x 4.9 x 7.2 cm = volume: 251 mL. Echogenicity within normal limits. No mass or hydronephrosis visualized. Left Kidney: Renal measurements: 13.2 x 7.1 x 6.6 cm = volume: 324 mL. Echogenicity within normal limits. No mass or hydronephrosis visualized. Bladder: Appears normal for degree of bladder distention. Other: None. IMPRESSION: No significant sonographic abnormality of the kidneys. Electronically Signed   By: Miachel Roux M.D.   On: 09/02/2021 13:33    Assessment: *Acute hepatitis B *Abnormal liver function test due to above *Abdominal pain *Gastritis  Plan: Patient presenting with abnormal liver function tests in the setting of acute hepatitis B, surface antigen positive, core IgM positive, Be+, viral load pending.  Fortunately her synthetic function remains intact with INR 1.0.  Discussed hepatitis B in depth with  her today.  Approximately 95% of patients will clear infection on their own.  We will continue supportive measures for now.  If she has evidence of synthetic dysfunction, will consider antiviral therapy.  Continue daily LFTs and coags.  Analgesia and antiemetics per hospitalist.  Her worsening creatinine is certainly concerning.  Agree with nephrology consultation.  Abdominal pain possibly due to her acute hepatitis B.  Lipase WNL.  She does have a history of pancreatitis in the past status post cholecystectomy, ultrasound unremarkable besides  fatty liver.  May need to consider cross-sectional imaging though unsure how beneficial this would be without being able to give contrast due to her above acute kidney injury.  GI to continue to follow.  Elon Alas. Abbey Chatters, D.O. Gastroenterology and Hepatology [facility]   LOS: 2 days    09/03/2021, 12:51 PM

## 2021-09-03 NOTE — Progress Notes (Signed)
Pt received Dilaudid '1mg'$  at 2118.  Pt states it initially helped her pain, but didn't last.  Dr. Josephine Cables notified and one time dose of Morphine '2mg'$  IV given since it isn't time for next Dilaudid dose. Ayesha Mohair BSN RN CMSRN

## 2021-09-03 NOTE — Progress Notes (Signed)
Dr. Josephine Cables made aware of pt's pain.  Next dose of Dilaudid due after 0515.  Pt states she has gotten brief reduction in pain, but is not able to relax enough to sleep any.  Phenergan IV is effective for her nausea, but the abdominal pain brings on the nausea.  Dilaudid '1mg'$  IV given 40 minutes early per MD order.   Ayesha Mohair BSN RN CMSRN

## 2021-09-03 NOTE — Progress Notes (Signed)
Shelly Larson  [id number] DOB: 1971-12-15 DOA: 08/31/2021 PCP: [facility]    Brief Narrative:  50 year old with a history of hypertension, asthma, colon cancer status post segmental resection 2016, biliary pancreatitis status post cholecystectomy 2010, and DM2 who was told to present to the ED because of abnormal liver enzymes.  She was being evaluated for abdominal pain with nagging nausea by her PCP.  This included blood work which revealed elevated LFTs.  In the ED AST was noted to be 1036, and ALT was noted to be 1476.  Total bilirubin was 13.  RUQ ultrasound noted increased echogenicity of the liver consistent with fatty infiltrate but no other obvious lesions.  Consultants:  None  Code Status: FULL CODE  Antimicrobials:  None  DVT prophylaxis: Subcutaneous heparin  Subjective: Reported ongoing poorly controlled pain th/o the night. Afeb. BP stable.  No significant change in status.  Ongoing gnawing abdominal pain as well as intractable nausea.  No shortness of breath or chest pain.  Assessment & Plan:  Transaminitis / Elevated Tbil - Acute idiopathic hepatitis + for Hepatitis B surface Ag and Hep B Core IgM Ab - viral load pending - GI directing care - INR normal - T bil climbing - AST/ALT essentially unchanged - symptoms persist w/o change - EGD w/o signif pathologic findings - supportive care for now unless synthetic function becomes affected   Recent Labs  Lab 08/31/21 1440 08/31/21 2021 09/01/21 0026 09/01/21 0431 09/02/21 0620 09/03/21 0629  AST 1,036*  --  1,103* 1,083* 1,036* 1,210*  ALT 1,476*  --  1,402* 1,446* 1,450* 1,446*  ALKPHOS 124  --  119 116 119 115  BILITOT 13.0* 13.7* 13.4* 13.1* 15.4* 16.2*  PROT 7.5  --  7.1 6.8 6.9 6.5  ALBUMIN 3.8  --  3.6 3.4* 3.4* 3.2*   Acute kidney failure Newly developed on labs 9/9 - no contrasted studies during this admit - hydrating - renal US w/ no evidence of hydronephrosis - this is a very  worrisome finding - stopped ARB - follow closely - urine Na of 49 not c/w hepatorenal - if renal function worsens over night will consult Nephrology - appears to be plateauing for now - recheck in AM   Recent Labs  Lab 08/31/21 1440 09/01/21 0431 09/02/21 0620 09/02/21 1305 09/03/21 0629  CREATININE 1.04* 0.98 2.32* 2.54* 2.67*    HTN BP controlled - no signif hypotension   Asthma Well compensated at present -continue usual home inhaled medications  DM2 A1c 6.2 - CBG well controlled    Family Communication: No family present at time of exam Status is: Inpatient  Remains inpatient appropriate because:Inpatient level of care appropriate due to severity of illness  Dispo: The patient is from: Home              Anticipated d/c is to: Home              Patient currently is not medically stable to d/c.   Difficult to place patient No   Objective: Blood pressure (!) 169/59, pulse 61, temperature 98.9 F (37.2 C), temperature source Oral, resp. rate 16, height 6' (1.829 m), weight 122.5 kg, SpO2 93 %.  Intake/Output Summary (Last 24 hours) at 09/03/2021 1722 Last data filed at 09/03/2021 0902 Gross per 24 hour  Intake 2111.84 ml  Output 600 ml  Net 1511.84 ml   Filed Weights   08/31/21 1405  Weight: 122.5 kg    Examination: General: No acute respiratory  distress Lungs: Clear to auscultation bilaterally  Cardiovascular: RRR  Abdomen: Tenderness to palpation across epigastrium, BS+, no mass, no rebound, not distended  Extremities: No C/C/E B LE   CBC: Recent Labs  Lab 08/31/21 1440 09/01/21 0431 09/02/21 0620 09/03/21 0629  WBC 4.9 5.5 5.6 5.8  NEUTROABS 3.0  --   --   --   HGB 14.7 13.6 13.3 12.9  HCT 45.0 42.6 41.9 39.9  MCV 86.9 87.1 86.9 87.7  PLT 160 150 155 [id number]   Basic Metabolic Panel: Recent Labs  Lab 09/01/21 0431 09/02/21 0620 09/02/21 1305 09/03/21 0629  NA 137 134* 133* 132*  K 3.7 3.7 3.8 3.8  CL 101 99 100 100  CO2 '27 28 25 26  '$ GLUCOSE  125* 110* 99 106*  BUN 16 22* 24* 26*  CREATININE 0.98 2.32* 2.54* 2.67*  CALCIUM 9.0 8.8* 8.5* 8.1*  MG 2.3  --   --   --   PHOS 3.9  --   --  4.7*   GFR: Estimated Creatinine Clearance: 38.2 mL/min (A) (by C-G formula based on SCr of 2.67 mg/dL (H)).  Liver Function Tests: Recent Labs  Lab 09/01/21 0026 09/01/21 0431 09/02/21 0620 09/03/21 0629  AST 1,103* 1,083* 1,036* 1,210*  ALT 1,402* 1,446* 1,450* 1,446*  ALKPHOS 119 116 119 115  BILITOT 13.4* 13.1* 15.4* 16.2*  PROT 7.1 6.8 6.9 6.5  ALBUMIN 3.6 3.4* 3.4* 3.2*   Recent Labs  Lab 08/31/21 1442  LIPASE 27    Coagulation Profile: Recent Labs  Lab 08/31/21 2021 09/01/21 0026 09/01/21 0431 09/02/21 0620 09/03/21 0934  INR 1.0 1.1 1.0 1.0 1.0     HbA1C: Hemoglobin A1C  Date/Time Value Ref Range Status  05/03/2018 12:00 AM 6.1  Final   Hgb A1c MFr Bld  Date/Time Value Ref Range Status  09/01/2021 12:26 AM 6.2 (H) 4.8 - 5.6 % Final    Comment:    (NOTE) Pre diabetes:          5.7%-6.4%  Diabetes:              >6.4%  Glycemic control for   <7.0% adults with diabetes   09/04/2018 04:00 PM 6.2 (H) 4.8 - 5.6 % Final    Comment:    (NOTE) Pre diabetes:          5.7%-6.4% Diabetes:              >6.4% Glycemic control for   <7.0% adults with diabetes     CBG: Recent Labs  Lab 09/02/21 1611 09/02/21 2149 09/03/21 0745 09/03/21 1146 09/03/21 1628  GLUCAP 116* 102* 106* 121* 88    Recent Results (from the past 240 hour(s))  Resp Panel by RT-PCR (Flu A&B, Covid) Nasopharyngeal Swab     Status: None   Collection Time: 08/31/21  8:25 PM   Specimen: Nasopharyngeal Swab; Nasopharyngeal(NP) swabs in vial transport medium  Result Value Ref Range Status   SARS Coronavirus 2 by RT PCR NEGATIVE NEGATIVE Final    Comment: (NOTE) SARS-CoV-2 target nucleic acids are NOT DETECTED.  The SARS-CoV-2 RNA is generally detectable in upper respiratory specimens during the acute phase of infection. The  lowest concentration of SARS-CoV-2 viral copies this assay can detect is 138 copies/mL. A negative result does not preclude SARS-Cov-2 infection and should not be used as the sole basis for treatment or other patient management decisions. A negative result may occur with  improper specimen collection/handling, submission of specimen other than nasopharyngeal swab,  presence of viral mutation(s) within the areas targeted by this assay, and inadequate number of viral copies(<138 copies/mL). A negative result must be combined with clinical observations, patient history, and epidemiological information. The expected result is Negative.  Fact Sheet for Patients:  EntrepreneurPulse.com.au  Fact Sheet for Healthcare Providers:  IncredibleEmployment.be  This test is no t yet approved or cleared by the [place] FDA and  has been authorized for detection and/or diagnosis of SARS-CoV-2 by FDA under an Emergency Use Authorization (EUA). This EUA will remain  in effect (meaning this test can be used) for the duration of the COVID-19 declaration under Section 564(b)(1) of the Act, 21 U.S.C.section 360bbb-3(b)(1), unless the authorization is terminated  or revoked sooner.       Influenza A by PCR NEGATIVE NEGATIVE Final   Influenza B by PCR NEGATIVE NEGATIVE Final    Comment: (NOTE) The Xpert Xpress SARS-CoV-2/FLU/RSV plus assay is intended as an aid in the diagnosis of influenza from Nasopharyngeal swab specimens and should not be used as a sole basis for treatment. Nasal washings and aspirates are unacceptable for Xpert Xpress SARS-CoV-2/FLU/RSV testing.  Fact Sheet for Patients: EntrepreneurPulse.com.au  Fact Sheet for Healthcare Providers: IncredibleEmployment.be  This test is not yet approved or cleared by the [place] FDA and has been authorized for detection and/or diagnosis of SARS-CoV-2 by FDA under  an Emergency Use Authorization (EUA). This EUA will remain in effect (meaning this test can be used) for the duration of the COVID-19 declaration under Section 564(b)(1) of the Act, 21 U.S.C. section 360bbb-3(b)(1), unless the authorization is terminated or revoked.  Performed at [facility], [address] [place], [place] [address]       Scheduled Meds:  fluticasone furoate-vilanterol  1 puff Inhalation q morning   heparin  5,000 Units Subcutaneous Q8H   [START ON 09/04/2021] influenza vac split quadrivalent PF  0.5 mL Intramuscular Tomorrow-1000   insulin aspart  0-5 Units Subcutaneous QHS   insulin aspart  0-9 Units Subcutaneous TID WC   metoprolol succinate  50 mg Oral q morning   midodrine  10 mg Oral TID WC   pantoprazole (PROTONIX) IV  40 mg Intravenous QHS      LOS: 2 days   Cherene Altes, MD [facility] Office  [phone] Pager - Text Page per Shea Evans  If 7PM-7AM, please contact night-coverage per Amion 09/03/2021, 5:22 PM

## 2021-09-04 DIAGNOSIS — N179 Acute kidney failure, unspecified: Secondary | ICD-10-CM

## 2021-09-04 DIAGNOSIS — B169 Acute hepatitis B without delta-agent and without hepatic coma: Secondary | ICD-10-CM | POA: Diagnosis not present

## 2021-09-04 DIAGNOSIS — K769 Liver disease, unspecified: Secondary | ICD-10-CM | POA: Diagnosis not present

## 2021-09-04 DIAGNOSIS — R7989 Other specified abnormal findings of blood chemistry: Secondary | ICD-10-CM | POA: Diagnosis not present

## 2021-09-04 LAB — PROTIME-INR
INR: 1.1 (ref 0.8–1.2)
Prothrombin Time: 13.9 seconds (ref 11.4–15.2)

## 2021-09-04 LAB — COMPREHENSIVE METABOLIC PANEL
ALT: 1416 U/L — ABNORMAL HIGH (ref 0–44)
AST: 1168 U/L — ABNORMAL HIGH (ref 15–41)
Albumin: 3.1 g/dL — ABNORMAL LOW (ref 3.5–5.0)
Alkaline Phosphatase: 116 U/L (ref 38–126)
Anion gap: 9 (ref 5–15)
BUN: 22 mg/dL — ABNORMAL HIGH (ref 6–20)
CO2: 26 mmol/L (ref 22–32)
Calcium: 8.7 mg/dL — ABNORMAL LOW (ref 8.9–10.3)
Chloride: 102 mmol/L (ref 98–111)
Creatinine, Ser: 1.97 mg/dL — ABNORMAL HIGH (ref 0.44–1.00)
GFR, Estimated: 31 mL/min — ABNORMAL LOW (ref >60)
Glucose, Bld: 115 mg/dL — ABNORMAL HIGH (ref 70–99)
Potassium: 3.8 mmol/L (ref 3.5–5.1)
Sodium: 137 mmol/L (ref 135–145)
Total Bilirubin: 16 mg/dL — ABNORMAL HIGH (ref 0.3–1.2)
Total Protein: 6.5 g/dL (ref 6.5–8.1)

## 2021-09-04 LAB — HEPATITIS B DNA, ULTRAQUANTITATIVE, PCR
HBV DNA SERPL PCR-ACNC: 369000 IU/mL
HBV DNA SERPL PCR-LOG IU: 5.567 log10 IU/mL

## 2021-09-04 LAB — CBC
HCT: 39.3 % (ref 36.0–46.0)
Hemoglobin: 12.6 g/dL (ref 12.0–15.0)
MCH: 27.3 pg (ref 26.0–34.0)
MCHC: 32.1 g/dL (ref 30.0–36.0)
MCV: 85.2 fL (ref 80.0–100.0)
Platelets: 144 10*3/uL — ABNORMAL LOW (ref 150–400)
RBC: 4.61 MIL/uL (ref 3.87–5.11)
RDW: 19.9 % — ABNORMAL HIGH (ref 11.5–15.5)
WBC: 5.6 10*3/uL (ref 4.0–10.5)
nRBC: 0 % (ref 0.0–0.2)

## 2021-09-04 LAB — GLUCOSE, CAPILLARY
Glucose-Capillary: 110 mg/dL — ABNORMAL HIGH (ref 70–99)
Glucose-Capillary: 118 mg/dL — ABNORMAL HIGH (ref 70–99)
Glucose-Capillary: 113 mg/dL — ABNORMAL HIGH (ref 70–99)
Glucose-Capillary: 113 mg/dL — ABNORMAL HIGH (ref 70–99)

## 2021-09-04 LAB — MAGNESIUM: Magnesium: 2.1 mg/dL (ref 1.7–2.4)

## 2021-09-04 LAB — PHOSPHORUS: Phosphorus: 3.3 mg/dL (ref 2.5–4.6)

## 2021-09-04 MED ORDER — FENTANYL CITRATE PF 50 MCG/ML IJ SOSY
12.5000 ug | PREFILLED_SYRINGE | INTRAMUSCULAR | Status: DC | PRN
  Administered 2021-09-04 – 2021-09-10 (×26): 25 ug via INTRAVENOUS
  Filled 2021-09-04 (×26): qty 1

## 2021-09-04 MED ORDER — OXYCODONE HCL 5 MG PO TABS
5.0000 mg | ORAL_TABLET | ORAL | Status: DC | PRN
  Administered 2021-09-04 – 2021-09-10 (×10): 5 mg via ORAL
  Filled 2021-09-04 (×10): qty 1

## 2021-09-04 MED ORDER — MIDODRINE HCL 5 MG PO TABS
5.0000 mg | ORAL_TABLET | Freq: Three times a day (TID) | ORAL | Status: AC
  Administered 2021-09-04 (×2): 5 mg via ORAL
  Filled 2021-09-04 (×3): qty 1

## 2021-09-04 NOTE — Progress Notes (Signed)
Subjective: Patient states she she is mildly improved today.  Continues to have epigastric pain, some nausea.  Renal function improved today.  Objective: Vital signs in last 24 hours: Temp:  [98.9 F (37.2 C)-99.6 F (37.6 C)] 99.6 F (37.6 C) (09/11 0649) Pulse Rate:  [61-67] 67 (09/11 0649) Resp:  [16-18] 18 (09/11 0649) BP: (155-169)/(59-82) 164/75 (09/11 0649) SpO2:  [91 %-96 %] 96 % (09/11 0829) Last BM Date: 09/03/21 General:   Alert and oriented, pleasant Abdomen:  Bowel sounds present, soft, tender to palpation epigastric region, non-distended. No HSM or hernias noted. No rebound or guarding. No masses appreciated  Msk:  Symmetrical without gross deformities. Normal posture. Extremities:  Without clubbing or edema. Neurologic:  Alert and  oriented x4;  grossly normal neurologically. Skin:  Warm and dry, intact without significant lesions.  Cervical Nodes:  No significant cervical adenopathy. Psych:  Alert and cooperative. Normal mood and affect.  Intake/Output from previous day: 09/10 0701 - 09/11 0700 In: 3958.4 [P.O.:960; I.V.:2898.4; IV Piggyback:100] Out: 500 [Urine:500] Intake/Output this shift: No intake/output data recorded.  Lab Results: Recent Labs    09/02/21 0620 09/03/21 0629 09/04/21 0629  WBC 5.6 5.8 5.6  HGB 13.3 12.9 12.6  HCT 41.9 39.9 39.3  PLT 155 151 144*    BMET Recent Labs    09/02/21 1305 09/03/21 0629 09/04/21 0629  NA 133* 132* 137  K 3.8 3.8 3.8  CL 100 100 102  CO2 '25 26 26  '$ GLUCOSE 99 106* 115*  BUN 24* 26* 22*  CREATININE 2.54* 2.67* 1.97*  CALCIUM 8.5* 8.1* 8.7*    LFT Recent Labs    09/02/21 0620 09/03/21 0629 09/04/21 0629  PROT 6.9 6.5 6.5  ALBUMIN 3.4* 3.2* 3.1*  AST 1,036* 1,210* 1,168*  ALT 1,450* 1,446* 1,416*  ALKPHOS 119 115 116  BILITOT 15.4* 16.2* 16.0*    PT/INR Recent Labs    09/03/21 1853 09/04/21 0826  LABPROT 14.1 13.9  INR 1.1 1.1    Hepatitis Panel No results for input(s):  HEPBSAG, HCVAB, HEPAIGM, HEPBIGM in the last 72 hours.    Studies/Results: US RENAL  Result Date: 09/02/2021 CLINICAL DATA:  Acute renal failure Hypertension EXAM: RENAL / URINARY TRACT ULTRASOUND COMPLETE COMPARISON:  CT abdomen pelvis 11/21/2010 FINDINGS: Right Kidney: Renal measurements: 13.6 x 4.9 x 7.2 cm = volume: 251 mL. Echogenicity within normal limits. No mass or hydronephrosis visualized. Left Kidney: Renal measurements: 13.2 x 7.1 x 6.6 cm = volume: 324 mL. Echogenicity within normal limits. No mass or hydronephrosis visualized. Bladder: Appears normal for degree of bladder distention. Other: None. IMPRESSION: No significant sonographic abnormality of the kidneys. Electronically Signed   By: Miachel Roux M.D.   On: 09/02/2021 13:33    Assessment: *Acute hepatitis B *Abnormal liver function test due to above *Abdominal pain *Gastritis  Plan: Patient presenting with abnormal liver function tests in the setting of acute hepatitis B, surface antigen positive, core IgM positive, Be+, viral load pending.  Fortunately her synthetic function remains intact with INR 1.1.  Discussed hepatitis B in depth with her today.  Approximately 95% of patients will clear infection on their own.  We will continue supportive measures for now.  If she has evidence of synthetic dysfunction, will consider antiviral therapy.  Continue daily LFTs and coags.  Analgesia and antiemetics per hospitalist.  Creatinine improved today.  Abdominal pain possibly due to her acute hepatitis B.  Lipase WNL.  She does have a history of pancreatitis in the  past status post cholecystectomy, ultrasound unremarkable besides fatty liver.  May need to consider cross-sectional imaging though unsure how beneficial this would be without being able to give contrast due to her above acute kidney injury.  GI to continue to follow.  Elon Alas. Abbey Chatters, D.O. Gastroenterology and Hepatology [facility]   LOS: 3 days    09/04/2021, 12:08 PM

## 2021-09-04 NOTE — Progress Notes (Signed)
Progress Note    Shelly Larson   [id number]  DOB: 01-Jan-1971  DOA: 08/31/2021     3  PCP: [facility]  Initial CC:   Hospital Course: 50 year old with a history of hypertension, asthma, colon cancer status post segmental resection 2016, biliary pancreatitis status post cholecystectomy 2010, and DM2 who was told to present to the ED because of abnormal liver enzymes.  She was being evaluated for abdominal pain with nagging nausea by her PCP.  This included blood work which revealed elevated LFTs.  In the ED AST was noted to be 1036, and ALT was noted to be 1476.  Total bilirubin was 13.  RUQ ultrasound noted increased echogenicity of the liver consistent with fatty infiltrate but no other obvious lesions.  Interval History:  No events overnight.  Resting in bed comfortably laying on her left side.  Still has ongoing abdominal pain but has been tolerating fairly well with ongoing pain and nausea control.  ROS: Constitutional: negative for chills and fevers, Respiratory: negative for cough, Cardiovascular: negative for chest pain, and Gastrointestinal: positive for abdominal pain  Assessment & Plan:  Acute idiopathic hepatitis B  - positive for HepB surface Ag and Hep B Core IgM Ab, Hep B E Ag positive  - follow up viral load - per GI given normal synthetic function no indication for antiviral at this time - continue IVF and trending labs - continue pain and nausea control  - EGD done on 9/9, only gastritis noted, no other significant pathologic findings - supportive care for now unless synthetic function becomes affected   AKI - baseline creatinine ~ 0.9 - patient presents with increase in creat >0.3 mg/dL above baseline, creat increase >1.5x baseline presumed to have occurred within past 7 days PTA - no contrast exposure - etiology may be due to an ATN that developed; for now creatinine has peaked at 2.67 and starting to downtrend as of 9/11 - continue IVF and  trending renal function; holding off on nephrology consult for now given improvement - renal u/s also done on 9/9 is negative for acute findings  HTN BP controlled but slightly above goal - she has been on midodrine and this is not a home med; no obvious hypoTN in the past 24 hours; will wean this off and observe BP response    Asthma Well compensated at present -continue usual home inhaled medications   DM2 A1c 6.2 - CBG well controlled   Old records reviewed in assessment of this patient  Antimicrobials:   DVT prophylaxis: heparin injection 5,000 Units Start: 09/02/21 2200 SCDs Start: 08/31/21 2143   Code Status:   Code Status: Full Code Family Communication:   Disposition Plan: Status is: Inpatient  Remains inpatient appropriate because:IV treatments appropriate due to intensity of illness or inability to take PO and Inpatient level of care appropriate due to severity of illness  Dispo: The patient is from: Home              Anticipated d/c is to: Home              Patient currently is not medically stable to d/c.   Difficult to place patient No  Risk of unplanned readmission score: Unplanned Admission- Pilot do not use: 16.34   Objective: Blood pressure (!) 164/75, pulse 67, temperature 99.6 F (37.6 C), temperature source Oral, resp. rate 18, height 6' (1.829 m), weight 122.5 kg, SpO2 96 %.  Examination: General appearance: alert, cooperative, and no distress  Head: Normocephalic, without obvious abnormality, atraumatic Eyes:  EOMI ; obviousl scleral icterus appreciated Lungs: clear to auscultation bilaterally Heart: regular rate and rhythm and S1, S2 normal Abdomen:  BS present; tender to palpation which is worse in epigastrium and right upper quadrant, no rebound or guarding.  Soft, nondistended Extremities:  No lower extremity edema Skin: mobility and turgor normal Neurologic: Grossly normal  Consultants:  GI  Procedures:  EGD, 09/02/2021  Data Reviewed:  I have personally reviewed following labs and imaging studies Results for orders placed or performed during the hospital encounter of 08/31/21 (from the past 24 hour(s))  Glucose, capillary     Status: Abnormal   Collection Time: 09/03/21 11:46 AM  Result Value Ref Range   Glucose-Capillary 121 (H) 70 - 99 mg/dL  Glucose, capillary     Status: None   Collection Time: 09/03/21  4:28 PM  Result Value Ref Range   Glucose-Capillary 88 70 - 99 mg/dL  Protime-INR     Status: None   Collection Time: 09/03/21  6:53 PM  Result Value Ref Range   Prothrombin Time 14.1 11.4 - 15.2 seconds   INR 1.1 0.8 - 1.2  Glucose, capillary     Status: Abnormal   Collection Time: 09/03/21  9:56 PM  Result Value Ref Range   Glucose-Capillary 114 (H) 70 - 99 mg/dL  Comprehensive metabolic panel     Status: Abnormal   Collection Time: 09/04/21  6:29 AM  Result Value Ref Range   Sodium 137 135 - 145 mmol/L   Potassium 3.8 3.5 - 5.1 mmol/L   Chloride 102 98 - 111 mmol/L   CO2 26 22 - 32 mmol/L   Glucose, Bld 115 (H) 70 - 99 mg/dL   BUN 22 (H) 6 - 20 mg/dL   Creatinine, Ser 1.97 (H) 0.44 - 1.00 mg/dL   Calcium 8.7 (L) 8.9 - 10.3 mg/dL   Total Protein 6.5 6.5 - 8.1 g/dL   Albumin 3.1 (L) 3.5 - 5.0 g/dL   AST 1,168 (H) 15 - 41 U/L   ALT 1,416 (H) 0 - 44 U/L   Alkaline Phosphatase 116 38 - 126 U/L   Total Bilirubin 16.0 (H) 0.3 - 1.2 mg/dL   GFR, Estimated 31 (L) >60 mL/min   Anion gap 9 5 - 15  CBC     Status: Abnormal   Collection Time: 09/04/21  6:29 AM  Result Value Ref Range   WBC 5.6 4.0 - 10.5 K/uL   RBC 4.61 3.87 - 5.11 MIL/uL   Hemoglobin 12.6 12.0 - 15.0 g/dL   HCT 39.3 36.0 - 46.0 %   MCV 85.2 80.0 - 100.0 fL   MCH 27.3 26.0 - 34.0 pg   MCHC 32.1 30.0 - 36.0 g/dL   RDW 19.9 (H) 11.5 - 15.5 %   Platelets 144 (L) 150 - 400 K/uL   nRBC 0.0 0.0 - 0.2 %  Phosphorus     Status: None   Collection Time: 09/04/21  6:29 AM  Result Value Ref Range   Phosphorus 3.3 2.5 - 4.6 mg/dL  Magnesium      Status: None   Collection Time: 09/04/21  6:29 AM  Result Value Ref Range   Magnesium 2.1 1.7 - 2.4 mg/dL  Glucose, capillary     Status: Abnormal   Collection Time: 09/04/21  7:54 AM  Result Value Ref Range   Glucose-Capillary 110 (H) 70 - 99 mg/dL  Protime-INR     Status: None   Collection  Time: 09/04/21  8:26 AM  Result Value Ref Range   Prothrombin Time 13.9 11.4 - 15.2 seconds   INR 1.1 0.8 - 1.2    Recent Results (from the past 240 hour(s))  Resp Panel by RT-PCR (Flu A&B, Covid) Nasopharyngeal Swab     Status: None   Collection Time: 08/31/21  8:25 PM   Specimen: Nasopharyngeal Swab; Nasopharyngeal(NP) swabs in vial transport medium  Result Value Ref Range Status   SARS Coronavirus 2 by RT PCR NEGATIVE NEGATIVE Final    Comment: (NOTE) SARS-CoV-2 target nucleic acids are NOT DETECTED.  The SARS-CoV-2 RNA is generally detectable in upper respiratory specimens during the acute phase of infection. The lowest concentration of SARS-CoV-2 viral copies this assay can detect is 138 copies/mL. A negative result does not preclude SARS-Cov-2 infection and should not be used as the sole basis for treatment or other patient management decisions. A negative result may occur with  improper specimen collection/handling, submission of specimen other than nasopharyngeal swab, presence of viral mutation(s) within the areas targeted by this assay, and inadequate number of viral copies(<138 copies/mL). A negative result must be combined with clinical observations, patient history, and epidemiological information. The expected result is Negative.  Fact Sheet for Patients:  EntrepreneurPulse.com.au  Fact Sheet for Healthcare Providers:  IncredibleEmployment.be  This test is no t yet approved or cleared by the [place] FDA and  has been authorized for detection and/or diagnosis of SARS-CoV-2 by FDA under an Emergency Use Authorization (EUA). This EUA  will remain  in effect (meaning this test can be used) for the duration of the COVID-19 declaration under Section 564(b)(1) of the Act, 21 U.S.C.section 360bbb-3(b)(1), unless the authorization is terminated  or revoked sooner.       Influenza A by PCR NEGATIVE NEGATIVE Final   Influenza B by PCR NEGATIVE NEGATIVE Final    Comment: (NOTE) The Xpert Xpress SARS-CoV-2/FLU/RSV plus assay is intended as an aid in the diagnosis of influenza from Nasopharyngeal swab specimens and should not be used as a sole basis for treatment. Nasal washings and aspirates are unacceptable for Xpert Xpress SARS-CoV-2/FLU/RSV testing.  Fact Sheet for Patients: EntrepreneurPulse.com.au  Fact Sheet for Healthcare Providers: IncredibleEmployment.be  This test is not yet approved or cleared by the [place] FDA and has been authorized for detection and/or diagnosis of SARS-CoV-2 by FDA under an Emergency Use Authorization (EUA). This EUA will remain in effect (meaning this test can be used) for the duration of the COVID-19 declaration under Section 564(b)(1) of the Act, 21 U.S.C. section 360bbb-3(b)(1), unless the authorization is terminated or revoked.  Performed at [facility], [address] [place], [place] [address]      Radiology Studies: US RENAL  Result Date: 09/02/2021 CLINICAL DATA:  Acute renal failure Hypertension EXAM: RENAL / URINARY TRACT ULTRASOUND COMPLETE COMPARISON:  CT abdomen pelvis 11/21/2010 FINDINGS: Right Kidney: Renal measurements: 13.6 x 4.9 x 7.2 cm = volume: 251 mL. Echogenicity within normal limits. No mass or hydronephrosis visualized. Left Kidney: Renal measurements: 13.2 x 7.1 x 6.6 cm = volume: 324 mL. Echogenicity within normal limits. No mass or hydronephrosis visualized. Bladder: Appears normal for degree of bladder distention. Other: None. IMPRESSION: No significant sonographic abnormality of the kidneys. Electronically Signed    By: Miachel Roux M.D.   On: 09/02/2021 13:33   US RENAL  Final Result    US Abdomen Limited RUQ (LIVER/GB)  Final Result  Addendum (preliminary) 1 of 1  ADDENDUM REPORT: 09/01/2021  12:16    ADDENDUM:  Dictation error occurred in the finding section of the report.  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      Electronically Signed    By: Dahlia Bailiff M.D.    On: 09/01/2021 12:16      Final      Scheduled Meds:  fluticasone furoate-vilanterol  1 puff Inhalation q morning   heparin  5,000 Units Subcutaneous Q8H   influenza vac split quadrivalent PF  0.5 mL Intramuscular Tomorrow-1000   insulin aspart  0-5 Units Subcutaneous QHS   insulin aspart  0-9 Units Subcutaneous TID WC   metoprolol succinate  50 mg Oral q morning   midodrine  5 mg Oral TID WC   pantoprazole (PROTONIX) IV  40 mg Intravenous QHS   PRN Meds: albuterol, fentaNYL (SUBLIMAZE) injection, ondansetron (ZOFRAN) IV, oxyCODONE, promethazine (PHENERGAN) injection (IM or IVPB) Continuous Infusions:  sodium chloride 125 mL/hr at 09/04/21 0615   promethazine (PHENERGAN) injection (IM or IVPB) 6.25 mg (09/04/21 0941)     LOS: 3 days  Time spent: Greater than 50% of the 35 minute visit was spent in counseling/coordination of care for the patient as laid out in the A&P.   Dwyane Dee, MD [facility] 09/04/2021, 10:57 AM

## 2021-09-04 NOTE — Plan of Care (Signed)
Pain much better managed tonight with Fentanyl. Ayesha Mohair BSN RN CMSRN  Problem: Pain Managment: Goal: General experience of comfort will improve Outcome: Progressing

## 2021-09-05 ENCOUNTER — Encounter (HOSPITAL_COMMUNITY): Payer: Self-pay | Admitting: Internal Medicine

## 2021-09-05 DIAGNOSIS — B169 Acute hepatitis B without delta-agent and without hepatic coma: Secondary | ICD-10-CM | POA: Diagnosis not present

## 2021-09-05 DIAGNOSIS — R1010 Upper abdominal pain, unspecified: Secondary | ICD-10-CM | POA: Diagnosis not present

## 2021-09-05 LAB — COMPREHENSIVE METABOLIC PANEL
ALT: 1421 U/L — ABNORMAL HIGH (ref 0–44)
AST: 1110 U/L — ABNORMAL HIGH (ref 15–41)
Albumin: 3.1 g/dL — ABNORMAL LOW (ref 3.5–5.0)
Alkaline Phosphatase: 122 U/L (ref 38–126)
Anion gap: 9 (ref 5–15)
BUN: 15 mg/dL (ref 6–20)
CO2: 26 mmol/L (ref 22–32)
Calcium: 8.7 mg/dL — ABNORMAL LOW (ref 8.9–10.3)
Chloride: 101 mmol/L (ref 98–111)
Creatinine, Ser: 1.42 mg/dL — ABNORMAL HIGH (ref 0.44–1.00)
GFR, Estimated: 46 mL/min — ABNORMAL LOW (ref >60)
Glucose, Bld: 103 mg/dL — ABNORMAL HIGH (ref 70–99)
Potassium: 3.6 mmol/L (ref 3.5–5.1)
Sodium: 136 mmol/L (ref 135–145)
Total Bilirubin: 15.3 mg/dL — ABNORMAL HIGH (ref 0.3–1.2)
Total Protein: 6.4 g/dL — ABNORMAL LOW (ref 6.5–8.1)

## 2021-09-05 LAB — CBC WITH DIFFERENTIAL/PLATELET
Abs Immature Granulocytes: 0.09 10*3/uL — ABNORMAL HIGH (ref 0.00–0.07)
Basophils Absolute: 0.1 10*3/uL (ref 0.0–0.1)
Basophils Relative: 1 %
Eosinophils Absolute: 0.4 10*3/uL (ref 0.0–0.5)
Eosinophils Relative: 6 %
HCT: 38.8 % (ref 36.0–46.0)
Hemoglobin: 12.7 g/dL (ref 12.0–15.0)
Immature Granulocytes: 2 %
Lymphocytes Relative: 17 %
Lymphs Abs: 1 10*3/uL (ref 0.7–4.0)
MCH: 27.9 pg (ref 26.0–34.0)
MCHC: 32.7 g/dL (ref 30.0–36.0)
MCV: 85.1 fL (ref 80.0–100.0)
Monocytes Absolute: 0.9 10*3/uL (ref 0.1–1.0)
Monocytes Relative: 16 %
Neutro Abs: 3.5 10*3/uL (ref 1.7–7.7)
Neutrophils Relative %: 58 %
Platelets: 152 10*3/uL (ref 150–400)
RBC: 4.56 MIL/uL (ref 3.87–5.11)
RDW: 20.2 % — ABNORMAL HIGH (ref 11.5–15.5)
WBC: 5.9 10*3/uL (ref 4.0–10.5)
nRBC: 0 % (ref 0.0–0.2)

## 2021-09-05 LAB — PROTIME-INR
INR: 1.1 (ref 0.8–1.2)
Prothrombin Time: 14.2 seconds (ref 11.4–15.2)

## 2021-09-05 LAB — HEPATITIS B E ANTIBODY: Hep B E Ab: NEGATIVE

## 2021-09-05 LAB — GLUCOSE, CAPILLARY
Glucose-Capillary: 95 mg/dL (ref 70–99)
Glucose-Capillary: 106 mg/dL — ABNORMAL HIGH (ref 70–99)
Glucose-Capillary: 124 mg/dL — ABNORMAL HIGH (ref 70–99)
Glucose-Capillary: 119 mg/dL — ABNORMAL HIGH (ref 70–99)

## 2021-09-05 LAB — MAGNESIUM: Magnesium: 1.9 mg/dL (ref 1.7–2.4)

## 2021-09-05 LAB — SURGICAL PATHOLOGY

## 2021-09-05 MED ORDER — PROMETHAZINE HCL 25 MG/ML IJ SOLN
INTRAMUSCULAR | Status: AC
  Filled 2021-09-05: qty 1

## 2021-09-05 MED ORDER — AMLODIPINE BESYLATE 5 MG PO TABS
10.0000 mg | ORAL_TABLET | Freq: Every day | ORAL | Status: DC
  Administered 2021-09-05 – 2021-09-11 (×7): 10 mg via ORAL
  Filled 2021-09-05 (×7): qty 2

## 2021-09-05 NOTE — Progress Notes (Signed)
Progress Note    Shelly Larson   [id number]  DOB: [birth year]  DOA: 08/31/2021     4  PCP: [facility]  Initial CC: abdominal pain   Hospital Course: 50 year old with a history of hypertension, asthma, colon cancer status post segmental resection 2016, biliary pancreatitis status post cholecystectomy 2010 referred to emergency department with abnormal liver enzymes.  She was being evaluated for abdominal pain with nausea by her PCP.  This included blood work which revealed elevated LFTs.  In the ED AST was noted to be 1036, and ALT was noted to be 1476.  Total bilirubin was 13.  RUQ ultrasound noted increased echogenicity of the liver consistent with fatty infiltrate but no other obvious lesions.  Interval History:  Continues to have crampy abdominal pain and nausea.  She was however able to eat some food today morning.  No vomiting.  No bowel movement since last 2 days. Blood pressures reported high.  Antihypertensives are on hold.  ROS: Constitutional: negative for chills and fevers, Respiratory: negative for cough, Cardiovascular: negative for chest pain, and Gastrointestinal: positive for abdominal pain  Assessment & Plan:  Acute hepatitis B infection. - positive for HepB surface Ag and Hep B Core IgM Ab, Hep B E Ag positive  -Viral load 300,000. - per GI given normal synthetic function no indication for antiviral at this time - continue IVF and trending labs, symptomatic treatment. - continue pain and nausea control  - EGD done on 9/9, only gastritis noted, no other significant pathologic findings - supportive care for now unless synthetic function becomes affected   AKI - baseline creatinine ~ 0.9 - patient presents with increase in creat >0.3 mg/dL above baseline, creat increase  - no contrast exposure - etiology may be due to an ATN that developed; for now creatinine has peaked at 2.67 and starting to downtrend.  Normalizing. - renal u/s also done on  9/9 is negative for acute findings  HTN BP uncontrolled today.   Resume metoprolol. Resume amlodipine 10 mg daily today.  Holding losartan hydrochlorothiazide because of acute renal failure. Will add as needed hydralazine.   Asthma Well compensated at present -continue usual home inhaled medications   DM2 A1c 6.2 - CBG well controlled .  On metformin at home.   Antimicrobials: None.   DVT prophylaxis: heparin injection 5,000 Units Start: 09/02/21 2200 SCDs Start: 08/31/21 2143   Code Status:   Code Status: Full Code Family Communication: None.  Disposition Plan: Status is: Inpatient  Remains inpatient appropriate because:IV treatments appropriate due to intensity of illness or inability to take PO and Inpatient level of care appropriate due to severity of illness  Dispo: The patient is from: Home              Anticipated d/c is to: Home              Patient currently is not medically stable to d/c.   Difficult to place patient No   Objective: Blood pressure (!) 199/103, pulse 70, temperature 98.8 F (37.1 C), temperature source Oral, resp. rate 17, height 6' (1.829 m), weight 122.5 kg, SpO2 91 %.  Examination: General: Patient is in moderate distress with acute abdominal pain.  Otherwise on room air.  Comfortable after pain medications. Icteric on exam. Cardiovascular: S1-S2 normal.  Regular rate rhythm. Respiratory: Bilateral clear. Gastrointestinal: Soft.  Diffuse tenderness in the epigastrium and resistant to exam but no rigidity or guarding. Ext: No swelling or edema.  No  cyanosis. Neuro: Alert oriented x4.  No neurological deficits.   Consultants:  GI  Procedures:  EGD, 09/02/2021  Data Reviewed: I have personally reviewed following labs and imaging studies Results for orders placed or performed during the hospital encounter of 08/31/21 (from the past 24 hour(s))  Glucose, capillary     Status: Abnormal   Collection Time: 09/04/21 12:01 PM  Result Value  Ref Range   Glucose-Capillary 118 (H) 70 - 99 mg/dL  Glucose, capillary     Status: Abnormal   Collection Time: 09/04/21  4:36 PM  Result Value Ref Range   Glucose-Capillary 113 (H) 70 - 99 mg/dL  Glucose, capillary     Status: Abnormal   Collection Time: 09/04/21  9:01 PM  Result Value Ref Range   Glucose-Capillary 113 (H) 70 - 99 mg/dL   Comment 1 Notify RN    Comment 2 Document in Chart   CBC with Differential/Platelet     Status: Abnormal   Collection Time: 09/05/21  6:42 AM  Result Value Ref Range   WBC 5.9 4.0 - 10.5 K/uL   RBC 4.56 3.87 - 5.11 MIL/uL   Hemoglobin 12.7 12.0 - 15.0 g/dL   HCT 38.8 36.0 - 46.0 %   MCV 85.1 80.0 - 100.0 fL   MCH 27.9 26.0 - 34.0 pg   MCHC 32.7 30.0 - 36.0 g/dL   RDW 20.2 (H) 11.5 - 15.5 %   Platelets 152 150 - 400 K/uL   nRBC 0.0 0.0 - 0.2 %   Neutrophils Relative % 58 %   Neutro Abs 3.5 1.7 - 7.7 K/uL   Lymphocytes Relative 17 %   Lymphs Abs 1.0 0.7 - 4.0 K/uL   Monocytes Relative 16 %   Monocytes Absolute 0.9 0.1 - 1.0 K/uL   Eosinophils Relative 6 %   Eosinophils Absolute 0.4 0.0 - 0.5 K/uL   Basophils Relative 1 %   Basophils Absolute 0.1 0.0 - 0.1 K/uL   Immature Granulocytes 2 %   Abs Immature Granulocytes 0.09 (H) 0.00 - 0.07 K/uL  Comprehensive metabolic panel     Status: Abnormal   Collection Time: 09/05/21  6:42 AM  Result Value Ref Range   Sodium 136 135 - 145 mmol/L   Potassium 3.6 3.5 - 5.1 mmol/L   Chloride 101 98 - 111 mmol/L   CO2 26 22 - 32 mmol/L   Glucose, Bld 103 (H) 70 - 99 mg/dL   BUN 15 6 - 20 mg/dL   Creatinine, Ser 1.42 (H) 0.44 - 1.00 mg/dL   Calcium 8.7 (L) 8.9 - 10.3 mg/dL   Total Protein 6.4 (L) 6.5 - 8.1 g/dL   Albumin 3.1 (L) 3.5 - 5.0 g/dL   AST 1,110 (H) 15 - 41 U/L   ALT 1,421 (H) 0 - 44 U/L   Alkaline Phosphatase 122 38 - 126 U/L   Total Bilirubin 15.3 (H) 0.3 - 1.2 mg/dL   GFR, Estimated 46 (L) >60 mL/min   Anion gap 9 5 - 15  Magnesium     Status: None   Collection Time: 09/05/21  6:42  AM  Result Value Ref Range   Magnesium 1.9 1.7 - 2.4 mg/dL  Protime-INR     Status: None   Collection Time: 09/05/21  6:42 AM  Result Value Ref Range   Prothrombin Time 14.2 11.4 - 15.2 seconds   INR 1.1 0.8 - 1.2  Glucose, capillary     Status: None   Collection Time: 09/05/21  7:58  AM  Result Value Ref Range   Glucose-Capillary 95 70 - 99 mg/dL    Recent Results (from the past 240 hour(s))  Resp Panel by RT-PCR (Flu A&B, Covid) Nasopharyngeal Swab     Status: None   Collection Time: 08/31/21  8:25 PM   Specimen: Nasopharyngeal Swab; Nasopharyngeal(NP) swabs in vial transport medium  Result Value Ref Range Status   SARS Coronavirus 2 by RT PCR NEGATIVE NEGATIVE Final    Comment: (NOTE) SARS-CoV-2 target nucleic acids are NOT DETECTED.  The SARS-CoV-2 RNA is generally detectable in upper respiratory specimens during the acute phase of infection. The lowest concentration of SARS-CoV-2 viral copies this assay can detect is 138 copies/mL. A negative result does not preclude SARS-Cov-2 infection and should not be used as the sole basis for treatment or other patient management decisions. A negative result may occur with  improper specimen collection/handling, submission of specimen other than nasopharyngeal swab, presence of viral mutation(s) within the areas targeted by this assay, and inadequate number of viral copies(<138 copies/mL). A negative result must be combined with clinical observations, patient history, and epidemiological information. The expected result is Negative.  Fact Sheet for Patients:  EntrepreneurPulse.com.au  Fact Sheet for Healthcare Providers:  IncredibleEmployment.be  This test is no t yet approved or cleared by the [place] FDA and  has been authorized for detection and/or diagnosis of SARS-CoV-2 by FDA under an Emergency Use Authorization (EUA). This EUA will remain  in effect (meaning this test can be used)  for the duration of the COVID-19 declaration under Section 564(b)(1) of the Act, 21 U.S.C.section 360bbb-3(b)(1), unless the authorization is terminated  or revoked sooner.       Influenza A by PCR NEGATIVE NEGATIVE Final   Influenza B by PCR NEGATIVE NEGATIVE Final    Comment: (NOTE) The Xpert Xpress SARS-CoV-2/FLU/RSV plus assay is intended as an aid in the diagnosis of influenza from Nasopharyngeal swab specimens and should not be used as a sole basis for treatment. Nasal washings and aspirates are unacceptable for Xpert Xpress SARS-CoV-2/FLU/RSV testing.  Fact Sheet for Patients: EntrepreneurPulse.com.au  Fact Sheet for Healthcare Providers: IncredibleEmployment.be  This test is not yet approved or cleared by the [place] FDA and has been authorized for detection and/or diagnosis of SARS-CoV-2 by FDA under an Emergency Use Authorization (EUA). This EUA will remain in effect (meaning this test can be used) for the duration of the COVID-19 declaration under Section 564(b)(1) of the Act, 21 U.S.C. section 360bbb-3(b)(1), unless the authorization is terminated or revoked.  Performed at [facility], [address], [place], [place] [address]      Radiology Studies: No results found. US RENAL  Final Result    US Abdomen Limited RUQ (LIVER/GB)  Final Result  Addendum (preliminary) 1 of 1  ADDENDUM REPORT: 09/01/2021 12:16    ADDENDUM:  Dictation error occurred in the finding section of the report.  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      Electronically Signed    By: Dahlia Bailiff M.D.    On: 09/01/2021 12:16      Final      Scheduled Meds:  amLODipine  10 mg Oral Daily   fluticasone furoate-vilanterol  1 puff Inhalation q morning   heparin  5,000 Units Subcutaneous Q8H   influenza vac split quadrivalent PF  0.5 mL Intramuscular Tomorrow-1000   insulin aspart  0-5 Units Subcutaneous QHS    insulin aspart  0-9 Units Subcutaneous TID WC  metoprolol succinate  50 mg Oral q morning   midodrine  5 mg Oral TID WC   pantoprazole (PROTONIX) IV  40 mg Intravenous QHS   PRN Meds: albuterol, fentaNYL (SUBLIMAZE) injection, ondansetron (ZOFRAN) IV, oxyCODONE, promethazine (PHENERGAN) injection (IM or IVPB) Continuous Infusions:  sodium chloride 125 mL/hr at 09/05/21 0647   promethazine (PHENERGAN) injection (IM or IVPB) Stopped (09/05/21 0535)     LOS: 4 days  Time spent: 30 minutes.  Barb Merino, MD [facility] 09/05/2021, 11:02 AM

## 2021-09-05 NOTE — Progress Notes (Signed)
Subjective: Patient states she continues to improve slightly.  Continues to have epigastric pain, some nausea.  Renal function improved today.  Objective: Vital signs in last 24 hours: Temp:  [98.4 F (36.9 C)-98.8 F (37.1 C)] 98.8 F (37.1 C) (09/12 0616) Pulse Rate:  [60-70] 70 (09/12 1000) Resp:  [17-18] 17 (09/12 0616) BP: (145-199)/(72-103) 145/93 (09/12 1134) SpO2:  [91 %-95 %] 91 % (09/12 0616) Last BM Date: 09/03/21 General:   Alert and oriented, pleasant Abdomen:  Bowel sounds present, soft, tender to palpation epigastric region, non-distended. No HSM or hernias noted. No rebound or guarding. No masses appreciated  Msk:  Symmetrical without gross deformities. Normal posture. Extremities:  Without clubbing or edema. Neurologic:  Alert and  oriented x4;  grossly normal neurologically. Skin:  Warm and dry, intact without significant lesions.  Cervical Nodes:  No significant cervical adenopathy. Psych:  Alert and cooperative. Normal mood and affect.  Intake/Output from previous day: 09/11 0701 - 09/12 0700 In: 4338.7 [P.O.:1320; I.V.:2868.7; IV Piggyback:150] Out: 2200 [Urine:2200] Intake/Output this shift: Total I/O In: 592 [P.O.:592] Out: 500 [Urine:500]  Lab Results: Recent Labs    09/03/21 0629 09/04/21 0629 09/05/21 0642  WBC 5.8 5.6 5.9  HGB 12.9 12.6 12.7  HCT 39.9 39.3 38.8  PLT 151 144* 152    BMET Recent Labs    09/03/21 0629 09/04/21 0629 09/05/21 0642  NA 132* 137 136  K 3.8 3.8 3.6  CL 100 102 101  CO2 '26 26 26  '$ GLUCOSE 106* 115* 103*  BUN 26* 22* 15  CREATININE 2.67* 1.97* 1.42*  CALCIUM 8.1* 8.7* 8.7*    LFT Recent Labs    09/03/21 0629 09/04/21 0629 09/05/21 0642  PROT 6.5 6.5 6.4*  ALBUMIN 3.2* 3.1* 3.1*  AST 1,210* 1,168* 1,110*  ALT 1,446* 1,416* 1,421*  ALKPHOS 115 116 122  BILITOT 16.2* 16.0* 15.3*    PT/INR Recent Labs    09/04/21 0826 09/05/21 0642  LABPROT 13.9 14.2  INR 1.1 1.1    Hepatitis Panel No  results for input(s): HEPBSAG, HCVAB, HEPAIGM, HEPBIGM in the last 72 hours.    Studies/Results: No results found.  Assessment: *Acute hepatitis B *Abnormal liver function test due to above *Abdominal pain *Gastritis  Plan: Patient presenting with abnormal liver function tests in the setting of acute hepatitis B, surface antigen positive, core IgM positive, Be+, viral load 369k.  Fortunately her synthetic function remains intact with INR 1.1.  Discussed hepatitis B in depth with her today.  Approximately 95% of patients will clear infection on their own.  We will continue supportive measures for now.  If she has evidence of synthetic dysfunction or prolonged hyperbilirubinemia greater than 4 weeks, will consider antiviral therapy.  Continue daily LFTs and coags.  Analgesia and antiemetics per hospitalist.  Creatinine continues to improve  Abdominal pain possibly due to her acute hepatitis B though severity and tenderness on exam is worrisome for other causes.  Lipase WNL.  She does have a history of pancreatitis in the past status post cholecystectomy, ultrasound unremarkable besides fatty liver.    EGD 09/02/2021 with gastritis, biopsies pending.  May need to consider cross-sectional imaging.  Discussed with hospitalist, if not improved tomorrow, then we will proceed.  Hopefully her renal function continues to improve and we can potentially use IV contrast.  GI to continue to follow.  Elon Alas. Abbey Chatters, D.O. Gastroenterology and Hepatology [facility]   LOS: 4 days    09/05/2021, 1:00 PM

## 2021-09-06 ENCOUNTER — Inpatient Hospital Stay (HOSPITAL_COMMUNITY): Payer: 59

## 2021-09-06 DIAGNOSIS — R894 Abnormal immunological findings in specimens from other organs, systems and tissues: Secondary | ICD-10-CM

## 2021-09-06 DIAGNOSIS — B169 Acute hepatitis B without delta-agent and without hepatic coma: Secondary | ICD-10-CM | POA: Diagnosis not present

## 2021-09-06 DIAGNOSIS — R1010 Upper abdominal pain, unspecified: Secondary | ICD-10-CM | POA: Diagnosis not present

## 2021-09-06 DIAGNOSIS — R748 Abnormal levels of other serum enzymes: Secondary | ICD-10-CM | POA: Diagnosis not present

## 2021-09-06 LAB — COMPREHENSIVE METABOLIC PANEL
ALT: 1312 U/L — ABNORMAL HIGH (ref 0–44)
AST: 873 U/L — ABNORMAL HIGH (ref 15–41)
Albumin: 3.3 g/dL — ABNORMAL LOW (ref 3.5–5.0)
Alkaline Phosphatase: 131 U/L — ABNORMAL HIGH (ref 38–126)
Anion gap: 10 (ref 5–15)
BUN: 10 mg/dL (ref 6–20)
CO2: 30 mmol/L (ref 22–32)
Calcium: 9.1 mg/dL (ref 8.9–10.3)
Chloride: 99 mmol/L (ref 98–111)
Creatinine, Ser: 1.1 mg/dL — ABNORMAL HIGH (ref 0.44–1.00)
GFR, Estimated: >60 mL/min (ref >60)
Glucose, Bld: 112 mg/dL — ABNORMAL HIGH (ref 70–99)
Potassium: 3.8 mmol/L (ref 3.5–5.1)
Sodium: 139 mmol/L (ref 135–145)
Total Bilirubin: 16.2 mg/dL — ABNORMAL HIGH (ref 0.3–1.2)
Total Protein: 6.9 g/dL (ref 6.5–8.1)

## 2021-09-06 LAB — CBC WITH DIFFERENTIAL/PLATELET
Abs Immature Granulocytes: 0.1 10*3/uL — ABNORMAL HIGH (ref 0.00–0.07)
Basophils Absolute: 0.1 10*3/uL (ref 0.0–0.1)
Basophils Relative: 1 %
Eosinophils Absolute: 0.4 10*3/uL (ref 0.0–0.5)
Eosinophils Relative: 6 %
HCT: 40.6 % (ref 36.0–46.0)
Hemoglobin: 13.1 g/dL (ref 12.0–15.0)
Immature Granulocytes: 2 %
Lymphocytes Relative: 17 %
Lymphs Abs: 1.1 10*3/uL (ref 0.7–4.0)
MCH: 27.6 pg (ref 26.0–34.0)
MCHC: 32.3 g/dL (ref 30.0–36.0)
MCV: 85.7 fL (ref 80.0–100.0)
Monocytes Absolute: 0.8 10*3/uL (ref 0.1–1.0)
Monocytes Relative: 13 %
Neutro Abs: 3.9 10*3/uL (ref 1.7–7.7)
Neutrophils Relative %: 61 %
Platelets: 169 10*3/uL (ref 150–400)
RBC: 4.74 MIL/uL (ref 3.87–5.11)
RDW: 20.8 % — ABNORMAL HIGH (ref 11.5–15.5)
WBC: 6.4 10*3/uL (ref 4.0–10.5)
nRBC: 0 % (ref 0.0–0.2)

## 2021-09-06 LAB — PROTIME-INR
INR: 1 (ref 0.8–1.2)
Prothrombin Time: 13.6 seconds (ref 11.4–15.2)

## 2021-09-06 LAB — GLUCOSE, CAPILLARY
Glucose-Capillary: 107 mg/dL — ABNORMAL HIGH (ref 70–99)
Glucose-Capillary: 120 mg/dL — ABNORMAL HIGH (ref 70–99)
Glucose-Capillary: 126 mg/dL — ABNORMAL HIGH (ref 70–99)
Glucose-Capillary: 127 mg/dL — ABNORMAL HIGH (ref 70–99)

## 2021-09-06 LAB — MAGNESIUM: Magnesium: 1.5 mg/dL — ABNORMAL LOW (ref 1.7–2.4)

## 2021-09-06 MED ORDER — IOHEXOL 350 MG/ML SOLN
80.0000 mL | Freq: Once | INTRAVENOUS | Status: AC | PRN
  Administered 2021-09-06: 80 mL via INTRAVENOUS

## 2021-09-06 MED ORDER — MAGNESIUM SULFATE 2 GM/50ML IV SOLN
2.0000 g | Freq: Once | INTRAVENOUS | Status: AC
  Administered 2021-09-06: 2 g via INTRAVENOUS
  Filled 2021-09-06: qty 50

## 2021-09-06 NOTE — Progress Notes (Signed)
Subjective: Continues with abdominal pain. Primarily in the epigastric are, radiating down to umbilicus and to the right and left. Worsens intermittently. This morning, worsened with eating. Associated nausea with vomiting. She has had nausea daily all day. Small BM last night. No brbpr or melena.   No confusion. Mild fatigue.   Objective: Vital signs in last 24 hours: Temp:  [98.7 F (37.1 C)-99.3 F (37.4 C)] 98.7 F (37.1 C) (09/13 0556) Pulse Rate:  [69-75] 75 (09/13 0849) Resp:  [16-20] 16 (09/13 0556) BP: (145-184)/(82-98) 173/93 (09/13 0849) SpO2:  [90 %-95 %] 90 % (09/13 0810) Last BM Date: 09/04/21 General:   Alert and oriented, pleasant, NAD Head:  Normocephalic and atraumatic. Eyes:  +scleral icterus  Abdomen:  Bowel sounds present, soft, non-distended. Moderate TTP in epigastric area, mild TT in RUQ and RLQ. No HSM or hernias noted. No rebound or guarding. No masses appreciated. Msk:  Symmetrical without gross deformities. Normal posture. Extremities:  Without edema. Neurologic:  Alert and  oriented x4;  grossly normal neurologically. Psych:  Normal mood and affect.  Intake/Output from previous day: 09/12 0701 - 09/13 0700 In: 1944.3 [P.O.:832; I.V.:1112.3] Out: 3800 [Urine:3800] Intake/Output this shift: No intake/output data recorded.  Lab Results: Recent Labs    09/04/21 0629 09/05/21 0642 09/06/21 0646  WBC 5.6 5.9 6.4  HGB 12.6 12.7 13.1  HCT 39.3 38.8 40.6  PLT 144* 152 169   BMET Recent Labs    09/04/21 0629 09/05/21 0642 09/06/21 0646  NA 137 136 139  K 3.8 3.6 3.8  CL 102 101 99  CO2 '26 26 30  '$ GLUCOSE 115* 103* 112*  BUN 22* 15 10  CREATININE 1.97* 1.42* 1.10*  CALCIUM 8.7* 8.7* 9.1   LFT Recent Labs    09/04/21 0629 09/05/21 0642 09/06/21 0646  PROT 6.5 6.4* 6.9  ALBUMIN 3.1* 3.1* 3.3*  AST 1,168* 1,110* 873*  ALT 1,416* 1,421* 1,312*  ALKPHOS 116 122 131*  BILITOT 16.0* 15.3* 16.2*   PT/INR Recent Labs     09/05/21 0642 09/06/21 0920  LABPROT 14.2 13.6  INR 1.1 1.0    Assessment: 50 y.o. female with history of colon cancer status post segmental resection in 2016, diabetes mellitus, hypertension, migraines, remote biliary pancreatitis status postcholecystectomy in 2010 presenting for markedly abnormal LFTs with associated upper abdominal pain and nausea. 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  Regarding acute hepatitis B, her surface antigen was positive, core IgM positive, Be+, viral load 369k.  She is currently undergoing supportive management of hepatitis B.  Antiviral has not yet been initiated. LFTs slowly improving, T bili increasing today to 16.2 from 15.3 yesterday, but stable compared to 3 days ago. Fortunately, her synthetic function remains intact with INR 1.0 today.    Continue supportive measures for now.  Anticipate patient will clear hepatitis B on her own.  If she were to develop synthetic dysfunction or prolonged hyperbilirubinemia greater than 4 weeks, would need to consider antiviral therapy.   As synthetic function has remained normal, we will continue with supportive measures.  I We will go ahead and check for Hepatitis D as well.  Regarding positive HSV, will discuss with Dr. Laural Golden.    Abdominal pain : Continues. Mild improvement since admission, but persistent and waxes and wanes throughout the day.  Sometimes worsened by food, but  not always.  Primarily epigastric but radiates down to umbilicus and bilaterally.  EGD this admission with gastritis, biopsies negative for H. pylori.  Overall, suspect abdominal pain may be related to acute hepatitis, but due to severity of abdominal pain on exam, there is concern for other causes.  Lipase within normal limits. We have been waiting on improvement in kidney function in order to pursue CT A/P with  contrast.  Creatinine improved to 1.1 today; will proceed with CT today.    Plan: CT A/P with oral and IV contrast. 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 Laural Golden.  Continue to monitor LFTs and INR daily. Continue supportive measures.     LOS: 5 days    09/06/2021, 10:37 AM   Shelly Altes, PA-C [facility]

## 2021-09-06 NOTE — Progress Notes (Signed)
Progress Note    Shelly Larson   [id number]  DOB: [birth year]  DOA: 08/31/2021     5  PCP: [facility]  Initial CC: abdominal pain   Hospital Course: 50 year old with a history of hypertension, asthma, colon cancer status post resection in 2016, biliary pancreatitis status post cholecystectomy 2010 referred to emergency department with abnormal liver enzymes.  She was being evaluated for abdominal pain with nausea by her PCP.  This included blood work which revealed elevated LFTs.  In the ED, AST was noted to be 1036, and ALT was noted to be 1476.  Total bilirubin was 13.  RUQ ultrasound noted increased echogenicity of the liver consistent with fatty infiltrate but no other obvious lesions.  Interval History:  Continues to have crampy abdominal pain and nausea, somehow improved in intensity but persists . No vomiting.  No bowel movement since last 2 days.  ROS: Constitutional: negative for chills and fevers, Respiratory: negative for cough, Cardiovascular: negative for chest pain, and Gastrointestinal: positive for abdominal pain and nausea.  Assessment & Plan:  Acute hepatitis B infection. Source of infection unknown.  - positive for HepB surface Ag and Hep B Core IgM Ab, Hep B E Ag positive . Also positive for HSV 1. - Viral load 300,000. - per GI given normal synthetic function no indication for antiviral at this time. - continue IVF and trending labs, symptomatic treatment. - continue pain and nausea control  - EGD done on 9/9, only gastritis noted, no other significant pathologic findings - supportive care for now unless synthetic function becomes affected   AKI - baseline creatinine ~ 0.9 - no contrast exposure - etiology may be due to an ATN that developed; for now creatinine has peaked at 2.67 and starting to downtrend.  Normalizing. - renal u/s also done on 9/9 is negative for acute findings  HTN BP better today after resuming metoprolol and  amlodipine. Holding losartan hydrochlorothiazide because of acute renal failure.   Asthma Well compensated at present -continue usual home inhaled medications.   DM2 A1c 6.2 - CBG well controlled . On metformin at home.  Patient persistent pain.  Plan for CT scan abdomen pelvis today with contrast.  Not a stable to discharge.   Antimicrobials: None.   DVT prophylaxis: heparin injection 5,000 Units Start: 09/02/21 2200 SCDs Start: 08/31/21 2143   Code Status:   Code Status: Full Code Family Communication: None.  Disposition Plan: Status is: Inpatient  Remains inpatient appropriate because:IV treatments appropriate due to intensity of illness or inability to take PO and Inpatient level of care appropriate due to severity of illness  Dispo: The patient is from: Home              Anticipated d/c is to: Home              Patient currently is not medically stable to d/c.   Difficult to place patient No   Objective: Blood pressure (!) 167/96, pulse 72, temperature 98.6 F (37 C), temperature source Oral, resp. rate 18, height 6' (1.829 m), weight 122.5 kg, SpO2 94 %.  Examination: General: Mildly anxious and in moderate pain on examination. Icteric on exam. Cardiovascular: S1-S2 normal.  Regular rate rhythm. Respiratory: Bilateral clear. Gastrointestinal: Soft.  Diffuse tenderness in the epigastrium and resistant to exam but no rigidity or guarding. Ext: No swelling or edema.  No cyanosis. Neuro: Alert oriented x4.  No neurological deficits.   Consultants:  GI  Procedures:  EGD,  09/02/2021  Data Reviewed: I have personally reviewed following labs and imaging studies Results for orders placed or performed during the hospital encounter of 08/31/21 (from the past 24 hour(s))  Glucose, capillary     Status: Abnormal   Collection Time: 09/05/21  5:32 PM  Result Value Ref Range   Glucose-Capillary 124 (H) 70 - 99 mg/dL  Glucose, capillary     Status: Abnormal   Collection  Time: 09/05/21  9:02 PM  Result Value Ref Range   Glucose-Capillary 119 (H) 70 - 99 mg/dL  CBC with Differential/Platelet     Status: Abnormal   Collection Time: 09/06/21  6:46 AM  Result Value Ref Range   WBC 6.4 4.0 - 10.5 K/uL   RBC 4.74 3.87 - 5.11 MIL/uL   Hemoglobin 13.1 12.0 - 15.0 g/dL   HCT 40.6 36.0 - 46.0 %   MCV 85.7 80.0 - 100.0 fL   MCH 27.6 26.0 - 34.0 pg   MCHC 32.3 30.0 - 36.0 g/dL   RDW 20.8 (H) 11.5 - 15.5 %   Platelets 169 150 - 400 K/uL   nRBC 0.0 0.0 - 0.2 %   Neutrophils Relative % 61 %   Neutro Abs 3.9 1.7 - 7.7 K/uL   Lymphocytes Relative 17 %   Lymphs Abs 1.1 0.7 - 4.0 K/uL   Monocytes Relative 13 %   Monocytes Absolute 0.8 0.1 - 1.0 K/uL   Eosinophils Relative 6 %   Eosinophils Absolute 0.4 0.0 - 0.5 K/uL   Basophils Relative 1 %   Basophils Absolute 0.1 0.0 - 0.1 K/uL   Immature Granulocytes 2 %   Abs Immature Granulocytes 0.10 (H) 0.00 - 0.07 K/uL   Polychromasia PRESENT    Target Cells PRESENT   Comprehensive metabolic panel     Status: Abnormal   Collection Time: 09/06/21  6:46 AM  Result Value Ref Range   Sodium 139 135 - 145 mmol/L   Potassium 3.8 3.5 - 5.1 mmol/L   Chloride 99 98 - 111 mmol/L   CO2 30 22 - 32 mmol/L   Glucose, Bld 112 (H) 70 - 99 mg/dL   BUN 10 6 - 20 mg/dL   Creatinine, Ser 1.10 (H) 0.44 - 1.00 mg/dL   Calcium 9.1 8.9 - 10.3 mg/dL   Total Protein 6.9 6.5 - 8.1 g/dL   Albumin 3.3 (L) 3.5 - 5.0 g/dL   AST 873 (H) 15 - 41 U/L   ALT 1,312 (H) 0 - 44 U/L   Alkaline Phosphatase 131 (H) 38 - 126 U/L   Total Bilirubin 16.2 (H) 0.3 - 1.2 mg/dL   GFR, Estimated >60 >60 mL/min   Anion gap 10 5 - 15  Magnesium     Status: Abnormal   Collection Time: 09/06/21  6:46 AM  Result Value Ref Range   Magnesium 1.5 (L) 1.7 - 2.4 mg/dL  Glucose, capillary     Status: Abnormal   Collection Time: 09/06/21  7:33 AM  Result Value Ref Range   Glucose-Capillary 107 (H) 70 - 99 mg/dL  Protime-INR     Status: None   Collection Time:  09/06/21  9:20 AM  Result Value Ref Range   Prothrombin Time 13.6 11.4 - 15.2 seconds   INR 1.0 0.8 - 1.2  Glucose, capillary     Status: Abnormal   Collection Time: 09/06/21 11:28 AM  Result Value Ref Range   Glucose-Capillary 120 (H) 70 - 99 mg/dL    Recent Results (from the past  240 hour(s))  Resp Panel by RT-PCR (Flu A&B, Covid) Nasopharyngeal Swab     Status: None   Collection Time: 08/31/21  8:25 PM   Specimen: Nasopharyngeal Swab; Nasopharyngeal(NP) swabs in vial transport medium  Result Value Ref Range Status   SARS Coronavirus 2 by RT PCR NEGATIVE NEGATIVE Final    Comment: (NOTE) SARS-CoV-2 target nucleic acids are NOT DETECTED.  The SARS-CoV-2 RNA is generally detectable in upper respiratory specimens during the acute phase of infection. The lowest concentration of SARS-CoV-2 viral copies this assay can detect is 138 copies/mL. A negative result does not preclude SARS-Cov-2 infection and should not be used as the sole basis for treatment or other patient management decisions. A negative result may occur with  improper specimen collection/handling, submission of specimen other than nasopharyngeal swab, presence of viral mutation(s) within the areas targeted by this assay, and inadequate number of viral copies(<138 copies/mL). A negative result must be combined with clinical observations, patient history, and epidemiological information. The expected result is Negative.  Fact Sheet for Patients:  EntrepreneurPulse.com.au  Fact Sheet for Healthcare Providers:  IncredibleEmployment.be  This test is no t yet approved or cleared by the [place] FDA and  has been authorized for detection and/or diagnosis of SARS-CoV-2 by FDA under an Emergency Use Authorization (EUA). This EUA will remain  in effect (meaning this test can be used) for the duration of the COVID-19 declaration under Section 564(b)(1) of the Act, 21 U.S.C.section  360bbb-3(b)(1), unless the authorization is terminated  or revoked sooner.       Influenza A by PCR NEGATIVE NEGATIVE Final   Influenza B by PCR NEGATIVE NEGATIVE Final    Comment: (NOTE) The Xpert Xpress SARS-CoV-2/FLU/RSV plus assay is intended as an aid in the diagnosis of influenza from Nasopharyngeal swab specimens and should not be used as a sole basis for treatment. Nasal washings and aspirates are unacceptable for Xpert Xpress SARS-CoV-2/FLU/RSV testing.  Fact Sheet for Patients: EntrepreneurPulse.com.au  Fact Sheet for Healthcare Providers: IncredibleEmployment.be  This test is not yet approved or cleared by the [place] FDA and has been authorized for detection and/or diagnosis of SARS-CoV-2 by FDA under an Emergency Use Authorization (EUA). This EUA will remain in effect (meaning this test can be used) for the duration of the COVID-19 declaration under Section 564(b)(1) of the Act, 21 U.S.C. section 360bbb-3(b)(1), unless the authorization is terminated or revoked.  Performed at [facility], [address] [place], [place] [address]      Radiology Studies: No results found. US RENAL  Final Result    US Abdomen Limited RUQ (LIVER/GB)  Final Result  Addendum (preliminary) 1 of 1  ADDENDUM REPORT: 09/01/2021 12:16    ADDENDUM:  Dictation error occurred in the finding section of the report.  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      Electronically Signed    By: Dahlia Bailiff M.D.    On: 09/01/2021 12:16      Final    CT ABDOMEN PELVIS W CONTRAST    (Results Pending)    Scheduled Meds:  amLODipine  10 mg Oral Daily   fluticasone furoate-vilanterol  1 puff Inhalation q morning   heparin  5,000 Units Subcutaneous Q8H   insulin aspart  0-5 Units Subcutaneous QHS   insulin aspart  0-9 Units Subcutaneous TID WC   metoprolol succinate  50 mg Oral q morning   pantoprazole (PROTONIX)  IV  40 mg Intravenous QHS   PRN  Meds: albuterol, fentaNYL (SUBLIMAZE) injection, ondansetron (ZOFRAN) IV, oxyCODONE, promethazine (PHENERGAN) injection (IM or IVPB) Continuous Infusions:  sodium chloride 125 mL/hr at 09/06/21 0657   promethazine (PHENERGAN) injection (IM or IVPB) 6.25 mg (09/05/21 2302)     LOS: 5 days  Time spent: 30 minutes.  Barb Merino, MD [facility] 09/06/2021, 2:31 PM

## 2021-09-07 DIAGNOSIS — R894 Abnormal immunological findings in specimens from other organs, systems and tissues: Secondary | ICD-10-CM | POA: Diagnosis not present

## 2021-09-07 DIAGNOSIS — R1011 Right upper quadrant pain: Secondary | ICD-10-CM

## 2021-09-07 DIAGNOSIS — R7989 Other specified abnormal findings of blood chemistry: Secondary | ICD-10-CM

## 2021-09-07 DIAGNOSIS — R945 Abnormal results of liver function studies: Secondary | ICD-10-CM

## 2021-09-07 DIAGNOSIS — B169 Acute hepatitis B without delta-agent and without hepatic coma: Secondary | ICD-10-CM | POA: Diagnosis not present

## 2021-09-07 LAB — CBC WITH DIFFERENTIAL/PLATELET
Abs Immature Granulocytes: 0.07 10*3/uL (ref 0.00–0.07)
Basophils Absolute: 0.1 10*3/uL (ref 0.0–0.1)
Basophils Relative: 1 %
Eosinophils Absolute: 0.4 10*3/uL (ref 0.0–0.5)
Eosinophils Relative: 6 %
HCT: 39.4 % (ref 36.0–46.0)
Hemoglobin: 12.8 g/dL (ref 12.0–15.0)
Immature Granulocytes: 1 %
Lymphocytes Relative: 17 %
Lymphs Abs: 1 10*3/uL (ref 0.7–4.0)
MCH: 27.2 pg (ref 26.0–34.0)
MCHC: 32.5 g/dL (ref 30.0–36.0)
MCV: 83.8 fL (ref 80.0–100.0)
Monocytes Absolute: 0.8 10*3/uL (ref 0.1–1.0)
Monocytes Relative: 13 %
Neutro Abs: 3.5 10*3/uL (ref 1.7–7.7)
Neutrophils Relative %: 62 %
Platelets: 183 10*3/uL (ref 150–400)
RBC: 4.7 MIL/uL (ref 3.87–5.11)
RDW: 20 % — ABNORMAL HIGH (ref 11.5–15.5)
WBC: 5.7 10*3/uL (ref 4.0–10.5)
nRBC: 0 % (ref 0.0–0.2)

## 2021-09-07 LAB — GLUCOSE, CAPILLARY
Glucose-Capillary: 93 mg/dL (ref 70–99)
Glucose-Capillary: 108 mg/dL — ABNORMAL HIGH (ref 70–99)
Glucose-Capillary: 105 mg/dL — ABNORMAL HIGH (ref 70–99)
Glucose-Capillary: 108 mg/dL — ABNORMAL HIGH (ref 70–99)

## 2021-09-07 LAB — COMPREHENSIVE METABOLIC PANEL
ALT: 969 U/L — ABNORMAL HIGH (ref 0–44)
AST: 576 U/L — ABNORMAL HIGH (ref 15–41)
Albumin: 3.1 g/dL — ABNORMAL LOW (ref 3.5–5.0)
Alkaline Phosphatase: 142 U/L — ABNORMAL HIGH (ref 38–126)
Anion gap: 10 (ref 5–15)
BUN: 9 mg/dL (ref 6–20)
CO2: 28 mmol/L (ref 22–32)
Calcium: 8.8 mg/dL — ABNORMAL LOW (ref 8.9–10.3)
Chloride: 98 mmol/L (ref 98–111)
Creatinine, Ser: 0.89 mg/dL (ref 0.44–1.00)
GFR, Estimated: >60 mL/min (ref >60)
Glucose, Bld: 104 mg/dL — ABNORMAL HIGH (ref 70–99)
Potassium: 3.2 mmol/L — ABNORMAL LOW (ref 3.5–5.1)
Sodium: 136 mmol/L (ref 135–145)
Total Bilirubin: 14.5 mg/dL — ABNORMAL HIGH (ref 0.3–1.2)
Total Protein: 6.7 g/dL (ref 6.5–8.1)

## 2021-09-07 LAB — MAGNESIUM: Magnesium: 1.6 mg/dL — ABNORMAL LOW (ref 1.7–2.4)

## 2021-09-07 LAB — PROTIME-INR
INR: 1 (ref 0.8–1.2)
Prothrombin Time: 13.3 seconds (ref 11.4–15.2)

## 2021-09-07 MED ORDER — MAGNESIUM SULFATE 2 GM/50ML IV SOLN
2.0000 g | Freq: Once | INTRAVENOUS | Status: AC
  Administered 2021-09-07: 2 g via INTRAVENOUS
  Filled 2021-09-07: qty 50

## 2021-09-07 MED ORDER — PROMETHAZINE HCL 25 MG/ML IJ SOLN
INTRAMUSCULAR | Status: AC
  Filled 2021-09-07: qty 1

## 2021-09-07 MED ORDER — POTASSIUM CHLORIDE 10 MEQ/100ML IV SOLN
10.0000 meq | INTRAVENOUS | Status: AC
  Administered 2021-09-07 (×4): 10 meq via INTRAVENOUS
  Filled 2021-09-07 (×3): qty 100

## 2021-09-07 MED ORDER — HYOSCYAMINE SULFATE 0.125 MG SL SUBL
0.1250 mg | SUBLINGUAL_TABLET | Freq: Four times a day (QID) | SUBLINGUAL | Status: DC | PRN
  Administered 2021-09-07 – 2021-09-10 (×4): 0.125 mg via SUBLINGUAL
  Filled 2021-09-07 (×4): qty 1

## 2021-09-07 NOTE — Progress Notes (Signed)
Subjective:  Feels some better today. Trying to force herself to eat. Pain is improved.   Objective: Vital signs in last 24 hours: Temp:  [98.6 F (37 C)-99.6 F (37.6 C)] 98.6 F (37 C) (09/14 0502) Pulse Rate:  [70-72] 70 (09/14 0502) Resp:  [15-18] 15 (09/14 0502) BP: (151-167)/(94-96) 151/94 (09/14 0502) SpO2:  [93 %-96 %] 96 % (09/14 0808) Last BM Date: 09/04/21 General:   Alert,  Well-developed, well-nourished, pleasant and cooperative in NAD Head:  Normocephalic and atraumatic. Eyes:  Sclera clear, no icterus.  Abdomen:  Soft, nondistended. Normal bowel sounds, without guarding, and without rebound.  Moderate ruq tenderness Extremities:  Without clubbing, deformity or edema. Neurologic:  Alert and  oriented x4;  grossly normal neurologically. Skin:  Intact without significant lesions or rashes. Psych:  Alert and cooperative. Normal mood and affect.  Intake/Output from previous day: 09/13 0701 - 09/14 0700 In: 2868.3 [P.O.:200; I.V.:2468.3; IV Piggyback:200] Out: 1000 [Urine:1000] Intake/Output this shift: Total I/O In: -  Out: 500 [Urine:500]  Lab Results: CBC Recent Labs    09/05/21 0642 09/06/21 0646 09/07/21 0612  WBC 5.9 6.4 5.7  HGB 12.7 13.1 12.8  HCT 38.8 40.6 39.4  MCV 85.1 85.7 83.8  PLT 152 169 183   BMET Recent Labs    09/05/21 0642 09/06/21 0646 09/07/21 0612  NA 136 139 136  K 3.6 3.8 3.2*  CL 101 99 98  CO2 '26 30 28  '$ GLUCOSE 103* 112* 104*  BUN '15 10 9  '$ CREATININE 1.42* 1.10* 0.89  CALCIUM 8.7* 9.1 8.8*   LFTs Recent Labs    09/05/21 0642 09/06/21 0646 09/07/21 0612  BILITOT 15.3* 16.2* 14.5*  ALKPHOS 122 131* 142*  AST 1,110* 873* 576*  ALT 1,421* 1,312* 969*  PROT 6.4* 6.9 6.7  ALBUMIN 3.1* 3.3* 3.1*   No results for input(s): LIPASE in the last 72 hours. PT/INR Recent Labs    09/05/21 0642 09/06/21 0920 09/07/21 0612  LABPROT 14.2 13.6 13.3  INR 1.1 1.0 1.0      Imaging Studies: CT ABDOMEN PELVIS W  CONTRAST  Result Date: 09/06/2021 CLINICAL DATA:  Nonlocalized abdominal pain with nausea and vomiting, recent diagnosis of acute hepatitis B infection EXAM: CT ABDOMEN AND PELVIS WITH CONTRAST TECHNIQUE: Multidetector CT imaging of the abdomen and pelvis was performed using the standard protocol following bolus administration of intravenous contrast. CONTRAST:  80 cc Omnipaque 350 COMPARISON:  Ultrasound August 31, 2021 and CT November 21, 2010 FINDINGS: Lower chest: Scattered bilateral subsegmental atelectasis. Small right pleural effusion with adjacent atelectasis versus consolidation. Hepatobiliary: Periportal edema. No suspicious hepatic lesion. Gallbladder surgically absent. No biliary ductal dilation. Pancreas: Within normal limits. Spleen: Within normal limits. Adrenals/Urinary Tract: Adrenal glands are unremarkable. Kidneys are normal, without renal calculi, solid enhancing lesion, or hydronephrosis. Bladder is unremarkable for degree of distension. Stomach/Bowel: Radiopaque enteric contrast traverses the hepatic flexure. Small hiatal hernia otherwise the stomach is unremarkable for degree of distension. No pathologic dilation of small or large bowel. The appendix and terminal ileum appear normal. Colonic anastomotic sutures at the splenic flexure. Colonic diverticulosis without findings of acute diverticulitis. Vascular/Lymphatic: No abdominal aortic aneurysm. The portal, splenic and superior mesenteric veins appear patent. No pathologically enlarged abdominal or pelvic lymph nodes. Reproductive: Uterus and left adnexa are unremarkable. Simple appearing 2.2 cm right ovarian cyst. No follow-up imaging recommended. Note: This recommendation does not apply to premenarchal patients and to those with increased risk (genetic, family history, elevated tumor markers or other  high-risk factors) of ovarian cancer. Reference: JACR 2020 Feb; 17(2):248-254 Other: Trace perihepatic free fluid. No walled off fluid  collections. Likely sequela of subcutaneous injections in the anterior abdominal wall. Trace nonspecific subcutaneous edema. Musculoskeletal: Thoracolumbar spondylosis. No acute osseous abnormality. IMPRESSION: 1. Periportal edema with trace perihepatic free fluid, as can be seen with patient's reported acute hepatitis B infection. 2. Small right pleural effusion with adjacent atelectasis versus consolidation. 3. Colonic diverticulosis without findings of acute diverticulitis. Electronically Signed   By: Dahlia Bailiff M.D.   On: 09/06/2021 14:53   US RENAL  Result Date: 09/02/2021 CLINICAL DATA:  Acute renal failure Hypertension EXAM: RENAL / URINARY TRACT ULTRASOUND COMPLETE COMPARISON:  CT abdomen pelvis 11/21/2010 FINDINGS: Right Kidney: Renal measurements: 13.6 x 4.9 x 7.2 cm = volume: 251 mL. Echogenicity within normal limits. No mass or hydronephrosis visualized. Left Kidney: Renal measurements: 13.2 x 7.1 x 6.6 cm = volume: 324 mL. Echogenicity within normal limits. No mass or hydronephrosis visualized. Bladder: Appears normal for degree of bladder distention. Other: None. IMPRESSION: No significant sonographic abnormality of the kidneys. Electronically Signed   By: Miachel Roux M.D.   On: 09/02/2021 13:33   US Abdomen Limited RUQ (LIVER/GB)  Addendum Date: 09/01/2021   ADDENDUM REPORT: 09/01/2021 12:16 ADDENDUM: Dictation error occurred in the finding section of the report. 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 Electronically Signed   By: Dahlia Bailiff M.D.   On: 09/01/2021 12:16   Result Date: 09/01/2021 CLINICAL DATA:  Elevated LFTs EXAM: ULTRASOUND ABDOMEN LIMITED RIGHT UPPER QUADRANT COMPARISON:  CT November 21, 2010. FINDINGS: Gallbladder: No gallstones or wall thickening visualized. No sonographic Murphy sign noted by sonographer. Common bile duct: Diameter: 5 mm Liver: No focal lesion identified. Diffusely increased parenchymal echogenicity with decreased acoustic  penetration. Portal vein is patent on color Doppler imaging with normal direction of blood flow towards the liver. Other: None. IMPRESSION: The echogenicity of the liver is increased. This is a nonspecific finding but is most commonly seen with fatty infiltration of the liver. There are no obvious focal liver lesions. Electronically Signed: By: Dahlia Bailiff M.D. On: 08/31/2021 18:27  [2 weeks]   Assessment:  Pleasant 50 year old female with history of colon cancer status post segmental resection in 2016, diabetes, hypertension, migraines, remote biliary pancreatitis status post cholecystectomy 2010 presenting for markedly abnormal LFTs with associated upper abdominal pain and nausea.  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  Transaminitis with hyperbilirubinemia: Hepatitis B surface antigen positive, hepatitis B core IgM positive, hepatitis Be+, HBV DNA 369,000.  Currently receiving supportive management for acute hepatitis B.  She was also positive for HSV 1/2 IgM.  Plans for liver biopsy to exclude concomitant herpetic hepatitis.  Fortunately her synthetic function has remained normal.  Hepatitis D pending.  Over the last 24 hours, total bilirubin 16.2--> 14.5. AP 131-->142, AST 879-->576, ALT 1312-->969.   Abdominal pain: EGD this admission with gastritis, biopsies negative for H. pylori.  CT imaging yesterday (delayed previously due to renal function) showed periportal edema with trace perihepatic free fluid but otherwise no acute findings. Abdominal pain improved today.  Acute renal failure: resolved. Creatinine on admission 2.67. Today down to 0.89.  Plan: Liver biopsy scheduled for tomorrow. Continue supportive measures.  Continue to monitor labs.   Laureen Ochs. Bernarda Caffey [facility] [phone] 9/14/20222:22 PM       LOS: 6 days

## 2021-09-07 NOTE — Progress Notes (Addendum)
Progress Note    Shelly Larson   [id number]  DOB: Apr 08, 1971  DOA: 08/31/2021     6  PCP: [facility]  Initial CC: abdominal pain   Hospital Course: 50 year old with a history of hypertension, asthma, colon cancer status post resection in 2016, biliary pancreatitis status post cholecystectomy 2010 referred to emergency department with abnormal liver enzymes.  She was being evaluated for abdominal pain with nausea by her PCP.  This included blood work which revealed elevated LFTs.  In the ED, AST was noted to be 1036, and ALT was noted to be 1476.  Total bilirubin was 13.  RUQ ultrasound noted increased echogenicity of the liver consistent with fatty infiltrate but no other obvious lesions. -Work-up positive for hep B surface antigen and hep B core IgM antibody, also positive for HSV-1  Interval History:  -Overall feels a little better, mild abdominal discomfort, some nausea, no vomiting  Assessment & Plan:  Acute hepatitis B infection.  ?  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 Also positive for HSV 1. - Viral load 300,000. -Gastroenterology following, improving with supportive care, cut down IVF today -GI recommended liver biopsy, interventional radiology consulted -Plan for biopsy tomorrow at [facility], will transfer back after procedure  AKI -Resolved  -baseline creatinine ~ 0.9, creatinine peaked at 2.6, improving, likely prerenal - improved with supportive care  HTN BP better today after resuming metoprolol and amlodipine. Holding losartan hydrochlorothiazide because of acute renal failure.   Asthma Well compensated at present -continue usual home inhaled medications.   DM2 A1c 6.2 - CBG well controlled . On metformin at home.  Hypokalemia Hypomagnesemia -Replaced  Obesity BMI 36.6  DVT prophylaxis: Place and maintain sequential compression device Start: 09/06/21 2116 SCDs Start: 08/31/21  2143   Code Status:   Code Status: Full Code Family Communication: None.  Disposition Plan: Status is: Inpatient  Remains inpatient appropriate because:IV treatments appropriate due to intensity of illness or inability to take PO and Inpatient level of care appropriate due to severity of illness  Dispo: The patient is from: Home              Anticipated d/c is to: Home              Patient currently is not medically stable to d/c.   Difficult to place patient No   Objective: Blood pressure (!) 151/94, pulse 70, temperature 98.6 F (37 C), temperature source Oral, resp. rate 15, height 6' (1.829 m), weight 122.5 kg, SpO2 96 %.  Examination: Gen: Obese pleasant female sitting up in bed awake, Alert, Oriented X 3, no distress HEENT: no JVD Lungs: Good air movement bilaterally, CTAB CVS: S1S2/RRR Abd: soft, mild epigastric tenderness, no rigidity or rebound, bowel sounds present Extremities: No edema  Consultants:  GI  Procedures:  EGD, 09/02/2021  Data Reviewed: I have personally reviewed following labs and imaging studies Results for orders placed or performed during the hospital encounter of 08/31/21 (from the past 24 hour(s))  Glucose, capillary     Status: Abnormal   Collection Time: 09/06/21 11:28 AM  Result Value Ref Range   Glucose-Capillary 120 (H) 70 - 99 mg/dL  Glucose, capillary     Status: Abnormal   Collection Time: 09/06/21  4:17 PM  Result Value Ref Range   Glucose-Capillary 126 (H) 70 - 99 mg/dL  Glucose, capillary     Status: Abnormal  Collection Time: 09/06/21  9:21 PM  Result Value Ref Range   Glucose-Capillary 127 (H) 70 - 99 mg/dL  CBC with Differential/Platelet     Status: Abnormal   Collection Time: 09/07/21  6:12 AM  Result Value Ref Range   WBC 5.7 4.0 - 10.5 K/uL   RBC 4.70 3.87 - 5.11 MIL/uL   Hemoglobin 12.8 12.0 - 15.0 g/dL   HCT 39.4 36.0 - 46.0 %   MCV 83.8 80.0 - 100.0 fL   MCH 27.2 26.0 - 34.0 pg   MCHC 32.5 30.0 - 36.0 g/dL   RDW  20.0 (H) 11.5 - 15.5 %   Platelets 183 150 - 400 K/uL   nRBC 0.0 0.0 - 0.2 %   Neutrophils Relative % 62 %   Neutro Abs 3.5 1.7 - 7.7 K/uL   Lymphocytes Relative 17 %   Lymphs Abs 1.0 0.7 - 4.0 K/uL   Monocytes Relative 13 %   Monocytes Absolute 0.8 0.1 - 1.0 K/uL   Eosinophils Relative 6 %   Eosinophils Absolute 0.4 0.0 - 0.5 K/uL   Basophils Relative 1 %   Basophils Absolute 0.1 0.0 - 0.1 K/uL   Immature Granulocytes 1 %   Abs Immature Granulocytes 0.07 0.00 - 0.07 K/uL   Polychromasia PRESENT    Target Cells PRESENT   Comprehensive metabolic panel     Status: Abnormal   Collection Time: 09/07/21  6:12 AM  Result Value Ref Range   Sodium 136 135 - 145 mmol/L   Potassium 3.2 (L) 3.5 - 5.1 mmol/L   Chloride 98 98 - 111 mmol/L   CO2 28 22 - 32 mmol/L   Glucose, Bld 104 (H) 70 - 99 mg/dL   BUN 9 6 - 20 mg/dL   Creatinine, Ser 0.89 0.44 - 1.00 mg/dL   Calcium 8.8 (L) 8.9 - 10.3 mg/dL   Total Protein 6.7 6.5 - 8.1 g/dL   Albumin 3.1 (L) 3.5 - 5.0 g/dL   AST 576 (H) 15 - 41 U/L   ALT 969 (H) 0 - 44 U/L   Alkaline Phosphatase 142 (H) 38 - 126 U/L   Total Bilirubin 14.5 (H) 0.3 - 1.2 mg/dL   GFR, Estimated >60 >60 mL/min   Anion gap 10 5 - 15  Magnesium     Status: Abnormal   Collection Time: 09/07/21  6:12 AM  Result Value Ref Range   Magnesium 1.6 (L) 1.7 - 2.4 mg/dL  Protime-INR     Status: None   Collection Time: 09/07/21  6:12 AM  Result Value Ref Range   Prothrombin Time 13.3 11.4 - 15.2 seconds   INR 1.0 0.8 - 1.2  Glucose, capillary     Status: None   Collection Time: 09/07/21  7:37 AM  Result Value Ref Range   Glucose-Capillary 93 70 - 99 mg/dL    Recent Results (from the past 240 hour(s))  Resp Panel by RT-PCR (Flu A&B, Covid) Nasopharyngeal Swab     Status: None   Collection Time: 08/31/21  8:25 PM   Specimen: Nasopharyngeal Swab; Nasopharyngeal(NP) swabs in vial transport medium  Result Value Ref Range Status   SARS Coronavirus 2 by RT PCR NEGATIVE NEGATIVE  Final    Comment: (NOTE) SARS-CoV-2 target nucleic acids are NOT DETECTED.  The SARS-CoV-2 RNA is generally detectable in upper respiratory specimens during the acute phase of infection. The lowest concentration of SARS-CoV-2 viral copies this assay can detect is 138 copies/mL. A negative result does not preclude  SARS-Cov-2 infection and should not be used as the sole basis for treatment or other patient management decisions. A negative result may occur with  improper specimen collection/handling, submission of specimen other than nasopharyngeal swab, presence of viral mutation(s) within the areas targeted by this assay, and inadequate number of viral copies(<138 copies/mL). A negative result must be combined with clinical observations, patient history, and epidemiological information. The expected result is Negative.  Fact Sheet for Patients:  EntrepreneurPulse.com.au  Fact Sheet for Healthcare Providers:  IncredibleEmployment.be  This test is no t yet approved or cleared by the [place] FDA and  has been authorized for detection and/or diagnosis of SARS-CoV-2 by FDA under an Emergency Use Authorization (EUA). This EUA will remain  in effect (meaning this test can be used) for the duration of the COVID-19 declaration under Section 564(b)(1) of the Act, 21 U.S.C.section 360bbb-3(b)(1), unless the authorization is terminated  or revoked sooner.       Influenza A by PCR NEGATIVE NEGATIVE Final   Influenza B by PCR NEGATIVE NEGATIVE Final    Comment: (NOTE) The Xpert Xpress SARS-CoV-2/FLU/RSV plus assay is intended as an aid in the diagnosis of influenza from Nasopharyngeal swab specimens and should not be used as a sole basis for treatment. Nasal washings and aspirates are unacceptable for Xpert Xpress SARS-CoV-2/FLU/RSV testing.  Fact Sheet for Patients: EntrepreneurPulse.com.au  Fact Sheet for Healthcare  Providers: IncredibleEmployment.be  This test is not yet approved or cleared by the [place] FDA and has been authorized for detection and/or diagnosis of SARS-CoV-2 by FDA under an Emergency Use Authorization (EUA). This EUA will remain in effect (meaning this test can be used) for the duration of the COVID-19 declaration under Section 564(b)(1) of the Act, 21 U.S.C. section 360bbb-3(b)(1), unless the authorization is terminated or revoked.  Performed at [facility], [address] [place],  [address]      Radiology Studies: CT ABDOMEN PELVIS W CONTRAST  Result Date: 09/06/2021 CLINICAL DATA:  Nonlocalized abdominal pain with nausea and vomiting, recent diagnosis of acute hepatitis B infection EXAM: CT ABDOMEN AND PELVIS WITH CONTRAST TECHNIQUE: Multidetector CT imaging of the abdomen and pelvis was performed using the standard protocol following bolus administration of intravenous contrast. CONTRAST:  80 cc Omnipaque 350 COMPARISON:  Ultrasound August 31, 2021 and CT November 21, 2010 FINDINGS: Lower chest: Scattered bilateral subsegmental atelectasis. Small right pleural effusion with adjacent atelectasis versus consolidation. Hepatobiliary: Periportal edema. No suspicious hepatic lesion. Gallbladder surgically absent. No biliary ductal dilation. Pancreas: Within normal limits. Spleen: Within normal limits. Adrenals/Urinary Tract: Adrenal glands are unremarkable. Kidneys are normal, without renal calculi, solid enhancing lesion, or hydronephrosis. Bladder is unremarkable for degree of distension. Stomach/Bowel: Radiopaque enteric contrast traverses the hepatic flexure. Small hiatal hernia otherwise the stomach is unremarkable for degree of distension. No pathologic dilation of small or large bowel. The appendix and terminal ileum appear normal. Colonic anastomotic sutures at the splenic flexure. Colonic diverticulosis without findings of acute diverticulitis.  Vascular/Lymphatic: No abdominal aortic aneurysm. The portal, splenic and superior mesenteric veins appear patent. No pathologically enlarged abdominal or pelvic lymph nodes. Reproductive: Uterus and left adnexa are unremarkable. Simple appearing 2.2 cm right ovarian cyst. No follow-up imaging recommended. Note: This recommendation does not apply to premenarchal patients and to those with increased risk (genetic, family history, elevated tumor markers or other high-risk factors) of ovarian cancer. Reference: JACR 2020 Feb; 17(2):248-254 Other: Trace perihepatic free fluid. No walled off fluid collections. Likely sequela of subcutaneous injections  in the anterior abdominal wall. Trace nonspecific subcutaneous edema. Musculoskeletal: Thoracolumbar spondylosis. No acute osseous abnormality. IMPRESSION: 1. Periportal edema with trace perihepatic free fluid, as can be seen with patient's reported acute hepatitis B infection. 2. Small right pleural effusion with adjacent atelectasis versus consolidation. 3. Colonic diverticulosis without findings of acute diverticulitis. Electronically Signed   By: Dahlia Bailiff M.D.   On: 09/06/2021 14:53   CT ABDOMEN PELVIS W CONTRAST  Final Result    US RENAL  Final Result    US Abdomen Limited RUQ (LIVER/GB)  Final Result  Addendum (preliminary) 1 of 1  ADDENDUM REPORT: 09/01/2021 12:16    ADDENDUM:  Dictation error occurred in the finding section of the report.  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      Electronically Signed    By: Dahlia Bailiff M.D.    On: 09/01/2021 12:16      Final    US BIOPSY (LIVER)    (Results Pending)    Scheduled Meds:  amLODipine  10 mg Oral Daily   fluticasone furoate-vilanterol  1 puff Inhalation q morning   insulin aspart  0-5 Units Subcutaneous QHS   insulin aspart  0-9 Units Subcutaneous TID WC   metoprolol succinate  50 mg Oral q morning   pantoprazole (PROTONIX) IV  40 mg Intravenous QHS    PRN Meds: albuterol, fentaNYL (SUBLIMAZE) injection, ondansetron (ZOFRAN) IV, oxyCODONE, promethazine (PHENERGAN) injection (IM or IVPB) Continuous Infusions:  sodium chloride 125 mL/hr at 09/07/21 0600   potassium chloride 10 mEq (09/07/21 0953)   promethazine (PHENERGAN) injection (IM or IVPB) Stopped (09/07/21 0348)     LOS: 6 days  Time spent: 35 minutes.  Domenic Polite, MD [facility] 09/07/2021, 10:36 AM

## 2021-09-07 NOTE — Consult Note (Signed)
Chief Complaint: Patient was seen in consultation today for liver core biopsy Chief Complaint  Patient presents with   Jaundice   at the request of Dr Raelyn Mora  Referring Physician(s): Dr Dutch Quint  Supervising Physician: Arne Cleveland  Patient Status: [facility] IP  History of Present Illness: Shelly Larson is a 50 y.o. 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 We are also screening him for delta hepatitis which only occurs in setting of hepatitis B. I am concerned that her liver injury may be perpetuated because of hepatic hepatitis which is potentially treatable. Therefore would recommend liver biopsy either via IJ or percutaneous approach as deemed to be appropriate and safe by IR physician.  Request made for random liver core biopsy To be performed in IR 9/15 at [facility]  Past Medical History:  Diagnosis Date   ADHD    Anemia    Arthritis    Biliary acute pancreatitis 2010   Colon cancer ([place]) 2016   Transverse colon    Diabetes mellitus without complication ([place])    Dyspnea    Hypertension    Migraine     Past Surgical History:  Procedure Laterality Date   BAND HEMORRHOIDECTOMY     BIOPSY  09/02/2021   Procedure: BIOPSY;  Surgeon: Eloise Harman, DO;  Location: [facility] ENDO SUITE;  Service: Endoscopy;;   CHOLECYSTECTOMY  2010   [place], biliary pancreatitis   COLECTOMY  09/30/2015   distal transverse d/t adenocarcinoma   COLONOSCOPY N/A 06/11/2018   Procedure: COLONOSCOPY;  Surgeon: Daneil Dolin, MD;  Location: [facility] SU[facility];  Service: Endoscopy;  Laterality: N/A;  1:45pm   DILITATION & CURRETTAGE/HYSTROSCOPY WITH NOVASURE ABLATION N/A 09/10/2018   Procedure: DILATATION & CURETTAGE/HYSTEROSCOPY  WITH MINERVA  ENDOMETRIAL ABLATION;  Surgeon: Jonnie Kind, MD;  Location: [facility] ORS;  Service: Gynecology;  Laterality: N/A;   ESOPHAGOGASTRODUODENOSCOPY (EGD) WITH PROPOFOL N/A 09/02/2021   Procedure: ESOPHAGOGASTRODUODENOSCOPY (EGD) WITH PROPOFOL;  Surgeon: Eloise Harman, DO;  Location: [facility];  Service: Endoscopy;  Laterality: N/A;   POLYPECTOMY  06/11/2018   Procedure: POLYPECTOMY;  Surgeon: Daneil Dolin, MD;  Location: [facility];  Service: Endoscopy;;  colon    Allergies: Lisinopril  Medications: Prior to Admission medications   Medication Sig Start Date End Date Taking? Authorizing Provider  albuterol (VENTOLIN HFA) 108 (90 Base) MCG/ACT inhaler Inhale 2 puffs into the lungs every 4 (four) hours as needed for wheezing or shortness of breath.  10/17/17  Yes [provider]  amLODipine (NORVASC) 5 MG tablet TAKE 1 TABLET BY MOUTH EVERY DAY Patient taking differently: Take 5 mg by mouth every morning. 05/10/18  Yes Herminio Commons, MD  amphetamine-dextroamphetamine (ADDERALL XR) 20 MG 24 hr capsule Take 40 mg by mouth daily. 05/03/18  Yes [provider]  amphetamine-dextroamphetamine (ADDERALL) 10 MG tablet Take 10 mg by mouth See admin instructions. Take daily at 1:00PM.   Yes [provider]  cyclobenzaprine (FLEXERIL) 10 MG tablet Take 10 mg by mouth at bedtime as needed for muscle spasms.   Yes [provider]  fluticasone furoate-vilanterol (BREO ELLIPTA) 100-25 MCG/INH AEPB Inhale 1 puff into the lungs every morning.    Yes [provider]  losartan-hydrochlorothiazide (HYZAAR) 50-12.5 MG tablet Take 1 tablet by mouth every morning.    Yes [provider]  metFORMIN (GLUCOPHAGE) 500 MG tablet Take 1 tablet (500 mg total) by mouth 2 (two) times daily with a meal. 11/14/18  Yes Nida, Marella Chimes, MD  metoprolol succinate (TOPROL-XL) 50 MG 24 hr tablet Take 50 mg by mouth every morning.  09/19/17  Yes [provider]  nitroGLYCERIN (NITROSTAT) 0.4 MG SL tablet Place 0.4 mg under the tongue every 5 (five) minutes as needed.  08/21/17  Yes [provider]  OVER THE COUNTER MEDICATION Apply 1 application topically daily as needed (for pain hemmorrhoids). [facility]acare OTC    Yes [provider]  Topiramate ER (TROKENDI XR) 100 MG CP24 Take 1 capsule by mouth at bedtime.   Yes [provider]     Family History  Problem Relation Age of Onset   Hypertension Mother    Diabetes Mellitus II Mother    Heart disease Sister        dx in late 39's   Breast cancer Paternal Grandmother    Breast cancer Paternal Aunt    Colon cancer Neg Hx    Liver disease Neg Hx     Social History   Socioeconomic History   Marital status: Single    Spouse name: Not on file   Number of children: Not on file   Years of education: Not on file   Highest education level: Not on file  Occupational History   Occupation: Public affairs consultant  Tobacco Use   Smoking status: Never   Smokeless tobacco: Never  Vaping Use   Vaping Use: Never used  Substance and Sexual Activity   Alcohol use: Yes    Comment: rare   Drug use: No   Sexual activity: Yes    Birth control/protection: None, Surgical    Comment: ablation  Other Topics Concern   Not on file  Social History Narrative   Not on file   Social Determinants of Health   Financial Resource Strain: Not on file  Food Insecurity: Not on file  Transportation Needs: Not on file  Physical Activity: Not on file  Stress: Not on file  Social Connections: Not on file    Review of Systems: A 12 point ROS discussed and pertinent positives are indicated in the HPI above.  All other systems are negative.  Review of Systems  Constitutional:  Positive for appetite change and fatigue.  Respiratory:  Negative for cough and shortness of breath.   Cardiovascular:  Negative for chest pain.  Gastrointestinal:  Positive for abdominal pain, nausea and  vomiting.  Skin:  Negative for color change.  Psychiatric/Behavioral:  Negative for behavioral problems and confusion.    Vital Signs: BP (!) 151/94 (BP Location: Right Arm)   Pulse 70   Temp 98.6 F (37 C) (Oral)   Resp 15   Ht 6' (1.829 m)   Wt 270 lb (122.5 kg)   SpO2 96%   BMI 36.62 kg/m   Physical Exam Vitals reviewed.  HENT:     Mouth/Throat:     Mouth: Mucous membranes are moist.  Cardiovascular:     Rate and Rhythm: Normal rate and regular rhythm.     Heart sounds: Normal heart sounds.  Pulmonary:     Effort: Pulmonary effort is normal.     Breath sounds: Normal breath sounds.  Abdominal:     Palpations: Abdomen is soft.  Musculoskeletal:  General: Normal range of motion.  Skin:    General: Skin is warm.  Neurological:     Mental Status: She is alert and oriented to person, place, and time.  Psychiatric:        Behavior: Behavior normal.    Imaging: CT ABDOMEN PELVIS W CONTRAST  Result Date: 09/06/2021 CLINICAL DATA:  Nonlocalized abdominal pain with nausea and vomiting, recent diagnosis of acute hepatitis B infection EXAM: CT ABDOMEN AND PELVIS WITH CONTRAST TECHNIQUE: Multidetector CT imaging of the abdomen and pelvis was performed using the standard protocol following bolus administration of intravenous contrast. CONTRAST:  80 cc Omnipaque 350 COMPARISON:  Ultrasound August 31, 2021 and CT November 21, 2010 FINDINGS: Lower chest: Scattered bilateral subsegmental atelectasis. Small right pleural effusion with adjacent atelectasis versus consolidation. Hepatobiliary: Periportal edema. No suspicious hepatic lesion. Gallbladder surgically absent. No biliary ductal dilation. Pancreas: Within normal limits. Spleen: Within normal limits. Adrenals/Urinary Tract: Adrenal glands are unremarkable. Kidneys are normal, without renal calculi, solid enhancing lesion, or hydronephrosis. Bladder is unremarkable for degree of distension. Stomach/Bowel: Radiopaque enteric  contrast traverses the hepatic flexure. Small hiatal hernia otherwise the stomach is unremarkable for degree of distension. No pathologic dilation of small or large bowel. The appendix and terminal ileum appear normal. Colonic anastomotic sutures at the splenic flexure. Colonic diverticulosis without findings of acute diverticulitis. Vascular/Lymphatic: No abdominal aortic aneurysm. The portal, splenic and superior mesenteric veins appear patent. No pathologically enlarged abdominal or pelvic lymph nodes. Reproductive: Uterus and left adnexa are unremarkable. Simple appearing 2.2 cm right ovarian cyst. No follow-up imaging recommended. Note: This recommendation does not apply to premenarchal patients and to those with increased risk (genetic, family history, elevated tumor markers or other high-risk factors) of ovarian cancer. Reference: JACR 2020 Feb; 17(2):248-254 Other: Trace perihepatic free fluid. No walled off fluid collections. Likely sequela of subcutaneous injections in the anterior abdominal wall. Trace nonspecific subcutaneous edema. Musculoskeletal: Thoracolumbar spondylosis. No acute osseous abnormality. IMPRESSION: 1. Periportal edema with trace perihepatic free fluid, as can be seen with patient's reported acute hepatitis B infection. 2. Small right pleural effusion with adjacent atelectasis versus consolidation. 3. Colonic diverticulosis without findings of acute diverticulitis. Electronically Signed   By: Dahlia Bailiff M.D.   On: 09/06/2021 14:53   US RENAL  Result Date: 09/02/2021 CLINICAL DATA:  Acute renal failure Hypertension EXAM: RENAL / URINARY TRACT ULTRASOUND COMPLETE COMPARISON:  CT abdomen pelvis 11/21/2010 FINDINGS: Right Kidney: Renal measurements: 13.6 x 4.9 x 7.2 cm = volume: 251 mL. Echogenicity within normal limits. No mass or hydronephrosis visualized. Left Kidney: Renal measurements: 13.2 x 7.1 x 6.6 cm = volume: 324 mL. Echogenicity within normal limits. No mass or  hydronephrosis visualized. Bladder: Appears normal for degree of bladder distention. Other: None. IMPRESSION: No significant sonographic abnormality of the kidneys. Electronically Signed   By: Miachel Roux M.D.   On: 09/02/2021 13:33   US Abdomen Limited RUQ (LIVER/GB)  Addendum Date: 09/01/2021   ADDENDUM REPORT: 09/01/2021 12:16 ADDENDUM: Dictation error occurred in the finding section of the report. 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 Electronically Signed   By: Dahlia Bailiff M.D.   On: 09/01/2021 12:16   Result Date: 09/01/2021 CLINICAL DATA:  Elevated LFTs EXAM: ULTRASOUND ABDOMEN LIMITED RIGHT UPPER QUADRANT COMPARISON:  CT November 21, 2010. FINDINGS: Gallbladder: No gallstones or wall thickening visualized. No sonographic Murphy sign noted by sonographer. Common bile duct: Diameter: 5 mm Liver: No focal lesion identified. Diffusely increased parenchymal echogenicity with  decreased acoustic penetration. Portal vein is patent on color Doppler imaging with normal direction of blood flow towards the liver. Other: None. IMPRESSION: The echogenicity of the liver is increased. This is a nonspecific finding but is most commonly seen with fatty infiltration of the liver. There are no obvious focal liver lesions. Electronically Signed: By: Dahlia Bailiff M.D. On: 08/31/2021 18:27    Labs:  CBC: Recent Labs    09/04/21 0629 09/05/21 0642 09/06/21 0646 09/07/21 0612  WBC 5.6 5.9 6.4 5.7  HGB 12.6 12.7 13.1 12.8  HCT 39.3 38.8 40.6 39.4  PLT 144* 152 169 183    COAGS: Recent Labs    09/01/21 0431 09/02/21 0620 09/04/21 0826 09/05/21 0642 09/06/21 0920 09/07/21 0612  INR 1.0   < > 1.1 1.1 1.0 1.0  APTT 41*  --   --   --   --   --    < > = values in this interval not displayed.    BMP: Recent Labs    09/04/21 0629 09/05/21 0642 09/06/21 0646 09/07/21 0612  NA 137 136 139 136  K 3.8 3.6 3.8 3.2*  CL 102 101 99 98  CO2 '26 26 30 28  '$ GLUCOSE 115* 103*  112* 104*  BUN 22* '15 10 9  '$ CALCIUM 8.7* 8.7* 9.1 8.8*  CREATININE 1.97* 1.42* 1.10* 0.89  GFRNONAA 31* 46* >60 >60    LIVER FUNCTION TESTS: Recent Labs    09/04/21 0629 09/05/21 0642 09/06/21 0646 09/07/21 0612  BILITOT 16.0* 15.3* 16.2* 14.5*  AST 1,168* 1,110* 873* 576*  ALT 1,416* 1,421* 1,312* 969*  ALKPHOS 116 122 131* 142*  PROT 6.5 6.4* 6.9 6.7  ALBUMIN 3.1* 3.1* 3.3* 3.1*    TUMOR MARKERS: No results for input(s): AFPTM, CEA, CA199, CHROMGRNA in the last 8760 hours.  Assessment and Plan:  Hep B Abd pain N/V Elevated LFTs Gi MD asking for random liver biopsy Scheduled for [facility] 09/08/21--- for transfer by ambulance for procedure-- and return after same Risks and benefits of random liver biopsy was discussed with the patient and/or patient's family including, but not limited to bleeding, infection, damage to adjacent structures or low yield requiring additional tests.  All of the questions were answered and there is agreement to proceed.  Consent signed and in chart.    Thank you for this interesting consult.  I greatly enjoyed meeting Shelly Larson and look forward to participating in their care.  A copy of this report was sent to the requesting provider on this date.  Electronically Signed: Lavonia Drafts, PA-C 09/07/2021, 9:06 AM   I spent a total of 20 Minutes    in face to face in clinical consultation, greater than 50% of which was counseling/coordinating care for random liver biopsy

## 2021-09-08 ENCOUNTER — Inpatient Hospital Stay (HOSPITAL_COMMUNITY)
Admission: RE | Admit: 2021-09-08 | Discharge: 2021-09-08 | Disposition: A | Discharge status: Home / Self Care | Payer: 59 | Source: Ambulatory Visit | Attending: Internal Medicine | Admitting: Internal Medicine

## 2021-09-08 DIAGNOSIS — B1910 Unspecified viral hepatitis B without hepatic coma: Secondary | ICD-10-CM | POA: Insufficient documentation

## 2021-09-08 DIAGNOSIS — B169 Acute hepatitis B without delta-agent and without hepatic coma: Secondary | ICD-10-CM | POA: Diagnosis not present

## 2021-09-08 LAB — COMPREHENSIVE METABOLIC PANEL
ALT: 767 U/L — ABNORMAL HIGH (ref 0–44)
AST: 488 U/L — ABNORMAL HIGH (ref 15–41)
Albumin: 3.1 g/dL — ABNORMAL LOW (ref 3.5–5.0)
Alkaline Phosphatase: 144 U/L — ABNORMAL HIGH (ref 38–126)
Anion gap: 7 (ref 5–15)
BUN: 11 mg/dL (ref 6–20)
CO2: 31 mmol/L (ref 22–32)
Calcium: 9 mg/dL (ref 8.9–10.3)
Chloride: 98 mmol/L (ref 98–111)
Creatinine, Ser: 0.99 mg/dL (ref 0.44–1.00)
GFR, Estimated: >60 mL/min (ref >60)
Glucose, Bld: 113 mg/dL — ABNORMAL HIGH (ref 70–99)
Potassium: 3.3 mmol/L — ABNORMAL LOW (ref 3.5–5.1)
Sodium: 136 mmol/L (ref 135–145)
Total Bilirubin: 13 mg/dL — ABNORMAL HIGH (ref 0.3–1.2)
Total Protein: 6.7 g/dL (ref 6.5–8.1)

## 2021-09-08 LAB — CBC WITH DIFFERENTIAL/PLATELET
Basophils Absolute: 0 10*3/uL (ref 0.0–0.1)
Basophils Relative: 0 %
Eosinophils Absolute: 0.3 10*3/uL (ref 0.0–0.5)
Eosinophils Relative: 5 %
HCT: 38.3 % (ref 36.0–46.0)
Hemoglobin: 12.6 g/dL (ref 12.0–15.0)
Lymphocytes Relative: 16 %
Lymphs Abs: 0.9 10*3/uL (ref 0.7–4.0)
MCH: 27.9 pg (ref 26.0–34.0)
MCHC: 32.9 g/dL (ref 30.0–36.0)
MCV: 84.7 fL (ref 80.0–100.0)
Monocytes Absolute: 0.9 10*3/uL (ref 0.1–1.0)
Monocytes Relative: 16 %
Neutro Abs: 3.6 10*3/uL (ref 1.7–7.7)
Neutrophils Relative %: 63 %
Platelets: 178 10*3/uL (ref 150–400)
RBC: 4.52 MIL/uL (ref 3.87–5.11)
RDW: 20.2 % — ABNORMAL HIGH (ref 11.5–15.5)
WBC: 5.7 10*3/uL (ref 4.0–10.5)
nRBC: 0 % (ref 0.0–0.2)

## 2021-09-08 LAB — PROTIME-INR
INR: 1 (ref 0.8–1.2)
Prothrombin Time: 13 seconds (ref 11.4–15.2)

## 2021-09-08 LAB — GLUCOSE, CAPILLARY
Glucose-Capillary: 108 mg/dL — ABNORMAL HIGH (ref 70–99)
Glucose-Capillary: 98 mg/dL (ref 70–99)
Glucose-Capillary: 112 mg/dL — ABNORMAL HIGH (ref 70–99)

## 2021-09-08 LAB — MAGNESIUM: Magnesium: 1.8 mg/dL (ref 1.7–2.4)

## 2021-09-08 MED ORDER — FENTANYL CITRATE (PF) 100 MCG/2ML IJ SOLN
INTRAMUSCULAR | Status: AC
  Filled 2021-09-08: qty 4

## 2021-09-08 MED ORDER — MIDAZOLAM HCL 2 MG/2ML IJ SOLN
INTRAMUSCULAR | Status: AC
  Filled 2021-09-08: qty 4

## 2021-09-08 MED ORDER — LIDOCAINE HCL (PF) 1 % IJ SOLN
INTRAMUSCULAR | Status: AC
  Filled 2021-09-08: qty 30

## 2021-09-08 MED ORDER — FENTANYL CITRATE (PF) 100 MCG/2ML IJ SOLN
INTRAMUSCULAR | Status: DC | PRN
  Administered 2021-09-08: 50 ug via INTRAVENOUS

## 2021-09-08 MED ORDER — GELATIN ABSORBABLE 12-7 MM EX MISC
CUTANEOUS | Status: AC
  Filled 2021-09-08: qty 1

## 2021-09-08 MED ORDER — MIDAZOLAM HCL 2 MG/2ML IJ SOLN
INTRAMUSCULAR | Status: DC | PRN
Start: 2021-09-08 — End: 2021-09-09
  Administered 2021-09-08: 1 mg via INTRAVENOUS

## 2021-09-08 NOTE — Sedation Documentation (Signed)
SBAR given to [facility] at bedside. Pt transported back to [facility].

## 2021-09-08 NOTE — Progress Notes (Signed)
Subjective:  Abdominal pain improving but increased pain some last night after eating. No n/v. BM yesterday.   Objective: Vital signs in last 24 hours: Temp:  [98.3 F (36.8 C)-98.4 F (36.9 C)] 98.3 F (36.8 C) (09/15 0830) Pulse Rate:  [65-75] 65 (09/15 0830) Resp:  [16-18] 18 (09/15 0830) BP: (141-149)/(75-91) 146/91 (09/15 0830) SpO2:  [95 %-98 %] 98 % (09/15 0830) Last BM Date: 09/07/21 General:   Alert,  Well-developed, well-nourished, pleasant and cooperative in NAD Head:  Normocephalic and atraumatic. Eyes:  Sclera clear, no icterus.  Abdomen:  Soft, nontender and nondistended.  Normal bowel sounds, without guarding, and without rebound.   Extremities:  Without clubbing, deformity or edema. Neurologic:  Alert and  oriented x4;  grossly normal neurologically. Skin:  Intact without significant lesions or rashes. Psych:  Alert and cooperative. Normal mood and affect.  Intake/Output from previous day: 09/14 0701 - 09/15 0700 In: 480 [P.O.:480] Out: 1100 [Urine:1100] Intake/Output this shift: No intake/output data recorded.  Lab Results: CBC Recent Labs    09/06/21 0646 09/07/21 0612 09/08/21 0539  WBC 6.4 5.7 5.7  HGB 13.1 12.8 12.6  HCT 40.6 39.4 38.3  MCV 85.7 83.8 84.7  PLT 169 183 178   BMET Recent Labs    09/06/21 0646 09/07/21 0612 09/08/21 0539  NA 139 136 136  K 3.8 3.2* 3.3*  CL 99 98 98  CO2 30 28 31   GLUCOSE 112* 104* 113*  BUN 10 9 11   CREATININE 1.10* 0.89 0.99  CALCIUM 9.1 8.8* 9.0   LFTs Recent Labs    09/06/21 0646 09/07/21 0612 09/08/21 0539  BILITOT 16.2* 14.5* 13.0*  ALKPHOS 131* 142* 144*  AST 873* 576* 488*  ALT 1,312* 969* 767*  PROT 6.9 6.7 6.7  ALBUMIN 3.3* 3.1* 3.1*   No results for input(s): LIPASE in the last 72 hours. PT/INR Recent Labs    09/06/21 0920 09/07/21 0612 09/08/21 0539  LABPROT 13.6 13.3 13.0  INR 1.0 1.0 1.0      Imaging Studies: CT ABDOMEN PELVIS W CONTRAST  Result Date:  09/06/2021 CLINICAL DATA:  Nonlocalized abdominal pain with nausea and vomiting, recent diagnosis of acute hepatitis B infection EXAM: CT ABDOMEN AND PELVIS WITH CONTRAST TECHNIQUE: Multidetector CT imaging of the abdomen and pelvis was performed using the standard protocol following bolus administration of intravenous contrast. CONTRAST:  80 cc Omnipaque 350 COMPARISON:  Ultrasound August 31, 2021 and CT November 21, 2010 FINDINGS: Lower chest: Scattered bilateral subsegmental atelectasis. Small right pleural effusion with adjacent atelectasis versus consolidation. Hepatobiliary: Periportal edema. No suspicious hepatic lesion. Gallbladder surgically absent. No biliary ductal dilation. Pancreas: Within normal limits. Spleen: Within normal limits. Adrenals/Urinary Tract: Adrenal glands are unremarkable. Kidneys are normal, without renal calculi, solid enhancing lesion, or hydronephrosis. Bladder is unremarkable for degree of distension. Stomach/Bowel: Radiopaque enteric contrast traverses the hepatic flexure. Small hiatal hernia otherwise the stomach is unremarkable for degree of distension. No pathologic dilation of small or large bowel. The appendix and terminal ileum appear normal. Colonic anastomotic sutures at the splenic flexure. Colonic diverticulosis without findings of acute diverticulitis. Vascular/Lymphatic: No abdominal aortic aneurysm. The portal, splenic and superior mesenteric veins appear patent. No pathologically enlarged abdominal or pelvic lymph nodes. Reproductive: Uterus and left adnexa are unremarkable. Simple appearing 2.2 cm right ovarian cyst. No follow-up imaging recommended. Note: This recommendation does not apply to premenarchal patients and to those with increased risk (genetic, family history, elevated tumor markers or other high-risk factors) of ovarian  cancer. Reference: JACR 2020 Feb; 17(2):248-254 Other: Trace perihepatic free fluid. No walled off fluid collections. Likely sequela  of subcutaneous injections in the anterior abdominal wall. Trace nonspecific subcutaneous edema. Musculoskeletal: Thoracolumbar spondylosis. No acute osseous abnormality. IMPRESSION: 1. Periportal edema with trace perihepatic free fluid, as can be seen with patient's reported acute hepatitis B infection. 2. Small right pleural effusion with adjacent atelectasis versus consolidation. 3. Colonic diverticulosis without findings of acute diverticulitis. Electronically Signed   By: Dahlia Bailiff M.D.   On: 09/06/2021 14:53   US RENAL  Result Date: 09/02/2021 CLINICAL DATA:  Acute renal failure Hypertension EXAM: RENAL / URINARY TRACT ULTRASOUND COMPLETE COMPARISON:  CT abdomen pelvis 11/21/2010 FINDINGS: Right Kidney: Renal measurements: 13.6 x 4.9 x 7.2 cm = volume: 251 mL. Echogenicity within normal limits. No mass or hydronephrosis visualized. Left Kidney: Renal measurements: 13.2 x 7.1 x 6.6 cm = volume: 324 mL. Echogenicity within normal limits. No mass or hydronephrosis visualized. Bladder: Appears normal for degree of bladder distention. Other: None. IMPRESSION: No significant sonographic abnormality of the kidneys. Electronically Signed   By: Miachel Roux M.D.   On: 09/02/2021 13:33   US Abdomen Limited RUQ (LIVER/GB)  Addendum Date: 09/01/2021   ADDENDUM REPORT: 09/01/2021 12:16 ADDENDUM: Dictation error occurred in the finding section of the report. 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 Electronically Signed   By: Dahlia Bailiff M.D.   On: 09/01/2021 12:16   Result Date: 09/01/2021 CLINICAL DATA:  Elevated LFTs EXAM: ULTRASOUND ABDOMEN LIMITED RIGHT UPPER QUADRANT COMPARISON:  CT November 21, 2010. FINDINGS: Gallbladder: No gallstones or wall thickening visualized. No sonographic Murphy sign noted by sonographer. Common bile duct: Diameter: 5 mm Liver: No focal lesion identified. Diffusely increased parenchymal echogenicity with decreased acoustic penetration. Portal vein is  patent on color Doppler imaging with normal direction of blood flow towards the liver. Other: None. IMPRESSION: The echogenicity of the liver is increased. This is a nonspecific finding but is most commonly seen with fatty infiltration of the liver. There are no obvious focal liver lesions. Electronically Signed: By: Dahlia Bailiff M.D. On: 08/31/2021 18:27  [2 weeks]   Assessment:  Pleasant 50 year old female with history of colon cancer status post segmental resection in 2016, diabetes, hypertension, migraines, remote biliary pancreatitis status post cholecystectomy 2010 presenting for markedly abnormal LFTs with associated upper abdominal pain and nausea.  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  Elevated LFTs: Hepatitis B surface antigen positive, hepatitis B core IgM positive, hepatitis Be+, HBV DNA 369,000.  Currently receiving supportive management for acute hepatitis B.  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  Liver biopsy planned today to exclude concomitant herpetic hepatitis.  Fortunately her synthetic function has remained normal.  Hepatitis D pending.  Over the last 24 hours: T bili 14.5-->13.0, AST 873-->576, ALT 969-->767. Alk phos is trending upward over the last 72 hours: AP 122-->131-->142-->144.   Abdominal pain: EGD this admission with gastritis, biopsies negative for H. pylori.  CT showed periportal edema with trace perihepatic free fluid but no other acute findings.  Abdominal pain continues to improve.  Acute renal failure: Resolved.  Plan: Liver biopsy today. Dr. Laural Golden requesting rush on preliminary results.  Continue supportive measures.   Laureen Ochs. Bernarda Caffey [facility] [phone] 9/15/20229:47 AM    LOS: 7 days

## 2021-09-08 NOTE — Procedures (Signed)
  Procedure: US liver core biopsy   EBL:   minimal Complications:  none immediate  See full dictation in [facility].  Dillard Cannon MD Main # [phone] Pager  [phone]

## 2021-09-08 NOTE — Sedation Documentation (Signed)
Pt taken to nursing station to await [facility] for transport back to [facility].

## 2021-09-08 NOTE — Progress Notes (Signed)
Progress Note    Shelly Larson   [id number]  DOB: 08/11/1971  DOA: 08/31/2021     7  PCP: [facility]  Initial CC: abdominal pain   Hospital Course: 50 year old with a history of hypertension, asthma, colon cancer status post resection in 2016, biliary pancreatitis status post cholecystectomy 2010 referred to emergency department with abnormal liver enzymes.  She was being evaluated for abdominal pain with nausea by her PCP.  This included blood work which revealed elevated LFTs.  In the ED, AST was noted to be 1036, and ALT was noted to be 1476.  Total bilirubin was 13.  RUQ ultrasound noted increased echogenicity of the liver consistent with fatty infiltrate but no other obvious lesions. -Work-up positive for hep B surface antigen and hep B core IgM antibody, also positive for HSV-1  Interval History:  -Feels okay overall, only has mild abdominal discomfort now, plan for biopsy today  Assessment & Plan:  Acute hepatitis B infection.  ?  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 Also positive for HSV 1. - Viral load 300,000. -Gastroenterology following, improving with supportive care, IV Fluids -GI recommended liver biopsy, interventional radiology consulted -Plan for biopsy today at [facility], will transfer back to [facility] following biopsy -CMP  AKI -Resolved  -baseline creatinine ~ 0.9, creatinine peaked at 2.6, improving, likely prerenal - improved with supportive care  HTN -Stable, continue amlodipine and metoprolol, ACE inhibitor due to TC"   Asthma -Well compensated at present  -continue home inhaled medications.   DM2 A1c 6.2 - CBG well controlled . On metformin at home.  Hypokalemia Hypomagnesemia -Replaced  Obesity BMI 36.6  DVT prophylaxis: SCDs  Code Status:   Code Status: Full Code Family Communication: None.  Disposition Plan: Status is: Inpatient  Remains inpatient appropriate  because:IV treatments appropriate due to intensity of illness or inability to take PO and Inpatient level of care appropriate due to severity of illness  Dispo: The patient is from: Home              Anticipated d/c is to: Home              Patient currently is not medically stable to d/c.   Difficult to place patient No   Objective: Blood pressure (!) 146/91, pulse 65, temperature 98.3 F (36.8 C), temperature source Oral, resp. rate 18, height 6' (1.829 m), weight 122.5 kg, SpO2 98 %.  Examination: Obese female awake, Alert, Oriented X 3,  HEENT: no JVD Lungs: Good air movement bilaterally, CTAB CVS: S1S2/RRR Abd: soft, minimal right-sided tenderness, non distended, BS present Extremities: No edema  Consultants:  GI  Procedures:  EGD, 09/02/2021  Data Reviewed: I have personally reviewed following labs and imaging studies Results for orders placed or performed during the hospital encounter of 08/31/21 (from the past 24 hour(s))  Glucose, capillary     Status: Abnormal   Collection Time: 09/07/21  4:56 PM  Result Value Ref Range   Glucose-Capillary 105 (H) 70 - 99 mg/dL   Comment 1 Notify RN    Comment 2 Document in Chart   Glucose, capillary     Status: Abnormal   Collection Time: 09/07/21 10:31 PM  Result Value Ref Range   Glucose-Capillary 108 (H) 70 - 99 mg/dL   Comment 1 Notify RN   CBC with Differential/Platelet     Status: Abnormal   Collection Time: 09/08/21  5:39 AM  Result Value Ref Range   WBC 5.7 4.0 - 10.5 K/uL   RBC 4.52 3.87 - 5.11 MIL/uL   Hemoglobin 12.6 12.0 - 15.0 g/dL   HCT 38.3 36.0 - 46.0 %   MCV 84.7 80.0 - 100.0 fL   MCH 27.9 26.0 - 34.0 pg   MCHC 32.9 30.0 - 36.0 g/dL   RDW 20.2 (H) 11.5 - 15.5 %   Platelets 178 150 - 400 K/uL   nRBC 0.0 0.0 - 0.2 %   Neutrophils Relative % 63 %   Neutro Abs 3.6 1.7 - 7.7 K/uL   Lymphocytes Relative 16 %   Lymphs Abs 0.9 0.7 - 4.0 K/uL   Monocytes Relative 16 %   Monocytes Absolute 0.9 0.1 - 1.0 K/uL    Eosinophils Relative 5 %   Eosinophils Absolute 0.3 0.0 - 0.5 K/uL   Basophils Relative 0 %   Basophils Absolute 0.0 0.0 - 0.1 K/uL   Polychromasia PRESENT    Target Cells PRESENT    Stomatocytes PRESENT   Comprehensive metabolic panel     Status: Abnormal   Collection Time: 09/08/21  5:39 AM  Result Value Ref Range   Sodium 136 135 - 145 mmol/L   Potassium 3.3 (L) 3.5 - 5.1 mmol/L   Chloride 98 98 - 111 mmol/L   CO2 31 22 - 32 mmol/L   Glucose, Bld 113 (H) 70 - 99 mg/dL   BUN 11 6 - 20 mg/dL   Creatinine, Ser 0.99 0.44 - 1.00 mg/dL   Calcium 9.0 8.9 - 10.3 mg/dL   Total Protein 6.7 6.5 - 8.1 g/dL   Albumin 3.1 (L) 3.5 - 5.0 g/dL   AST 488 (H) 15 - 41 U/L   ALT 767 (H) 0 - 44 U/L   Alkaline Phosphatase 144 (H) 38 - 126 U/L   Total Bilirubin 13.0 (H) 0.3 - 1.2 mg/dL   GFR, Estimated >60 >60 mL/min   Anion gap 7 5 - 15  Magnesium     Status: None   Collection Time: 09/08/21  5:39 AM  Result Value Ref Range   Magnesium 1.8 1.7 - 2.4 mg/dL  Protime-INR     Status: None   Collection Time: 09/08/21  5:39 AM  Result Value Ref Range   Prothrombin Time 13.0 11.4 - 15.2 seconds   INR 1.0 0.8 - 1.2  Glucose, capillary     Status: Abnormal   Collection Time: 09/08/21  7:46 AM  Result Value Ref Range   Glucose-Capillary 108 (H) 70 - 99 mg/dL    Recent Results (from the past 240 hour(s))  Resp Panel by RT-PCR (Flu A&B, Covid) Nasopharyngeal Swab     Status: None   Collection Time: 08/31/21  8:25 PM   Specimen: Nasopharyngeal Swab; Nasopharyngeal(NP) swabs in vial transport medium  Result Value Ref Range Status   SARS Coronavirus 2 by RT PCR NEGATIVE NEGATIVE Final    Comment: (NOTE) SARS-CoV-2 target nucleic acids are NOT DETECTED.  The SARS-CoV-2 RNA is generally detectable in upper respiratory specimens during the acute phase of infection. The lowest concentration of SARS-CoV-2 viral copies this assay can detect is 138 copies/mL. A negative result does not preclude  SARS-Cov-2 infection and should not be used as the sole basis for treatment or other patient management decisions. A negative result may occur with  improper specimen collection/handling, submission of specimen other than nasopharyngeal swab, presence of viral mutation(s) within the areas targeted by this assay, and inadequate  number of viral copies(<138 copies/mL). A negative result must be combined with clinical observations, patient history, and epidemiological information. The expected result is Negative.  Fact Sheet for Patients:  EntrepreneurPulse.com.au  Fact Sheet for Healthcare Providers:  IncredibleEmployment.be  This test is no t yet approved or cleared by the [place] FDA and  has been authorized for detection and/or diagnosis of SARS-CoV-2 by FDA under an Emergency Use Authorization (EUA). This EUA will remain  in effect (meaning this test can be used) for the duration of the COVID-19 declaration under Section 564(b)(1) of the Act, 21 U.S.C.section 360bbb-3(b)(1), unless the authorization is terminated  or revoked sooner.       Influenza A by PCR NEGATIVE NEGATIVE Final   Influenza B by PCR NEGATIVE NEGATIVE Final    Comment: (NOTE) The Xpert Xpress SARS-CoV-2/FLU/RSV plus assay is intended as an aid in the diagnosis of influenza from Nasopharyngeal swab specimens and should not be used as a sole basis for treatment. Nasal washings and aspirates are unacceptable for Xpert Xpress SARS-CoV-2/FLU/RSV testing.  Fact Sheet for Patients: EntrepreneurPulse.com.au  Fact Sheet for Healthcare Providers: IncredibleEmployment.be  This test is not yet approved or cleared by the [place] FDA and has been authorized for detection and/or diagnosis of SARS-CoV-2 by FDA under an Emergency Use Authorization (EUA). This EUA will remain in effect (meaning this test can be used) for the duration of  the COVID-19 declaration under Section 564(b)(1) of the Act, 21 U.S.C. section 360bbb-3(b)(1), unless the authorization is terminated or revoked.  Performed at [facility], [address], [place], [place] [address]      Radiology Studies: CT ABDOMEN PELVIS W CONTRAST  Result Date: 09/06/2021 CLINICAL DATA:  Nonlocalized abdominal pain with nausea and vomiting, recent diagnosis of acute hepatitis B infection EXAM: CT ABDOMEN AND PELVIS WITH CONTRAST TECHNIQUE: Multidetector CT imaging of the abdomen and pelvis was performed using the standard protocol following bolus administration of intravenous contrast. CONTRAST:  80 cc Omnipaque 350 COMPARISON:  Ultrasound August 31, 2021 and CT November 21, 2010 FINDINGS: Lower chest: Scattered bilateral subsegmental atelectasis. Small right pleural effusion with adjacent atelectasis versus consolidation. Hepatobiliary: Periportal edema. No suspicious hepatic lesion. Gallbladder surgically absent. No biliary ductal dilation. Pancreas: Within normal limits. Spleen: Within normal limits. Adrenals/Urinary Tract: Adrenal glands are unremarkable. Kidneys are normal, without renal calculi, solid enhancing lesion, or hydronephrosis. Bladder is unremarkable for degree of distension. Stomach/Bowel: Radiopaque enteric contrast traverses the hepatic flexure. Small hiatal hernia otherwise the stomach is unremarkable for degree of distension. No pathologic dilation of small or large bowel. The appendix and terminal ileum appear normal. Colonic anastomotic sutures at the splenic flexure. Colonic diverticulosis without findings of acute diverticulitis. Vascular/Lymphatic: No abdominal aortic aneurysm. The portal, splenic and superior mesenteric veins appear patent. No pathologically enlarged abdominal or pelvic lymph nodes. Reproductive: Uterus and left adnexa are unremarkable. Simple appearing 2.2 cm right ovarian cyst. No follow-up imaging recommended. Note: This  recommendation does not apply to premenarchal patients and to those with increased risk (genetic, family history, elevated tumor markers or other high-risk factors) of ovarian cancer. Reference: JACR 2020 Feb; 17(2):248-254 Other: Trace perihepatic free fluid. No walled off fluid collections. Likely sequela of subcutaneous injections in the anterior abdominal wall. Trace nonspecific subcutaneous edema. Musculoskeletal: Thoracolumbar spondylosis. No acute osseous abnormality. IMPRESSION: 1. Periportal edema with trace perihepatic free fluid, as can be seen with patient's reported acute hepatitis B infection. 2. Small right pleural effusion with adjacent atelectasis versus consolidation. 3. Colonic  diverticulosis without findings of acute diverticulitis. Electronically Signed   By: Dahlia Bailiff M.D.   On: 09/06/2021 14:53   CT ABDOMEN PELVIS W CONTRAST  Final Result    US RENAL  Final Result    US Abdomen Limited RUQ (LIVER/GB)  Final Result  Addendum (preliminary) 1 of 1  ADDENDUM REPORT: 09/01/2021 12:16    ADDENDUM:  Dictation error occurred in the finding section of the report.  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      Electronically Signed    By: Dahlia Bailiff M.D.    On: 09/01/2021 12:16      Final    US BIOPSY (LIVER)    (Results Pending)    Scheduled Meds:  amLODipine  10 mg Oral Daily   fluticasone furoate-vilanterol  1 puff Inhalation q morning   insulin aspart  0-5 Units Subcutaneous QHS   insulin aspart  0-9 Units Subcutaneous TID WC   metoprolol succinate  50 mg Oral q morning   pantoprazole (PROTONIX) IV  40 mg Intravenous QHS   PRN Meds: albuterol, fentaNYL (SUBLIMAZE) injection, hyoscyamine, ondansetron (ZOFRAN) IV, oxyCODONE, promethazine (PHENERGAN) injection (IM or IVPB) Continuous Infusions:  sodium chloride 50 mL/hr at 09/07/21 1110   promethazine (PHENERGAN) injection (IM or IVPB) Stopped (09/07/21 0348)     LOS: 7 days   Time spent: 25 minutes.  Domenic Polite, MD [facility] 09/08/2021, 11:31 AM

## 2021-09-09 DIAGNOSIS — R7989 Other specified abnormal findings of blood chemistry: Secondary | ICD-10-CM | POA: Diagnosis not present

## 2021-09-09 DIAGNOSIS — B169 Acute hepatitis B without delta-agent and without hepatic coma: Secondary | ICD-10-CM | POA: Diagnosis not present

## 2021-09-09 DIAGNOSIS — K769 Liver disease, unspecified: Secondary | ICD-10-CM | POA: Diagnosis not present

## 2021-09-09 LAB — CBC WITH DIFFERENTIAL/PLATELET
Basophils Absolute: 0 10*3/uL (ref 0.0–0.1)
Basophils Relative: 0 %
Eosinophils Absolute: 0.1 10*3/uL (ref 0.0–0.5)
Eosinophils Relative: 1 %
HCT: 35.9 % — ABNORMAL LOW (ref 36.0–46.0)
Hemoglobin: 11.8 g/dL — ABNORMAL LOW (ref 12.0–15.0)
Lymphocytes Relative: 10 %
Lymphs Abs: 0.8 10*3/uL (ref 0.7–4.0)
MCH: 28.2 pg (ref 26.0–34.0)
MCHC: 32.9 g/dL (ref 30.0–36.0)
MCV: 85.9 fL (ref 80.0–100.0)
Monocytes Absolute: 0.5 10*3/uL (ref 0.1–1.0)
Monocytes Relative: 7 %
Neutro Abs: 6.2 10*3/uL (ref 1.7–7.7)
Neutrophils Relative %: 82 %
Platelets: 201 10*3/uL (ref 150–400)
RBC: 4.18 MIL/uL (ref 3.87–5.11)
RDW: 20.4 % — ABNORMAL HIGH (ref 11.5–15.5)
WBC: 7.6 10*3/uL (ref 4.0–10.5)
nRBC: 0 % (ref 0.0–0.2)

## 2021-09-09 LAB — MAGNESIUM: Magnesium: 1.8 mg/dL (ref 1.7–2.4)

## 2021-09-09 LAB — COMPREHENSIVE METABOLIC PANEL
ALT: 695 U/L — ABNORMAL HIGH (ref 0–44)
AST: 442 U/L — ABNORMAL HIGH (ref 15–41)
Albumin: 3.2 g/dL — ABNORMAL LOW (ref 3.5–5.0)
Alkaline Phosphatase: 151 U/L — ABNORMAL HIGH (ref 38–126)
Anion gap: 10 (ref 5–15)
BUN: 20 mg/dL (ref 6–20)
CO2: 27 mmol/L (ref 22–32)
Calcium: 8.8 mg/dL — ABNORMAL LOW (ref 8.9–10.3)
Chloride: 98 mmol/L (ref 98–111)
Creatinine, Ser: 1.35 mg/dL — ABNORMAL HIGH (ref 0.44–1.00)
GFR, Estimated: 49 mL/min — ABNORMAL LOW (ref >60)
Glucose, Bld: 116 mg/dL — ABNORMAL HIGH (ref 70–99)
Potassium: 3.6 mmol/L (ref 3.5–5.1)
Sodium: 135 mmol/L (ref 135–145)
Total Bilirubin: 10.3 mg/dL — ABNORMAL HIGH (ref 0.3–1.2)
Total Protein: 6.8 g/dL (ref 6.5–8.1)

## 2021-09-09 LAB — GLUCOSE, CAPILLARY
Glucose-Capillary: 92 mg/dL (ref 70–99)
Glucose-Capillary: 141 mg/dL — ABNORMAL HIGH (ref 70–99)

## 2021-09-09 LAB — PROTIME-INR
INR: 1 (ref 0.8–1.2)
Prothrombin Time: 13 seconds (ref 11.4–15.2)

## 2021-09-09 MED ORDER — SODIUM CHLORIDE 0.9 % IV SOLN: INTRAVENOUS | Status: DC

## 2021-09-09 MED ORDER — PANTOPRAZOLE SODIUM 40 MG PO TBEC
40.0000 mg | DELAYED_RELEASE_TABLET | Freq: Every day | ORAL | Status: DC
  Administered 2021-09-10 – 2021-09-11 (×2): 40 mg via ORAL
  Filled 2021-09-09 (×2): qty 1

## 2021-09-09 MED ORDER — HYDROMORPHONE HCL 1 MG/ML IJ SOLN
1.0000 mg | Freq: Once | INTRAMUSCULAR | Status: AC
  Administered 2021-09-09: 1 mg via INTRAVENOUS
  Filled 2021-09-09: qty 1

## 2021-09-09 MED ORDER — SODIUM CHLORIDE 0.9 % IV SOLN: INTRAVENOUS | Status: AC

## 2021-09-09 NOTE — Progress Notes (Addendum)
Subjective: Doing ok this morning.  Reports her abdominal pain continues to improve.  About 1-2 out of 10 in severity.  Some increased right-sided soreness following liver biopsy yesterday.  Bowels are moving well.  Denies BRBPR or melena.  Tolerating her diet well.  States she did not have much to eat or drink yesterday as she was asleep most of the day due to anesthesia.  No mental status changes.  Objective: Vital signs in last 24 hours: Temp:  [97.8 F (36.6 C)-98.4 F (36.9 C)] 98.4 F (36.9 C) (09/16 0525) Pulse Rate:  [56-79] 75 (09/16 0525) Resp:  [18-24] 18 (09/16 0525) BP: (94-157)/(62-105) 135/89 (09/16 0525) SpO2:  [92 %-98 %] 92 % (09/16 0726) Last BM Date: 09/07/21 General: Alert and oriented, pleasant, NAD Head:  Normocephalic and atraumatic. Eyes:  + Scleral icterus.  Conjuctiva pink.  Abdomen:  Bowel sounds present, soft, non-distended.  Mild to moderate TTP across the upper abdomen, mild TTP across lower abdomen.  No HSM or hernias noted. No rebound or guarding. No masses appreciated Band-Aid in place in right upper abdomen/flank area.  Msk:  Symmetrical without gross deformities. Normal posture. Extremities:  Without edema. Neurologic:  Alert and  oriented x4;  grossly normal neurologically. Psych: Normal mood and affect.  Intake/Output from previous day: 09/15 0701 - 09/16 0700 In: 1513.4 [I.V.:1513.4] Out: -  Intake/Output this shift: Total I/O In: 240 [P.O.:240] Out: -   Lab Results: Recent Labs    09/07/21 0612 09/08/21 0539 09/09/21 0620  WBC 5.7 5.7 7.6  HGB 12.8 12.6 11.8*  HCT 39.4 38.3 35.9*  PLT 183 178 201   BMET Recent Labs    09/07/21 0612 09/08/21 0539 09/09/21 0620  NA 136 136 135  K 3.2* 3.3* 3.6  CL 98 98 98  CO2 _0 GLUCOSE 104* 113* 116*  BUN _1 CREATININE 0.89 0.99 1.35*  CALCIUM 8.8* 9.0 8.8*   LFT Recent Labs    09/07/21 0612 09/08/21 0539 09/09/21 0620  PROT 6.7 6.7 6.8  ALBUMIN 3.1* 3.1* 3.2*   AST 576* 488* 442*  ALT 969* 767* 695*  ALKPHOS 142* 144* 151*  BILITOT 14.5* 13.0* 10.3*   PT/INR Recent Labs    09/08/21 0539 09/09/21 0844  LABPROT 13.0 13.0  INR 1.0 1.0    Studies/Results: US BIOPSY (LIVER)  Result Date: 09/08/2021 CLINICAL DATA:  Transaminitis, hepatitis B EXAM: ULTRASOUND-GUIDED CORE LIVER BIOPSY TECHNIQUE: An ultrasound guided liver biopsy was thoroughly discussed with the patient and questions were answered. The benefits, risks, alternatives, and complications were also discussed. The patient understands and wishes to proceed with the procedure. A verbal as well as written consent was obtained. Survey ultrasound of the liver was performed and an appropriate skin entry site was determined. Skin site was marked, prepped with Betadine, and draped in usual sterile fashion, and infiltrated locally with 1% lidocaine. Intravenous Fentanyl 138mg and Versed 260mwere administered as conscious sedation during continuous monitoring of the patient's level of consciousness and physiological / cardiorespiratory status by the radiology RN, with a total moderate sedation time of 10 minutes. A 17 gauge trocar needle was advanced under ultrasound guidance into the liver. 3 solid-appearing coaxial 18gauge core samples were then obtained through the guide needle. The guide needle was removed. Post procedure scans demonstrate no apparent complication. COMPLICATIONS: COMPLICATIONS None immediate FINDINGS: Limited ultrasound of the liver shows no focal lesion or biliary ductal dilatation. Representative core biopsy samples obtained from  the right lobe as above. IMPRESSION: 1. Technically successful ultrasound guided core liver biopsy. Electronically Signed   By: Shelly Larson M.D.   On: 09/08/2021 15:49    Assessment: 50 year old female with history of colon cancer status post segmental resection in 2016, diabetes, hypertension, migraines, remote biliary pancreatitis status post cholecystectomy  2010 presenting for markedly abnormal LFTs with associated upper abdominal pain and nausea.  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  Elevated LFTs: Hepatitis B surface antigen positive, hepatitis B core IgM positive, hepatitis Be+, HBV DNA 369,000.  Currently receiving supportive management for acute hepatitis B.  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  Liver biopsy 9/15 to exclude concomitant herpetic hepatitis, pathology pending.  Fortunately her synthetic function has remained normal, will recheck today. Hepatitis D pending.  AST/ALT and T bili continue to slowly improve.  Over the last 24 hours: AST 448> 442, ALT 767> 695, T bili 13> 10.3.  Alk phos has been trending up slowly over the last 4 days: 131> 142> 144> 151.  Abdominal pain: EGD this admission with gastritis, biopsies negative for H. pylori.  CT showed periportal edema with trace perihepatic free fluid but no other acute findings.  Abdominal pain continues to improve and may be secondary to acute hepatitis.   Acute renal failure: Cr returned to normal on 9/14.  Creatinine bumped today to 1.35 from 0.99 yesterday.  Low-dose IV fluids were discontinued yesterday around noon, and patient reports she did not eat or drink much yesterday as she was sleeping secondary to anesthesia with liver biopsy. May benefit from additional IV fluids today. Will defer to hospitalist.   Plan: Follow-up on liver biopsy pathology.  Dr. Laural Golden requested rush on preliminary results yesterday. Recheck INR today. Continue supportive measures.  Would  likely benefit from rehydration today with IV fluids in light of increased Cr. Will defer to hospitalist.    Addendum: Spoke with [facility].  Pathologist is waiting on special stains, then will read.  They are aware to call results to Dr. Abbey Larson.    LOS: 8 days    09/09/2021, 10:39 AM   Shelly Altes, PA-C [facility]

## 2021-09-09 NOTE — Progress Notes (Signed)
Progress Note    Shelly Larson   [id number]  DOB: [birth year]  DOA: 08/31/2021     8  PCP: [facility]  Initial CC: abdominal pain   Hospital Course: 50 year old with a history of hypertension, asthma, colon cancer status post resection in 2016, biliary pancreatitis status post cholecystectomy 2010 referred to emergency department with abnormal liver enzymes.  She was being evaluated for abdominal pain with nausea by her PCP.  This included blood work which revealed elevated LFTs.  In the ED, AST was noted to be 1036, and ALT was noted to be 1476.  Total bilirubin was 13.  RUQ ultrasound noted increased echogenicity of the liver consistent with fatty infiltrate but no other obvious lesions. -Work-up positive for hep B surface antigen and hep B core IgM antibody, also positive for HSV-1  Interval History:  -Feels okay overall, only has mild abdominal discomfort now, plan for biopsy today  Assessment & Plan:  Acute hepatitis B infection.  ?  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 Also positive for HSV 1. - Viral load 300,000. -Gastroenterology following, improving with supportive care, IV Fluids -GI recommended liver biopsy, interventional radiology consulted -Underwent liver biopsy at [facility] yesterday, GI requested [facility] on preliminary results -LFTs improving, continue to trend  AKI -Resolved  -baseline creatinine ~ 0.9, creatinine peaked at 2.6, improving, likely prerenal -Mild bump in creatinine today, will add gentle fluids for 12 hours -BMP in a.m.  HTN -Stable, continue amlodipine and metoprolol, ACE inhibitor on hold   Asthma -Well compensated at present  -continue home inhaled medications.   DM2 A1c 6.2 - CBG well controlled . On metformin at home.  Hypokalemia Hypomagnesemia -Replaced  Obesity BMI 36.6  DVT prophylaxis: SCDs due to liver biopsy yesterday  Code Status:   Code Status:  Full Code Family Communication: None.  Disposition Plan: Status is: Inpatient  Remains inpatient appropriate because:IV treatments appropriate due to intensity of illness or inability to take PO and Inpatient level of care appropriate due to severity of illness  Dispo: The patient is from: Home              Anticipated d/c is to: Home              Patient currently is not medically stable to d/c.   Difficult to place patient No   Objective: Blood pressure 122/74, pulse 65, temperature 98.2 F (36.8 C), temperature source Oral, resp. rate 16, height 6' (1.829 m), weight 122.5 kg, SpO2 91 %.  Examination: Obese pleasant female sitting up in bed, AAOx3, no distress HEENT: No icterus, no JVD CVS: S1-S2, regular rate rhythm Lungs: Clear bilaterally Abdomen: Soft, minimal right-sided tenderness, nondistended, bowel sounds present Extremities: No edema   Consultants:  GI  Procedures:  EGD, 09/02/2021  Data Reviewed: I have personally reviewed following labs and imaging studies Results for orders placed or performed during the hospital encounter of 08/31/21 (from the past 24 hour(s))  Glucose, capillary     Status: Abnormal   Collection Time: 09/08/21  4:12 PM  Result Value Ref Range   Glucose-Capillary 112 (H) 70 - 99 mg/dL  CBC with Differential/Platelet     Status: Abnormal   Collection Time: 09/09/21  6:20 AM  Result Value Ref Range   WBC 7.6 4.0 - 10.5 K/uL   RBC 4.18 3.87 - 5.11 MIL/uL   Hemoglobin 11.8 (L) 12.0 - 15.0  g/dL   HCT 35.9 (L) 36.0 - 46.0 %   MCV 85.9 80.0 - 100.0 fL   MCH 28.2 26.0 - 34.0 pg   MCHC 32.9 30.0 - 36.0 g/dL   RDW 20.4 (H) 11.5 - 15.5 %   Platelets 201 150 - 400 K/uL   nRBC 0.0 0.0 - 0.2 %   Neutrophils Relative % 82 %   Neutro Abs 6.2 1.7 - 7.7 K/uL   Lymphocytes Relative 10 %   Lymphs Abs 0.8 0.7 - 4.0 K/uL   Monocytes Relative 7 %   Monocytes Absolute 0.5 0.1 - 1.0 K/uL   Eosinophils Relative 1 %   Eosinophils Absolute 0.1 0.0 - 0.5  K/uL   Basophils Relative 0 %   Basophils Absolute 0.0 0.0 - 0.1 K/uL   WBC Morphology VACUOLATED NEUTROPHILS    Smear Review PLATELET COUNT CONFIRMED BY SMEAR   Comprehensive metabolic panel     Status: Abnormal   Collection Time: 09/09/21  6:20 AM  Result Value Ref Range   Sodium 135 135 - 145 mmol/L   Potassium 3.6 3.5 - 5.1 mmol/L   Chloride 98 98 - 111 mmol/L   CO2 27 22 - 32 mmol/L   Glucose, Bld 116 (H) 70 - 99 mg/dL   BUN 20 6 - 20 mg/dL   Creatinine, Ser 1.35 (H) 0.44 - 1.00 mg/dL   Calcium 8.8 (L) 8.9 - 10.3 mg/dL   Total Protein 6.8 6.5 - 8.1 g/dL   Albumin 3.2 (L) 3.5 - 5.0 g/dL   AST 442 (H) 15 - 41 U/L   ALT 695 (H) 0 - 44 U/L   Alkaline Phosphatase 151 (H) 38 - 126 U/L   Total Bilirubin 10.3 (H) 0.3 - 1.2 mg/dL   GFR, Estimated 49 (L) >60 mL/min   Anion gap 10 5 - 15  Magnesium     Status: None   Collection Time: 09/09/21  6:20 AM  Result Value Ref Range   Magnesium 1.8 1.7 - 2.4 mg/dL  Glucose, capillary     Status: None   Collection Time: 09/09/21  7:46 AM  Result Value Ref Range   Glucose-Capillary 92 70 - 99 mg/dL  Protime-INR     Status: None   Collection Time: 09/09/21  8:44 AM  Result Value Ref Range   Prothrombin Time 13.0 11.4 - 15.2 seconds   INR 1.0 0.8 - 1.2  Glucose, capillary     Status: Abnormal   Collection Time: 09/09/21 11:34 AM  Result Value Ref Range   Glucose-Capillary 141 (H) 70 - 99 mg/dL    Recent Results (from the past 240 hour(s))  Resp Panel by RT-PCR (Flu A&B, Covid) Nasopharyngeal Swab     Status: None   Collection Time: 08/31/21  8:25 PM   Specimen: Nasopharyngeal Swab; Nasopharyngeal(NP) swabs in vial transport medium  Result Value Ref Range Status   SARS Coronavirus 2 by RT PCR NEGATIVE NEGATIVE Final    Comment: (NOTE) SARS-CoV-2 target nucleic acids are NOT DETECTED.  The SARS-CoV-2 RNA is generally detectable in upper respiratory specimens during the acute phase of infection. The lowest concentration of SARS-CoV-2  viral copies this assay can detect is 138 copies/mL. A negative result does not preclude SARS-Cov-2 infection and should not be used as the sole basis for treatment or other patient management decisions. A negative result may occur with  improper specimen collection/handling, submission of specimen other than nasopharyngeal swab, presence of viral mutation(s) within the areas targeted by this  assay, and inadequate number of viral copies(<138 copies/mL). A negative result must be combined with clinical observations, patient history, and epidemiological information. The expected result is Negative.  Fact Sheet for Patients:  EntrepreneurPulse.com.au  Fact Sheet for Healthcare Providers:  IncredibleEmployment.be  This test is no t yet approved or cleared by the [place] FDA and  has been authorized for detection and/or diagnosis of SARS-CoV-2 by FDA under an Emergency Use Authorization (EUA). This EUA will remain  in effect (meaning this test can be used) for the duration of the COVID-19 declaration under Section 564(b)(1) of the Act, 21 U.S.C.section 360bbb-3(b)(1), unless the authorization is terminated  or revoked sooner.       Influenza A by PCR NEGATIVE NEGATIVE Final   Influenza B by PCR NEGATIVE NEGATIVE Final    Comment: (NOTE) The Xpert Xpress SARS-CoV-2/FLU/RSV plus assay is intended as an aid in the diagnosis of influenza from Nasopharyngeal swab specimens and should not be used as a sole basis for treatment. Nasal washings and aspirates are unacceptable for Xpert Xpress SARS-CoV-2/FLU/RSV testing.  Fact Sheet for Patients: EntrepreneurPulse.com.au  Fact Sheet for Healthcare Providers: IncredibleEmployment.be  This test is not yet approved or cleared by the [place] FDA and has been authorized for detection and/or diagnosis of SARS-CoV-2 by FDA under an Emergency Use Authorization  (EUA). This EUA will remain in effect (meaning this test can be used) for the duration of the COVID-19 declaration under Section 564(b)(1) of the Act, 21 U.S.C. section 360bbb-3(b)(1), unless the authorization is terminated or revoked.  Performed at [facility], [address], [place], [place] [address]      Radiology Studies: US BIOPSY (LIVER)  Result Date: 09/08/2021 CLINICAL DATA:  Transaminitis, hepatitis B EXAM: ULTRASOUND-GUIDED CORE LIVER BIOPSY TECHNIQUE: An ultrasound guided liver biopsy was thoroughly discussed with the patient and questions were answered. The benefits, risks, alternatives, and complications were also discussed. The patient understands and wishes to proceed with the procedure. A verbal as well as written consent was obtained. Survey ultrasound of the liver was performed and an appropriate skin entry site was determined. Skin site was marked, prepped with Betadine, and draped in usual sterile fashion, and infiltrated locally with 1% lidocaine. Intravenous Fentanyl 169mg and Versed '2mg'$  were administered as conscious sedation during continuous monitoring of the patient's level of consciousness and physiological / cardiorespiratory status by the radiology RN, with a total moderate sedation time of 10 minutes. A 17 gauge trocar needle was advanced under ultrasound guidance into the liver. 3 solid-appearing coaxial 18gauge core samples were then obtained through the guide needle. The guide needle was removed. Post procedure scans demonstrate no apparent complication. COMPLICATIONS: COMPLICATIONS None immediate FINDINGS: Limited ultrasound of the liver shows no focal lesion or biliary ductal dilatation. Representative core biopsy samples obtained from the right lobe as above. IMPRESSION: 1. Technically successful ultrasound guided core liver biopsy. Electronically Signed   By: DLucrezia EuropeM.D.   On: 09/08/2021 15:49   [place]BIOPSY (LIVER)  Final Result    CT ABDOMEN PELVIS W  CONTRAST  Final Result    [place]NAL  Final Result    [place]domen Limited RUQ (LIVER/GB)  Final Result  Addendum (preliminary) 1 of 1  ADDENDUM REPORT: 09/01/2021 12:16    ADDENDUM:  Dictation error occurred in the finding section of the report.  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      Electronically Signed    By: JAndree MoroD.  On: 09/01/2021 12:16      Final      Scheduled Meds:  amLODipine  10 mg Oral Daily   fluticasone furoate-vilanterol  1 puff Inhalation q morning   metoprolol succinate  50 mg Oral q morning   pantoprazole (PROTONIX) IV  40 mg Intravenous QHS   PRN Meds: albuterol, fentaNYL (SUBLIMAZE) injection, hyoscyamine, ondansetron (ZOFRAN) IV, oxyCODONE, promethazine (PHENERGAN) injection (IM or IVPB) Continuous Infusions:  sodium chloride 75 mL/hr at 09/09/21 1235   promethazine (PHENERGAN) injection (IM or IVPB) Stopped (09/07/21 0348)     LOS: 8 days  Time spent: 25 minutes.  Domenic Polite, MD [facility] 09/09/2021, 2:22 PM

## 2021-09-10 DIAGNOSIS — R7989 Other specified abnormal findings of blood chemistry: Secondary | ICD-10-CM

## 2021-09-10 DIAGNOSIS — K769 Liver disease, unspecified: Secondary | ICD-10-CM | POA: Diagnosis not present

## 2021-09-10 DIAGNOSIS — B169 Acute hepatitis B without delta-agent and without hepatic coma: Secondary | ICD-10-CM | POA: Diagnosis not present

## 2021-09-10 LAB — COMPREHENSIVE METABOLIC PANEL
ALT: 587 U/L — ABNORMAL HIGH (ref 0–44)
AST: 370 U/L — ABNORMAL HIGH (ref 15–41)
Albumin: 3.2 g/dL — ABNORMAL LOW (ref 3.5–5.0)
Alkaline Phosphatase: 164 U/L — ABNORMAL HIGH (ref 38–126)
Anion gap: <3 — ABNORMAL LOW (ref 5–15)
BUN: 17 mg/dL (ref 6–20)
CO2: 32 mmol/L (ref 22–32)
Calcium: 8.3 mg/dL — ABNORMAL LOW (ref 8.9–10.3)
Chloride: 101 mmol/L (ref 98–111)
Creatinine, Ser: 1.08 mg/dL — ABNORMAL HIGH (ref 0.44–1.00)
GFR, Estimated: >60 mL/min (ref >60)
Glucose, Bld: 114 mg/dL — ABNORMAL HIGH (ref 70–99)
Potassium: 3.4 mmol/L — ABNORMAL LOW (ref 3.5–5.1)
Sodium: 134 mmol/L — ABNORMAL LOW (ref 135–145)
Total Bilirubin: 8.1 mg/dL — ABNORMAL HIGH (ref 0.3–1.2)
Total Protein: 6.7 g/dL (ref 6.5–8.1)

## 2021-09-10 LAB — CBC
HCT: 34.2 % — ABNORMAL LOW (ref 36.0–46.0)
Hemoglobin: 11.1 g/dL — ABNORMAL LOW (ref 12.0–15.0)
MCH: 28.2 pg (ref 26.0–34.0)
MCHC: 32.5 g/dL (ref 30.0–36.0)
MCV: 87 fL (ref 80.0–100.0)
Platelets: 182 10*3/uL (ref 150–400)
RBC: 3.93 MIL/uL (ref 3.87–5.11)
RDW: 19.9 % — ABNORMAL HIGH (ref 11.5–15.5)
WBC: 6.4 10*3/uL (ref 4.0–10.5)
nRBC: 0 % (ref 0.0–0.2)

## 2021-09-10 LAB — ANTINUCLEAR ANTIBODIES, IFA: ANA Ab, IFA: NEGATIVE

## 2021-09-10 MED ORDER — SODIUM CHLORIDE 0.9 % IV SOLN: INTRAVENOUS | Status: AC

## 2021-09-10 NOTE — Progress Notes (Signed)
Progress Note    Shelly Larson   [id number]  DOB: [birth year]  DOA: 08/31/2021     9  [facility]P: [facility]  Initial CC: abdominal pain   Hospital Course: 50 year old with a history of hypertension, asthma, colon cancer status post resection in 2016, biliary pancreatitis status post cholecystectomy 2010 referred to emergency department with abnormal liver enzymes.  She was being evaluated for abdominal pain with nausea by her PCP.  This included blood work which revealed elevated LFTs.  In the ED, AST was noted to be 1036, and ALT was noted to be 1476.  Total bilirubin was 13.  RUQ ultrasound noted increased echogenicity of the liver consistent with fatty infiltrate but no other obvious lesions. -Work-up positive for hep B surface antigen and hep B core IgM antibody, also positive for HSV-1  Interval History:  Continues to have intermittent episode of nausea and abdominal pain.  Overall improving.  Assessment & Plan:  Acute hepatitis B infection.  ?  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 Also positive for HSV 1. - Viral load 300,000. -GI service recommending continue supportive care and analgesics. -Underwent liver biopsy at [facility] on 09/08/2021 with pathology preliminary results suggesting viral hepatitis. -Final results still pending. -LFTs continue improving, continue to trend -Attempting control of patient's pain with oral medications; hopefully home on 09/11/2021  AKI -baseline creatinine ~ 0.9, creatinine peaked at 2.6,  -continue minimizing nephrotoxic agents and follow renal function trend. -Continue to maintain adequate hydration -Creatinine 1.08 currently.  HTN -Stable, continue amlodipine and metoprolol -Continue holding ACE inhibitor.  -Continue to follow vital signs; blood pressure currently stable.  Asthma -Well compensated at present  -Patient denies shortness of breath or  wheezing. -continue home inhaled medications.   DM2 -A1c 6.2  -CBG well controlled .  -On metformin at home. -Continue sliding scale insulin while inpatient.  Hypokalemia/Hypomagnesemia -In the setting of GI losses and decreased oral intake. -Repleted -Continue to follow electrolytes trend.  Obesity -BMI 36.6 -Low calorie diet, portion control and increase physical activity discussed with patient.  DVT prophylaxis: SCDs due to liver biopsy yesterday  Code Status:   Code Status: Full Code Family Communication: No family at bedside.  Patient reports she was updated over the phone by herself.  Disposition Plan: Status is: Inpatient  Remains inpatient appropriate because:IV treatments appropriate due to intensity of illness or inability to take PO and Inpatient level of care appropriate due to severity of illness  Dispo: The patient is from: Home              Anticipated d/c is to: Home              Patient currently is not medically stable to d/c.   Difficult to place patient No   Objective/subjective: Blood pressure 138/81, pulse 77, temperature 98.2 F (36.8 C), resp. rate 16, height 6' (1.829 m), weight 122.5 kg, SpO2 95 %.  In mild distress secondary to still ongoing intermittent abdominal pain.  No active vomiting.  Patient reports an intermittent nausea.  Examination: General exam: Alert, awake, oriented x 3; reporting intermittent nausea, no active vomiting.  Still complaining of abdominal pain. Respiratory system: Clear to auscultation. Respiratory effort normal.  No requiring oxygen supplementation.  Good saturation on room air. Cardiovascular system:RRR. No murmurs, rubs, gallops. Gastrointestinal system: Abdomen is nondistended, soft and nontender. No organomegaly or masses felt. Normal bowel sounds heard.  Central nervous system: Alert and oriented. No focal neurological deficits. Extremities: No cyanosis or clubbing. Skin: No petechiae. Psychiatry: Judgement  and insight appear normal. Mood & affect appropriate.   Consultants:  GI  Procedures:  EGD, 09/02/2021  Data Reviewed: I have personally reviewed following labs and imaging studies Results for orders placed or performed during the hospital encounter of 08/31/21 (from the past 24 hour(s))  CBC     Status: Abnormal   Collection Time: 09/10/21  5:34 AM  Result Value Ref Range   WBC 6.4 4.0 - 10.5 K/uL   RBC 3.93 3.87 - 5.11 MIL/uL   Hemoglobin 11.1 (L) 12.0 - 15.0 g/dL   HCT 34.2 (L) 36.0 - 46.0 %   MCV 87.0 80.0 - 100.0 fL   MCH 28.2 26.0 - 34.0 pg   MCHC 32.5 30.0 - 36.0 g/dL   RDW 19.9 (H) 11.5 - 15.5 %   Platelets 182 150 - 400 K/uL   nRBC 0.0 0.0 - 0.2 %  Comprehensive metabolic panel     Status: Abnormal   Collection Time: 09/10/21  5:34 AM  Result Value Ref Range   Sodium 134 (L) 135 - 145 mmol/L   Potassium 3.4 (L) 3.5 - 5.1 mmol/L   Chloride 101 98 - 111 mmol/L   CO2 32 22 - 32 mmol/L   Glucose, Bld 114 (H) 70 - 99 mg/dL   BUN 17 6 - 20 mg/dL   Creatinine, Ser 1.08 (H) 0.44 - 1.00 mg/dL   Calcium 8.3 (L) 8.9 - 10.3 mg/dL   Total Protein 6.7 6.5 - 8.1 g/dL   Albumin 3.2 (L) 3.5 - 5.0 g/dL   AST 370 (H) 15 - 41 U/L   ALT 587 (H) 0 - 44 U/L   Alkaline Phosphatase 164 (H) 38 - 126 U/L   Total Bilirubin 8.1 (H) 0.3 - 1.2 mg/dL   GFR, Estimated >60 >60 mL/min   Anion gap <3 (L) 5 - 15    Recent Results (from the past 240 hour(s))  Resp Panel by RT-PCR (Flu A&B, Covid) Nasopharyngeal Swab     Status: None   Collection Time: 08/31/21  8:25 PM   Specimen: Nasopharyngeal Swab; Nasopharyngeal(NP) swabs in vial transport medium  Result Value Ref Range Status   SARS Coronavirus 2 by RT PCR NEGATIVE NEGATIVE Final    Comment: (NOTE) SARS-CoV-2 target nucleic acids are NOT DETECTED.  The SARS-CoV-2 RNA is generally detectable in upper respiratory specimens during the acute phase of infection. The lowest concentration of SARS-CoV-2 viral copies this assay can detect is 138  copies/mL. A negative result does not preclude SARS-Cov-2 infection and should not be used as the sole basis for treatment or other patient management decisions. A negative result may occur with  improper specimen collection/handling, submission of specimen other than nasopharyngeal swab, presence of viral mutation(s) within the areas targeted by this assay, and inadequate number of viral copies(<138 copies/mL). A negative result must be combined with clinical observations, patient history, and epidemiological information. The expected result is Negative.  Fact Sheet for Patients:  EntrepreneurPulse.com.au  Fact Sheet for Healthcare Providers:  IncredibleEmployment.be  This test is no t yet approved or cleared by the [place] FDA and  has been authorized for detection and/or diagnosis of SARS-CoV-2 by FDA under an Emergency Use Authorization (EUA). This EUA will remain  in effect (meaning this test can be used) for the duration of the COVID-19 declaration under Section 564(b)(1) of the Act, 21 U.S.C.section 360bbb-3(b)(1),  unless the authorization is terminated  or revoked sooner.       Influenza A by PCR NEGATIVE NEGATIVE Final   Influenza B by PCR NEGATIVE NEGATIVE Final    Comment: (NOTE) The Xpert Xpress SARS-CoV-2/FLU/RSV plus assay is intended as an aid in the diagnosis of influenza from Nasopharyngeal swab specimens and should not be used as a sole basis for treatment. Nasal washings and aspirates are unacceptable for Xpert Xpress SARS-CoV-2/FLU/RSV testing.  Fact Sheet for Patients: EntrepreneurPulse.com.au  Fact Sheet for Healthcare Providers: IncredibleEmployment.be  This test is not yet approved or cleared by the [place] FDA and has been authorized for detection and/or diagnosis of SARS-CoV-2 by FDA under an Emergency Use Authorization (EUA). This EUA will remain in effect (meaning  this test can be used) for the duration of the COVID-19 declaration under Section 564(b)(1) of the Act, 21 U.S.C. section 360bbb-3(b)(1), unless the authorization is terminated or revoked.  Performed at [facility], [address] [place], [place] [address]      Radiology Studies: No results found. US BIOPSY (LIVER)  Final Result    CT ABDOMEN PELVIS W CONTRAST  Final Result    US RENAL  Final Result    US Abdomen Limited RUQ (LIVER/GB)  Final Result  Addendum (preliminary) 1 of 1  ADDENDUM REPORT: 09/01/2021 12:16    ADDENDUM:  Dictation error occurred in the finding section of the report.  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      Electronically Signed    By: Dahlia Bailiff M.D.    On: 09/01/2021 12:16      Final      Scheduled Meds:  amLODipine  10 mg Oral Daily   fluticasone furoate-vilanterol  1 puff Inhalation q morning   metoprolol succinate  50 mg Oral q morning   pantoprazole  40 mg Oral Daily   PRN Meds: albuterol, fentaNYL (SUBLIMAZE) injection, hyoscyamine, ondansetron (ZOFRAN) IV, oxyCODONE, promethazine (PHENERGAN) injection (IM or IVPB)  Continuous Infusions:  sodium chloride 75 mL/hr at 09/10/21 1058   promethazine (PHENERGAN) injection (IM or IVPB) 6.25 mg (09/10/21 1058)     LOS: 9 days   Time spent: 25 minutes.  Barton Dubois, MD  [facility] 09/10/2021, 6:36 PM

## 2021-09-10 NOTE — Progress Notes (Signed)
Subjective: Patient is doing well today.  Abdominal pain mild.  Tolerating diet without any issues.  Liver function test continue to improve.  Objective: Vital signs in last 24 hours: Temp:  [98.1 F (36.7 C)-98.3 F (36.8 C)] 98.2 F (36.8 C) (09/17 1407) Pulse Rate:  [72-77] 77 (09/17 1407) Resp:  [16-18] 16 (09/17 0617) BP: (122-138)/(61-83) 138/81 (09/17 1407) SpO2:  [92 %-95 %] 95 % (09/17 1407) Last BM Date: 09/09/21 General:   Alert and oriented, pleasant Abdomen:  Bowel sounds present, soft, tender to palpation epigastric region, non-distended. No HSM or hernias noted. No rebound or guarding. No masses appreciated  Msk:  Symmetrical without gross deformities. Normal posture. Extremities:  Without clubbing or edema. Neurologic:  Alert and  oriented x4;  grossly normal neurologically. Skin:  Warm and dry, intact without significant lesions.  Cervical Nodes:  No significant cervical adenopathy. Psych:  Alert and cooperative. Normal mood and affect.  Intake/Output from previous day: 09/16 0701 - 09/17 0700 In: 997.3 [P.O.:720; I.V.:277.3] Out: -  Intake/Output this shift: Total I/O In: 522.3 [P.O.:240; I.V.:282.3] Out: -   Lab Results: Recent Labs    09/08/21 0539 09/09/21 0620 09/10/21 0534  WBC 5.7 7.6 6.4  HGB 12.6 11.8* 11.1*  HCT 38.3 35.9* 34.2*  PLT 178 201 182    BMET Recent Labs    09/08/21 0539 09/09/21 0620 09/10/21 0534  NA 136 135 134*  K 3.3* 3.6 3.4*  CL 98 98 101  CO2 31 27 32  GLUCOSE 113* 116* 114*  BUN '11 20 17  '$ CREATININE 0.99 1.35* 1.08*  CALCIUM 9.0 8.8* 8.3*    LFT Recent Labs    09/08/21 0539 09/09/21 0620 09/10/21 0534  PROT 6.7 6.8 6.7  ALBUMIN 3.1* 3.2* 3.2*  AST 488* 442* 370*  ALT 767* 695* 587*  ALKPHOS 144* 151* 164*  BILITOT 13.0* 10.3* 8.1*    PT/INR Recent Labs    09/08/21 0539 09/09/21 0844  LABPROT 13.0 13.0  INR 1.0 1.0    Hepatitis Panel No results for input(s): HEPBSAG, HCVAB, HEPAIGM,  HEPBIGM in the last 72 hours.    Studies/Results: No results found.  Assessment: *Acute hepatitis B *Abnormal liver function test due to above *Abdominal pain *Gastritis  Plan: Patient presenting with abnormal liver function tests in the setting of acute hepatitis B, surface antigen positive, core IgM positive, Be+, viral load 369k.  Also HSV IgM positive.  Fortunately her synthetic function has remained intact with INR 1.0.  Underwent liver biopsy 09/08/2021.  Discussed with pathologist yesterday evening.  Biopsy consistent with viral hepatitis, possible drug-induced hepatitis.  Pathology believes HSV is less likely though will order further stains to evaluate.  Likely given her clinical picture this is due to acute hepatitis B.  Given her continued improvement in her liver function tests as well as normal coags, we will continue to hold off on treating for potential HSV hepatitis.  If further stains inidcate that this is also contributing we will reevaluate HSV treatment at that time.  Continue daily LFTs and coags.  Analgesia and antiemetics per hospitalist.  GI to continue to follow.  Shelly Larson. Shelly Larson, D.O. Gastroenterology and Hepatology [facility]   LOS: 9 days    09/10/2021, 3:07 PM

## 2021-09-10 NOTE — Progress Notes (Signed)
Patient with some pain and nausea this am. Medicated and restarted IV fluids as ordered.

## 2021-09-10 NOTE — Plan of Care (Signed)
  Problem: Nutrition Goal: Patient maintains adequate hydration Outcome: Progressing Goal: Patient maintains weight Outcome: Progressing Goal: Patient/Family demonstrates understanding of diet Outcome: Progressing   Problem: Education: Goal: Knowledge of General Education information will improve Description: Including pain rating scale, medication(s)/side effects and non-pharmacologic comfort measures Outcome: Progressing

## 2021-09-10 NOTE — Progress Notes (Signed)
Patient found on her side, wincing. Asked her about her pain, she states its staying around a 7 and she's been trying to "just deal with it". Messaged Dr. Dyann Kief.

## 2021-09-11 ENCOUNTER — Telehealth: Payer: Self-pay | Admitting: Internal Medicine

## 2021-09-11 DIAGNOSIS — N179 Acute kidney failure, unspecified: Secondary | ICD-10-CM

## 2021-09-11 DIAGNOSIS — J4520 Mild intermittent asthma, uncomplicated: Secondary | ICD-10-CM | POA: Diagnosis not present

## 2021-09-11 DIAGNOSIS — E119 Type 2 diabetes mellitus without complications: Secondary | ICD-10-CM

## 2021-09-11 DIAGNOSIS — R7989 Other specified abnormal findings of blood chemistry: Secondary | ICD-10-CM | POA: Diagnosis not present

## 2021-09-11 DIAGNOSIS — K769 Liver disease, unspecified: Secondary | ICD-10-CM | POA: Diagnosis not present

## 2021-09-11 DIAGNOSIS — B169 Acute hepatitis B without delta-agent and without hepatic coma: Secondary | ICD-10-CM

## 2021-09-11 DIAGNOSIS — R1011 Right upper quadrant pain: Secondary | ICD-10-CM | POA: Diagnosis not present

## 2021-09-11 LAB — COMPREHENSIVE METABOLIC PANEL
ALT: 523 U/L — ABNORMAL HIGH (ref 0–44)
AST: 309 U/L — ABNORMAL HIGH (ref 15–41)
Albumin: 3.2 g/dL — ABNORMAL LOW (ref 3.5–5.0)
Alkaline Phosphatase: 163 U/L — ABNORMAL HIGH (ref 38–126)
Anion gap: 6 (ref 5–15)
BUN: 11 mg/dL (ref 6–20)
CO2: 27 mmol/L (ref 22–32)
Calcium: 8.7 mg/dL — ABNORMAL LOW (ref 8.9–10.3)
Chloride: 102 mmol/L (ref 98–111)
Creatinine, Ser: 0.91 mg/dL (ref 0.44–1.00)
GFR, Estimated: >60 mL/min (ref >60)
Glucose, Bld: 104 mg/dL — ABNORMAL HIGH (ref 70–99)
Potassium: 3.3 mmol/L — ABNORMAL LOW (ref 3.5–5.1)
Sodium: 135 mmol/L (ref 135–145)
Total Bilirubin: 7.3 mg/dL — ABNORMAL HIGH (ref 0.3–1.2)
Total Protein: 6.6 g/dL (ref 6.5–8.1)

## 2021-09-11 LAB — PROTIME-INR
INR: 1 (ref 0.8–1.2)
Prothrombin Time: 13.5 seconds (ref 11.4–15.2)

## 2021-09-11 MED ORDER — LOSARTAN POTASSIUM-HCTZ 50-12.5 MG PO TABS: 1.0000 | ORAL_TABLET | Freq: Every morning | ORAL | Status: DC

## 2021-09-11 MED ORDER — OXYCODONE HCL 5 MG PO TABS: 5.0000 mg | ORAL_TABLET | Freq: Four times a day (QID) | ORAL | 0 refills | Status: DC | PRN

## 2021-09-11 MED ORDER — PANTOPRAZOLE SODIUM 40 MG PO TBEC: 40.0000 mg | DELAYED_RELEASE_TABLET | Freq: Every day | ORAL | 1 refills | Status: DC

## 2021-09-11 MED ORDER — ONDANSETRON 8 MG PO TBDP: 8.0000 mg | ORAL_TABLET | Freq: Three times a day (TID) | ORAL | 0 refills | Status: DC | PRN

## 2021-09-11 NOTE — Discharge Summary (Signed)
Physician Discharge Summary  Shelly Larson [id number] DOB: 08-03-1971 DOA: 08/31/2021  PCP: [facility]  Admit date: 08/31/2021 Discharge date: 09/11/2021  Time spent: 35 minutes  Recommendations for Outpatient Follow-up:  Repeat complete metabolic panel to follow electrolytes, renal function LFTs Outpatient follow-up with gastroenterology 33 Reassess patient blood pressure and and resume/adjust antihypertensive agents as required.  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  Discharged home with instruction to follow-up with PCP and gastroenterology as an outpatient.  CODE STATUS: Full code.  Diet recommendation: Heart healthy, low calorie and modified carbohydrate diet.  Filed Weights   08/31/21 1405  Weight: 122.5 kg    History of present illness:  As per H&P written by Dr. Josephine Cables on 08/31/2021 Shelly Larson is a 50 y.o. female with medical history significant for hypertension, asthma, type 2 diabetes mellitus who presents to the emergency department after being has to go to the ED due to abnormal liver enzymes.  Patient complained of nasal congestion with sinus pressure about 2 weeks ago and she went to PCP who started her on Sudafed, this was followed with a virtual visit due to minimal relief from symptoms and she was prescribed with Augmentin and prednisone.  6 days ago (9/1), she started to complain of intermittent epigastric pain which was described as sharp/dull pain of moderate intensity, this was associated with nausea which was more consistent.  Abdominal pain worsens at night and  sometimes with food.  She followed up with PCP yesterday (8/6), blood work was done and she was called today due to elevated liver enzymes and was asked to go to the emergency department for further evaluation and management. Patient complained of chronic history of right shoulder pain and that she has been taking Advil and acetaminophen every 4-6 hours for about a month.  She denies use of alcohol and denies painful/difficult swallowing.   ED Course:  In the emergency department, BP was 164/95 and other vital signs were within normal range.  Work-up in the ED shows normal CBC and BMP except for elevated liver enzymes (AST 1036, ALT 1476), total bilirubin 13.0, direct bilirubin 8.6, indirect bilirubin 5.1.  Urinalysis was positive for large bilirubin and glycosuria.  Acetaminophen level < 10.  Influenza A, B, SARS coronavirus 2 was negative.   Right upper quadrant ultrasound showed increased echogenicity of the liver which is most commonly seen with fatty infiltration of the liver.  There are no obvious focal liver lesions. IV hydration was provided, Reglan and Benadryl were given.  Gastroenterologist was consulted, additional labs were recommended to be ordered by the ED physician with plan to see patient in the morning.  Hospitalist was asked to admit patient for further evaluation and management.  Hospital Course:  Acute hepatitis B infection.  ?  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 Also positive for HSV 1. - Viral load 300,000. -GI service recommending continue supportive care and analgesics. -Underwent liver biopsy at [facility] on 09/08/2021 with pathology preliminary results suggesting viral  hepatitis. -Final results still pending and they would be followed by GI service.. -LFTs continue improving, repeat CMET as an outpatient. -no further nausea or vomiting, good control of abd pain with oral analgesics.  -Patient discharge home and will  follow-up with gastroenterology service and PCP after discharge.   AKI -baseline creatinine ~ 0.9, creatinine peaked at 2.6,  -continue minimizing nephrotoxic agents and follow renal function trend. -Continue to maintain adequate hydration -Creatinine down to 0.91 at discharge.   HTN -Stable, continue amlodipine and metoprolol -Continue holding ACE inhibitor until follow up with PCP.  -Continue to follow heart healthy diet.   Asthma -Well compensated at present  -Patient denies shortness of breath or wheezing. -continue home inhaled medications.   DM2 -A1c 6.2  -CBG well controlled .  -On metformin at home. -Continue sliding scale insulin while inpatient.   Hypokalemia/Hypomagnesemia -In the setting of GI losses and decreased oral intake. -Repleted and within normal limits at discharge -Repeat electrolytes and base metabolic panel follow-up visit to assess stability.   Obesity -BMI 36.6 -Low calorie diet, portion control and increase physical activity discussed with patient.  Procedures: See below for x-ray reports Liver biopsy 09/08/21  Consultations: GI service.  Discharge Exam: Vitals:   09/11/21 0806 09/11/21 0941  BP:  138/82  Pulse:  75  Resp:    Temp:    SpO2: 94%    General exam: Alert, awake, oriented x 3; reporting no nausea or vomiting; tolerating diet. Still complaining of mild abdominal pain. Respiratory system: Clear to auscultation. Respiratory effort normal.  No requiring oxygen supplementation.  Good saturation on room air. Cardiovascular system:RRR. No murmurs, rubs, gallops. Gastrointestinal system: Abdomen is obese, nondistended, soft and mildly tender to palpation in the right upper/mid epigastric area.   Central nervous system: Alert and oriented. No focal neurological deficits. Extremities: No cyanosis or clubbing. Skin: No petechiae. Psychiatry: Judgement and insight appear normal. Mood & affect appropriate.    Discharge  Instructions   Discharge Instructions     Diet - low sodium heart healthy   Complete by: As directed    Discharge instructions   Complete by: As directed    Continue to maintain adequate hydration Follow soft/easy to digest heart healthy and modify carbohydrate diet. Continue working on weight loss and management Arrange follow-up with PCP in 10 days Follow-up with gastroenterology as instructed.      Allergies as of 09/11/2021       Reactions   Lisinopril Cough        Medication List     STOP taking these medications    amphetamine-dextroamphetamine 10 MG tablet Commonly known as: ADDERALL   amphetamine-dextroamphetamine 20 MG 24 hr capsule Commonly known as: ADDERALL XR       TAKE these medications    albuterol 108 (90 Base) MCG/ACT inhaler Commonly known as: VENTOLIN HFA Inhale 2 puffs into the lungs every 4 (four) hours as needed for wheezing or shortness of breath.   amLODipine 5 MG tablet Commonly known as: NORVASC TAKE 1 TABLET BY MOUTH EVERY DAY What changed: when to take this   cyclobenzaprine 10 MG tablet Commonly known as: FLEXERIL Take 10 mg by mouth at bedtime as needed for muscle spasms.   fluticasone furoate-vilanterol 100-25 MCG/INH Aepb Commonly known as: BREO ELLIPTA Inhale 1 puff into the lungs every morning.   losartan-hydrochlorothiazide 50-12.5 MG tablet Commonly known as: HYZAAR Take 1 tablet by mouth every morning. Keep holding medication until follow-up with your PCP in 10  days. What changed: additional instructions   metFORMIN 500 MG tablet Commonly known as: GLUCOPHAGE Take 1 tablet (500 mg total) by mouth 2 (two) times daily with a meal.   metoprolol succinate 50 MG 24 hr tablet Commonly known as: TOPROL-XL Take 50 mg by mouth every morning.   nitroGLYCERIN 0.4 MG SL tablet Commonly known as: NITROSTAT Place 0.4 mg under the tongue every 5 (five) minutes as needed.   ondansetron 8 MG disintegrating tablet Commonly  known as: Zofran ODT Take 1 tablet (8 mg total) by mouth every 8 (eight) hours as needed for nausea or vomiting.   OVER THE COUNTER MEDICATION Apply 1 application topically daily as needed (for pain hemmorrhoids). [facility] OTC   oxyCODONE 5 MG immediate release tablet Commonly known as: Oxy IR/ROXICODONE Take 1 tablet (5 mg total) by mouth every 6 (six) hours as needed for severe pain.   pantoprazole 40 MG tablet Commonly known as: PROTONIX Take 1 tablet (40 mg total) by mouth daily. Start taking on: September 12, 2021   Topiramate ER 100 MG Cp24 Commonly known as: TROKENDI XR Take 1 capsule by mouth at bedtime.       Allergies  Allergen Reactions   Lisinopril Cough    Follow-up Information     [facility]. Schedule an appointment as soon as possible for a visit in 10 day(s).   Contact information: [place] [address] [phone]                 The results of significant diagnostics from this hospitalization (including imaging, microbiology, ancillary and laboratory) are listed below for reference.    Significant Diagnostic Studies: CT ABDOMEN PELVIS W CONTRAST  Result Date: 09/06/2021 CLINICAL DATA:  Nonlocalized abdominal pain with nausea and vomiting, recent diagnosis of acute hepatitis B infection EXAM: CT ABDOMEN AND PELVIS WITH CONTRAST TECHNIQUE: Multidetector CT imaging of the abdomen and pelvis was performed using the standard protocol following bolus administration of intravenous contrast. CONTRAST:  80 cc Omnipaque 350 COMPARISON:  Ultrasound August 31, 2021 and CT November 21, 2010 FINDINGS: Lower chest: Scattered bilateral subsegmental atelectasis. Small right pleural effusion with adjacent atelectasis versus consolidation. Hepatobiliary: Periportal edema. No suspicious hepatic lesion. Gallbladder surgically absent. No biliary ductal dilation. Pancreas: Within normal limits. Spleen: Within normal limits. Adrenals/Urinary  Tract: Adrenal glands are unremarkable. Kidneys are normal, without renal calculi, solid enhancing lesion, or hydronephrosis. Bladder is unremarkable for degree of distension. Stomach/Bowel: Radiopaque enteric contrast traverses the hepatic flexure. Small hiatal hernia otherwise the stomach is unremarkable for degree of distension. No pathologic dilation of small or large bowel. The appendix and terminal ileum appear normal. Colonic anastomotic sutures at the splenic flexure. Colonic diverticulosis without findings of acute diverticulitis. Vascular/Lymphatic: No abdominal aortic aneurysm. The portal, splenic and superior mesenteric veins appear patent. No pathologically enlarged abdominal or pelvic lymph nodes. Reproductive: Uterus and left adnexa are unremarkable. Simple appearing 2.2 cm right ovarian cyst. No follow-up imaging recommended. Note: This recommendation does not apply to premenarchal patients and to those with increased risk (genetic, family history, elevated tumor markers or other high-risk factors) of ovarian cancer. Reference: JACR 2020 Feb; 17(2):248-254 Other: Trace perihepatic free fluid. No walled off fluid collections. Likely sequela of subcutaneous injections in the anterior abdominal wall. Trace nonspecific subcutaneous edema. Musculoskeletal: Thoracolumbar spondylosis. No acute osseous abnormality. IMPRESSION: 1. Periportal edema with trace perihepatic free fluid, as can be seen with patient's reported acute hepatitis B infection. 2. Small right pleural effusion  with adjacent atelectasis versus consolidation. 3. Colonic diverticulosis without findings of acute diverticulitis. Electronically Signed   By: Dahlia Bailiff M.D.   On: 09/06/2021 14:53   US RENAL  Result Date: 09/02/2021 CLINICAL DATA:  Acute renal failure Hypertension EXAM: RENAL / URINARY TRACT ULTRASOUND COMPLETE COMPARISON:  CT abdomen pelvis 11/21/2010 FINDINGS: Right Kidney: Renal measurements: 13.6 x 4.9 x 7.2 cm =  volume: 251 mL. Echogenicity within normal limits. No mass or hydronephrosis visualized. Left Kidney: Renal measurements: 13.2 x 7.1 x 6.6 cm = volume: 324 mL. Echogenicity within normal limits. No mass or hydronephrosis visualized. Bladder: Appears normal for degree of bladder distention. Other: None. IMPRESSION: No significant sonographic abnormality of the kidneys. Electronically Signed   By: Miachel Roux M.D.   On: 09/02/2021 13:33   US BIOPSY (LIVER)  Result Date: 09/08/2021 CLINICAL DATA:  Transaminitis, hepatitis B EXAM: ULTRASOUND-GUIDED CORE LIVER BIOPSY TECHNIQUE: An ultrasound guided liver biopsy was thoroughly discussed with the patient and questions were answered. The benefits, risks, alternatives, and complications were also discussed. The patient understands and wishes to proceed with the procedure. A verbal as well as written consent was obtained. Survey ultrasound of the liver was performed and an appropriate skin entry site was determined. Skin site was marked, prepped with Betadine, and draped in usual sterile fashion, and infiltrated locally with 1% lidocaine. Intravenous Fentanyl 123mg and Versed '2mg'$  were administered as conscious sedation during continuous monitoring of the patient's level of consciousness and physiological / cardiorespiratory status by the radiology RN, with a total moderate sedation time of 10 minutes. A 17 gauge trocar needle was advanced under ultrasound guidance into the liver. 3 solid-appearing coaxial 18gauge core samples were then obtained through the guide needle. The guide needle was removed. Post procedure scans demonstrate no apparent complication. COMPLICATIONS: COMPLICATIONS None immediate FINDINGS: Limited ultrasound of the liver shows no focal lesion or biliary ductal dilatation. Representative core biopsy samples obtained from the right lobe as above. IMPRESSION: 1. Technically successful ultrasound guided core liver biopsy. Electronically Signed   By: DLucrezia EuropeM.D.   On: 09/08/2021 15:49   UKoreaAbdomen Limited RUQ (LIVER/GB)  Addendum Date: 09/01/2021   ADDENDUM REPORT: 09/01/2021 12:16 ADDENDUM: Dictation error occurred in the finding section of the report. 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 Electronically Signed   By: JDahlia BailiffM.D.   On: 09/01/2021 12:16   Result Date: 09/01/2021 CLINICAL DATA:  Elevated LFTs EXAM: ULTRASOUND ABDOMEN LIMITED RIGHT UPPER QUADRANT COMPARISON:  CT November 21, 2010. FINDINGS: Gallbladder: No gallstones or wall thickening visualized. No sonographic Murphy sign noted by sonographer. Common bile duct: Diameter: 5 mm Liver: No focal lesion identified. Diffusely increased parenchymal echogenicity with decreased acoustic penetration. Portal vein is patent on color Doppler imaging with normal direction of blood flow towards the liver. Other: None. IMPRESSION: The echogenicity of the liver is increased. This is a nonspecific finding but is most commonly seen with fatty infiltration of the liver. There are no obvious focal liver lesions. Electronically Signed: By: JDahlia BailiffM.D. On: 08/31/2021 18:27    Microbiology: No results found for this or any previous visit (from the past 240 hour(s)).   Labs: Basic Metabolic Panel: Recent Labs  Lab 09/05/21 0642 09/06/21 0646 09/07/21 0612 09/08/21 0539 09/09/21 0620 09/10/21 0534 09/11/21 0517  NA 136 139 136 136 135 134* 135  K 3.6 3.8 3.2* 3.3* 3.6 3.4* 3.3*  CL 101 99 98 98 98 101 102  CO2 '26 30 28 31 27 '$ 32 27  GLUCOSE 103* 112* 104* 113* 116* 114* 104*  BUN '15 10 9 11 20 17 11  '$ CREATININE 1.42* 1.10* 0.89 0.99 1.35* 1.08* 0.91  CALCIUM 8.7* 9.1 8.8* 9.0 8.8* 8.3* 8.7*  MG 1.9 1.5* 1.6* 1.8 1.8  --   --    Liver Function Tests: Recent Labs  Lab 09/07/21 0612 09/08/21 0539 09/09/21 0620 09/10/21 0534 09/11/21 0517  AST 576* 488* 442* 370* 309*  ALT 969* 767* 695* 587* 523*  ALKPHOS 142* 144* 151* 164* 163*  BILITOT  14.5* 13.0* 10.3* 8.1* 7.3*  PROT 6.7 6.7 6.8 6.7 6.6  ALBUMIN 3.1* 3.1* 3.2* 3.2* 3.2*   CBC: Recent Labs  Lab 09/05/21 0642 09/06/21 0646 09/07/21 0612 09/08/21 0539 09/09/21 0620 09/10/21 0534  WBC 5.9 6.4 5.7 5.7 7.6 6.4  NEUTROABS 3.5 3.9 3.5 3.6 6.2  --   HGB 12.7 13.1 12.8 12.6 11.8* 11.1*  HCT 38.8 40.6 39.4 38.3 35.9* 34.2*  MCV 85.1 85.7 83.8 84.7 85.9 87.0  PLT 152 169 183 178 201 182   CBG: Recent Labs  Lab 09/08/21 0746 09/08/21 1136 09/08/21 1612 09/09/21 0746 09/09/21 1134  GLUCAP 108* 98 112* 92 141*    Signed:  Barton Dubois MD.  [facility] 09/11/2021, 12:12 PM

## 2021-09-11 NOTE — Progress Notes (Signed)
Discharge instructions given to pt. Understood to call primary for follow up. Gertie Fey will follow up with her. Scripts sent to pharmacy, Transported by wheelchair to entrance. Family transported home.

## 2021-09-11 NOTE — Telephone Encounter (Signed)
This patient is being discharged from the hospital today.  She has been admitted to [facility] for acute hepatitis B.  I have ordered repeat CMP and INR to be completed this week at [facility], patient understands and states she will go to have this done.  We will need to follow-up on her liver biopsy results.  I did discuss with pathologist on Friday who believes pathology consistent with viral hepatitis, less likely drug-induced hepatitis.  Felt HSV induced hepatitis less likely  though we will order further stains to evaluate.  We will need to follow-up on these as well.  Can you arrange follow-up visit with Dr. Gala Romney or one of the apps in 3 to 4 weeks?  Please add to cancellation list if not available.  Thank you  Forwarding to Dr. Gala Romney as an Juluis Rainier.

## 2021-09-11 NOTE — Progress Notes (Signed)
Subjective: Patient is doing well today.  Abdominal pain mild.  Tolerating diet without any issues.  Liver function test continue to improve.  Excited about potentially being discharged today.  Objective: Vital signs in last 24 hours: Temp:  [98.2 F (36.8 C)] 98.2 F (36.8 C) (09/18 0505) Pulse Rate:  [64-78] 75 (09/18 0941) Resp:  [18] 18 (09/18 0505) BP: (129-174)/(70-82) 138/82 (09/18 0941) SpO2:  [94 %-96 %] 94 % (09/18 0806) Last BM Date: 09/11/21 General:   Alert and oriented, pleasant Abdomen:  Bowel sounds present, soft, tender to palpation epigastric region, non-distended. No HSM or hernias noted. No rebound or guarding. No masses appreciated  Msk:  Symmetrical without gross deformities. Normal posture. Extremities:  Without clubbing or edema. Neurologic:  Alert and  oriented x4;  grossly normal neurologically. Skin:  Warm and dry, intact without significant lesions.  Cervical Nodes:  No significant cervical adenopathy. Psych:  Alert and cooperative. Normal mood and affect.  Intake/Output from previous day: 09/17 0701 - 09/18 0700 In: 762.3 [P.O.:480; I.V.:282.3] Out: -  Intake/Output this shift: Total I/O In: 240 [P.O.:240] Out: -   Lab Results: Recent Labs    09/09/21 0620 09/10/21 0534  WBC 7.6 6.4  HGB 11.8* 11.1*  HCT 35.9* 34.2*  PLT 201 182    BMET Recent Labs    09/09/21 0620 09/10/21 0534 09/11/21 0517  NA 135 134* 135  K 3.6 3.4* 3.3*  CL 98 101 102  CO2 27 32 27  GLUCOSE 116* 114* 104*  BUN '20 17 11  '$ CREATININE 1.35* 1.08* 0.91  CALCIUM 8.8* 8.3* 8.7*    LFT Recent Labs    09/09/21 0620 09/10/21 0534 09/11/21 0517  PROT 6.8 6.7 6.6  ALBUMIN 3.2* 3.2* 3.2*  AST 442* 370* 309*  ALT 695* 587* 523*  ALKPHOS 151* 164* 163*  BILITOT 10.3* 8.1* 7.3*    PT/INR Recent Labs    09/09/21 0844 09/11/21 1016  LABPROT 13.0 13.5  INR 1.0 1.0    Hepatitis Panel No results for input(s): HEPBSAG, HCVAB, HEPAIGM, HEPBIGM in the last  72 hours.    Studies/Results: No results found.  Assessment: *Acute hepatitis B *Abnormal liver function test due to above *Abdominal pain *Gastritis  Plan: Patient presenting with abnormal liver function tests in the setting of acute hepatitis B, surface antigen positive, core IgM positive, Be+, viral load 369k.  Also HSV IgM positive.  Fortunately her synthetic function has remained intact with INR 1.0.  Underwent liver biopsy 09/08/2021.  Discussed with pathologist 09/09/21.  Biopsy consistent with viral hepatitis, possible drug-induced hepatitis.  Pathology believes HSV is less likely though will order further stains to evaluate.  Likely given her clinical picture this is due to acute hepatitis B.  Given her continued improvement in her liver function tests as well as normal coags, we will continue to hold off on treating for potential HSV hepatitis.  If further stains inidcate that this is also contributing we will reevaluate HSV treatment at that time.  Patient being discharged today.  I will order repeat CMP and INR this week to be performed at [facility] labs.  Patient states she will go to have this done.  I will also arrange close follow-up in the GI clinic.  We will need to follow-up on her biopsy results once new stains have been performed.  Elon Alas. Abbey Chatters, D.O. Gastroenterology and Hepatology [facility]   LOS: 10 days    09/11/2021, 11:32 AM

## 2021-09-12 LAB — SURGICAL PATHOLOGY

## 2021-09-13 ENCOUNTER — Other Ambulatory Visit: Payer: Self-pay

## 2021-09-13 ENCOUNTER — Telehealth: Payer: Self-pay

## 2021-09-13 DIAGNOSIS — B169 Acute hepatitis B without delta-agent and without hepatic coma: Secondary | ICD-10-CM

## 2021-09-13 NOTE — Telephone Encounter (Signed)
Phoned and LMOVM advising the pt that her lab form for INR/PT and CMP are in the system

## 2021-09-15 LAB — MISC LABCORP TEST (SEND OUT)
Labcorp test code: 9985
Labcorp test code: 9985

## 2021-09-15 LAB — PROTIME-INR
INR: 1 (ref 0.9–1.2)
Prothrombin Time: 10.4 s (ref 9.1–12.0)

## 2021-09-15 NOTE — Telephone Encounter (Signed)
Pt is aware of OV with RMR on 10/25 at 330pm

## 2021-10-18 ENCOUNTER — Other Ambulatory Visit: Payer: Self-pay

## 2021-10-18 ENCOUNTER — Encounter: Payer: Self-pay | Admitting: Internal Medicine

## 2021-10-18 ENCOUNTER — Ambulatory Visit (INDEPENDENT_AMBULATORY_CARE_PROVIDER_SITE_OTHER): Payer: 59 | Admitting: Internal Medicine

## 2021-10-18 VITALS — BP 204/113 | HR 73 | Temp 97.0°F | Ht 70.0 in | Wt 260.0 lb

## 2021-10-18 DIAGNOSIS — K219 Gastro-esophageal reflux disease without esophagitis: Secondary | ICD-10-CM

## 2021-10-18 DIAGNOSIS — C189 Malignant neoplasm of colon, unspecified: Secondary | ICD-10-CM | POA: Diagnosis not present

## 2021-10-18 DIAGNOSIS — B169 Acute hepatitis B without delta-agent and without hepatic coma: Secondary | ICD-10-CM

## 2021-10-18 DIAGNOSIS — K625 Hemorrhage of anus and rectum: Secondary | ICD-10-CM

## 2021-10-18 NOTE — Progress Notes (Signed)
Primary Care Physician:  [facility] Gastroenterologist:  Dr. Gala Romney  Pre-Procedure History & Physical: HPI:  Shelly Larson is a 50 y.o. female here for hospital follow-up.  Admitted to [facility] a month ago with epigastric right upper quadrant abdominal pain markedly elevated LFTs.  Serological evaluation indicated acute hepatitis B based on positive hepatitis B surface antigen, hepatitis B IgM positive serologies.  B antigen positive. Liver biopsy demonstrated findings consistent with acute viral hepatitis.  EGD done during her hospitalization revealing non-H. pylori gastritis only.  No evidence of autoimmune.  Specific stains for HSV negative although she had a mildly positive HSV IgM titer.  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 Patient likely contracted acute hepatitis B sexually.  History of right hemicolectomy of colon cancer in the distant past.  Last colonoscopy done by me 2019 demonstrated a small adenomas was removed; she is due for surveillance colonoscopy in 2 years.  Blood pressure is up today.  She has not taken her blood pressure medication due to helping a friend who is at the hospital.  Patient complains of chronic constipation - may go 2 to 3 days without a bowel movement.  Until she uses an enema.  Past Medical History:  Diagnosis Date   ADHD    Anemia    Arthritis    Biliary acute pancreatitis 2010   Colon cancer ([place]) 2016   Transverse colon    Diabetes mellitus without complication ([place])    Dyspnea    Hypertension    Migraine     Past Surgical History:  Procedure Laterality Date   BAND HEMORRHOIDECTOMY     BIOPSY  09/02/2021   Procedure: BIOPSY;  Surgeon: Eloise Harman, DO;  [facility];  Service: Endoscopy;;   CHOLECYSTECTOMY  2010   [place], biliary pancreatitis   COLECTOMY  09/30/2015   distal transverse d/t adenocarcinoma   COLONOSCOPY N/A 06/11/2018   Procedure: COLONOSCOPY;  Surgeon: Daneil Dolin, MD;  [facility];  Service: Endoscopy;  Laterality: N/A;  1:45pm   DILITATION & CURRETTAGE/HYSTROSCOPY WITH NOVASURE ABLATION N/A 09/10/2018   Procedure: DILATATION & CURETTAGE/HYSTEROSCOPY WITH MINERVA  ENDOMETRIAL ABLATION;  Surgeon: Jonnie Kind, MD;  Location: [facility];  Service: Gynecology;  Laterality: N/A;   ESOPHAGOGASTRODUODENOSCOPY (EGD) WITH PROPOFOL N/A 09/02/2021   Procedure: ESOPHAGOGASTRODUODENOSCOPY (EGD) WITH PROPOFOL;  Surgeon: Eloise Harman, DO;  [facility];  Service: Endoscopy;  Laterality: N/A;   POLYPECTOMY  06/11/2018   Procedure: POLYPECTOMY;  Surgeon: Daneil Dolin, MD;  [facility];  Service: Endoscopy;;  colon    Prior to Admission medications   Medication Sig Start Date End Date Taking? Authorizing Provider  albuterol (VENTOLIN HFA) 108 (90 Base) MCG/ACT inhaler Inhale 2 puffs into the lungs every 4 (four) hours as needed for wheezing or shortness of breath.  10/17/17  Yes [provider]  amLODipine (NORVASC) 5 MG tablet TAKE 1 TABLET BY MOUTH EVERY DAY Patient taking differently: Take 5 mg by mouth every morning. 05/10/18  Yes Herminio Commons, MD  cyclobenzaprine (FLEXERIL) 10 MG tablet Take 10 mg by mouth at bedtime as needed for muscle spasms.   Yes [provider]  fluticasone furoate-vilanterol (BREO ELLIPTA) 100-25 MCG/INH AEPB Inhale 1 puff into the lungs every morning.    Yes [provider]  losartan-hydrochlorothiazide (HYZAAR) 50-12.5 MG tablet Take 1 tablet by mouth every morning. Keep holding medication until follow-up  with your PCP in 10 days. 09/11/21  Yes Barton Dubois, MD  metFORMIN (GLUCOPHAGE) 500 MG tablet Take 1 tablet (500 mg total) by mouth 2 (two) times daily with a meal. 11/14/18  Yes Nida, Marella Chimes, MD  metoprolol succinate (TOPROL-XL) 50 MG 24 hr tablet Take 50 mg by mouth every morning.  09/19/17  Yes [provider]  nitroGLYCERIN (NITROSTAT)  0.4 MG SL tablet Place 0.4 mg under the tongue every 5 (five) minutes as needed.  08/21/17  Yes [provider]  ondansetron (ZOFRAN ODT) 8 MG disintegrating tablet Take 1 tablet (8 mg total) by mouth every 8 (eight) hours as needed for nausea or vomiting. 09/11/21  Yes Barton Dubois, MD  OVER THE COUNTER MEDICATION Apply 1 application topically daily as needed (for pain hemmorrhoids). rectacare OTC    Yes [provider]  oxyCODONE (OXY IR/ROXICODONE) 5 MG immediate release tablet Take 1 tablet (5 mg total) by mouth every 6 (six) hours as needed for severe pain. 09/11/21  Yes Barton Dubois, MD  pantoprazole (PROTONIX) 40 MG tablet Take 1 tablet (40 mg total) by mouth daily. 09/12/21  Yes Barton Dubois, MD  Topiramate ER (TROKENDI XR) 100 MG CP24 Take 1 capsule by mouth at bedtime.   Yes [provider]    Allergies as of 10/18/2021 - Review Complete 10/18/2021  Allergen Reaction Noted   Lisinopril Cough 05/08/2018    Family History  Problem Relation Age of Onset   Hypertension Mother    Diabetes Mellitus II Mother    Heart disease Sister        dx in late 22's   Breast cancer Paternal Grandmother    Breast cancer Paternal Aunt    Colon cancer Neg Hx    Liver disease Neg Hx     Social History   Socioeconomic History   Marital status: Single    Spouse name: Not on file   Number of children: Not on file   Years of education: Not on file   Highest education level: Not on file  Occupational History   Occupation: Public affairs consultant  Tobacco Use   Smoking status: Never   Smokeless tobacco: Never  Vaping Use   Vaping Use: Never used  Substance and Sexual Activity   Alcohol use: Yes    Comment: rare   Drug use: No   Sexual activity: Yes    Birth control/protection: None, Surgical    Comment: ablation  Other Topics Concern   Not on file  Social History Narrative   Not on file   Social Determinants of Health   Financial Resource Strain: Not on file   Food Insecurity: Not on file  Transportation Needs: Not on file  Physical Activity: Not on file  Stress: Not on file  Social Connections: Not on file  Intimate Partner Violence: Not on file    Review of Systems: See HPI, otherwise negative ROS  Physical Exam: BP (!) 204/113   Pulse 73   Temp (!) 97 F (36.1 C)   Ht 5\' 10"  (1.778 m)   Wt 260 lb (117.9 kg)   BMI 37.31 kg/m  General:   Alert,  Well-developed, well-nourished, pleasant and cooperative in NAD Skin:  Intact without significant lesions or rashes. Eyes:  Sclera clear, no icterus.   Conjunctiva pink. Abdomen: Non-distended, normal bowel sounds.  Soft and nontender without appreciable mass or hepatosplenomegaly.  Pulses:  Normal pulses noted. Extremities:  Without clubbing or edema.  Impression/Plan: 50 year old lady admitted to  the hospital recently with acute hepatitis B.  Clinically, she is recovering nicely.  Liver biopsy consistent.  I do not think an HSV infection has anything to do with her recent hepatitis.  GERD symptoms suboptimally controlled particularly at night with every morning PPI.  She is significantly overweight we discussed the multipronged approach to the treatment of GERD.  History of colon cancer undergoing right hemicolectomy in [place] previously.  Adenoma removed from her colon in 2019; due for surveillance 2024.  Recommendations  GERD and constipation information provided.  Repeat hepatic function profile today  We will do specific serological blood work in 8 weeks to document complete recovery from acute hepatitis B  Continue Protonix 40 mg daily-would take it before lunch instead of breakfast to help you with your nighttime reflux symptoms  As discussed, 10-15 pound weight loss in the next 12 months will also help with  reflux symptoms.  Begin Linzess 145 gelcap daily for constipation (samples x2 weeks provided)  Call in 2 weeks and let me know how this is working and we will go from  there.  Colonoscopy 2024.  Office visit with [place] in 3 months    Notice: This dictation was prepared with Dragon dictation along with smaller phrase technology. Any transcriptional errors that result from this process are unintentional and may not be corrected upon review.

## 2021-10-18 NOTE — Patient Instructions (Signed)
Good to see you again today!  GERD and constipation information provided.  Repeat hepatic function profile today  We will do specific serological blood work in 8 weeks to document complete recovery from acute hepatitis B  Continue Protonix 40 mg daily-would take it before lunch instead of breakfast to help you with your nighttime reflux symptoms  As discussed, if you could lose 10-15 more pounds in the next 12 months this will also help with your reflux symptoms.  Begin Linzess 145 gelcap daily for constipation (samples x2 weeks provided)  Call in 2 weeks and let me know how this is working and we will go from there.  Office visit with [place] in 3 months

## 2021-10-22 LAB — HEPATIC FUNCTION PANEL
AG Ratio: 1.4 (calc) (ref 1.0–2.5)
ALT: 14 U/L (ref 6–29)
AST: 14 U/L (ref 10–35)
Albumin: 3.9 g/dL (ref 3.6–5.1)
Alkaline phosphatase (APISO): 54 U/L (ref 31–125)
Bilirubin, Direct: 0.3 mg/dL — ABNORMAL HIGH (ref 0.0–0.2)
Globulin: 2.7 g/dL (calc) (ref 1.9–3.7)
Indirect Bilirubin: 0.7 mg/dL (calc) (ref 0.2–1.2)
Total Bilirubin: 1 mg/dL (ref 0.2–1.2)
Total Protein: 6.6 g/dL (ref 6.1–8.1)

## 2021-10-24 ENCOUNTER — Other Ambulatory Visit: Payer: Self-pay | Admitting: Internal Medicine

## 2021-10-24 DIAGNOSIS — B169 Acute hepatitis B without delta-agent and without hepatic coma: Secondary | ICD-10-CM

## 2021-10-28 ENCOUNTER — Other Ambulatory Visit: Payer: Self-pay

## 2022-01-24 ENCOUNTER — Encounter: Payer: Self-pay | Admitting: Internal Medicine

## 2022-08-17 ENCOUNTER — Encounter: Payer: Self-pay | Admitting: Physician Assistant

## 2022-08-17 ENCOUNTER — Ambulatory Visit: Payer: Managed Care, Other (non HMO) | Admitting: Physician Assistant

## 2022-08-17 VITALS — BP 134/96 | HR 58 | Temp 98.8°F | Ht 70.0 in | Wt 277.8 lb

## 2022-08-17 DIAGNOSIS — F419 Anxiety disorder, unspecified: Secondary | ICD-10-CM

## 2022-08-17 DIAGNOSIS — G479 Sleep disorder, unspecified: Secondary | ICD-10-CM

## 2022-08-17 DIAGNOSIS — Z13220 Encounter for screening for lipoid disorders: Secondary | ICD-10-CM

## 2022-08-17 DIAGNOSIS — R7989 Other specified abnormal findings of blood chemistry: Secondary | ICD-10-CM

## 2022-08-17 DIAGNOSIS — Z23 Encounter for immunization: Secondary | ICD-10-CM

## 2022-08-17 DIAGNOSIS — Z131 Encounter for screening for diabetes mellitus: Secondary | ICD-10-CM

## 2022-08-17 DIAGNOSIS — D649 Anemia, unspecified: Secondary | ICD-10-CM | POA: Insufficient documentation

## 2022-08-17 DIAGNOSIS — I10 Essential (primary) hypertension: Secondary | ICD-10-CM | POA: Diagnosis not present

## 2022-08-17 DIAGNOSIS — D508 Other iron deficiency anemias: Secondary | ICD-10-CM

## 2022-08-17 DIAGNOSIS — G43001 Migraine without aura, not intractable, with status migrainosus: Secondary | ICD-10-CM

## 2022-08-17 MED ORDER — LOSARTAN POTASSIUM-HCTZ 100-25 MG PO TABS: 1.0000 | ORAL_TABLET | Freq: Every day | ORAL | 0 refills | Status: DC

## 2022-08-17 MED ORDER — RIZATRIPTAN BENZOATE 10 MG PO TABS: 10.0000 mg | ORAL_TABLET | ORAL | 0 refills | Status: DC | PRN

## 2022-08-17 NOTE — Patient Instructions (Addendum)
Welcome to [facility]! It was a pleasure meeting you today.  Labs today  Increase Hyzaar - new med sent to pharmacy, monitor at home  Referral to Dr. Tomi Likens for migraines. Trial Rizatriptan for acute migraines.  As discussed, Please schedule a 1 month follow up visit today.  PLEASE NOTE:  If you had any LAB tests please let us know if you have not heard back within a few days. You may see your results on MyChart before we have a chance to review them but we will give you a call once they are reviewed by [place]. If we ordered any REFERRALS today, please let us know if you have not heard from their office within the next two weeks. Let us know through MyChart if you are needing REFILLS, or have your pharmacy send [place] the request. You can also use MyChart to communicate with me or any office staff.  Please try these tips to maintain a healthy lifestyle:  Eat most of your calories during the day when you are active. Eliminate processed foods including packaged sweets (pies, cakes, cookies), reduce intake of potatoes, white bread, white pasta, and white rice. Look for whole grain options, oat flour or almond flour.  Each meal should contain half fruits/vegetables, one quarter protein, and one quarter carbs (no bigger than a computer mouse).  Cut down on sweet beverages. This includes juice, soda, and sweet tea. Also watch fruit intake, though this is a healthier sweet option, it still contains natural sugar! Limit to 3 servings daily.  Drink at least 1 glass of water with each meal and aim for at least 8 glasses (64 ounces) per day.  Exercise at least 150 minutes every week to the best of your ability.    Take Care,  Joey Hudock, PA-C

## 2022-08-17 NOTE — Progress Notes (Signed)
Subjective:    Patient ID: Shelly Larson, female    DOB: 04-26-1971, 51 y.o.   MRN: [id number]  Chief Complaint  Patient presents with   Annual Exam    Pt states migraine returned and doesn't think bp medicine is no longer working for her. Also believes panic/anxiety has returned.    HPI Patient is in today for re-establishment of care with me from [place].  Current specialists:  -Dr. Gala Romney - GI - Appt Sept 9th -[facility] - appt Sept 11th coming up  -Dr. Dulcy Fanny - ortho - knee pain and plantar fasciitis   Concerns:  -Migraines - 2-3x / month now, has to leave work, "debilitating"; nausea and vomiting; triptans don't help now -Anxiety - hasn't been taking anything recently for this -Not sleeping well, started having hot flashes and night sweats; irritated, hair changes; thinks she might be heading into menopause  -HTN - Amlodipine, Losartan-HCTZ, Toprol XL - readings have been up and down; mostly elevated recently  -Elevated liver enzymes - Hepatitis B ended up positive - treated in the hospital - has followed with Dr. Gala Romney and Dr. Pleas Koch   Past Medical History:  Diagnosis Date   ADHD    Anemia    Arthritis    Biliary acute pancreatitis 2010   Colon cancer ([place]) 2016   Transverse colon    Diabetes mellitus without complication ([place])    Dyspnea    Hypertension    Migraine     Past Surgical History:  Procedure Laterality Date   BAND HEMORRHOIDECTOMY     BIOPSY  09/02/2021   Procedure: BIOPSY;  Surgeon: Eloise Harman, DO;  [facility];  Service: Endoscopy;;   CHOLECYSTECTOMY  2010   [place], biliary pancreatitis   COLECTOMY  09/30/2015   distal transverse d/t adenocarcinoma   COLONOSCOPY N/A 06/11/2018   Procedure: COLONOSCOPY;  Surgeon: Daneil Dolin, MD;  [facility];  Service: Endoscopy;  Laterality: N/A;  1:45pm   DILITATION & CURRETTAGE/HYSTROSCOPY WITH NOVASURE ABLATION N/A 09/10/2018   Procedure: DILATATION  & CURETTAGE/HYSTEROSCOPY WITH MINERVA  ENDOMETRIAL ABLATION;  Surgeon: Jonnie Kind, MD;  Location: [facility];  Service: Gynecology;  Laterality: N/A;   ESOPHAGOGASTRODUODENOSCOPY (EGD) WITH PROPOFOL N/A 09/02/2021   Procedure: ESOPHAGOGASTRODUODENOSCOPY (EGD) WITH PROPOFOL;  Surgeon: Eloise Harman, DO;  [facility];  Service: Endoscopy;  Laterality: N/A;   POLYPECTOMY  06/11/2018   Procedure: POLYPECTOMY;  Surgeon: Daneil Dolin, MD;  [facility];  Service: Endoscopy;;  colon    Family History  Problem Relation Age of Onset   Hypertension Mother    Diabetes Mellitus II Mother    Heart disease Sister        dx in late 85's   Breast cancer Paternal Grandmother    Breast cancer Paternal Aunt    Colon cancer Neg Hx    Liver disease Neg Hx     Social History   Tobacco Use   Smoking status: Never   Smokeless tobacco: Never  Vaping Use   Vaping Use: Never used  Substance Use Topics   Alcohol use: Yes    Comment: rare   Drug use: No     Allergies  Allergen Reactions   Lisinopril Cough    Review of Systems NEGATIVE UNLESS OTHERWISE INDICATED IN HPI      Objective:     BP (!) 134/96   Pulse (!) 58   Temp 98.8 F (37.1  C)   Ht 5' 10"  (1.778 m)   Wt 277 lb 12.8 oz (126 kg)   LMP 06/27/2022 (Exact Date)   SpO2 97%   BMI 39.86 kg/m   Wt Readings from Last 3 Encounters:  08/17/22 277 lb 12.8 oz (126 kg)  10/18/21 260 lb (117.9 kg)  08/31/21 270 lb (122.5 kg)    BP Readings from Last 3 Encounters:  08/17/22 (!) 134/96  10/18/21 (!) 204/113  09/11/21 138/82     Physical Exam Vitals and nursing note reviewed.  Constitutional:      Appearance: Normal appearance. She is obese. She is not toxic-appearing.  HENT:     Head: Normocephalic and atraumatic.     Right Ear: Tympanic membrane, ear canal and external ear normal.     Left Ear: Tympanic membrane, ear canal and external ear normal.     Nose: Nose normal.     Mouth/Throat:      Mouth: Mucous membranes are moist.  Eyes:     Extraocular Movements: Extraocular movements intact.     Conjunctiva/sclera: Conjunctivae normal.     Pupils: Pupils are equal, round, and reactive to light.  Cardiovascular:     Rate and Rhythm: Normal rate and regular rhythm.     Pulses: Normal pulses.     Heart sounds: Normal heart sounds.  Pulmonary:     Effort: Pulmonary effort is normal.     Breath sounds: Normal breath sounds.  Abdominal:     General: Abdomen is flat. Bowel sounds are normal.     Palpations: Abdomen is soft.  Musculoskeletal:        General: Normal range of motion.     Cervical back: Normal range of motion and neck supple.  Skin:    General: Skin is warm and dry.  Neurological:     General: No focal deficit present.     Mental Status: She is alert and oriented to person, place, and time.  Psychiatric:        Mood and Affect: Mood normal.        Behavior: Behavior normal.        Thought Content: Thought content normal.        Judgment: Judgment normal.        Assessment & Plan:   Problem List Items Addressed This Visit       Cardiovascular and Mediastinum   Essential hypertension    Elevated today and pressure seem to be reading for patient.  Plan to continue on amlodipine 5 mg daily.  Increase losartan hydrochlorothiazide to 100-25 mg once daily.  She will continue on Toprol XL 50 mg every morning.  I encouraged her to continue to monitor at home.  Keep working on lifestyle changes.      Relevant Medications   losartan-hydrochlorothiazide (HYZAAR) 100-25 MG tablet   Other Relevant Orders   CBC with Differential/Platelet (Completed)   Lipid panel (Completed)   Hemoglobin A1c (Completed)   Comp Met (CMET) (Completed)   Migraine without aura and with status migrainosus, not intractable - Primary    Migraines are worse.  She is currently taking Toprol-XL 50 mg daily.  Plan to try rizatriptan 10 mg as directed for acute onset of migraine.  Also plan for  referral to Dr. Tomi Likens.      Relevant Medications   meloxicam (MOBIC) 7.5 MG tablet   losartan-hydrochlorothiazide (HYZAAR) 100-25 MG tablet   rizatriptan (MAXALT) 10 MG tablet   Other Relevant Orders   CBC with  Differential/Platelet (Completed)   Lipid panel (Completed)   TSH (Completed)   Hemoglobin A1c (Completed)   Comp Met (CMET) (Completed)   Ambulatory referral to Neurology     Other   LFT elevation    Plan to recheck CMP in office today.  She has follow-up with GI next month.      Relevant Orders   Comp Met (CMET) (Completed)   Difficulty sleeping    Plan to check labs with patient and treat pending results.  Sleep hygiene discussed with patient.  She may also be experiencing some hormonal changes that could be contributing to her sleep.      Relevant Orders   CBC with Differential/Platelet (Completed)   TSH (Completed)   Hemoglobin A1c (Completed)   Comp Met (CMET) (Completed)   Ambulatory referral to Neurology   Anxiety    Anxiety has been worse.  Plan discussed this in more detail at next visit.  She may want to look into some counseling.      Absolute anemia    Plan to recheck CBC and iron panel today.  Treat pending results.      Relevant Orders   CBC with Differential/Platelet (Completed)   IBC + Ferritin (Completed)   Other Visit Diagnoses     Diabetes mellitus screening       Relevant Orders   Hemoglobin A1c (Completed)   Comp Met (CMET) (Completed)   Flu vaccine need       Relevant Orders   Flu Vaccine QUAD 6+ mos PF IM (Fluarix Quad PF) (Completed)   Screening for cholesterol level       Relevant Orders   Lipid panel (Completed)        Meds ordered this encounter  Medications   losartan-hydrochlorothiazide (HYZAAR) 100-25 MG tablet    Sig: Take 1 tablet by mouth daily.    Dispense:  90 tablet    Refill:  0    Order Specific Question:   Supervising Provider    Answer:   Marin Olp [[id number]]   rizatriptan (MAXALT) 10 MG tablet     Sig: Take 1 tablet (10 mg total) by mouth as needed for migraine. May repeat in 2 hours if needed. Do not exceed 20 mg in 24 hours.    Dispense:  10 tablet    Refill:  0    Order Specific Question:   Supervising Provider    Answer:   Marin Olp [[id number]]     Return in about 4 weeks (around 09/14/2022) for recheck.  This note was prepared with assistance of Systems analyst. Occasional wrong-word or sound-a-like substitutions may have occurred due to the inherent limitations of voice recognition software.   Kelsay Haggard M Nevaeh Korte, PA-C

## 2022-08-18 LAB — CBC WITH DIFFERENTIAL/PLATELET
Basophils Absolute: 0.1 10*3/uL (ref 0.0–0.1)
Basophils Relative: 1.2 % (ref 0.0–3.0)
Eosinophils Absolute: 0.1 10*3/uL (ref 0.0–0.7)
Eosinophils Relative: 0.7 % (ref 0.0–5.0)
HCT: 42.9 % (ref 36.0–46.0)
Hemoglobin: 14 g/dL (ref 12.0–15.0)
Lymphocytes Relative: 25.9 % (ref 12.0–46.0)
Lymphs Abs: 2.4 10*3/uL (ref 0.7–4.0)
MCHC: 32.6 g/dL (ref 30.0–36.0)
MCV: 86.6 fl (ref 78.0–100.0)
Monocytes Absolute: 0.7 10*3/uL (ref 0.1–1.0)
Monocytes Relative: 7.5 % (ref 3.0–12.0)
Neutro Abs: 6 10*3/uL (ref 1.4–7.7)
Neutrophils Relative %: 64.7 % (ref 43.0–77.0)
Platelets: 160 10*3/uL (ref 150.0–400.0)
RBC: 4.95 Mil/uL (ref 3.87–5.11)
RDW: 14.5 % (ref 11.5–15.5)
WBC: 9.3 10*3/uL (ref 4.0–10.5)

## 2022-08-18 LAB — COMPREHENSIVE METABOLIC PANEL
ALT: 12 U/L (ref 0–35)
AST: 11 U/L (ref 0–37)
Albumin: 4 g/dL (ref 3.5–5.2)
Alkaline Phosphatase: 46 U/L (ref 39–117)
BUN: 13 mg/dL (ref 6–23)
CO2: 28 mEq/L (ref 19–32)
Calcium: 8.9 mg/dL (ref 8.4–10.5)
Chloride: 103 mEq/L (ref 96–112)
Creatinine, Ser: 0.87 mg/dL (ref 0.40–1.20)
GFR: 78.59 mL/min (ref >60.00)
Glucose, Bld: 88 mg/dL (ref 70–99)
Potassium: 3.4 mEq/L — ABNORMAL LOW (ref 3.5–5.1)
Sodium: 137 mEq/L (ref 135–145)
Total Bilirubin: 0.4 mg/dL (ref 0.2–1.2)
Total Protein: 7.2 g/dL (ref 6.0–8.3)

## 2022-08-18 LAB — IBC + FERRITIN
Ferritin: 39.7 ng/mL (ref 10.0–291.0)
Iron: 81 ug/dL (ref 42–145)
Saturation Ratios: 22.3 % (ref 20.0–50.0)
TIBC: 364 ug/dL (ref 250.0–450.0)
Transferrin: 260 mg/dL (ref 212.0–360.0)

## 2022-08-18 LAB — TSH: TSH: 1.42 u[IU]/mL (ref 0.35–5.50)

## 2022-08-18 LAB — LIPID PANEL
Cholesterol: 154 mg/dL (ref 0–200)
HDL: 48 mg/dL (ref >39.00)
LDL Cholesterol: 82 mg/dL (ref 0–99)
NonHDL: 106.26
Total CHOL/HDL Ratio: 3
Triglycerides: 120 mg/dL (ref 0.0–149.0)
VLDL: 24 mg/dL (ref 0.0–40.0)

## 2022-08-18 LAB — HEMOGLOBIN A1C: Hgb A1c MFr Bld: 6.3 % (ref 4.6–6.5)

## 2022-08-21 NOTE — Assessment & Plan Note (Signed)
Anxiety has been worse.  Plan discussed this in more detail at next visit.  She may want to look into some counseling.

## 2022-08-21 NOTE — Assessment & Plan Note (Signed)
Elevated today and pressure seem to be reading for patient.  Plan to continue on amlodipine 5 mg daily.  Increase losartan hydrochlorothiazide to 100-25 mg once daily.  She will continue on Toprol XL 50 mg every morning.  I encouraged her to continue to monitor at home.  Keep working on lifestyle changes.

## 2022-08-21 NOTE — Assessment & Plan Note (Signed)
Plan to recheck CBC and iron panel today.  Treat pending results.

## 2022-08-21 NOTE — Assessment & Plan Note (Signed)
Plan to check labs with patient and treat pending results.  Sleep hygiene discussed with patient.  She may also be experiencing some hormonal changes that could be contributing to her sleep.

## 2022-08-21 NOTE — Assessment & Plan Note (Signed)
Plan to recheck CMP in office today.  She has follow-up with GI next month.

## 2022-08-21 NOTE — Assessment & Plan Note (Signed)
Migraines are worse.  She is currently taking Toprol-XL 50 mg daily.  Plan to try rizatriptan 10 mg as directed for acute onset of migraine.  Also plan for referral to Dr. Tomi Likens.

## 2022-08-24 ENCOUNTER — Encounter: Payer: Self-pay | Admitting: Neurology

## 2022-09-03 NOTE — Progress Notes (Unsigned)
GI Office Note    Referring Provider: [facility], [facility] Primary Care Physician:  Allwardt, Randa Evens, PA-C Primary Gastroenterologist: Dr. Gala Romney  Date:  09/05/2022  ID:  Shelly Larson, DOB 01-20-1971, MRN [id number]   Chief Complaint   Chief Complaint  Patient presents with   Abdominal Pain    Stomach cramps, constipations. Hemorrhoids are bleeding from time to time.     History of Present Illness  Shelly Larson is a 51 y.o. female with a history of anemia, ADHD, biliary pancreatitis, diabetes, HTN, migraines, and colon cancer s/p colectomy in September 2016 presenting today for follow-up.  Last colonoscopy 06/11/2018 -grade 3 hemorrhoids, single 5 mm tubular adenoma in sigmoid colon, external and internal hemorrhoids.  She was given information on hemorrhoid banding as she was symptomatic.  Due for surveillance in 2024.  She underwent [facility] banding of right anterior internal hemorrhoid 10/15/2018.  Patient last seen in the clinic 10/18/2021.  This is for hospital follow-up after admission for epigastric and right upper quadrant abdominal pain with elevated LFTs.  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  She underwent liver biopsy demonstrating acute viral hepatitis.  EGD performed inpatient that revealed known H. pylori gastritis.  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  It was suspected that she likely contracted hepatitis B sexually.  She had labs performed and was advised on lab work in 8 weeks to document recovery of hepatitis B.  She was advised to continue Protonix 40 mg daily, weight loss, Linzess 145 mcg daily.  HFP 10/21/2021 -normalized LFTs, T. bili 1.  Hepatitis B surface antigen and antibody ordered for 6 weeks after these recent labs but was not completed.  Recent labs  08/17/2022.  LFTs and T. bili within normal limits.  CBC normal, TSH normal, iron panel and ferritin normal.  Today: Constipation - Started having stomach cramps/pain with having a bowel movement. Having bloating as well - no relationship to any certain foods. Not completely emptying.  Having sporadic pains. Doesn't think she is having enough bowel movements. Having a daily incomplete bowel movement. Stools are sometimes a bristol 1, 2, 5, 6 or if she waits long enough it will come out as a bristol 3. Tried Linzess in the past and reports it did good and it was better but was still having some symptoms and then she stopped taking it after her samples ran out and never got a prescription. Took it for a couple weeks.   Hemorrhoid protruding and was having some bleeding that started a couple weeks ago. Has tried preparation H and tucks pads and did not really help.   GERD - currently not taking anything. Having symptoms at night in her sleep and has had regurgitation of acid. Taking omeprazole now over the counter now and it helps some but does not take the pain away.  Does have some epigastric discomfort as well.     Current Outpatient Medications  Medication Sig Dispense Refill   albuterol (VENTOLIN HFA) 108 (90 Base) MCG/ACT inhaler Inhale 2 puffs into the lungs every 4 (four) hours as needed for wheezing or shortness of breath.      amLODipine (NORVASC) 5 MG tablet TAKE 1 TABLET BY MOUTH EVERY DAY (Patient taking differently: Take 5 mg by mouth every morning.) 30  tablet 2   hydrocortisone (ANUSOL-HC) 2.5 % rectal cream Place 1 Application rectally 2 (two) times daily. 30 g 1   losartan-hydrochlorothiazide (HYZAAR) 100-25 MG tablet Take 1 tablet by mouth daily. 90 tablet 0   meloxicam (MOBIC) 7.5 MG tablet Take 15 mg by mouth daily.     metoprolol succinate (TOPROL-XL) 50 MG 24 hr tablet Take 50 mg by mouth every morning.   0   omeprazole (PRILOSEC) 40 MG capsule Take 1 capsule (40 mg total) by  mouth daily. 30 capsule 1   rizatriptan (MAXALT) 10 MG tablet Take 1 tablet (10 mg total) by mouth as needed for migraine. May repeat in 2 hours if needed. Do not exceed 20 mg in 24 hours. 10 tablet 0   No current facility-administered medications for this visit.    Past Medical History:  Diagnosis Date   ADHD    Allergies    Anemia    Arthritis    Asthma    Biliary acute pancreatitis 2010   Colon cancer ([place]) 2016   Transverse colon    Diabetes mellitus without complication ([place])    Dyspnea    GERD (gastroesophageal reflux disease)    Hypertension    Hypertension    Migraine     Past Surgical History:  Procedure Laterality Date   ABLATION     BAND HEMORRHOIDECTOMY     BIOPSY  09/02/2021   Procedure: BIOPSY;  Surgeon: Eloise Harman, DO;  [facility];  Service: Endoscopy;;   BREAST REDUCTION SURGERY Bilateral    CHOLECYSTECTOMY  2010   [place], biliary pancreatitis   COLECTOMY  09/30/2015   distal transverse d/t adenocarcinoma   COLONOSCOPY N/A 06/11/2018   Procedure: COLONOSCOPY;  Surgeon: Daneil Dolin, MD;  [facility];  Service: Endoscopy;  Laterality: N/A;  1:45pm   DILITATION & CURRETTAGE/HYSTROSCOPY WITH NOVASURE ABLATION N/A 09/10/2018   Procedure: DILATATION & CURETTAGE/HYSTEROSCOPY WITH MINERVA  ENDOMETRIAL ABLATION;  Surgeon: Jonnie Kind, MD;  Location: [facility];  Service: Gynecology;  Laterality: N/A;   ESOPHAGOGASTRODUODENOSCOPY (EGD) WITH PROPOFOL N/A 09/02/2021   Procedure: ESOPHAGOGASTRODUODENOSCOPY (EGD) WITH PROPOFOL;  Surgeon: Eloise Harman, DO;  [facility];  Service: Endoscopy;  Laterality: N/A;   POLYPECTOMY  06/11/2018   Procedure: POLYPECTOMY;  Surgeon: Daneil Dolin, MD;  [facility];  Service: Endoscopy;;  colon    Family History  Problem Relation Age of Onset   Hypertension Mother    Diabetes Mellitus II Mother    Heart disease Sister        dx in late 30's   Breast cancer  Paternal Grandmother    Breast cancer Paternal Aunt    Colon cancer Neg Hx    Liver disease Neg Hx     Allergies as of 09/04/2022 - Review Complete 09/04/2022  Allergen Reaction Noted   Lisinopril Cough 05/08/2018    Social History   Socioeconomic History   Marital status: Single    Spouse name: Not on file   Number of children: Not on file   Years of education: Not on file   Highest education level: Not on file  Occupational History   Occupation: Public affairs consultant  Tobacco Use   Smoking status: Never   Smokeless tobacco: Never  Vaping Use   Vaping Use: Never used  Substance and Sexual Activity   Alcohol use: Yes    Comment: rare   Drug use: No   Sexual activity: Yes  Birth control/protection: None, Surgical    Comment: ablation  Other Topics Concern   Not on file  Social History Narrative   Not on file   Social Determinants of Health   Financial Resource Strain: Not on file  Food Insecurity: Not on file  Transportation Needs: Not on file  Physical Activity: Not on file  Stress: Not on file  Social Connections: Not on file     Review of Systems   Gen: Denies fever, chills, anorexia. Denies fatigue, weakness, weight loss.  CV: Denies chest pain, palpitations, syncope, peripheral edema, and claudication. Resp: Denies dyspnea at rest, cough, wheezing, coughing up blood, and pleurisy. GI: See HPI Derm: Denies rash, itching, dry skin Psych: Denies depression, anxiety, memory loss, confusion. No homicidal or suicidal ideation.  Heme: Denies bruising, bleeding, and enlarged lymph nodes.   Physical Exam   BP (!) 173/118 (BP Location: Right Arm, Patient Position: Sitting, Cuff Size: Large) Comment: changed BP meds a week ago  Pulse 86   Temp 97.8 F (36.6 C) (Temporal)   Ht '5\' 11"'$  (1.803 m)   Wt 277 lb 6.4 oz (125.8 kg)   LMP  (LMP Unknown) Comment: couple years ago had an ablation  SpO2 98%   BMI 38.69 kg/m   General:   Alert and oriented. No distress  noted. Pleasant and cooperative.  Head:  Normocephalic and atraumatic. Eyes:  Conjuctiva clear without scleral icterus.. Lungs:  Clear to auscultation bilaterally. No wheezes, rales, or rhonchi. No distress.  Heart:  S1, S2 present without murmurs appreciated.  Abdomen:  +BS, soft, non-distended.  Tenderness throughout mid abdomen and lower abdomen.  Mild tenderness to epigastric region.  No rebound or guarding. No HSM or masses noted. Rectal: deferred Msk:  Symmetrical without gross deformities. Normal posture. Extremities:  Without edema. Neurologic:  Alert and  oriented x4 Psych:  Alert and cooperative. Normal mood and affect.   Assessment  Shelly Larson is a 51 y.o. female with a history of history of anemia, ADHD, biliary pancreatitis, diabetes, HTN, migraines, and colon cancer s/p colectomy in September 2016 presenting today for follow-up.  Constipation/IBS-C: Has been having daily incomplete bowel movements with varying stool consistencies.  Also having sporadic abdominal pain/cramps along with bloating.  Reportedly had tried Linzess 145 mcg daily in the past and reports that it was helpful and made the pain tolerable but did not completely resolve symptoms.  She would like to try a higher dose.  We will trial 290 mcg daily, 30 minutes prior to breakfast, samples provided today.  She is to call a progress report in a couple weeks and if it is helpful the prescription will be sent in.  She may be a candidate for hemorrhoid banding in the future.   GERD: At her last visit she reported her symptoms were fairly controlled but she has since stopped taking pantoprazole.  She recently started taking omeprazole over-the-counter and reports that that has helped with some of her epigastric pain and indigestion but does not completely help with her symptoms.  Reports symptoms typically occurring at night as she has had regurgitation of acid.  We will increase her omeprazole to 40 mg daily, advised her  to begin taking it 30 minutes prior to dinner given she has symptoms primarily in the evenings.  GERD diet/lifestyle modifications reinforced.   Hemorrhoids: Reports she has had some intermittent rectal bleeding that started a couple weeks ago after bowel movements.  She reports her hemorrhoids do protrude at times  and she has tried Preparation H and Tucks pads without much relief.  Suspect that she may have relief of hemorrhoids with control of constipation and will trial prescription Anusol cream twice daily for a week.  May need repeat hemorrhoid banding in the future.  History of hepatitis B: Previously elevated LFTs in September 2022 during hospitalization.  Serologic work-up for viral hepatitis revealed positive hepatitis B serologies which was confirmed with liver biopsy.  She had remote history of HSV-2 with no staining biopsy for HSV and presumably contracted it sexually.  She had follow-up lab work dated November revealing normalized LFTs, confirmation with hepatitis B surface antigen and antibody recommended but not completed.  Most recent lab work 08/17/2022 with normal LFTs.  PLAN   Linzess 290 mcg samples, will send prescription if helpful.  Progress report in 1 to 2 weeks. Omeprazole 40 mg daily, prior to dinner.  GERD diet/ lifestyle modifications Anusol twice daily for 1 week then as needed. Could consider repeat hemorrhoid banding if symptoms continue. Follow up in 3 months.     Venetia Night, MSN, FNP-BC, AGACNP-BC [facility]

## 2022-09-04 ENCOUNTER — Ambulatory Visit (INDEPENDENT_AMBULATORY_CARE_PROVIDER_SITE_OTHER): Payer: Self-pay | Admitting: Gastroenterology

## 2022-09-04 ENCOUNTER — Encounter: Payer: Self-pay | Admitting: Gastroenterology

## 2022-09-04 VITALS — BP 173/118 | HR 86 | Temp 97.8°F | Ht 71.0 in | Wt 277.4 lb

## 2022-09-04 DIAGNOSIS — B169 Acute hepatitis B without delta-agent and without hepatic coma: Secondary | ICD-10-CM

## 2022-09-04 DIAGNOSIS — K219 Gastro-esophageal reflux disease without esophagitis: Secondary | ICD-10-CM

## 2022-09-04 DIAGNOSIS — K581 Irritable bowel syndrome with constipation: Secondary | ICD-10-CM

## 2022-09-04 DIAGNOSIS — K641 Second degree hemorrhoids: Secondary | ICD-10-CM

## 2022-09-04 MED ORDER — OMEPRAZOLE 40 MG PO CPDR: 40.0000 mg | DELAYED_RELEASE_CAPSULE | Freq: Every day | ORAL | 1 refills | Status: DC

## 2022-09-04 MED ORDER — HYDROCORTISONE (PERIANAL) 2.5 % EX CREA: 1.0000 | TOPICAL_CREAM | Freq: Two times a day (BID) | CUTANEOUS | 1 refills | Status: DC

## 2022-09-04 NOTE — Patient Instructions (Addendum)
For your constipation: Increasing fiber in your diet may also help you with your constipation and provide better relief to allow for complete emptying.  You can do this by supplementing with something like Benefiber, 2 to 3 teaspoons daily. I am providing you with samples of Linzess 290 mcg. How to take Linzess: Once a day every day on empty stomach, at least 30 minutes before your first meal of the day. It is best to keep medications at a stable temperature Medication is best kept in its original bottle with the disket present.  It is a medication that is meant for everyday use and not to be used as needed.  What to expect: Constipation relief is typically felt in about 1 week Relief of abdominal pain, discomfort, and bloating begins in about 1 week with symptoms typically improving over 12 weeks and beyond. Diarrhea is most common side effect and typically begins within the first 2 weeks and can take 3-4 weeks to resolve It would be helpful to begin treatment over the weekend or when you can be closer to a bathroom   You can go to [email or web address] for patient support and sign up for daily medication reminders.   Please let me know how you are doing with the samples, I will send a prescription if it is helpful.   For your reflux: I am sending in omeprazole 40 mg for you to take daily 30 minutes prior to dinner. Follow a GERD diet/lifestyle:  Avoid fried, fatty, greasy, spicy, citrus foods. Avoid caffeine and carbonated beverages. Avoid chocolate. Try eating 4-6 small meals a day rather than 3 large meals. Do not eat within 3 hours of laying down. Prop head of bed up on wood or bricks to create a 6 inch incline.  For hemorrhoids I have sent in Anusol rectal cream for you to use twice daily for 1 week, then as needed.  If you continue to have issues even after control your constipation please feel free to reach out to me and we can pursue additional hemorrhoid banding if  needed.  I will have you follow-up in about 3 months.  It was a pleasure to see you today. I want to create trusting relationships with patients. If you receive a survey regarding your visit,  I greatly appreciate you taking time to fill this out on paper or through your MyChart. I value your feedback.  Venetia Night, MSN, FNP-BC, AGACNP-BC [facility]

## 2022-09-06 ENCOUNTER — Ambulatory Visit: Payer: Managed Care, Other (non HMO) | Admitting: Gastroenterology

## 2022-09-11 ENCOUNTER — Encounter: Payer: Self-pay | Admitting: Physician Assistant

## 2022-09-11 ENCOUNTER — Ambulatory Visit: Payer: Managed Care, Other (non HMO) | Admitting: Physician Assistant

## 2022-09-11 VITALS — BP 150/92 | HR 70 | Temp 97.3°F | Ht 71.0 in | Wt 273.8 lb

## 2022-09-11 DIAGNOSIS — F419 Anxiety disorder, unspecified: Secondary | ICD-10-CM

## 2022-09-11 DIAGNOSIS — G479 Sleep disorder, unspecified: Secondary | ICD-10-CM

## 2022-09-11 DIAGNOSIS — G43001 Migraine without aura, not intractable, with status migrainosus: Secondary | ICD-10-CM

## 2022-09-11 DIAGNOSIS — F909 Attention-deficit hyperactivity disorder, unspecified type: Secondary | ICD-10-CM | POA: Diagnosis not present

## 2022-09-11 DIAGNOSIS — I10 Essential (primary) hypertension: Secondary | ICD-10-CM | POA: Diagnosis not present

## 2022-09-11 MED ORDER — AMPHETAMINE-DEXTROAMPHET ER 30 MG PO CP24: 30.0000 mg | ORAL_CAPSULE | Freq: Every day | ORAL | 0 refills | Status: DC

## 2022-09-11 NOTE — Progress Notes (Signed)
Subjective:    Patient ID: Shelly Larson, female    DOB: Dec 20, 1971, 51 y.o.   MRN: [id number]  Chief Complaint  Patient presents with   Follow-up    Pt states she had anxiety attack on Wednesday of last week, felt like she could hear everything, had to leave work because she couldn't cope. Pt is having ongoing headache as we speak.    HPI Patient is in today for follow-up.  Has a migraine currently; took a Maxalt 10 mg this morning and hoping it helps. Last took about 10 days ago and numbed the pain down. Appt with Dr. Tomi Likens is in November.   Blood pressure does seem to be running better 130s-140/ 70s-80s at home.   5 days ago had a panic episode at work. All sounds were exacerbated around her. Ended up having to go home, but had to sit in the car for awhile first. Sleep is not good per patient. Very irregular patterns.  Adderall for a long time did help her focus and kept her stress down. Strattera made her too tired.  Wants to start on Adderall again.   Past Medical History:  Diagnosis Date   ADHD    Allergies    Anemia    Arthritis    Asthma    Biliary acute pancreatitis 2010   Colon cancer ([place]) 2016   Transverse colon    Diabetes mellitus without complication ([place])    Dyspnea    GERD (gastroesophageal reflux disease)    Hypertension    Hypertension    Migraine     Past Surgical History:  Procedure Laterality Date   ABLATION     BAND HEMORRHOIDECTOMY     BIOPSY  09/02/2021   Procedure: BIOPSY;  Surgeon: Eloise Harman, DO;  [facility];  Service: Endoscopy;;   BREAST REDUCTION SURGERY Bilateral    CHOLECYSTECTOMY  2010   [place], biliary pancreatitis   COLECTOMY  09/30/2015   distal transverse d/t adenocarcinoma   COLONOSCOPY N/A 06/11/2018   Procedure: COLONOSCOPY;  Surgeon: Daneil Dolin, MD;  [facility];  Service: Endoscopy;  Laterality: N/A;  1:45pm   DILITATION & CURRETTAGE/HYSTROSCOPY WITH NOVASURE ABLATION N/A  09/10/2018   Procedure: DILATATION & CURETTAGE/HYSTEROSCOPY WITH MINERVA  ENDOMETRIAL ABLATION;  Surgeon: Jonnie Kind, MD;  Location: [facility];  Service: Gynecology;  Laterality: N/A;   ESOPHAGOGASTRODUODENOSCOPY (EGD) WITH PROPOFOL N/A 09/02/2021   Procedure: ESOPHAGOGASTRODUODENOSCOPY (EGD) WITH PROPOFOL;  Surgeon: Eloise Harman, DO;  [facility];  Service: Endoscopy;  Laterality: N/A;   POLYPECTOMY  06/11/2018   Procedure: POLYPECTOMY;  Surgeon: Daneil Dolin, MD;  [facility];  Service: Endoscopy;;  colon    Family History  Problem Relation Age of Onset   Hypertension Mother    Diabetes Mellitus II Mother    Heart disease Sister        dx in late 72's   Breast cancer Paternal Grandmother    Breast cancer Paternal Aunt    Colon cancer Neg Hx    Liver disease Neg Hx     Social History   Tobacco Use   Smoking status: Never   Smokeless tobacco: Never  Vaping Use   Vaping Use: Never used  Substance Use Topics   Alcohol use: Yes    Comment: rare   Drug use: No     Allergies  Allergen Reactions   Lisinopril Cough    Review of Systems NEGATIVE UNLESS  OTHERWISE INDICATED IN HPI      Objective:     BP (!) 150/92   Pulse 70   Temp (!) 97.3 F (36.3 C) (Temporal)   Ht '5\' 11"'$  (1.803 m)   Wt 273 lb 12.8 oz (124.2 kg)   LMP 08/11/2022 (Exact Date) Comment: couple years ago had an ablation  SpO2 97%   BMI 38.19 kg/m   Wt Readings from Last 3 Encounters:  09/13/22 273 lb 3.2 oz (123.9 kg)  09/11/22 273 lb 12.8 oz (124.2 kg)  09/04/22 277 lb 6.4 oz (125.8 kg)    BP Readings from Last 3 Encounters:  09/13/22 (!) 143/92  09/11/22 (!) 150/92  09/04/22 (!) 173/118     Physical Exam Vitals and nursing note reviewed.  Constitutional:      Appearance: Normal appearance. She is obese. She is not toxic-appearing.  HENT:     Head: Normocephalic and atraumatic.  Eyes:     Extraocular Movements: Extraocular movements intact.      Conjunctiva/sclera: Conjunctivae normal.     Pupils: Pupils are equal, round, and reactive to light.  Cardiovascular:     Rate and Rhythm: Normal rate and regular rhythm.     Pulses: Normal pulses.     Heart sounds: Normal heart sounds.  Pulmonary:     Effort: Pulmonary effort is normal.     Breath sounds: Normal breath sounds.  Musculoskeletal:        General: Normal range of motion.     Cervical back: Normal range of motion and neck supple.  Skin:    General: Skin is warm and dry.  Neurological:     General: No focal deficit present.     Mental Status: She is alert and oriented to person, place, and time.     Cranial Nerves: No cranial nerve deficit.     Motor: No weakness.  Psychiatric:        Mood and Affect: Mood normal.        Behavior: Behavior normal.        Thought Content: Thought content normal.        Judgment: Judgment normal.        Assessment & Plan:  Migraine without aura and with status migrainosus, not intractable -     Ambulatory referral to Sleep Studies  Essential hypertension -     Ambulatory referral to Sleep Studies  Difficulty sleeping -     Ambulatory referral to Sleep Studies  Adult ADHD  Anxiety -     Ambulatory referral to Sleep Studies  Other orders -     Amphetamine-Dextroamphet ER; Take 1 capsule (30 mg total) by mouth daily.  Dispense: 30 capsule; Refill: 0   Let me know if migraine holds on this week - can always consider Toradol if Maxalt not working. Glad you're getting in with Dr. Tomi Likens  Underlying sleep contributing to your diagnoses? Sleep study advised - order placed.  Start back on Adderall XR 30 mg daily - should help focus / overwhelm as it has before. PDMP reviewed today, no red flags, previously filling appropriately.   Need to monitor BP readings at home. Continue on Norvasc 5 mg, Hyzaar 100-25 mg, Toprol-XL 50 mg.    Return in about 4 weeks (around 10/09/2022) for recheck.  This note was prepared with  assistance of Systems analyst. Occasional wrong-word or sound-a-like substitutions may have occurred due to the inherent limitations of voice recognition software.  Time Spent: 30 minutes of  total time was spent on the date of the encounter performing the following actions: chart review prior to seeing the patient, obtaining history, performing a medically necessary exam, counseling on the treatment plan, placing orders, and documenting in our EHR.       [place] Augustine M Tarance Balan, PA-C

## 2022-09-11 NOTE — Patient Instructions (Addendum)
Let me know if migraine holds on this week - can always consider Toradol if Maxalt not working. Glad you're getting in with Dr. Tomi Likens  Underlying sleep contributing to your diagnoses? Sleep study advised - order placed.  Start back on Adderall XR 30 mg daily - should help focus / overwhelm as it has before  Need to monitor BP readings at home

## 2022-09-13 ENCOUNTER — Ambulatory Visit: Payer: Managed Care, Other (non HMO) | Admitting: Physician Assistant

## 2022-09-13 ENCOUNTER — Encounter: Payer: Self-pay | Admitting: Physician Assistant

## 2022-09-13 VITALS — BP 143/92 | HR 75 | Temp 98.6°F | Ht 71.0 in | Wt 273.2 lb

## 2022-09-13 DIAGNOSIS — G43001 Migraine without aura, not intractable, with status migrainosus: Secondary | ICD-10-CM | POA: Diagnosis not present

## 2022-09-13 MED ORDER — KETOROLAC TROMETHAMINE 60 MG/2ML IM SOLN
60.0000 mg | Freq: Once | INTRAMUSCULAR | Status: AC
  Administered 2022-09-13: 60 mg via INTRAMUSCULAR

## 2022-09-13 MED ORDER — PROMETHAZINE HCL 25 MG/ML IJ SOLN
25.0000 mg | Freq: Once | INTRAMUSCULAR | Status: AC
  Administered 2022-09-13: 25 mg via INTRAMUSCULAR

## 2022-09-13 NOTE — Progress Notes (Signed)
Subjective:    Patient ID: Shelly Larson, female    DOB: [birth year], 51 y.o.   MRN: [id number]  Chief Complaint  Patient presents with   Migraine    Same migraine from Monday.     HPI Patient is in today for same migraine from two days ago. Tried Excedrin yesterday, dulled it, but now worse. Left work early. Took Maxalt 10 mg and laid down, got back up around 8 pm last night and still no relief. Called into work this morning again. Took another Maxalt 10 mg this morning. Was 8/10 pain, now as steady 5/10 pain. Right sided temporal side, goes behind her eye. Getting some floaters. Vomited last night one time, still has some nausea. No symptoms at this time.   Past Medical History:  Diagnosis Date   ADHD    Allergies    Anemia    Arthritis    Asthma    Biliary acute pancreatitis 2010   Colon cancer ([place]) 2016   Transverse colon    Diabetes mellitus without complication ([place])    Dyspnea    GERD (gastroesophageal reflux disease)    Hypertension    Hypertension    Migraine     Past Surgical History:  Procedure Laterality Date   ABLATION     BAND HEMORRHOIDECTOMY     BIOPSY  09/02/2021   Procedure: BIOPSY;  Surgeon: Eloise Harman, DO;  [facility];  Service: Endoscopy;;   BREAST REDUCTION SURGERY Bilateral    CHOLECYSTECTOMY  2010   [place], biliary pancreatitis   COLECTOMY  09/30/2015   distal transverse d/t adenocarcinoma   COLONOSCOPY N/A 06/11/2018   Procedure: COLONOSCOPY;  Surgeon: Daneil Dolin, MD;  [facility];  Service: Endoscopy;  Laterality: N/A;  1:45pm   DILITATION & CURRETTAGE/HYSTROSCOPY WITH NOVASURE ABLATION N/A 09/10/2018   Procedure: DILATATION & CURETTAGE/HYSTEROSCOPY WITH MINERVA  ENDOMETRIAL ABLATION;  Surgeon: Jonnie Kind, MD;  Location: [facility];  Service: Gynecology;  Laterality: N/A;   ESOPHAGOGASTRODUODENOSCOPY (EGD) WITH PROPOFOL N/A 09/02/2021   Procedure: ESOPHAGOGASTRODUODENOSCOPY (EGD) WITH PROPOFOL;   Surgeon: Eloise Harman, DO;  [facility];  Service: Endoscopy;  Laterality: N/A;   POLYPECTOMY  06/11/2018   Procedure: POLYPECTOMY;  Surgeon: Daneil Dolin, MD;  [facility];  Service: Endoscopy;;  colon    Family History  Problem Relation Age of Onset   Hypertension Mother    Diabetes Mellitus II Mother    Heart disease Sister        dx in late 40's   Breast cancer Paternal Grandmother    Breast cancer Paternal Aunt    Colon cancer Neg Hx    Liver disease Neg Hx     Social History   Tobacco Use   Smoking status: Never   Smokeless tobacco: Never  Vaping Use   Vaping Use: Never used  Substance Use Topics   Alcohol use: Yes    Comment: rare   Drug use: No     Allergies  Allergen Reactions   Lisinopril Cough    Review of Systems NEGATIVE UNLESS OTHERWISE INDICATED IN HPI      Objective:     BP (!) 143/92 (BP Location: Left Arm, Patient Position: Sitting)   Pulse 75   Temp 98.6 F (37 C) (Temporal)   Ht '5\' 11"'$  (1.803 m)   Wt 273 lb 3.2 oz (123.9 kg)   LMP 08/11/2022 (Exact Date) Comment: couple years ago had an  ablation  SpO2 98%   BMI 38.10 kg/m   Wt Readings from Last 3 Encounters:  09/13/22 273 lb 3.2 oz (123.9 kg)  09/11/22 273 lb 12.8 oz (124.2 kg)  09/04/22 277 lb 6.4 oz (125.8 kg)    BP Readings from Last 3 Encounters:  09/13/22 (!) 143/92  09/11/22 (!) 150/92  09/04/22 (!) 173/118     Physical Exam Vitals and nursing note reviewed.  Constitutional:      Appearance: Normal appearance. She is obese. She is not toxic-appearing.  HENT:     Head: Normocephalic and atraumatic.  Eyes:     Extraocular Movements: Extraocular movements intact.     Conjunctiva/sclera: Conjunctivae normal.     Pupils: Pupils are equal, round, and reactive to light.  Cardiovascular:     Rate and Rhythm: Normal rate and regular rhythm.     Pulses: Normal pulses.     Heart sounds: Normal heart sounds.  Pulmonary:     Effort:  Pulmonary effort is normal.     Breath sounds: Normal breath sounds.  Musculoskeletal:        General: Normal range of motion.     Cervical back: Normal range of motion and neck supple.  Skin:    General: Skin is warm and dry.  Neurological:     General: No focal deficit present.     Mental Status: She is alert and oriented to person, place, and time.  Psychiatric:        Mood and Affect: Mood normal.        Behavior: Behavior normal.        Assessment & Plan:  Migraine without aura and with status migrainosus, not intractable -     Promethazine HCl -     Ketorolac Tromethamine   Typical migraine for pt, no focal neuro deficits; Gave injections of Toradol 60 mg and Phenergan 25 mg IM today; Pt aware of risks vs benefits and possible adverse reactions. She will go right home, take one tablet of Benadryl, and rest. Call with update tomorrow.  ED if acutely worse. Pt agreeable and understanding.      Miriya Cloer M Bernadetta Roell, PA-C

## 2022-09-24 ENCOUNTER — Other Ambulatory Visit: Payer: Self-pay | Admitting: Physician Assistant

## 2022-09-25 ENCOUNTER — Encounter: Payer: Managed Care, Other (non HMO) | Admitting: Obstetrics & Gynecology

## 2022-09-27 ENCOUNTER — Other Ambulatory Visit: Payer: Self-pay | Admitting: Physician Assistant

## 2022-10-09 ENCOUNTER — Ambulatory Visit: Payer: Managed Care, Other (non HMO) | Admitting: Physician Assistant

## 2022-10-09 ENCOUNTER — Encounter: Payer: Self-pay | Admitting: Physician Assistant

## 2022-10-09 VITALS — BP 132/84 | HR 64 | Temp 97.7°F | Ht 71.0 in | Wt 271.2 lb

## 2022-10-09 DIAGNOSIS — I10 Essential (primary) hypertension: Secondary | ICD-10-CM | POA: Diagnosis not present

## 2022-10-09 DIAGNOSIS — F909 Attention-deficit hyperactivity disorder, unspecified type: Secondary | ICD-10-CM | POA: Diagnosis not present

## 2022-10-09 DIAGNOSIS — H6123 Impacted cerumen, bilateral: Secondary | ICD-10-CM

## 2022-10-09 MED ORDER — AMPHETAMINE-DEXTROAMPHET ER 30 MG PO CP24: 30.0000 mg | ORAL_CAPSULE | ORAL | 0 refills | Status: DC

## 2022-10-09 MED ORDER — AMPHETAMINE-DEXTROAMPHETAMINE 15 MG PO TABS: 15.0000 mg | ORAL_TABLET | Freq: Every day | ORAL | 0 refills | Status: DC

## 2022-10-09 MED ORDER — AMPHETAMINE-DEXTROAMPHETAMINE 15 MG PO TABS: ORAL_TABLET | ORAL | 0 refills | Status: DC

## 2022-10-09 MED ORDER — AMPHETAMINE-DEXTROAMPHETAMINE 15 MG PO TABS: ORAL_TABLET | ORAL | 0 refills | Status: AC

## 2022-10-09 NOTE — Assessment & Plan Note (Signed)
Stable, at goal Continue Amlodipine 5 mg daily Cont Losartan-HCTZ 100-25 mg Cont Toprol XL 50 mg Monitor at home Lifestyle changes encouraged

## 2022-10-09 NOTE — Patient Instructions (Signed)
Great to see you today  Monitor BP at home - readings looking much better  Take adderall as directed  Keep specialist appts as scheduled  Call if concerns

## 2022-10-09 NOTE — Progress Notes (Signed)
Subjective:    Patient ID: Shelly Larson, female    DOB: 03/31/1971, 51 y.o.   MRN: [id number]  Chief Complaint  Patient presents with   Follow-up    Pt in for f/u and re check; pt would like to have ears checked, hearing some swooshing and beeping sounds, and pt is concerned ears not producing any wax the past few months; looked in both ears and need wax removal;     HPI Patient is in today for ADHD, BP follow-up. Also wants her ears checked.   Past Medical History:  Diagnosis Date   ADHD    Allergies    Anemia    Arthritis    Asthma    Biliary acute pancreatitis 2010   Colon cancer ([place]) 2016   Transverse colon    Diabetes mellitus without complication ([place])    Dyspnea    GERD (gastroesophageal reflux disease)    Hypertension    Hypertension    Migraine     Past Surgical History:  Procedure Laterality Date   ABLATION     BAND HEMORRHOIDECTOMY     BIOPSY  09/02/2021   Procedure: BIOPSY;  Surgeon: Eloise Harman, DO;  [facility];  Service: Endoscopy;;   BREAST REDUCTION SURGERY Bilateral    CHOLECYSTECTOMY  2010   [place], biliary pancreatitis   COLECTOMY  09/30/2015   distal transverse d/t adenocarcinoma   COLONOSCOPY N/A 06/11/2018   Procedure: COLONOSCOPY;  Surgeon: Daneil Dolin, MD;  [facility];  Service: Endoscopy;  Laterality: N/A;  1:45pm   DILITATION & CURRETTAGE/HYSTROSCOPY WITH NOVASURE ABLATION N/A 09/10/2018   Procedure: DILATATION & CURETTAGE/HYSTEROSCOPY WITH MINERVA  ENDOMETRIAL ABLATION;  Surgeon: Jonnie Kind, MD;  [facility];  Service: Gynecology;  Laterality: N/A;   ESOPHAGOGASTRODUODENOSCOPY (EGD) WITH PROPOFOL N/A 09/02/2021   Procedure: ESOPHAGOGASTRODUODENOSCOPY (EGD) WITH PROPOFOL;  Surgeon: Eloise Harman, DO;  [facility];  Service: Endoscopy;  Laterality: N/A;   POLYPECTOMY  06/11/2018   Procedure: POLYPECTOMY;  Surgeon: Daneil Dolin, MD;  [facility];  Service:  Endoscopy;;  colon    Family History  Problem Relation Age of Onset   Hypertension Mother    Diabetes Mellitus II Mother    Heart disease Sister        dx in late 101's   Breast cancer Paternal Grandmother    Breast cancer Paternal Aunt    Colon cancer Neg Hx    Liver disease Neg Hx     Social History   Tobacco Use   Smoking status: Never   Smokeless tobacco: Never  Vaping Use   Vaping Use: Never used  Substance Use Topics   Alcohol use: Yes    Comment: rare   Drug use: No     Allergies  Allergen Reactions   Lisinopril Cough    Review of Systems NEGATIVE UNLESS OTHERWISE INDICATED IN HPI      Objective:     BP 132/84 (BP Location: Left Arm)   Pulse 64   Temp 97.7 F (36.5 C) (Temporal)   Ht '5\' 11"'$  (1.803 m)   Wt 271 lb 3.2 oz (123 kg)   LMP 08/11/2022 (Exact Date) Comment: couple years ago had an ablation  SpO2 96%   BMI 37.82 kg/m   Wt Readings from Last 3 Encounters:  10/09/22 271 lb 3.2 oz (123 kg)  09/13/22 273 lb 3.2 oz (123.9 kg)  09/11/22 273 lb 12.8 oz (124.2 kg)  BP Readings from Last 3 Encounters:  10/09/22 132/84  09/13/22 (!) 143/92  09/11/22 (!) 150/92     Physical Exam Vitals and nursing note reviewed.  Constitutional:      Appearance: Normal appearance.  HENT:     Right Ear: There is impacted cerumen.     Left Ear: There is impacted cerumen.  Cardiovascular:     Rate and Rhythm: Normal rate and regular rhythm.     Pulses: Normal pulses.  Pulmonary:     Effort: Pulmonary effort is normal.     Breath sounds: Normal breath sounds.  Neurological:     Mental Status: She is alert.  Psychiatric:        Mood and Affect: Mood normal.        Behavior: Behavior normal.        Assessment & Plan:  Adult ADHD  Essential hypertension  Bilateral impacted cerumen  Other orders -     Amphetamine-Dextroamphet ER; Take 1 capsule (30 mg total) by mouth every morning.  Dispense: 30 capsule; Refill: 0 -      Amphetamine-Dextroamphet ER; Take 1 capsule (30 mg total) by mouth every morning.  Dispense: 30 capsule; Refill: 0 -     Amphetamine-Dextroamphet ER; Take 1 capsule (30 mg total) by mouth every morning.  Dispense: 30 capsule; Refill: 0 -     Amphetamine-Dextroamphetamine; Take 1 tab po around 2 pm  Dispense: 30 tablet; Refill: 0 -     Amphetamine-Dextroamphetamine; Take 1 tab po around 2 pm  Dispense: 30 tablet; Refill: 0 -     Amphetamine-Dextroamphetamine; Take 1 tab po around 2 pm  Dispense: 30 tablet; Refill: 0     Ear wax procedure: Ceruminosis is noted. Removal procedure explained and verbal consent obtained from patient. Wax is removed by syringing and manual debridement from bilateral ear canals. Pt tolerated procedure well. Instructions for home care to prevent wax buildup are given.    Return in about 3 months (around 01/09/2023) for med recheck.    Majel Giel M Jacey Eckerson, PA-C

## 2022-10-09 NOTE — Assessment & Plan Note (Signed)
Doing well with Adderall XR 30 mg, sometimes needs help in the afternoons, wears off around 2 pm. Will Rx Adderall IR 15 mg to take as needed on certain days at 2 pm. PDMP reviewed today, no red flags, filling appropriately.  Contract signed. No SE's reported

## 2022-10-10 ENCOUNTER — Encounter: Payer: Self-pay | Admitting: Obstetrics & Gynecology

## 2022-10-10 DIAGNOSIS — Z0289 Encounter for other administrative examinations: Secondary | ICD-10-CM

## 2022-10-11 ENCOUNTER — Telehealth: Payer: Self-pay | Admitting: *Deleted

## 2022-10-11 NOTE — Telephone Encounter (Signed)
Pt called and states that Linzess 275mg works well for her and would like a prescription sent to [facility] on [address]

## 2022-10-12 ENCOUNTER — Other Ambulatory Visit: Payer: Self-pay | Admitting: Gastroenterology

## 2022-10-12 MED ORDER — LINACLOTIDE 290 MCG PO CAPS: 290.0000 ug | ORAL_CAPSULE | Freq: Every day | ORAL | 5 refills | Status: DC

## 2022-10-17 NOTE — Telephone Encounter (Signed)
Noted. Informed pt.  

## 2022-10-18 ENCOUNTER — Encounter: Payer: Self-pay | Admitting: Neurology

## 2022-10-18 ENCOUNTER — Institutional Professional Consult (permissible substitution): Payer: Managed Care, Other (non HMO) | Admitting: Neurology

## 2022-10-24 NOTE — Progress Notes (Signed)
Initial neurology clinic note  CHENEY GOSCH MRN: [id number] DOB: 1971/10/10  Referring provider: Allwardt, Randa Evens, PA-C  Primary care provider: Allwardt, Randa Evens, PA-C  Reason for consult:  headaches  Subjective:  This is Ms. [name], a 51 y.o. right-handed female with a medical history of migraine, HTN, DM, hep B, ADHD, colon cancer, anxiety, OA, plantar fasciitis who presents to neurology clinic with headaches. The patient is alone today.  Patient has had headaches for a lot of her life. They went away when she was in her 37s. About 4-5 years ago, she began having headaches again. She was on metoprolol, other BP meds, and topirimate for prevention. She was on sumatriptan for abortive therapy. This was working until recently. She was getting 3-4 headaches per month at that time. The sumatriptan would make her headaches tolerable. Patient stopped taking topirimate about 1 year ago.   Over the last 2-3 months, she has had worsening headaches. She is having more frequent headaches. PCP switched patient rizatriptan 10 mg PRN. This does not help much. She has not identified a clear trigger.   The headaches patient has now are similar to headaches she had when she was younger. Most of the time the pain is behind both eyes. She describes a "banging, piercing sensation." She endorses photophobia, phonophobia, nausea, and vomiting. Movement and activity makes it worse. She also has aura (flashing lights, vision changes, floaters, dizziness like sensation). The headaches last about 1-1.5 days before dying down to a tolerable nagging pain. At the worst, the pain is 9/10. She has about 6-8 headaches per month. She always gets at least 1 every week, maybe 2-3. There is no positional component per patient. Her headaches slowly build to maximal intensity.  Patient has been having night sweats for at least 6-7 months and hot flashes for about a year. Patient had an ablation 2-3 years ago. She  occasionally has occasional spotting that she calls her fake cycle. She gets a migraine when this happens. She is not sleeping well, maybe sleeping 3-4 hours a night because of hot flashes, sweats, or headaches. She is not hormonal therapy.  She takes OTC about 3 doses per week and triptan 1-2 times per week (total of 5 doses per week, or 20 total doses per month).  Patient occasionally goes to the ED for acute treatment, about 6 times in the last year, due to headache that she cannot control. She last went to the ED around 06/2022.  Past NSAIDS/analgesics:  tylenol, excedrin, ibuprofen Past abortive triptans:  sumatriptan (no longer helping) Past muscle relaxants:  flexeril Past anti-emetic:  phenergan Past antihypertensive medications:  propranolol, metoprolol Past antidepressant medications:  elavil (helped a lot) Past anticonvulsant medications:  topirimate (thinks it may have helped) Past vitamins/Herbal/Supplements:  none Past antihistamines/decongestants:  bendryl  Current NSAIDS/analgesics:  tylenol, excedrin, ibuprofen Current triptans:  rizatriptan (not helping) Current ergotamine:  none Current anti-emetic:  none Current muscle relaxants:  none Current Antihypertensive medications:  metoprolol Current Antidepressant medications:  none Current Anticonvulsant medications:  none Current anti-CGRP:  none Current Vitamins/Herbal/Supplements:  none Current Antihistamines/Decongestants:  bendryl Hormone/birth control:  none  Caffeine:  occasional Alcohol:  occasional wine Smoker:  no Diet:  normal american diet Exercise:  walks 3-4 times per week (about 1 mile); gets a lot of steps in daily Depression:  none; Anxiety:  gets panic attacks and anxiety (on adderall) Other pain:  gets a pain from the neck; getting worse of late Family  history of headache:  sister had migraines   MEDICATIONS:  Outpatient Encounter Medications as of 10/27/2022  Medication Sig   albuterol  (VENTOLIN HFA) 108 (90 Base) MCG/ACT inhaler Inhale 2 puffs into the lungs every 4 (four) hours as needed for wheezing or shortness of breath.    amLODipine (NORVASC) 5 MG tablet TAKE 1 TABLET BY MOUTH EVERY DAY (Patient taking differently: Take 5 mg by mouth every morning.)   amphetamine-dextroamphetamine (ADDERALL XR) 30 MG 24 hr capsule Take 1 capsule (30 mg total) by mouth daily.   amphetamine-dextroamphetamine (ADDERALL XR) 30 MG 24 hr capsule Take 1 capsule (30 mg total) by mouth every morning. (Patient taking differently: Take 30 mg by mouth every morning. As needed)   [START ON 11/08/2022] amphetamine-dextroamphetamine (ADDERALL) 15 MG tablet Take 1 tab po around 2 pm (Patient taking differently: Take 1 tab po around 2 pm if needed)   hydrocortisone (ANUSOL-HC) 2.5 % rectal cream Place 1 Application rectally 2 (two) times daily.   linaclotide (LINZESS) 290 MCG CAPS capsule Take 1 capsule (290 mcg total) by mouth daily before breakfast.   losartan-hydrochlorothiazide (HYZAAR) 100-25 MG tablet Take 1 tablet by mouth daily.   meloxicam (MOBIC) 7.5 MG tablet Take 15 mg by mouth daily.   metoprolol succinate (TOPROL-XL) 50 MG 24 hr tablet Take 50 mg by mouth every morning.    omeprazole (PRILOSEC) 40 MG capsule Take 1 capsule (40 mg total) by mouth daily.   rizatriptan (MAXALT) 10 MG tablet TAKE 1 TABLET (10 MG TOTAL) BY MOUTH AS NEEDED FOR MIGRAINE. MAY REPEAT IN 2 HOURS IF NEEDED. DO NOT EXCEED 20 MG IN 24 HOURS.   [START ON 11/08/2022] amphetamine-dextroamphetamine (ADDERALL XR) 30 MG 24 hr capsule Take 1 capsule (30 mg total) by mouth every morning. (Patient not taking: Reported on 10/27/2022)   [START ON 12/08/2022] amphetamine-dextroamphetamine (ADDERALL XR) 30 MG 24 hr capsule Take 1 capsule (30 mg total) by mouth every morning. (Patient not taking: Reported on 10/27/2022)   amphetamine-dextroamphetamine (ADDERALL) 15 MG tablet Take 1 tab po around 2 pm (Patient not taking: Reported on  10/27/2022)   [START ON 12/08/2022] amphetamine-dextroamphetamine (ADDERALL) 15 MG tablet Take 1 tab po around 2 pm (Patient not taking: Reported on 10/27/2022)   No facility-administered encounter medications on file as of 10/27/2022.    PAST MEDICAL HISTORY: Past Medical History:  Diagnosis Date   ADHD    Allergies    Anemia    Arthritis    Asthma    Biliary acute pancreatitis 2010   Colon cancer ([place]) 2016   Transverse colon    Diabetes mellitus without complication ([place])    Dyspnea    GERD (gastroesophageal reflux disease)    Hypertension    Hypertension    Migraine     PAST SURGICAL HISTORY: Past Surgical History:  Procedure Laterality Date   ABLATION     BAND HEMORRHOIDECTOMY     BIOPSY  09/02/2021   Procedure: BIOPSY;  Surgeon: Eloise Harman, DO;  [facility];  Service: Endoscopy;;   BREAST REDUCTION SURGERY Bilateral    CHOLECYSTECTOMY  2010   [place], biliary pancreatitis   COLECTOMY  09/30/2015   distal transverse d/t adenocarcinoma   COLONOSCOPY N/A 06/11/2018   Procedure: COLONOSCOPY;  Surgeon: Daneil Dolin, MD;  [facility];  Service: Endoscopy;  Laterality: N/A;  1:45pm   DILITATION & CURRETTAGE/HYSTROSCOPY WITH NOVASURE ABLATION N/A 09/10/2018   Procedure: DILATATION & CURETTAGE/HYSTEROSCOPY WITH MINERVA  ENDOMETRIAL ABLATION;  Surgeon: Jonnie Kind, MD;  [facility];  Service: Gynecology;  Laterality: N/A;   ESOPHAGOGASTRODUODENOSCOPY (EGD) WITH PROPOFOL N/A 09/02/2021   Procedure: ESOPHAGOGASTRODUODENOSCOPY (EGD) WITH PROPOFOL;  Surgeon: Eloise Harman, DO;  [facility];  Service: Endoscopy;  Laterality: N/A;   POLYPECTOMY  06/11/2018   Procedure: POLYPECTOMY;  Surgeon: Daneil Dolin, MD;  [facility];  Service: Endoscopy;;  colon    ALLERGIES: Allergies  Allergen Reactions   Lisinopril Cough    FAMILY HISTORY: Family History  Problem Relation Age of Onset   Hypertension Mother     Diabetes Mellitus II Mother    Heart disease Sister        dx in late 21's   Breast cancer Paternal Grandmother    Breast cancer Paternal Aunt    Colon cancer Neg Hx    Liver disease Neg Hx     SOCIAL HISTORY: Social History   Tobacco Use   Smoking status: Never   Smokeless tobacco: Never  Vaping Use   Vaping Use: Never used  Substance Use Topics   Alcohol use: Yes    Comment: rare   Drug use: No   Social History   Social History Narrative   Are you right handed or left handed? right   Are you currently employed ? 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 And niece   What type of home do you live in: 1 story or 2 story? One     Caffeine 1 soda a day    Objective:  Vital Signs:  BP (!) 157/83   Pulse 67   Ht '5\' 11"'$  (1.803 m)   Wt 274 lb (124.3 kg)   PF 97 L/min   BMI 38.22 kg/m   General: No acute distress.  Patient appears well-groomed.   Head:  Normocephalic/atraumatic Eyes:  fundi examined. No obvious papilledema Neck: supple, Positive for paraspinal tenderness, full range of motion Heart: regular rate and rhythm Lungs: Clear to auscultation bilaterally. Vascular: No carotid bruits.  Neurological Exam: Mental status: alert and oriented, speech fluent and not dysarthric, language intact.  Cranial nerves: CN I: not tested CN II: pupils equal, round and reactive to light, visual fields intact CN III, IV, VI:  full range of motion, no nystagmus, no ptosis CN V: facial sensation intact. CN VII: upper and lower face symmetric CN VIII: hearing intact CN IX, X: gag intact, uvula midline CN XI: sternocleidomastoid and trapezius muscles intact CN XII: tongue midline  Bulk & Tone: normal, no fasciculations. Motor:  muscle strength 5/5 throughout Deep Tendon Reflexes:  2+ throughout   Sensation:  Sensation intact to light touch Finger to nose testing:  Without dysmetria.   Gait:  Normal  station and stride.  Romberg negative.   Labs and Imaging review: Internal labs: Lab Results  Component Value Date   HGBA1C 6.3 08/17/2022   No results found for: "VITAMINB12" Lab Results  Component Value Date   TSH 1.42 08/17/2022   Lab Results  Component Value Date   ESRSEDRATE 25 (H) 08/31/2021   Normal or unremarkable: CBC, CMP  Iron studies: iron 81, TIBC 364, sat ratio 22.3, ferritin 39.7  Assessment/Plan:  JOYCLYN PLAZOLA is a 51 y.o. female who presents for evaluation of headaches. She has a relevant medical history of migraine, HTN, DM, hep B, ADHD, colon cancer, anxiety, OA, plantar fasciitis. Her neurological  examination is essentially normal today. Patient's symptoms are consistent with migraine with aura with ~16 days of headache per month. There is likely a contribution from medication overuse as well. Of late, patient no longer has been on a preventative medication and her abortive medications (sumatriptan and rizatriptan) are no longer affective. I will restart a preventative medication today (Elavil by patient choice) and prescribe Nurtec as patient has failed 2 triptan medications.  PLAN: -Blood work: B12, vit D Migraine prevention:  Elavil 25 mg qhs Migraine rescue:  Continue rizatriptan for now. Will order and try to get Nurtec approved. Phenergan for nausea. Limit use of pain relievers to no more than 2 days out of week to prevent risk of rebound or medication-overuse headache. Keep headache diary Follow up in 2 months  The impression above as well as the plan as outlined below were extensively discussed with the patient who voiced understanding. All questions were answered to their satisfaction.  When available, results of the above investigations and possible further recommendations will be communicated to the patient via telephone/MyChart. Patient to call office if not contacted after expected testing turnaround time.   Total time spent reviewing records,  interview, history/exam, documentation, and coordination of care on day of encounter:  65 min   Thank you for allowing me to participate in patient's care.  If I can answer any additional questions, I would be pleased to do so.  Kai Levins, MD   CC: Allwardt, Randa Evens, PA-C [place] [id number]  CC: Referring provider: Allwardt, Randa Evens, PA-C [address] [place],  [place] [address]

## 2022-10-27 ENCOUNTER — Ambulatory Visit: Payer: Managed Care, Other (non HMO) | Admitting: Neurology

## 2022-10-27 ENCOUNTER — Other Ambulatory Visit (INDEPENDENT_AMBULATORY_CARE_PROVIDER_SITE_OTHER): Payer: Managed Care, Other (non HMO)

## 2022-10-27 ENCOUNTER — Encounter: Payer: Self-pay | Admitting: Neurology

## 2022-10-27 VITALS — BP 157/83 | HR 67 | Ht 71.0 in | Wt 274.0 lb

## 2022-10-27 DIAGNOSIS — G43E09 Chronic migraine with aura, not intractable, without status migrainosus: Secondary | ICD-10-CM | POA: Diagnosis not present

## 2022-10-27 DIAGNOSIS — R110 Nausea: Secondary | ICD-10-CM

## 2022-10-27 LAB — VITAMIN D 25 HYDROXY (VIT D DEFICIENCY, FRACTURES): VITD: 8.47 ng/mL — ABNORMAL LOW (ref 30.00–100.00)

## 2022-10-27 LAB — VITAMIN B12: Vitamin B-12: 392 pg/mL (ref 211–911)

## 2022-10-27 MED ORDER — AMITRIPTYLINE HCL 25 MG PO TABS: 25.0000 mg | ORAL_TABLET | Freq: Every day | ORAL | 11 refills | Status: DC

## 2022-10-27 MED ORDER — PROMETHAZINE HCL 12.5 MG PO TABS: 12.5000 mg | ORAL_TABLET | Freq: Four times a day (QID) | ORAL | 0 refills | Status: DC | PRN

## 2022-10-27 MED ORDER — NURTEC 75 MG PO TBDP: 75.0000 mg | ORAL_TABLET | Freq: Every day | ORAL | 3 refills | Status: DC | PRN

## 2022-10-27 NOTE — Patient Instructions (Signed)
Blood work today: B12, vit D  Migraine prevention:  Elavil 25 mg qhs Migraine rescue:  Phenergan for nausea. Continue rizatriptan as needed for now for acute headache. Will try to get Nurtec approved. Limit use of pain relievers to no more than 2 days out of week to prevent risk of rebound or medication-overuse headache. Keep headache diary Follow up in 2 months   The physicians and staff at [facility] are committed to providing excellent care. You may receive a survey requesting feedback about your experience at our office. We strive to receive "very good" responses to the survey questions. If you feel that your experience would prevent you from giving the office a "very good " response, please contact our office to try to remedy the situation. We may be reached at [phone]. Thank you for taking the time out of your busy day to complete the survey.  Kai Levins, MD [facility]

## 2022-10-31 ENCOUNTER — Telehealth: Payer: Self-pay | Admitting: Pharmacy Technician

## 2022-10-31 ENCOUNTER — Other Ambulatory Visit (HOSPITAL_COMMUNITY): Payer: Self-pay

## 2022-10-31 NOTE — Telephone Encounter (Signed)
Pharmacy Patient Advocate Encounter   Received notification from CoverMyMeds that prior authorization for Nurtec is required/requested.  PA submitted on 10/31/22 to [employer] via CoverMyMeds [facility]  PA Case ID: [id number]  Prior Authorization has been approved.    PA# [id number]; Date:10/31/2022;Coverage End Date:10/31/2023; Effective dates: 10/31/22 through 10/31/23  Per test claim, Patient's copay is $0.

## 2022-11-01 ENCOUNTER — Ambulatory Visit: Payer: Managed Care, Other (non HMO) | Admitting: Physician Assistant

## 2022-11-01 ENCOUNTER — Telehealth: Payer: Self-pay | Admitting: Physician Assistant

## 2022-11-01 VITALS — BP 148/88 | HR 100 | Temp 97.7°F | Ht 71.0 in | Wt 270.2 lb

## 2022-11-01 DIAGNOSIS — U071 COVID-19: Secondary | ICD-10-CM | POA: Diagnosis not present

## 2022-11-01 LAB — POC COVID19 BINAXNOW: SARS Coronavirus 2 Ag: POSITIVE — AB

## 2022-11-01 LAB — POCT INFLUENZA A/B
Influenza A, POC: NEGATIVE
Influenza B, POC: NEGATIVE

## 2022-11-01 MED ORDER — MOLNUPIRAVIR EUA 200MG CAPSULE: 4.0000 | ORAL_CAPSULE | Freq: Two times a day (BID) | ORAL | 0 refills | Status: AC

## 2022-11-01 NOTE — Telephone Encounter (Signed)
Pt scheduled with PCP 11/01/22  Patient Name: [name] Gender: Female DOB: December 06, 1971 Age: 51 Y 11 M 22 D Return Phone Number: [phone] (Primary) Address: City/ State/ Zip: [address] [place]  [address] Client [place] [place] Client Site [place] [facility] [employer] Type Call Who Is Calling Patient / Member / Family / Caregiver Call Type Triage / Clinical Relationship To Patient Self Return Phone Number [phone] (Primary) Chief Complaint Headache Reason for Call Request to Schedule Office Appointment Initial Comment Caller states she wants to schedule an appointment. Caller states she is having fever (100.2), cough, headache, congestion, vomiting. Caller has urinated in the last 8 hours, no blood in vomit. Translation No Nurse Assessment Nurse: Fredderick Phenix, RN, Lelan Pons Date/Time Eilene Ghazi Time): 11/01/2022 8:09:22 AM Confirm and document reason for call. If symptomatic, describe symptoms. ---Caller states she wants to schedule an appointment. Caller states she is having fever (100.2), cough, headache, congestion, vomiting. Caller has urinated in the last 8 hours, no blood in vomit. Started Monday. Does the patient have any new or worsening symptoms? ---Yes Will a triage be completed? ---Yes Related visit to physician within the last 2 weeks? ---No Does the PT have any chronic conditions? (i.e. diabetes, asthma, this includes High risk factors for pregnancy, etc.) ---No Is the patient pregnant or possibly pregnant? (Ask all females between the ages of 26-55) ---No Is this a behavioral health or substance abuse call? ---No Guidelines Guideline Title Affirmed Question Affirmed Notes Nurse Date/Time (Eastern Time) Cough - Acute Productive [1] MILD difficulty breathing (e.g., minimal/no SOB at rest, SOB with walking, pulse <100) Fredderick Phenix, RN, Lelan Pons 11/01/2022 8:11:05 AM  Guidelines Guideline Title  Affirmed Question Affirmed Notes Nurse Date/Time (Eastern Time) AND [2] still present when not coughing Disp. Time Eilene Ghazi Time) Disposition Final User 11/01/2022 8:15:18 AM See HCP within 4 Hours (or PCP triage) Yes Fredderick Phenix, RN, Lelan Pons Final Disposition 11/01/2022 8:15:18 AM See HCP within 4 Hours (or PCP triage) Yes Fredderick Phenix, RN, [place] Disagree/Comply Comply Caller Understands Yes PreDisposition Did not know what to do Care Advice Given Per Guideline SEE HCP (OR PCP TRIAGE) WITHIN 4 HOURS: * IF OFFICE WILL BE OPEN: You need to be seen within the next 3 or 4 hours. Call your doctor (or NP/PA) now or as soon as the office opens. CARE ADVICE given per Cough - Acute Productive (Adult) guideline. CALL BACK IF: * You become worse Referrals REFERRED TO PCP OFFICE

## 2022-11-01 NOTE — Telephone Encounter (Signed)
Pt scheduled for OV today.  

## 2022-11-01 NOTE — Progress Notes (Signed)
Subjective:    Patient ID: Shelly Larson, female    DOB: [birth year], 51 y.o.   MRN: [id number]  Chief Complaint  Patient presents with   Cough    Pt in office with cough and congestion, fever, and has not taken Covid test; Sunday morning with tickle in her throat, but by mid day Monday was really feeling poorly and running fever, fever broke but came back; symptoms yesterday was worse, congestion in chest and head, vomiting bile and mucus;     Cough   Patient is in today for acute sick symptoms. Symptom onset 2-3 days ago. Fever, cough, congestion, fatigue, body aches. No CP or SOB. Some nausea. No V or D. No abd pain. No sick contacts. Taking Tylenol to help keep fever down.   Past Medical History:  Diagnosis Date   ADHD    Allergies    Anemia    Arthritis    Asthma    Biliary acute pancreatitis 2010   Colon cancer ([place]) 2016   Transverse colon    Diabetes mellitus without complication ([place])    Dyspnea    GERD (gastroesophageal reflux disease)    Hypertension    Hypertension    Migraine     Past Surgical History:  Procedure Laterality Date   ABLATION     BAND HEMORRHOIDECTOMY     BIOPSY  09/02/2021   Procedure: BIOPSY;  Surgeon: Eloise Harman, DO;  [facility];  Service: Endoscopy;;   BREAST REDUCTION SURGERY Bilateral    CHOLECYSTECTOMY  2010   [place], biliary pancreatitis   COLECTOMY  09/30/2015   distal transverse d/t adenocarcinoma   COLONOSCOPY N/A 06/11/2018   Procedure: COLONOSCOPY;  Surgeon: Daneil Dolin, MD;  [facility];  Service: Endoscopy;  Laterality: N/A;  1:45pm   DILITATION & CURRETTAGE/HYSTROSCOPY WITH NOVASURE ABLATION N/A 09/10/2018   Procedure: DILATATION & CURETTAGE/HYSTEROSCOPY WITH MINERVA  ENDOMETRIAL ABLATION;  Surgeon: Jonnie Kind, MD;  Location: [facility];  Service: Gynecology;  Laterality: N/A;   ESOPHAGOGASTRODUODENOSCOPY (EGD) WITH PROPOFOL N/A 09/02/2021   Procedure: ESOPHAGOGASTRODUODENOSCOPY  (EGD) WITH PROPOFOL;  Surgeon: Eloise Harman, DO;  [facility];  Service: Endoscopy;  Laterality: N/A;   POLYPECTOMY  06/11/2018   Procedure: POLYPECTOMY;  Surgeon: Daneil Dolin, MD;  [facility];  Service: Endoscopy;;  colon    Family History  Problem Relation Age of Onset   Hypertension Mother    Diabetes Mellitus II Mother    Heart disease Sister        dx in late 29's   Breast cancer Paternal Grandmother    Breast cancer Paternal Aunt    Colon cancer Neg Hx    Liver disease Neg Hx     Social History   Tobacco Use   Smoking status: Never   Smokeless tobacco: Never  Vaping Use   Vaping Use: Never used  Substance Use Topics   Alcohol use: Yes    Comment: rare   Drug use: No     Allergies  Allergen Reactions   Lisinopril Cough    Review of Systems  Respiratory:  Positive for cough.    NEGATIVE UNLESS OTHERWISE INDICATED IN HPI      Objective:     BP (!) 148/88 (BP Location: Right Arm)   Pulse 100   Temp 97.7 F (36.5 C) (Temporal)   Ht '5\' 11"'$  (1.803 m)   Wt 270 lb 3.2 oz (122.6 kg)  SpO2 97%   BMI 37.69 kg/m   Wt Readings from Last 3 Encounters:  11/01/22 270 lb 3.2 oz (122.6 kg)  10/27/22 274 lb (124.3 kg)  10/09/22 271 lb 3.2 oz (123 kg)    BP Readings from Last 3 Encounters:  11/01/22 (!) 148/88  10/27/22 (!) 157/83  10/09/22 132/84     Physical Exam Vitals and nursing note reviewed.  Constitutional:      General: She is not in acute distress.    Appearance: Normal appearance. She is ill-appearing.  HENT:     Head: Normocephalic.     Right Ear: Tympanic membrane, ear canal and external ear normal.     Left Ear: Tympanic membrane, ear canal and external ear normal.     Nose: Congestion present.  Eyes:     Extraocular Movements: Extraocular movements intact.     Conjunctiva/sclera: Conjunctivae normal.     Pupils: Pupils are equal, round, and reactive to light.  Cardiovascular:     Rate and Rhythm:  Normal rate and regular rhythm.     Pulses: Normal pulses.     Heart sounds: Normal heart sounds. No murmur heard. Pulmonary:     Effort: Pulmonary effort is normal. No respiratory distress.     Breath sounds: Normal breath sounds. No wheezing or rhonchi.     Comments: Some cough Musculoskeletal:     Cervical back: Normal range of motion.  Skin:    General: Skin is warm.     Findings: No rash.  Neurological:     Mental Status: She is alert and oriented to person, place, and time.  Psychiatric:        Mood and Affect: Mood normal.        Behavior: Behavior normal.        Assessment & Plan:  COVID-19 -     POCT Influenza A/B -     POC COVID-19 BinaxNow  Other orders -     molnupiravir EUA; Take 4 capsules (800 mg total) by mouth 2 (two) times daily for 5 days.  Dispense: 40 capsule; Refill: 0    Diagnosis of COVID-19 confirmed via in-office test. Flu test negative.  Patient will benefit from antiviral therapy as she is with an 5-day window of symptom onset.  Plan to start on [place].  Risks versus benefits discussed.  Advised self-isolation at home for the next 5 days and then masking around others for at least an additional 5 days.  Treat supportively at this time including sleeping prone, deep breathing exercises, pushing fluids, walking every few hours, vitamins C and D, and Tylenol or ibuprofen as needed.  The patient understands that COVID-19 illness can wax and wane.  Should the symptoms acutely worsen or patient starts to experience sudden shortness of breath, chest pain, severe weakness, the patient will go straight to the emergency department.  Also advised home pulse oximetry monitoring and for any reading consistently under 92%, should also report to the emergency department.  The patient will continue to keep [place] updated.     Return if symptoms worsen or fail to improve.  This note was prepared with assistance of Systems analyst. Occasional  wrong-word or sound-a-like substitutions may have occurred due to the inherent limitations of voice recognition software.    Melchor Kirchgessner M Tanay Massiah, PA-C

## 2022-11-23 ENCOUNTER — Other Ambulatory Visit: Payer: Self-pay | Admitting: Physician Assistant

## 2022-11-27 ENCOUNTER — Other Ambulatory Visit: Payer: Self-pay | Admitting: Gastroenterology

## 2022-11-27 DIAGNOSIS — K219 Gastro-esophageal reflux disease without esophagitis: Secondary | ICD-10-CM

## 2022-11-28 ENCOUNTER — Other Ambulatory Visit (HOSPITAL_COMMUNITY): Payer: Self-pay

## 2022-12-02 NOTE — Progress Notes (Deleted)
GI Office Note    Referring Provider: Allwardt, Randa Evens, PA-C Primary Care Physician:  Allwardt, Randa Evens, PA-C Primary Gastroenterologist: Cristopher Estimable.Rourk, MD  Date:  12/02/2022  ID:  Shelly Larson, DOB 07/24/1971, MRN [id number]   Chief Complaint   No chief complaint on file.   History of Present Illness  Shelly Larson is a 51 y.o. female with a history of anemia, ADHD, biliary pancreatitis, diabetes, HTN, migraines, hepatitis B, colon cancer s/p colectomy in 2016, hemorrhoids s/p banding in October 2019 and constipation presenting today for follow-up.   Last colonoscopy 616/2019: -Grade 3 hemorrhoids, recommended daily -5 mm tubular adenoma sigmoid colon -External hemorrhoids  Hospitalization in 2022 for RUQ pain and elevated LFTs.  Found to have acute hepatitis B with positive hepatitis B surface antigen and hepatitis B IgM serology positive.  Liver biopsy demonstrated acute viral hepatitis.  EGD in October 2022: -H. pylori gastritis  Labs in August of this year with normal CBC, TSH, iron panel, ferritin, LFTs, and T. Bili.  Last office visit 09/04/2022.  Reported stomach cramps and pain with having a bowel movement as well as bloating.  Also with incomplete emptying, with daily small bowel movements.  Described various stool consistencies.  He reported improvement with Linzess in the past.  Recent hemorrhoid flare, uncontrolled Preparation H and Tucks pads.  Having nocturnal reflux symptoms.  Had some relief with over-the-counter omeprazole.  Given samples of Linzess 290 mcg, bouts helpful.  Prescription was sent in.  Advised omeprazole 40 mg daily, prior to dinner.  Anusol given, discussed considering repeat hemorrhoid banding.   Today: GERD -    Constipation -    Hemorrhoids -    Current Outpatient Medications  Medication Sig Dispense Refill   albuterol (VENTOLIN HFA) 108 (90 Base) MCG/ACT inhaler Inhale 2 puffs into the lungs every 4 (four) hours as needed for  wheezing or shortness of breath.      amitriptyline (ELAVIL) 25 MG tablet Take 1 tablet (25 mg total) by mouth at bedtime. 30 tablet 11   amLODipine (NORVASC) 5 MG tablet TAKE 1 TABLET BY MOUTH EVERY DAY (Patient taking differently: Take 5 mg by mouth every morning.) 30 tablet 2   amphetamine-dextroamphetamine (ADDERALL XR) 30 MG 24 hr capsule Take 1 capsule (30 mg total) by mouth daily. 30 capsule 0   amphetamine-dextroamphetamine (ADDERALL XR) 30 MG 24 hr capsule Take 1 capsule (30 mg total) by mouth every morning. (Patient taking differently: Take 30 mg by mouth every morning. As needed) 30 capsule 0   amphetamine-dextroamphetamine (ADDERALL XR) 30 MG 24 hr capsule Take 1 capsule (30 mg total) by mouth every morning. 30 capsule 0   [START ON 12/08/2022] amphetamine-dextroamphetamine (ADDERALL XR) 30 MG 24 hr capsule Take 1 capsule (30 mg total) by mouth every morning. 30 capsule 0   amphetamine-dextroamphetamine (ADDERALL) 15 MG tablet Take 1 tab po around 2 pm 30 tablet 0   amphetamine-dextroamphetamine (ADDERALL) 15 MG tablet Take 1 tab po around 2 pm (Patient taking differently: Take 1 tab po around 2 pm if needed) 30 tablet 0   [START ON 12/08/2022] amphetamine-dextroamphetamine (ADDERALL) 15 MG tablet Take 1 tab po around 2 pm 30 tablet 0   hydrocortisone (ANUSOL-HC) 2.5 % rectal cream Place 1 Application rectally 2 (two) times daily. 30 g 1   linaclotide (LINZESS) 290 MCG CAPS capsule Take 1 capsule (290 mcg total) by mouth daily before breakfast. 30 capsule 5   losartan-hydrochlorothiazide (HYZAAR) 100-25 MG tablet TAKE  1 TABLET BY MOUTH EVERY DAY 90 tablet 1   meloxicam (MOBIC) 7.5 MG tablet Take 15 mg by mouth daily.     metoprolol succinate (TOPROL-XL) 50 MG 24 hr tablet Take 50 mg by mouth every morning.   0   omeprazole (PRILOSEC) 40 MG capsule TAKE 1 CAPSULE (40 MG TOTAL) BY MOUTH DAILY. 30 capsule 1   promethazine (PHENERGAN) 12.5 MG tablet Take 1 tablet (12.5 mg total) by mouth  every 6 (six) hours as needed for nausea or vomiting. 30 tablet 0   Rimegepant Sulfate (NURTEC) 75 MG TBDP Take 75 mg by mouth daily as needed. 16 tablet 3   rizatriptan (MAXALT) 10 MG tablet TAKE 1 TABLET (10 MG TOTAL) BY MOUTH AS NEEDED FOR MIGRAINE. MAY REPEAT IN 2 HOURS IF NEEDED. DO NOT EXCEED 20 MG IN 24 HOURS. 10 tablet 0   No current facility-administered medications for this visit.    Past Medical History:  Diagnosis Date   ADHD    Allergies    Anemia    Arthritis    Asthma    Biliary acute pancreatitis 2010   Colon cancer ([place]) 2016   Transverse colon    Diabetes mellitus without complication ([place])    Dyspnea    GERD (gastroesophageal reflux disease)    Hypertension    Hypertension    Migraine     Past Surgical History:  Procedure Laterality Date   ABLATION     BAND HEMORRHOIDECTOMY     BIOPSY  09/02/2021   Procedure: BIOPSY;  Surgeon: Eloise Harman, DO;  [facility];  Service: Endoscopy;;   BREAST REDUCTION SURGERY Bilateral    CHOLECYSTECTOMY  2010   [place], biliary pancreatitis   COLECTOMY  09/30/2015   distal transverse d/t adenocarcinoma   COLONOSCOPY N/A 06/11/2018   Procedure: COLONOSCOPY;  Surgeon: Daneil Dolin, MD;  [facility];  Service: Endoscopy;  Laterality: N/A;  1:45pm   DILITATION & CURRETTAGE/HYSTROSCOPY WITH NOVASURE ABLATION N/A 09/10/2018   Procedure: DILATATION & CURETTAGE/HYSTEROSCOPY WITH MINERVA  ENDOMETRIAL ABLATION;  Surgeon: Jonnie Kind, MD;  Location[facility];  Service: Gynecology;  Laterality: N/A;   ESOPHAGOGASTRODUODENOSCOPY (EGD) WITH PROPOFOL N/A 09/02/2021   Procedure: ESOPHAGOGASTRODUODENOSCOPY (EGD) WITH PROPOFOL;  Surgeon: Eloise Harman, DO;  [facility];  Service: Endoscopy;  Laterality: N/A;   POLYPECTOMY  06/11/2018   Procedure: POLYPECTOMY;  Surgeon: Daneil Dolin, MD;  [facility];  Service: Endoscopy;;  colon    Family History  Problem Relation Age of  Onset   Hypertension Mother    Diabetes Mellitus II Mother    Heart disease Sister        dx in late 30's   Breast cancer Paternal Grandmother    Breast cancer Paternal Aunt    Colon cancer Neg Hx    Liver disease Neg Hx     Allergies as of 12/04/2022 - Review Complete 11/01/2022  Allergen Reaction Noted   Lisinopril Cough 05/08/2018    Social History   Socioeconomic History   Marital status: Single    Spouse name: Not on file   Number of children: Not on file   Years of education: Not on file   Highest education level: Not on file  Occupational History   Occupation: Public affairs consultant  Tobacco Use   Smoking status: Never   Smokeless tobacco: Never  Vaping Use   Vaping Use: Never used  Substance and Sexual Activity   Alcohol  use: Yes    Comment: rare   Drug use: No   Sexual activity: Yes    Birth control/protection: None, Surgical    Comment: ablation  Other Topics Concern   Not on file  Social History Narrative   Are you right handed or left handed? right   Are you currently employed ? 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 And niece   What type of home do you live in: 1 story or 2 story? One     Caffeine 1 soda a day   Social Determinants of Health   Financial Resource Strain: Not on file  Food Insecurity: Not on file  Transportation Needs: Not on file  Physical Activity: Not on file  Stress: Not on file  Social Connections: Not on file     Review of Systems   Gen: Denies fever, chills, anorexia. Denies fatigue, weakness, weight loss.  CV: Denies chest pain, palpitations, syncope, peripheral edema, and claudication. Resp: Denies dyspnea at rest, cough, wheezing, coughing up blood, and pleurisy. GI: See HPI Derm: Denies rash, itching, dry skin Psych: Denies depression, anxiety, memory loss, confusion. No homicidal or suicidal ideation.  Heme: Denies bruising, bleeding, and enlarged  lymph nodes.   Physical Exam   There were no vitals taken for this visit.  General:   Alert and oriented. No distress noted. Pleasant and cooperative.  Head:  Normocephalic and atraumatic. Eyes:  Conjuctiva clear without scleral icterus. Mouth:  Oral mucosa pink and moist. Good dentition. No lesions. Lungs:  Clear to auscultation bilaterally. No wheezes, rales, or rhonchi. No distress.  Heart:  S1, S2 present without murmurs appreciated.  Abdomen:  +BS, soft, non-tender and non-distended. No rebound or guarding. No HSM or masses noted. Rectal: *** Msk:  Symmetrical without gross deformities. Normal posture. Extremities:  Without edema. Neurologic:  Alert and  oriented x4 Psych:  Alert and cooperative. Normal mood and affect.   Assessment  Shelly Larson is a 51 y.o. female with a history of anemia, ADHD, biliary pancreatitis, diabetes, HTN, migraines, hepatitis B, colon cancer s/p colectomy in 2016, hemorrhoids s/p banding in October 2019 and constipation presenting today for follow-up.   Constipation:  GERD:  Hemorrhoids:   PLAN   ***     Venetia Night, MSN, FNP-BC, AGACNP-BC [facility]

## 2022-12-04 ENCOUNTER — Ambulatory Visit: Payer: Managed Care, Other (non HMO) | Admitting: Gastroenterology

## 2022-12-19 NOTE — Progress Notes (Deleted)
NEUROLOGY FOLLOW UP OFFICE NOTE  [name] [id number]  Subjective:  Shelly Larson is a 51 y.o. year old right-handed female with a medical history of migraine, HTN, DM, hep B, ADHD, colon cancer, anxiety, OA, plantar fasciitis who we last saw on 10/27/22.  To briefly review: Patient has had headaches for a lot of her life. They went away when she was in her 42s. About 4-5 years ago, she began having headaches again. She was on metoprolol, other BP meds, and topirimate for prevention. She was on sumatriptan for abortive therapy. This was working until recently. She was getting 3-4 headaches per month at that time. The sumatriptan would make her headaches tolerable. Patient stopped taking topirimate about 1 year ago.    Over the last 2-3 months, she has had worsening headaches. She is having more frequent headaches. PCP switched patient rizatriptan 10 mg PRN. This does not help much. She has not identified a clear trigger.    The headaches patient has now are similar to headaches she had when she was younger. Most of the time the pain is behind both eyes. She describes a "banging, piercing sensation." She endorses photophobia, phonophobia, nausea, and vomiting. Movement and activity makes it worse. She also has aura (flashing lights, vision changes, floaters, dizziness like sensation). The headaches last about 1-1.5 days before dying down to a tolerable nagging pain. At the worst, the pain is 9/10. She has about 6-8 headaches per month. She always gets at least 1 every week, maybe 2-3. There is no positional component per patient. Her headaches slowly build to maximal intensity.   Patient has been having night sweats for at least 6-7 months and hot flashes for about a year. Patient had an ablation 2-3 years ago. She occasionally has occasional spotting that she calls her fake cycle. She gets a migraine when this happens. She is not sleeping well, maybe sleeping 3-4 hours a night because of hot  flashes, sweats, or headaches. She is not hormonal therapy.   She takes OTC about 3 doses per week and triptan 1-2 times per week (total of 5 doses per week, or 20 total doses per month).   Patient occasionally goes to the ED for acute treatment, about 6 times in the last year, due to headache that she cannot control. She last went to the ED around 06/2022.   Past NSAIDS/analgesics:  tylenol, excedrin, ibuprofen Past abortive triptans:  sumatriptan (no longer helping) Past muscle relaxants:  flexeril Past anti-emetic:  phenergan Past antihypertensive medications:  propranolol, metoprolol Past antidepressant medications:  elavil (helped a lot) Past anticonvulsant medications:  topirimate (thinks it may have helped) Past vitamins/Herbal/Supplements:  none Past antihistamines/decongestants:  bendryl   Current NSAIDS/analgesics:  tylenol, excedrin, ibuprofen Current triptans:  rizatriptan (not helping) Current ergotamine:  none Current anti-emetic:  none Current muscle relaxants:  none Current Antihypertensive medications:  metoprolol Current Antidepressant medications:  none Current Anticonvulsant medications:  none Current anti-CGRP:  none Current Vitamins/Herbal/Supplements:  none Current Antihistamines/Decongestants:  bendryl Hormone/birth control:  none   Caffeine:  occasional Alcohol:  occasional wine Smoker:  no Diet:  normal american diet Exercise:  walks 3-4 times per week (about 1 mile); gets a lot of steps in daily Depression:  none; Anxiety:  gets panic attacks and anxiety (on adderall) Other pain:  gets a pain from the neck; getting worse of late Family history of headache:  sister had migraines  Most recent Assessment and Plan (10/27/22): Patient's symptoms are consistent  with migraine with aura with ~16 days of headache per month. There is likely a contribution from medication overuse as well. Of late, patient no longer has been on a preventative medication and her  abortive medications (sumatriptan and rizatriptan) are no longer affective. I will restart a preventative medication today (Elavil by patient choice) and prescribe Nurtec as patient has failed 2 triptan medications.   PLAN: -Blood work: B12, vit D Migraine prevention:  Elavil 25 mg qhs Migraine rescue:  Continue rizatriptan for now. Will order and try to get Nurtec approved. Phenergan for nausea. Limit use of pain relievers to no more than 2 days out of week to prevent risk of rebound or medication-overuse headache. Keep headache diary Follow up in 2 months  Since their last visit: Nurtec was approved on 10/31/22.  She was diagnosed with COVID on 11/01/22.***  Vit D was low at 8.47, so supplementation was recommended. *** ***  MEDICATIONS:  Outpatient Encounter Medications as of 12/28/2022  Medication Sig   albuterol (VENTOLIN HFA) 108 (90 Base) MCG/ACT inhaler Inhale 2 puffs into the lungs every 4 (four) hours as needed for wheezing or shortness of breath.    amitriptyline (ELAVIL) 25 MG tablet Take 1 tablet (25 mg total) by mouth at bedtime.   amLODipine (NORVASC) 5 MG tablet TAKE 1 TABLET BY MOUTH EVERY DAY (Patient taking differently: Take 5 mg by mouth every morning.)   amphetamine-dextroamphetamine (ADDERALL XR) 30 MG 24 hr capsule Take 1 capsule (30 mg total) by mouth daily.   amphetamine-dextroamphetamine (ADDERALL XR) 30 MG 24 hr capsule Take 1 capsule (30 mg total) by mouth every morning. (Patient taking differently: Take 30 mg by mouth every morning. As needed)   amphetamine-dextroamphetamine (ADDERALL XR) 30 MG 24 hr capsule Take 1 capsule (30 mg total) by mouth every morning.   amphetamine-dextroamphetamine (ADDERALL XR) 30 MG 24 hr capsule Take 1 capsule (30 mg total) by mouth every morning.   amphetamine-dextroamphetamine (ADDERALL) 15 MG tablet Take 1 tab po around 2 pm   amphetamine-dextroamphetamine (ADDERALL) 15 MG tablet Take 1 tab po around 2 pm (Patient taking  differently: Take 1 tab po around 2 pm if needed)   amphetamine-dextroamphetamine (ADDERALL) 15 MG tablet Take 1 tab po around 2 pm   hydrocortisone (ANUSOL-HC) 2.5 % rectal cream Place 1 Application rectally 2 (two) times daily.   linaclotide (LINZESS) 290 MCG CAPS capsule Take 1 capsule (290 mcg total) by mouth daily before breakfast.   losartan-hydrochlorothiazide (HYZAAR) 100-25 MG tablet TAKE 1 TABLET BY MOUTH EVERY DAY   meloxicam (MOBIC) 7.5 MG tablet Take 15 mg by mouth daily.   metoprolol succinate (TOPROL-XL) 50 MG 24 hr tablet Take 50 mg by mouth every morning.    omeprazole (PRILOSEC) 40 MG capsule TAKE 1 CAPSULE (40 MG TOTAL) BY MOUTH DAILY.   promethazine (PHENERGAN) 12.5 MG tablet Take 1 tablet (12.5 mg total) by mouth every 6 (six) hours as needed for nausea or vomiting.   Rimegepant Sulfate (NURTEC) 75 MG TBDP Take 75 mg by mouth daily as needed.   rizatriptan (MAXALT) 10 MG tablet TAKE 1 TABLET (10 MG TOTAL) BY MOUTH AS NEEDED FOR MIGRAINE. MAY REPEAT IN 2 HOURS IF NEEDED. DO NOT EXCEED 20 MG IN 24 HOURS.   No facility-administered encounter medications on file as of 12/28/2022.    PAST MEDICAL HISTORY: Past Medical History:  Diagnosis Date   ADHD    Allergies    Anemia    Arthritis    Asthma  Biliary acute pancreatitis 2010   Colon cancer ([place]) 2016   Transverse colon    Diabetes mellitus without complication ([place])    Dyspnea    GERD (gastroesophageal reflux disease)    Hypertension    Hypertension    Migraine     PAST SURGICAL HISTORY: Past Surgical History:  Procedure Laterality Date   ABLATION     BAND HEMORRHOIDECTOMY     BIOPSY  09/02/2021   Procedure: BIOPSY;  Surgeon: Eloise Harman, DO;  [facility];  Service: Endoscopy;;   BREAST REDUCTION SURGERY Bilateral    CHOLECYSTECTOMY  2010   [place], biliary pancreatitis   COLECTOMY  09/30/2015   distal transverse d/t adenocarcinoma   COLONOSCOPY N/A 06/11/2018   Procedure:  COLONOSCOPY;  Surgeon: Daneil Dolin, MD;  [facility];  Service: Endoscopy;  Laterality: N/A;  1:45pm   DILITATION & CURRETTAGE/HYSTROSCOPY WITH NOVASURE ABLATION N/A 09/10/2018   Procedure: DILATATION & CURETTAGE/HYSTEROSCOPY WITH MINERVA  ENDOMETRIAL ABLATION;  Surgeon: Jonnie Kind, MD;  Location: [facility];  Service: Gynecology;  Laterality: N/A;   ESOPHAGOGASTRODUODENOSCOPY (EGD) WITH PROPOFOL N/A 09/02/2021   Procedure: ESOPHAGOGASTRODUODENOSCOPY (EGD) WITH PROPOFOL;  Surgeon: Eloise Harman, DO;  [facility];  Service: Endoscopy;  Laterality: N/A;   POLYPECTOMY  06/11/2018   Procedure: POLYPECTOMY;  Surgeon: Daneil Dolin, MD;  [facility];  Service: Endoscopy;;  colon    ALLERGIES: Allergies  Allergen Reactions   Lisinopril Cough    FAMILY HISTORY: Family History  Problem Relation Age of Onset   Hypertension Mother    Diabetes Mellitus II Mother    Heart disease Sister        dx in late 4's   Breast cancer Paternal Grandmother    Breast cancer Paternal Aunt    Colon cancer Neg Hx    Liver disease Neg Hx     SOCIAL HISTORY: Social History   Tobacco Use   Smoking status: Never   Smokeless tobacco: Never  Vaping Use   Vaping Use: Never used  Substance Use Topics   Alcohol use: Yes    Comment: rare   Drug use: No   Social History   Social History Narrative   Are you right handed or left handed? right   Are you currently employed ? 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 And niece   What type of home do you live in: 1 story or 2 story? One     Caffeine 1 soda a day      Objective:  Vital Signs:  There were no vitals taken for this visit.  ***  Labs and Imaging review: New results: Vit D (10/27/22): 8.47 B12 (10/27/22): 392  Previously reviewed results: Internal labs: Recent Labs       Lab Results  Component Value Date    HGBA1C 6.3  08/17/2022      Recent Labs       Lab Results  Component Value Date    TSH 1.42 08/17/2022      Recent Labs'[]'$ Expand by Default       Lab Results  Component Value Date    ESRSEDRATE 25 (H) 08/31/2021      Normal or unremarkable: CBC, CMP   Iron studies: iron 81, TIBC 364, sat ratio 22.3, ferritin 39.7  Assessment/Plan:  This is [name], a 51 y.o. female with  migraines ***   Plan: Migraine prevention:  Elavil 25 mg qhs*** Migraine rescue:  Nurtec 75 mg PRN*** Limit use of pain relievers to no more than 2 days out of week to prevent risk of rebound or medication-overuse headache. Keep headache diary Follow up in *** months  Return to clinic in ***  Total time spent reviewing records, interview, history/exam, documentation, and coordination of care on day of encounter:  *** min  Kai Levins, MD

## 2022-12-28 ENCOUNTER — Encounter: Payer: Self-pay | Admitting: Neurology

## 2022-12-28 ENCOUNTER — Ambulatory Visit: Payer: Managed Care, Other (non HMO) | Admitting: Neurology

## 2022-12-28 DIAGNOSIS — Z029 Encounter for administrative examinations, unspecified: Secondary | ICD-10-CM

## 2023-01-09 ENCOUNTER — Ambulatory Visit: Payer: Managed Care, Other (non HMO) | Admitting: Physician Assistant

## 2023-01-09 VITALS — BP 148/96 | HR 88 | Temp 96.8°F | Ht 71.0 in | Wt 282.4 lb

## 2023-01-09 DIAGNOSIS — R7303 Prediabetes: Secondary | ICD-10-CM | POA: Diagnosis not present

## 2023-01-09 DIAGNOSIS — F909 Attention-deficit hyperactivity disorder, unspecified type: Secondary | ICD-10-CM | POA: Diagnosis not present

## 2023-01-09 DIAGNOSIS — F410 Panic disorder [episodic paroxysmal anxiety] without agoraphobia: Secondary | ICD-10-CM | POA: Diagnosis not present

## 2023-01-09 DIAGNOSIS — N959 Unspecified menopausal and perimenopausal disorder: Secondary | ICD-10-CM

## 2023-01-09 DIAGNOSIS — Z23 Encounter for immunization: Secondary | ICD-10-CM

## 2023-01-09 DIAGNOSIS — I10 Essential (primary) hypertension: Secondary | ICD-10-CM | POA: Diagnosis not present

## 2023-01-09 LAB — POCT GLYCOSYLATED HEMOGLOBIN (HGB A1C): Hemoglobin A1C: 6.3 % — AB (ref 4.0–5.6)

## 2023-01-09 MED ORDER — CLONAZEPAM 0.5 MG PO TABS: 0.5000 mg | ORAL_TABLET | Freq: Two times a day (BID) | ORAL | 1 refills | Status: DC | PRN

## 2023-01-09 MED ORDER — AMPHETAMINE-DEXTROAMPHET ER 30 MG PO CP24: 30.0000 mg | ORAL_CAPSULE | ORAL | 0 refills | Status: DC

## 2023-01-09 NOTE — Patient Instructions (Signed)
Please increase amlodipine to 7.5 mg daily; keep log of BP numbers, bring to next appointment

## 2023-01-09 NOTE — Progress Notes (Signed)
Subjective:    Patient ID: Shelly Larson, female    DOB: Aug 04, 1971, 52 y.o.   MRN: [id number]  Chief Complaint  Patient presents with   Follow-up    Pt in for follow up; pt is wanting to get a referral to move GYN to [facility] to work better with work/life balance and schedule; pt missed an appt due to Covid in the household; pt needing refills on medications today; pt thinks she could be going through pre menopause; night sweats, etc    HPI Patient is in today for recheck.  BP fluctuates often per patient, some days in 140s/90s, other days in 130s/80s. No symptoms with it. Hx of migraines, tends to keep a headache regularly.   Peri-menopausal symptoms - hot flashes, night sweats, difficulty losing weight  Needing adderall refilled. Doing well, lasts most of the day for her.  Still has random events where she will suddenly be very overstimulated, overwhelmed.  Patient gives me an example yesterday when her upstairs neighbor was practicing his clarinet and she suddenly became very overwhelmed and panicky, started to hear all the noises around her; had to shut herself in a dark closet and breathes through this.  Past Medical History:  Diagnosis Date   ADHD    Allergies    Anemia    Arthritis    Asthma    Biliary acute pancreatitis 2010   Colon cancer ([place]) 2016   Transverse colon    Diabetes mellitus without complication ([place])    Dyspnea    GERD (gastroesophageal reflux disease)    Hypertension    Hypertension    Migraine     Past Surgical History:  Procedure Laterality Date   ABLATION     BAND HEMORRHOIDECTOMY     BIOPSY  09/02/2021   Procedure: BIOPSY;  Surgeon: Eloise Harman, DO;  [facility];  Service: Endoscopy;;   BREAST REDUCTION SURGERY Bilateral    CHOLECYSTECTOMY  2010   [place], biliary pancreatitis   COLECTOMY  09/30/2015   distal transverse d/t adenocarcinoma   COLONOSCOPY N/A 06/11/2018   Procedure: COLONOSCOPY;  Surgeon: Daneil Dolin, MD;  [facility];  Service: Endoscopy;  Laterality: N/A;  1:45pm   DILITATION & CURRETTAGE/HYSTROSCOPY WITH NOVASURE ABLATION N/A 09/10/2018   Procedure: DILATATION & CURETTAGE/HYSTEROSCOPY WITH MINERVA  ENDOMETRIAL ABLATION;  Surgeon: Jonnie Kind, MD;  [facility];  Service: Gynecology;  Laterality: N/A;   ESOPHAGOGASTRODUODENOSCOPY (EGD) WITH PROPOFOL N/A 09/02/2021   Procedure: ESOPHAGOGASTRODUODENOSCOPY (EGD) WITH PROPOFOL;  Surgeon: Eloise Harman, DO;  [facility];  Service: Endoscopy;  Laterality: N/A;   POLYPECTOMY  06/11/2018   Procedure: POLYPECTOMY;  Surgeon: Daneil Dolin, MD;  [facility];  Service: Endoscopy;;  colon    Family History  Problem Relation Age of Onset   Hypertension Mother    Diabetes Mellitus II Mother    Heart disease Sister        dx in late 31's   Breast cancer Paternal Grandmother    Breast cancer Paternal Aunt    Colon cancer Neg Hx    Liver disease Neg Hx     Social History   Tobacco Use   Smoking status: Never   Smokeless tobacco: Never  Vaping Use   Vaping Use: Never used  Substance Use Topics   Alcohol use: Yes    Comment: rare   Drug use: No     Allergies  Allergen Reactions  Lisinopril Cough    Review of Systems NEGATIVE UNLESS OTHERWISE INDICATED IN HPI      Objective:     BP (!) 148/96 (BP Location: Left Arm)   Pulse 88   Temp (!) 96.8 F (36 C) (Temporal)   Ht '5\' 11"'$  (1.803 m)   Wt 282 lb 6.4 oz (128.1 kg)   SpO2 98%   BMI 39.39 kg/m   Wt Readings from Last 3 Encounters:  01/09/23 282 lb 6.4 oz (128.1 kg)  11/01/22 270 lb 3.2 oz (122.6 kg)  10/27/22 274 lb (124.3 kg)    BP Readings from Last 3 Encounters:  01/09/23 (!) 148/96  11/01/22 (!) 148/88  10/27/22 (!) 157/83     Physical Exam Vitals and nursing note reviewed.  Constitutional:      Appearance: Normal appearance.  Cardiovascular:     Rate and Rhythm: Normal rate and regular rhythm.      Pulses: Normal pulses.  Pulmonary:     Effort: Pulmonary effort is normal.     Breath sounds: Normal breath sounds.  Neurological:     General: No focal deficit present.     Mental Status: She is alert and oriented to person, place, and time.  Psychiatric:        Mood and Affect: Mood normal.        Behavior: Behavior normal.        Assessment & Plan:  Adult ADHD Assessment & Plan: PDMP reviewed today, no red flags, filling appropriately.  Patient is stable with Adderall XR 30 mg in the mornings, occasional use of Adderall IR 15 mg at 2 PM. No side effects or concerns with the medicine.   Essential hypertension Assessment & Plan: Blood pressure is still fluctuating, but seems to be trending higher.  Plan to increase amlodipine to 7.5 mg daily. Cont Losartan-HCTZ 100-25 mg Cont Toprol XL 50 mg Monitor at home Lifestyle changes encouraged   Morbid obesity ([place]) Assessment & Plan: She is ready to consider weight loss surgery.  Referral placed for her today.  Orders: -     Amb Referral to Bariatric Surgery  Panic disorder (episodic paroxysmal anxiety) Assessment & Plan: Intermittent sudden severe symptoms as described in her HPI.  Plan to give her prescription for Klonopin 0.5 mg to take up to twice daily only as needed. Pt aware of risks vs benefits and possible adverse reactions.  Also discussed possibility of abuse potential with this medication; cautioned her on use of this.  Patient understanding.   Prediabetes Assessment & Plan: Lab Results  Component Value Date   HGBA1C 6.3 (A) 01/09/2023   Weight loss will be the biggest help for patient.  She has tried numerous paths in the past without much success.  She is going to meet with bariatric surgery.  Encouraged her to keep working on lifestyle.  We may need to consider metformin for additional help.  Orders: -     Amb Referral to Bariatric Surgery -     POCT glycosylated hemoglobin (Hb A1C)  Menopausal and  perimenopausal disorder  Need for prophylactic vaccination with combined diphtheria-tetanus-pertussis (DTP) vaccine -     Tdap vaccine greater than or equal to 7yo IM  Other orders -     clonazePAM; Take 1 tablet (0.5 mg total) by mouth 2 (two) times daily as needed for anxiety.  Dispense: 20 tablet; Refill: 1 -     Amphetamine-Dextroamphet ER; Take 1 capsule (30 mg total) by mouth every morning.  Dispense:  30 capsule; Refill: 0 -     Amphetamine-Dextroamphet ER; Take 1 capsule (30 mg total) by mouth every morning.  Dispense: 30 capsule; Refill: 0 -     Amphetamine-Dextroamphet ER; Take 1 capsule (30 mg total) by mouth every morning.  Dispense: 30 capsule; Refill: 0        Return in about 4 weeks (around 02/06/2023) for BP recheck .  This note was prepared with assistance of Systems analyst. Occasional wrong-word or sound-a-like substitutions may have occurred due to the inherent limitations of voice recognition software.      Otelia Hettinger M Gail [place], PA-C

## 2023-01-10 NOTE — Assessment & Plan Note (Signed)
Blood pressure is still fluctuating, but seems to be trending higher.  Plan to increase amlodipine to 7.5 mg daily. Cont Losartan-HCTZ 100-25 mg Cont Toprol XL 50 mg Monitor at home Lifestyle changes encouraged

## 2023-01-10 NOTE — Assessment & Plan Note (Signed)
Intermittent sudden severe symptoms as described in her HPI.  Plan to give her prescription for Klonopin 0.5 mg to take up to twice daily only as needed. Pt aware of risks vs benefits and possible adverse reactions.  Also discussed possibility of abuse potential with this medication; cautioned her on use of this.  Patient understanding.

## 2023-01-10 NOTE — Assessment & Plan Note (Signed)
PDMP reviewed today, no red flags, filling appropriately.  Patient is stable with Adderall XR 30 mg in the mornings, occasional use of Adderall IR 15 mg at 2 PM. No side effects or concerns with the medicine.

## 2023-01-10 NOTE — Assessment & Plan Note (Signed)
She is ready to consider weight loss surgery.  Referral placed for her today.

## 2023-01-10 NOTE — Assessment & Plan Note (Signed)
Lab Results  Component Value Date   HGBA1C 6.3 (A) 01/09/2023   Weight loss will be the biggest help for patient.  She has tried numerous paths in the past without much success.  She is going to meet with bariatric surgery.  Encouraged her to keep working on lifestyle.  We may need to consider metformin for additional help.

## 2023-01-16 NOTE — Progress Notes (Deleted)
NEUROLOGY FOLLOW UP OFFICE NOTE  Shelly Larson [id number]  Subjective:  Shelly Larson is a 52 y.o. year old right-handed female with a medical history of migraine, HTN, DM, hep B, ADHD, colon cancer, anxiety, OA, plantar fasciitis who we last saw on 10/27/22.   To briefly review: Patient has had headaches for a lot of her life. They went away when she was in her 46s. About 4-5 years ago, she began having headaches again. She was on metoprolol, other BP meds, and topirimate for prevention. She was on sumatriptan for abortive therapy. This was working until recently. She was getting 3-4 headaches per month at that time. The sumatriptan would make her headaches tolerable. Patient stopped taking topirimate about 1 year ago.    Over the last 2-3 months, she has had worsening headaches. She is having more frequent headaches. PCP switched patient rizatriptan 10 mg PRN. This does not help much. She has not identified a clear trigger.    The headaches patient has now are similar to headaches she had when she was younger. Most of the time the pain is behind both eyes. She describes a "banging, piercing sensation." She endorses photophobia, phonophobia, nausea, and vomiting. Movement and activity makes it worse. She also has aura (flashing lights, vision changes, floaters, dizziness like sensation). The headaches last about 1-1.5 days before dying down to a tolerable nagging pain. At the worst, the pain is 9/10. She has about 6-8 headaches per month. She always gets at least 1 every week, maybe 2-3. There is no positional component per patient. Her headaches slowly build to maximal intensity.   Patient has been having night sweats for at least 6-7 months and hot flashes for about a year. Patient had an ablation 2-3 years ago. She occasionally has occasional spotting that she calls her fake cycle. She gets a migraine when this happens. She is not sleeping well, maybe sleeping 3-4 hours a night because of hot  flashes, sweats, or headaches. She is not hormonal therapy.   She takes OTC about 3 doses per week and triptan 1-2 times per week (total of 5 doses per week, or 20 total doses per month).   Patient occasionally goes to the ED for acute treatment, about 6 times in the last year, due to headache that she cannot control. She last went to the ED around 06/2022.   Past NSAIDS/analgesics:  tylenol, excedrin, ibuprofen Past abortive triptans:  sumatriptan (no longer helping) Past muscle relaxants:  flexeril Past anti-emetic:  phenergan Past antihypertensive medications:  propranolol, metoprolol Past antidepressant medications:  elavil (helped a lot) Past anticonvulsant medications:  topirimate (thinks it may have helped) Past vitamins/Herbal/Supplements:  none Past antihistamines/decongestants:  bendryl   Current NSAIDS/analgesics:  tylenol, excedrin, ibuprofen Current triptans:  rizatriptan (not helping) Current ergotamine:  none Current anti-emetic:  none Current muscle relaxants:  none Current Antihypertensive medications:  metoprolol Current Antidepressant medications:  none Current Anticonvulsant medications:  none Current anti-CGRP:  none Current Vitamins/Herbal/Supplements:  none Current Antihistamines/Decongestants:  bendryl Hormone/birth control:  none   Caffeine:  occasional Alcohol:  occasional wine Smoker:  no Diet:  normal american diet Exercise:  walks 3-4 times per week (about 1 mile); gets a lot of steps in daily Depression:  none; Anxiety:  gets panic attacks and anxiety (on adderall) Other pain:  gets a pain from the neck; getting worse of late Family history of headache:  sister had migraines   Most recent Assessment and Plan (10/27/22): Patient's symptoms  are consistent with migraine with aura with ~16 days of headache per month. There is likely a contribution from medication overuse as well. Of late, patient no longer has been on a preventative medication and her  abortive medications (sumatriptan and rizatriptan) are no longer affective. I will restart a preventative medication today (Elavil by patient choice) and prescribe Nurtec as patient has failed 2 triptan medications.   PLAN: -Blood work: B12, vit D Migraine prevention:  Elavil 25 mg qhs Migraine rescue:  Continue rizatriptan for now. Will order and try to get Nurtec approved. Phenergan for nausea. Limit use of pain relievers to no more than 2 days out of week to prevent risk of rebound or medication-overuse headache. Keep headache diary Follow up in 2 months   Since their last visit: Nurtec was approved on 10/31/22.   She was diagnosed with COVID on 11/01/22.***   Vit D was low at 8.47, so supplementation was recommended. *** ***  MEDICATIONS:  Outpatient Encounter Medications as of 01/17/2023  Medication Sig   albuterol (VENTOLIN HFA) 108 (90 Base) MCG/ACT inhaler Inhale 2 puffs into the lungs every 4 (four) hours as needed for wheezing or shortness of breath.    amitriptyline (ELAVIL) 25 MG tablet Take 1 tablet (25 mg total) by mouth at bedtime.   amLODipine (NORVASC) 5 MG tablet TAKE 1 TABLET BY MOUTH EVERY DAY (Patient taking differently: Take 5 mg by mouth every morning.)   amphetamine-dextroamphetamine (ADDERALL XR) 30 MG 24 hr capsule Take 1 capsule (30 mg total) by mouth daily.   amphetamine-dextroamphetamine (ADDERALL XR) 30 MG 24 hr capsule Take 1 capsule (30 mg total) by mouth every morning. (Patient taking differently: Take 30 mg by mouth every morning. As needed)   amphetamine-dextroamphetamine (ADDERALL XR) 30 MG 24 hr capsule Take 1 capsule (30 mg total) by mouth every morning.   amphetamine-dextroamphetamine (ADDERALL XR) 30 MG 24 hr capsule Take 1 capsule (30 mg total) by mouth every morning.   amphetamine-dextroamphetamine (ADDERALL XR) 30 MG 24 hr capsule Take 1 capsule (30 mg total) by mouth every morning.   [START ON 02/08/2023] amphetamine-dextroamphetamine (ADDERALL XR)  30 MG 24 hr capsule Take 1 capsule (30 mg total) by mouth every morning.   [START ON 03/10/2023] amphetamine-dextroamphetamine (ADDERALL XR) 30 MG 24 hr capsule Take 1 capsule (30 mg total) by mouth every morning.   amphetamine-dextroamphetamine (ADDERALL) 15 MG tablet Take 1 tab po around 2 pm   amphetamine-dextroamphetamine (ADDERALL) 15 MG tablet Take 1 tab po around 2 pm (Patient taking differently: Take 1 tab po around 2 pm if needed)   amphetamine-dextroamphetamine (ADDERALL) 15 MG tablet Take 1 tab po around 2 pm   clonazePAM (KLONOPIN) 0.5 MG tablet Take 1 tablet (0.5 mg total) by mouth 2 (two) times daily as needed for anxiety.   hydrocortisone (ANUSOL-HC) 2.5 % rectal cream Place 1 Application rectally 2 (two) times daily.   linaclotide (LINZESS) 290 MCG CAPS capsule Take 1 capsule (290 mcg total) by mouth daily before breakfast.   losartan-hydrochlorothiazide (HYZAAR) 100-25 MG tablet TAKE 1 TABLET BY MOUTH EVERY DAY   meloxicam (MOBIC) 7.5 MG tablet Take 15 mg by mouth daily.   metoprolol succinate (TOPROL-XL) 50 MG 24 hr tablet Take 50 mg by mouth every morning.    omeprazole (PRILOSEC) 40 MG capsule TAKE 1 CAPSULE (40 MG TOTAL) BY MOUTH DAILY.   promethazine (PHENERGAN) 12.5 MG tablet Take 1 tablet (12.5 mg total) by mouth every 6 (six) hours as needed for nausea  or vomiting.   Rimegepant Sulfate (NURTEC) 75 MG TBDP Take 75 mg by mouth daily as needed.   rizatriptan (MAXALT) 10 MG tablet TAKE 1 TABLET (10 MG TOTAL) BY MOUTH AS NEEDED FOR MIGRAINE. MAY REPEAT IN 2 HOURS IF NEEDED. DO NOT EXCEED 20 MG IN 24 HOURS.   No facility-administered encounter medications on file as of 01/17/2023.    PAST MEDICAL HISTORY: Past Medical History:  Diagnosis Date   ADHD    Allergies    Anemia    Arthritis    Asthma    Biliary acute pancreatitis 2010   Colon cancer ([place]) 2016   Transverse colon    Diabetes mellitus without complication ([place])    Dyspnea    GERD (gastroesophageal reflux  disease)    Hypertension    Hypertension    Migraine     PAST SURGICAL HISTORY: Past Surgical History:  Procedure Laterality Date   ABLATION     BAND HEMORRHOIDECTOMY     BIOPSY  09/02/2021   Procedure: BIOPSY;  Surgeon: Eloise Harman, DO;  [facility];  Service: Endoscopy;;   BREAST REDUCTION SURGERY Bilateral    CHOLECYSTECTOMY  2010   [place], biliary pancreatitis   COLECTOMY  09/30/2015   distal transverse d/t adenocarcinoma   COLONOSCOPY N/A 06/11/2018   Procedure: COLONOSCOPY;  Surgeon: Daneil Dolin, MD;  [facility];  Service: Endoscopy;  Laterality: N/A;  1:45pm   DILITATION & CURRETTAGE/HYSTROSCOPY WITH NOVASURE ABLATION N/A 09/10/2018   Procedure: DILATATION & CURETTAGE/HYSTEROSCOPY WITH MINERVA  ENDOMETRIAL ABLATION;  Surgeon: Jonnie Kind, MD;  Location: [facility];  Service: Gynecology;  Laterality: N/A;   ESOPHAGOGASTRODUODENOSCOPY (EGD) WITH PROPOFOL N/A 09/02/2021   Procedure: ESOPHAGOGASTRODUODENOSCOPY (EGD) WITH PROPOFOL;  Surgeon: Eloise Harman, DO;  [facility];  Service: Endoscopy;  Laterality: N/A;   POLYPECTOMY  06/11/2018   Procedure: POLYPECTOMY;  Surgeon: Daneil Dolin, MD;  [facility];  Service: Endoscopy;;  colon    ALLERGIES: Allergies  Allergen Reactions   Lisinopril Cough    FAMILY HISTORY: Family History  Problem Relation Age of Onset   Hypertension Mother    Diabetes Mellitus II Mother    Heart disease Sister        dx in late 43's   Breast cancer Paternal Grandmother    Breast cancer Paternal Aunt    Colon cancer Neg Hx    Liver disease Neg Hx     SOCIAL HISTORY: Social History   Tobacco Use   Smoking status: Never   Smokeless tobacco: Never  Vaping Use   Vaping Use: Never used  Substance Use Topics   Alcohol use: Yes    Comment: rare   Drug use: No   Social History   Social History Narrative   Are you right handed or left handed? right   Are you currently  employed ? 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 And niece   What type of home do you live in: 1 story or 2 story? One     Caffeine 1 soda a day      Objective:  Vital Signs:  There were no vitals taken for this visit.  ***  Labs and Imaging review: New results: Vit D (10/27/22): 8.47 B12 (10/27/22): 392   Previously reviewed results: Internal labs: Recent Labs  Lab Results  Component Value Date    HGBA1C 6.3 08/17/2022      Recent Labs           Lab Results  Component Value Date    TSH 1.42 08/17/2022      Recent Labs'[]'$ Expand by Default           Lab Results  Component Value Date    ESRSEDRATE 25 (H) 08/31/2021      Normal or unremarkable: CBC, CMP   Iron studies: iron 81, TIBC 364, sat ratio 22.3, ferritin 39.7  Assessment/Plan:  This is Shelly Larson, a 52 y.o. female with migraines***   Plan: Migraine prevention:  Elavil 25 mg qhs*** Migraine rescue:  Nurtec 75 mg PRN*** Cancel Maxalt in our system*** Limit use of pain relievers to no more than 2 days out of week to prevent risk of rebound or medication-overuse headache. Keep headache diary Follow up in *** months  Return to clinic in ***  Total time spent reviewing records, interview, history/exam, documentation, and coordination of care on day of encounter:  *** min  Kai Levins, MD

## 2023-01-17 ENCOUNTER — Ambulatory Visit: Payer: Managed Care, Other (non HMO) | Admitting: Neurology

## 2023-01-17 ENCOUNTER — Encounter: Payer: Self-pay | Admitting: Neurology

## 2023-01-17 DIAGNOSIS — Z029 Encounter for administrative examinations, unspecified: Secondary | ICD-10-CM

## 2023-01-17 NOTE — Progress Notes (Unsigned)
NEUROLOGY FOLLOW UP OFFICE NOTE  AMAZING COWMAN [id number]  Subjective:  Shelly Larson is a 52 y.o. year old right-handed female with a medical history of migraine, HTN, DM, hep B, ADHD, colon cancer, anxiety, OA, plantar fasciitis who we last saw on 10/27/22.    To briefly review: Patient has had headaches for a lot of her life. They went away when she was in her 86s. About 4-5 years ago, she began having headaches again. She was on metoprolol, other BP meds, and topirimate for prevention. She was on sumatriptan for abortive therapy. This was working until recently. She was getting 3-4 headaches per month at that time. The sumatriptan would make her headaches tolerable. Patient stopped taking topirimate about 1 year ago.    Over the last 2-3 months, she has had worsening headaches. She is having more frequent headaches. PCP switched patient rizatriptan 10 mg PRN. This does not help much. She has not identified a clear trigger.    The headaches patient has now are similar to headaches she had when she was younger. Most of the time the pain is behind both eyes. She describes a "banging, piercing sensation." She endorses photophobia, phonophobia, nausea, and vomiting. Movement and activity makes it worse. She also has aura (flashing lights, vision changes, floaters, dizziness like sensation). The headaches last about 1-1.5 days before dying down to a tolerable nagging pain. At the worst, the pain is 9/10. She has about 6-8 headaches per month. She always gets at least 1 every week, maybe 2-3. There is no positional component per patient. Her headaches slowly build to maximal intensity.   Patient has been having night sweats for at least 6-7 months and hot flashes for about a year. Patient had an ablation 2-3 years ago. She occasionally has occasional spotting that she calls her fake cycle. She gets a migraine when this happens. She is not sleeping well, maybe sleeping 3-4 hours a night because of hot  flashes, sweats, or headaches. She is not hormonal therapy.   She takes OTC about 3 doses per week and triptan 1-2 times per week (total of 5 doses per week, or 20 total doses per month).   Patient occasionally goes to the ED for acute treatment, about 6 times in the last year, due to headache that she cannot control. She last went to the ED around 06/2022.   Past NSAIDS/analgesics:  tylenol, excedrin, ibuprofen Past abortive triptans:  sumatriptan (no longer helping) Past muscle relaxants:  flexeril Past anti-emetic:  phenergan Past antihypertensive medications:  propranolol, metoprolol Past antidepressant medications:  elavil (helped a lot) Past anticonvulsant medications:  topirimate (thinks it may have helped) Past vitamins/Herbal/Supplements:  none Past antihistamines/decongestants:  bendryl   Current NSAIDS/analgesics:  tylenol, excedrin, ibuprofen Current triptans:  rizatriptan (not helping) Current ergotamine:  none Current anti-emetic:  none Current muscle relaxants:  none Current Antihypertensive medications:  metoprolol Current Antidepressant medications:  none Current Anticonvulsant medications:  none Current anti-CGRP:  none Current Vitamins/Herbal/Supplements:  none Current Antihistamines/Decongestants:  bendryl Hormone/birth control:  none   Caffeine:  occasional Alcohol:  occasional wine Smoker:  no Diet:  normal american diet Exercise:  walks 3-4 times per week (about 1 mile); gets a lot of steps in daily Depression:  none; Anxiety:  gets panic attacks and anxiety (on adderall) Other pain:  gets a pain from the neck; getting worse of late Family history of headache:  sister had migraines   Most recent Assessment and Plan (10/27/22): Patient's  symptoms are consistent with migraine with aura with ~16 days of headache per month. There is likely a contribution from medication overuse as well. Of late, patient no longer has been on a preventative medication and her  abortive medications (sumatriptan and rizatriptan) are no longer affective. I will restart a preventative medication today (Elavil by patient choice) and prescribe Nurtec as patient has failed 2 triptan medications.   PLAN: -Blood work: B12, vit D Migraine prevention:  Elavil 25 mg qhs Migraine rescue:  Continue rizatriptan for now. Will order and try to get Nurtec approved. Phenergan for nausea. Limit use of pain relievers to no more than 2 days out of week to prevent risk of rebound or medication-overuse headache. Keep headache diary Follow up in 2 months   Since their last visit: Nurtec was approved on 10/31/22.   She was diagnosed with COVID on 11/01/22.***   Vit D was low at 8.47, so supplementation was recommended. *** ***  MEDICATIONS:  Outpatient Encounter Medications as of 01/18/2023  Medication Sig   albuterol (VENTOLIN HFA) 108 (90 Base) MCG/ACT inhaler Inhale 2 puffs into the lungs every 4 (four) hours as needed for wheezing or shortness of breath.    amitriptyline (ELAVIL) 25 MG tablet Take 1 tablet (25 mg total) by mouth at bedtime.   amLODipine (NORVASC) 5 MG tablet TAKE 1 TABLET BY MOUTH EVERY DAY (Patient taking differently: Take 5 mg by mouth every morning.)   amphetamine-dextroamphetamine (ADDERALL XR) 30 MG 24 hr capsule Take 1 capsule (30 mg total) by mouth daily.   amphetamine-dextroamphetamine (ADDERALL XR) 30 MG 24 hr capsule Take 1 capsule (30 mg total) by mouth every morning. (Patient taking differently: Take 30 mg by mouth every morning. As needed)   amphetamine-dextroamphetamine (ADDERALL XR) 30 MG 24 hr capsule Take 1 capsule (30 mg total) by mouth every morning.   amphetamine-dextroamphetamine (ADDERALL XR) 30 MG 24 hr capsule Take 1 capsule (30 mg total) by mouth every morning.   amphetamine-dextroamphetamine (ADDERALL XR) 30 MG 24 hr capsule Take 1 capsule (30 mg total) by mouth every morning.   [START ON 02/08/2023] amphetamine-dextroamphetamine (ADDERALL XR)  30 MG 24 hr capsule Take 1 capsule (30 mg total) by mouth every morning.   [START ON 03/10/2023] amphetamine-dextroamphetamine (ADDERALL XR) 30 MG 24 hr capsule Take 1 capsule (30 mg total) by mouth every morning.   amphetamine-dextroamphetamine (ADDERALL) 15 MG tablet Take 1 tab po around 2 pm   amphetamine-dextroamphetamine (ADDERALL) 15 MG tablet Take 1 tab po around 2 pm (Patient taking differently: Take 1 tab po around 2 pm if needed)   amphetamine-dextroamphetamine (ADDERALL) 15 MG tablet Take 1 tab po around 2 pm   clonazePAM (KLONOPIN) 0.5 MG tablet Take 1 tablet (0.5 mg total) by mouth 2 (two) times daily as needed for anxiety.   hydrocortisone (ANUSOL-HC) 2.5 % rectal cream Place 1 Application rectally 2 (two) times daily.   linaclotide (LINZESS) 290 MCG CAPS capsule Take 1 capsule (290 mcg total) by mouth daily before breakfast.   losartan-hydrochlorothiazide (HYZAAR) 100-25 MG tablet TAKE 1 TABLET BY MOUTH EVERY DAY   meloxicam (MOBIC) 7.5 MG tablet Take 15 mg by mouth daily.   metoprolol succinate (TOPROL-XL) 50 MG 24 hr tablet Take 50 mg by mouth every morning.    omeprazole (PRILOSEC) 40 MG capsule TAKE 1 CAPSULE (40 MG TOTAL) BY MOUTH DAILY.   promethazine (PHENERGAN) 12.5 MG tablet Take 1 tablet (12.5 mg total) by mouth every 6 (six) hours as needed for  nausea or vomiting.   Rimegepant Sulfate (NURTEC) 75 MG TBDP Take 75 mg by mouth daily as needed.   rizatriptan (MAXALT) 10 MG tablet TAKE 1 TABLET (10 MG TOTAL) BY MOUTH AS NEEDED FOR MIGRAINE. MAY REPEAT IN 2 HOURS IF NEEDED. DO NOT EXCEED 20 MG IN 24 HOURS.   No facility-administered encounter medications on file as of 01/18/2023.    PAST MEDICAL HISTORY: Past Medical History:  Diagnosis Date   ADHD    Allergies    Anemia    Arthritis    Asthma    Biliary acute pancreatitis 2010   Colon cancer ([place]) 2016   Transverse colon    Diabetes mellitus without complication ([place])    Dyspnea    GERD (gastroesophageal reflux  disease)    Hypertension    Hypertension    Migraine     PAST SURGICAL HISTORY: Past Surgical History:  Procedure Laterality Date   ABLATION     BAND HEMORRHOIDECTOMY     BIOPSY  09/02/2021   Procedure: BIOPSY;  Surgeon: Eloise Harman, DO;  [facility];  Service: Endoscopy;;   BREAST REDUCTION SURGERY Bilateral    CHOLECYSTECTOMY  2010   [place], biliary pancreatitis   COLECTOMY  09/30/2015   distal transverse d/t adenocarcinoma   COLONOSCOPY N/A 06/11/2018   Procedure: COLONOSCOPY;  Surgeon: Daneil Dolin, MD;  [facility];  Service: Endoscopy;  Laterality: N/A;  1:45pm   DILITATION & CURRETTAGE/HYSTROSCOPY WITH NOVASURE ABLATION N/A 09/10/2018   Procedure: DILATATION & CURETTAGE/HYSTEROSCOPY WITH MINERVA  ENDOMETRIAL ABLATION;  Surgeon: Jonnie Kind, MD;  [facility];  Service: Gynecology;  Laterality: N/A;   ESOPHAGOGASTRODUODENOSCOPY (EGD) WITH PROPOFOL N/A 09/02/2021   Procedure: ESOPHAGOGASTRODUODENOSCOPY (EGD) WITH PROPOFOL;  Surgeon: Eloise Harman, DO;  [facility];  Service: Endoscopy;  Laterality: N/A;   POLYPECTOMY  06/11/2018   Procedure: POLYPECTOMY;  Surgeon: Daneil Dolin, MD;  [facility];  Service: Endoscopy;;  colon    ALLERGIES: Allergies  Allergen Reactions   Lisinopril Cough    FAMILY HISTORY: Family History  Problem Relation Age of Onset   Hypertension Mother    Diabetes Mellitus II Mother    Heart disease Sister        dx in late 75's   Breast cancer Paternal Grandmother    Breast cancer Paternal Aunt    Colon cancer Neg Hx    Liver disease Neg Hx     SOCIAL HISTORY: Social History   Tobacco Use   Smoking status: Never   Smokeless tobacco: Never  Vaping Use   Vaping Use: Never used  Substance Use Topics   Alcohol use: Yes    Comment: rare   Drug use: No   Social History   Social History Narrative   Are you right handed or left handed? right   Are you currently  employed ? 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 And niece   What type of home do you live in: 1 story or 2 story? One     Caffeine 1 soda a day      Objective:  Vital Signs:  There were no vitals taken for this visit.  ***  Labs and Imaging review: New results: Vit D (10/27/22): 8.47 B12 (10/27/22): 392   Previously reviewed results: Internal labs: Recent Labs  Lab Results  Component Value Date    HGBA1C 6.3 08/17/2022      Recent Labs           Lab Results  Component Value Date    TSH 1.42 08/17/2022      Recent Labs'[]'$ Expand by Default           Lab Results  Component Value Date    ESRSEDRATE 25 (H) 08/31/2021      Normal or unremarkable: CBC, CMP   Iron studies: iron 81, TIBC 364, sat ratio 22.3, ferritin 39.7  Assessment/Plan:  This is Shelly Larson, a 52 y.o. female with migraines***   Plan: Migraine prevention:  Elavil 25 mg qhs*** Migraine rescue:  Nurtec 75 mg PRN*** Cancel Maxalt in our system*** Limit use of pain relievers to no more than 2 days out of week to prevent risk of rebound or medication-overuse headache. Keep headache diary Follow up in *** months  Return to clinic in ***  Total time spent reviewing records, interview, history/exam, documentation, and coordination of care on day of encounter:  *** min  Kai Levins, MD

## 2023-01-18 ENCOUNTER — Encounter: Payer: Self-pay | Admitting: Neurology

## 2023-01-18 ENCOUNTER — Ambulatory Visit: Payer: Managed Care, Other (non HMO) | Admitting: Neurology

## 2023-01-18 VITALS — BP 151/93 | HR 92 | Ht 71.0 in | Wt 280.0 lb

## 2023-01-18 DIAGNOSIS — M542 Cervicalgia: Secondary | ICD-10-CM

## 2023-01-18 DIAGNOSIS — E559 Vitamin D deficiency, unspecified: Secondary | ICD-10-CM | POA: Diagnosis not present

## 2023-01-18 DIAGNOSIS — G43E09 Chronic migraine with aura, not intractable, without status migrainosus: Secondary | ICD-10-CM | POA: Diagnosis not present

## 2023-01-18 DIAGNOSIS — R29898 Other symptoms and signs involving the musculoskeletal system: Secondary | ICD-10-CM

## 2023-01-18 MED ORDER — AMITRIPTYLINE HCL 25 MG PO TABS: 50.0000 mg | ORAL_TABLET | Freq: Every day | ORAL | 11 refills | Status: DC

## 2023-01-18 NOTE — Patient Instructions (Signed)
Plan: Migraine prevention:  Increase Elavil to 50 mg (2 capsules) at bedtime - new prescription was sent to your pharmacy Migraine rescue:  Nurtec 75 mg as needed. Should be at your pharmacy. Call us if not. Limit use of pain relievers to no more than 2 days out of week to prevent risk of rebound or medication-overuse headache. Physical therapy for neck pain Keep headache diary Continue vit D 1000 IU daily Follow up in 1 month  Let me know if you are not getting adequate relief from your headaches.  The physicians and staff at [facility] are committed to providing excellent care. You may receive a survey requesting feedback about your experience at our office. We strive to receive "very good" responses to the survey questions. If you feel that your experience would prevent you from giving the office a "very good " response, please contact our office to try to remedy the situation. We may be reached at [phone]. Thank you for taking the time out of your busy day to complete the survey.  Kai Levins, MD [facility]

## 2023-01-29 ENCOUNTER — Ambulatory Visit: Payer: Managed Care, Other (non HMO) | Admitting: Physician Assistant

## 2023-02-01 ENCOUNTER — Ambulatory Visit: Payer: Managed Care, Other (non HMO) | Admitting: Physical Therapy

## 2023-02-01 ENCOUNTER — Encounter: Payer: Self-pay | Admitting: Physician Assistant

## 2023-02-01 ENCOUNTER — Ambulatory Visit: Payer: Managed Care, Other (non HMO) | Admitting: Physician Assistant

## 2023-02-01 VITALS — BP 126/80 | HR 64 | Temp 97.5°F | Ht 71.0 in | Wt 284.5 lb

## 2023-02-01 DIAGNOSIS — H6123 Impacted cerumen, bilateral: Secondary | ICD-10-CM | POA: Diagnosis not present

## 2023-02-01 NOTE — Progress Notes (Signed)
Shelly Larson is a 52 y.o. female here for a new problem.  History of Present Illness:   Chief Complaint  Patient presents with   ear pressure    Pt c/o feeling like ears are jammed. Had wax removed from ears few weeks ago.    HPI  Ear issues Patient reports ear fullness sudden onset yesterday. She had her ears cleaned in October per chart review. She feels like she cannot hear well. Denies any pain   Past Medical History:  Diagnosis Date   ADHD    Allergies    Anemia    Arthritis    Asthma    Biliary acute pancreatitis 2010   Colon cancer ([place]) 2016   Transverse colon    Diabetes mellitus without complication (HCC)    Dyspnea    GERD (gastroesophageal reflux disease)    Hypertension    Hypertension    Migraine      Social History   Tobacco Use   Smoking status: Never   Smokeless tobacco: Never  Vaping Use   Vaping Use: Never used  Substance Use Topics   Alcohol use: Yes    Comment: rare   Drug use: No    Past Surgical History:  Procedure Laterality Date   ABLATION     BAND HEMORRHOIDECTOMY     BIOPSY  09/02/2021   Procedure: BIOPSY;  Surgeon: Eloise Harman, DO;  [facility];  Service: Endoscopy;;   BREAST REDUCTION SURGERY Bilateral    CHOLECYSTECTOMY  2010   [place], biliary pancreatitis   COLECTOMY  09/30/2015   distal transverse d/t adenocarcinoma   COLONOSCOPY N/A 06/11/2018   Procedure: COLONOSCOPY;  Surgeon: Daneil Dolin, MD;  [facility];  Service: Endoscopy;  Laterality: N/A;  1:45pm   DILITATION & CURRETTAGE/HYSTROSCOPY WITH NOVASURE ABLATION N/A 09/10/2018   Procedure: DILATATION & CURETTAGE/HYSTEROSCOPY WITH MINERVA  ENDOMETRIAL ABLATION;  Surgeon: Jonnie Kind, MD;  Location: [facility];  Service: Gynecology;  Laterality: N/A;   ESOPHAGOGASTRODUODENOSCOPY (EGD) WITH PROPOFOL N/A 09/02/2021   Procedure: ESOPHAGOGASTRODUODENOSCOPY (EGD) WITH PROPOFOL;  Surgeon: Eloise Harman, DO;  [facility];  Service: Endoscopy;  Laterality: N/A;   POLYPECTOMY  06/11/2018   Procedure: POLYPECTOMY;  Surgeon: Daneil Dolin, MD;  [facility];  Service: Endoscopy;;  colon    Family History  Problem Relation Age of Onset   Hypertension Mother    Diabetes Mellitus II Mother    Heart disease Sister        dx in late 85's   Breast cancer Paternal Grandmother    Breast cancer Paternal Aunt    Colon cancer Neg Hx    Liver disease Neg Hx     Allergies  Allergen Reactions   Lisinopril Cough    Current Medications:   Current Outpatient Medications:    albuterol (VENTOLIN HFA) 108 (90 Base) MCG/ACT inhaler, Inhale 2 puffs into the lungs every 4 (four) hours as needed for wheezing or shortness of breath. , Disp: , Rfl:    amitriptyline (ELAVIL) 25 MG tablet, Take 2 tablets (50 mg total) by mouth at bedtime., Disp: 60 tablet, Rfl: 11   amLODipine (NORVASC) 5 MG tablet, TAKE 1 TABLET BY MOUTH EVERY DAY (Patient taking differently: Take 7.5 mg by mouth every morning.), Disp: 30 tablet, Rfl: 2   amphetamine-dextroamphetamine (ADDERALL XR) 30 MG 24 hr capsule, Take 1 capsule (30 mg total) by mouth daily., Disp: 30 capsule, Rfl: 0  amphetamine-dextroamphetamine (ADDERALL XR) 30 MG 24 hr capsule, Take 1 capsule (30 mg total) by mouth every morning., Disp: 30 capsule, Rfl: 0   [START ON 02/08/2023] amphetamine-dextroamphetamine (ADDERALL XR) 30 MG 24 hr capsule, Take 1 capsule (30 mg total) by mouth every morning., Disp: 30 capsule, Rfl: 0   [START ON 03/10/2023] amphetamine-dextroamphetamine (ADDERALL XR) 30 MG 24 hr capsule, Take 1 capsule (30 mg total) by mouth every morning., Disp: 30 capsule, Rfl: 0   amphetamine-dextroamphetamine (ADDERALL) 15 MG tablet, Take 1 tab po around 2 pm, Disp: 30 tablet, Rfl: 0   amphetamine-dextroamphetamine (ADDERALL) 15 MG tablet, Take 1 tab po around 2 pm, Disp: 30 tablet, Rfl: 0   amphetamine-dextroamphetamine (ADDERALL) 15 MG tablet, Take 1 tab po  around 2 pm, Disp: 30 tablet, Rfl: 0   clonazePAM (KLONOPIN) 0.5 MG tablet, Take 1 tablet (0.5 mg total) by mouth 2 (two) times daily as needed for anxiety., Disp: 20 tablet, Rfl: 1   hydrocortisone (ANUSOL-HC) 2.5 % rectal cream, Place 1 Application rectally 2 (two) times daily., Disp: 30 g, Rfl: 1   linaclotide (LINZESS) 290 MCG CAPS capsule, Take 1 capsule (290 mcg total) by mouth daily before breakfast., Disp: 30 capsule, Rfl: 5   losartan-hydrochlorothiazide (HYZAAR) 100-25 MG tablet, TAKE 1 TABLET BY MOUTH EVERY DAY, Disp: 90 tablet, Rfl: 1   metoprolol succinate (TOPROL-XL) 50 MG 24 hr tablet, Take 50 mg by mouth every morning. , Disp: , Rfl: 0   omeprazole (PRILOSEC) 40 MG capsule, TAKE 1 CAPSULE (40 MG TOTAL) BY MOUTH DAILY., Disp: 30 capsule, Rfl: 1   promethazine (PHENERGAN) 12.5 MG tablet, Take 1 tablet (12.5 mg total) by mouth every 6 (six) hours as needed for nausea or vomiting., Disp: 30 tablet, Rfl: 0   Rimegepant Sulfate (NURTEC) 75 MG TBDP, Take 75 mg by mouth daily as needed., Disp: 16 tablet, Rfl: 3   amphetamine-dextroamphetamine (ADDERALL XR) 30 MG 24 hr capsule, Take 1 capsule (30 mg total) by mouth every morning. (Patient taking differently: Take 30 mg by mouth every morning. As needed), Disp: 30 capsule, Rfl: 0   amphetamine-dextroamphetamine (ADDERALL XR) 30 MG 24 hr capsule, Take 1 capsule (30 mg total) by mouth every morning., Disp: 30 capsule, Rfl: 0   amphetamine-dextroamphetamine (ADDERALL XR) 30 MG 24 hr capsule, Take 1 capsule (30 mg total) by mouth every morning., Disp: 30 capsule, Rfl: 0   Review of Systems:   ROS Negative unless otherwise specified per HPI.  Vitals:   Vitals:   02/01/23 1136  BP: 126/80  Pulse: 64  Temp: (!) 97.5 F (36.4 C)  TempSrc: Temporal  SpO2: 99%  Weight: 284 lb 8 oz (129 kg)  Height: '5\' 11"'$  (1.803 m)     Body mass index is 39.68 kg/m.  Physical Exam:   Physical Exam Constitutional:      Appearance: Normal appearance.  She is well-developed.  HENT:     Head: Normocephalic and atraumatic.     Right Ear: There is impacted cerumen.     Left Ear: There is impacted cerumen.  Eyes:     General: Lids are normal.     Extraocular Movements: Extraocular movements intact.     Conjunctiva/sclera: Conjunctivae normal.  Pulmonary:     Effort: Pulmonary effort is normal.  Musculoskeletal:        General: Normal range of motion.     Cervical back: Normal range of motion and neck supple.  Skin:    General: Skin is warm and  dry.  Neurological:     Mental Status: She is alert and oriented to person, place, and time.  Psychiatric:        Attention and Perception: Attention and perception normal.        Mood and Affect: Mood normal.        Behavior: Behavior normal.        Thought Content: Thought content normal.        Judgment: Judgment normal.    Ceruminosis is noted.  Wax removal attempted by syringing and manual debridement. Instructions for home care to prevent wax buildup are given.  Assessment and Plan:   Bilateral impacted cerumen Unable to completely irrigate ears Pt recommended to use Debrox at home If still no relief, we can try in office again vs ENT referral  I,Rachel Rivera,acting as a scribe for Inda Coke, PA.,have documented all relevant documentation on the behalf of Inda Coke, PA,as directed by  Inda Coke, PA while in the presence of Inda Coke, [place].  I, Inda Coke, [place], have reviewed all documentation for this visit. The documentation on 02/01/23 for the exam, diagnosis, procedures, and orders are all accurate and complete.  Inda Coke, PA-C

## 2023-02-01 NOTE — Patient Instructions (Addendum)
   If no success in one week, please return for another ear lavage.

## 2023-02-05 ENCOUNTER — Ambulatory Visit: Admission: EM | Admit: 2023-02-05 | Discharge status: Absent without leave | Payer: Managed Care, Other (non HMO)

## 2023-02-05 ENCOUNTER — Ambulatory Visit: Payer: Managed Care, Other (non HMO) | Admitting: Family

## 2023-02-07 ENCOUNTER — Ambulatory Visit (INDEPENDENT_AMBULATORY_CARE_PROVIDER_SITE_OTHER): Payer: Managed Care, Other (non HMO) | Admitting: Physician Assistant

## 2023-02-07 ENCOUNTER — Ambulatory Visit: Payer: Managed Care, Other (non HMO) | Admitting: Physician Assistant

## 2023-02-07 ENCOUNTER — Encounter: Payer: Self-pay | Admitting: Physician Assistant

## 2023-02-07 VITALS — BP 146/88 | HR 75 | Temp 97.3°F | Ht 71.0 in | Wt 281.0 lb

## 2023-02-07 DIAGNOSIS — I1A0 Resistant hypertension: Secondary | ICD-10-CM | POA: Diagnosis not present

## 2023-02-07 DIAGNOSIS — I10 Essential (primary) hypertension: Secondary | ICD-10-CM

## 2023-02-07 DIAGNOSIS — H60512 Acute actinic otitis externa, left ear: Secondary | ICD-10-CM | POA: Diagnosis not present

## 2023-02-07 MED ORDER — AMLODIPINE BESYLATE 10 MG PO TABS: 10.0000 mg | ORAL_TABLET | Freq: Every day | ORAL | 2 refills | Status: DC

## 2023-02-07 NOTE — Progress Notes (Signed)
Subjective:    Patient ID: Shelly Larson, female    DOB: 1971/10/24, 52 y.o.   MRN: [id number]  Chief Complaint  Patient presents with   Hypertension    Pt in the office for follow up on BP; pt has been having recent ear pain and was seen in Urgent Care; Left ear was so swollen MD couldn't see down in the ear, prescribed antibiotics and drops but not so sure the drops are getting down in the ear. Pt still in pain.     HPI Patient is in today for blood pressure recheck 199/99 - BP reading two days ago while at ED for Acute L OE. Wants to recheck this ear is healing correctly also.   Past Medical History:  Diagnosis Date   ADHD    Allergies    Anemia    Arthritis    Asthma    Biliary acute pancreatitis 2010   Colon cancer ([place]) 2016   Transverse colon    Diabetes mellitus without complication ([place])    Dyspnea    GERD (gastroesophageal reflux disease)    Hypertension    Hypertension    Migraine     Past Surgical History:  Procedure Laterality Date   ABLATION     BAND HEMORRHOIDECTOMY     BIOPSY  09/02/2021   Procedure: BIOPSY;  Surgeon: Eloise Harman, DO;  [facility];  Service: Endoscopy;;   BREAST REDUCTION SURGERY Bilateral    CHOLECYSTECTOMY  2010   [place], biliary pancreatitis   COLECTOMY  09/30/2015   distal transverse d/t adenocarcinoma   COLONOSCOPY N/A 06/11/2018   Procedure: COLONOSCOPY;  Surgeon: Daneil Dolin, MD;  [facility];  Service: Endoscopy;  Laterality: N/A;  1:45pm   DILITATION & CURRETTAGE/HYSTROSCOPY WITH NOVASURE ABLATION N/A 09/10/2018   Procedure: DILATATION & CURETTAGE/HYSTEROSCOPY WITH MINERVA  ENDOMETRIAL ABLATION;  Surgeon: Jonnie Kind, MD;  [facility];  Service: Gynecology;  Laterality: N/A;   ESOPHAGOGASTRODUODENOSCOPY (EGD) WITH PROPOFOL N/A 09/02/2021   Procedure: ESOPHAGOGASTRODUODENOSCOPY (EGD) WITH PROPOFOL;  Surgeon: Eloise Harman, DO;  [facility];  Service: Endoscopy;   Laterality: N/A;   POLYPECTOMY  06/11/2018   Procedure: POLYPECTOMY;  Surgeon: Daneil Dolin, MD;  [facility];  Service: Endoscopy;;  colon    Family History  Problem Relation Age of Onset   Hypertension Mother    Diabetes Mellitus II Mother    Heart disease Sister        dx in late 58's   Breast cancer Paternal Grandmother    Breast cancer Paternal Aunt    Colon cancer Neg Hx    Liver disease Neg Hx     Social History   Tobacco Use   Smoking status: Never   Smokeless tobacco: Never  Vaping Use   Vaping Use: Never used  Substance Use Topics   Alcohol use: Yes    Comment: rare   Drug use: No     Allergies  Allergen Reactions   Lisinopril Cough    Review of Systems NEGATIVE UNLESS OTHERWISE INDICATED IN HPI      Objective:     BP (!) 146/88 (BP Location: Left Arm)   Pulse 75   Temp (!) 97.3 F (36.3 C) (Temporal)   Ht 5' 11"$  (1.803 m)   Wt 281 lb (127.5 kg)   SpO2 96%   BMI 39.19 kg/m   Wt Readings from Last 3 Encounters:  02/07/23 281 lb (127.5  kg)  02/01/23 284 lb 8 oz (129 kg)  01/18/23 280 lb (127 kg)    BP Readings from Last 3 Encounters:  02/07/23 (!) 146/88  02/01/23 126/80  01/18/23 (!) 151/93     Physical Exam Vitals and nursing note reviewed.  Constitutional:      Appearance: Normal appearance. She is obese.  HENT:     Right Ear: Tympanic membrane, ear canal and external ear normal. There is no impacted cerumen.     Left Ear: Drainage (debris noted) present. No swelling or tenderness.  No middle ear effusion. There is no impacted cerumen. No foreign body. No mastoid tenderness.  Cardiovascular:     Rate and Rhythm: Normal rate and regular rhythm.  Pulmonary:     Effort: Pulmonary effort is normal.     Breath sounds: Normal breath sounds.  Neurological:     Mental Status: She is alert.        Assessment & Plan:  Resistant hypertension -     Ambulatory referral to Cardiology  Acute actinic otitis externa of  left ear  Essential hypertension Assessment & Plan: Blood pressure is still fluctuating, but seems to be trending higher.  Plan to increase amlodipine to 10 mg daily. Cont Losartan-HCTZ 100-25 mg Cont Toprol XL 50 mg Monitor at home Lifestyle changes encouraged Referral to cardiology for help with resistant HTN     Other orders -     amLODIPine Besylate; Take 1 tablet (10 mg total) by mouth daily.  Dispense: 30 tablet; Refill: 2    Reassured patient that L OE is improving - continue the ciprodex otic drops & amoxicillin as discussed    Return in about 3 months (around 05/08/2023) for med check .    Davion Meara M Mikia Delaluz, PA-C

## 2023-02-09 NOTE — Progress Notes (Deleted)
NEUROLOGY FOLLOW UP OFFICE NOTE  HE[facility] SCHREITER [id number]  Subjective:  Shelly Larson is a 52 y.o. year old right-handed female with a medical history of migraine, HTN, DM, hep B, ADHD, colon cancer, anxiety, OA, plantar fasciitis who we last saw on 01/18/23.  To briefly review: Patient has had headaches for a lot of her life. They went away when she was in her 22s. About 4-5 years ago, she began having headaches again. She was on metoprolol, other BP meds, and topirimate for prevention. She was on sumatriptan for abortive therapy. This was working until recently. She was getting 3-4 headaches per month at that time. The sumatriptan would make her headaches tolerable. Patient stopped taking topirimate about 1 year ago.    Over the last 2-3 months, she has had worsening headaches. She is having more frequent headaches. PCP switched patient rizatriptan 10 mg PRN. This does not help much. She has not identified a clear trigger.    The headaches patient has now are similar to headaches she had when she was younger. Most of the time the pain is behind both eyes. She describes a "banging, piercing sensation." She endorses photophobia, phonophobia, nausea, and vomiting. Movement and activity makes it worse. She also has aura (flashing lights, vision changes, floaters, dizziness like sensation). The headaches last about 1-1.5 days before dying down to a tolerable nagging pain. At the worst, the pain is 9/10. She has about 6-8 headaches per month. She always gets at least 1 every week, maybe 2-3. There is no positional component per patient. Her headaches slowly build to maximal intensity.   Patient has been having night sweats for at least 6-7 months and hot flashes for about a year. Patient had an ablation 2-3 years ago. She occasionally has occasional spotting that she calls her fake cycle. She gets a migraine when this happens. She is not sleeping well, maybe sleeping 3-4 hours a night because of hot  flashes, sweats, or headaches. She is not hormonal therapy.   She takes OTC about 3 doses per week and triptan 1-2 times per week (total of 5 doses per week, or 20 total doses per month).   Patient occasionally goes to the ED for acute treatment, about 6 times in the last year, due to headache that she cannot control. She last went to the ED around 06/2022.   Past NSAIDS/analgesics:  tylenol, excedrin, ibuprofen Past abortive triptans:  sumatriptan (no longer helping) Past muscle relaxants:  flexeril Past anti-emetic:  phenergan Past antihypertensive medications:  propranolol, metoprolol Past antidepressant medications:  elavil (helped a lot) Past anticonvulsant medications:  topirimate (thinks it may have helped) Past vitamins/Herbal/Supplements:  none Past antihistamines/decongestants:  bendryl   Current NSAIDS/analgesics:  tylenol, excedrin, ibuprofen Current triptans:  rizatriptan (not helping) Current ergotamine:  none Current anti-emetic:  none Current muscle relaxants:  none Current Antihypertensive medications:  metoprolol Current Antidepressant medications:  none Current Anticonvulsant medications:  none Current anti-CGRP:  none Current Vitamins/Herbal/Supplements:  none Current Antihistamines/Decongestants:  bendryl Hormone/birth control:  none   Caffeine:  occasional Alcohol:  occasional wine Smoker:  no Diet:  normal american diet Exercise:  walks 3-4 times per week (about 1 mile); gets a lot of steps in daily Depression:  none; Anxiety:  gets panic attacks and anxiety (on adderall) Other pain:  gets a pain from the neck; getting worse of late Family history of headache:  sister had migraines  01/18/23: Nurtec was approved on 10/31/22. Patient was unaware and  did not ever get this medication.   She was diagnosed with COVID on 11/01/22. Her headaches have been worse since COVID. She had very severe headaches during COVID. She describes the headaches similar to prior,  pain behind eyes, banging, piercing sensation with photophobia, phonophobia, and nausea and vomiting. The headaches are more likely with weather changes, cold weather, or pressure changes. She is having about 5 headaches per week currently (~20 days of headache per month compared to ~16 days of headache on 10/27/22).   Patient started Elavil 25 mg qhs. She also had rizatriptan but it did not work. She takes phenergan for nausea.   Vit D was low at 8.47, so supplementation was recommended. She started this as recommended.  Most recent Assessment and Plan (01/18/23): This is Shelly Larson, a 52 y.o. female with: Chronic migraines without aura. She has previously failed propranolol, metoprolol, and topiramate for migraine prevention. She has failed tylenol, Excedrin, ibuprofen, sumatriptan, and rizatriptan for migraine rescue. Vit D deficiency   Plan: Migraine prevention:  Elavil 50 mg qhs Migraine rescue:  Nurtec 75 mg PRN Limit use of pain relievers to no more than 2 days out of week to prevent risk of rebound or medication-overuse headache. Physical therapy for neck pain and tightness Keep headache diary Continue vit D 1000 IU daily Follow up in 1 month  Since their last visit: ***  Resistant HTN?***  MEDICATIONS:  Outpatient Encounter Medications as of 02/16/2023  Medication Sig   albuterol (VENTOLIN HFA) 108 (90 Base) MCG/ACT inhaler Inhale 2 puffs into the lungs every 4 (four) hours as needed for wheezing or shortness of breath.    amitriptyline (ELAVIL) 25 MG tablet Take 2 tablets (50 mg total) by mouth at bedtime.   amLODipine (NORVASC) 10 MG tablet Take 1 tablet (10 mg total) by mouth daily.   amLODipine (NORVASC) 5 MG tablet TAKE 1 TABLET BY MOUTH EVERY DAY (Patient taking differently: Take 7.5 mg by mouth every morning.)   amoxicillin (AMOXIL) 500 MG capsule Take 500 mg by mouth 2 (two) times daily.   amphetamine-dextroamphetamine (ADDERALL XR) 30 MG 24 hr capsule Take 1  capsule (30 mg total) by mouth daily. (Patient not taking: Reported on 02/07/2023)   amphetamine-dextroamphetamine (ADDERALL XR) 30 MG 24 hr capsule Take 1 capsule (30 mg total) by mouth every morning. (Patient taking differently: Take 30 mg by mouth every morning. As needed)   amphetamine-dextroamphetamine (ADDERALL XR) 30 MG 24 hr capsule Take 1 capsule (30 mg total) by mouth every morning.   amphetamine-dextroamphetamine (ADDERALL XR) 30 MG 24 hr capsule Take 1 capsule (30 mg total) by mouth every morning.   amphetamine-dextroamphetamine (ADDERALL XR) 30 MG 24 hr capsule Take 1 capsule (30 mg total) by mouth every morning.   amphetamine-dextroamphetamine (ADDERALL XR) 30 MG 24 hr capsule Take 1 capsule (30 mg total) by mouth every morning.   [START ON 03/10/2023] amphetamine-dextroamphetamine (ADDERALL XR) 30 MG 24 hr capsule Take 1 capsule (30 mg total) by mouth every morning.   amphetamine-dextroamphetamine (ADDERALL) 15 MG tablet Take 1 tab po around 2 pm   amphetamine-dextroamphetamine (ADDERALL) 15 MG tablet Take 1 tab po around 2 pm   amphetamine-dextroamphetamine (ADDERALL) 15 MG tablet Take 1 tab po around 2 pm   ciprofloxacin-dexamethasone (CIPRODEX) OTIC suspension Place 4 drops into the left ear 2 (two) times daily.   clonazePAM (KLONOPIN) 0.5 MG tablet Take 1 tablet (0.5 mg total) by mouth 2 (two) times daily as needed for anxiety.  hydrocortisone (ANUSOL-HC) 2.5 % rectal cream Place 1 Application rectally 2 (two) times daily.   linaclotide (LINZESS) 290 MCG CAPS capsule Take 1 capsule (290 mcg total) by mouth daily before breakfast.   losartan-hydrochlorothiazide (HYZAAR) 100-25 MG tablet TAKE 1 TABLET BY MOUTH EVERY DAY   metoprolol succinate (TOPROL-XL) 50 MG 24 hr tablet Take 50 mg by mouth every morning.    omeprazole (PRILOSEC) 40 MG capsule TAKE 1 CAPSULE (40 MG TOTAL) BY MOUTH DAILY.   promethazine (PHENERGAN) 12.5 MG tablet Take 1 tablet (12.5 mg total) by mouth every 6 (six)  hours as needed for nausea or vomiting.   Rimegepant Sulfate (NURTEC) 75 MG TBDP Take 75 mg by mouth daily as needed.   No facility-administered encounter medications on file as of 02/16/2023.    PAST MEDICAL HISTORY: Past Medical History:  Diagnosis Date   ADHD    Allergies    Anemia    Arthritis    Asthma    Biliary acute pancreatitis 2010   Colon cancer ([place]) 2016   Transverse colon    Diabetes mellitus without complication ([place])    Dyspnea    GERD (gastroesophageal reflux disease)    Hypertension    Hypertension    Migraine     PAST SURGICAL HISTORY: Past Surgical History:  Procedure Laterality Date   ABLATION     BAND HEMORRHOIDECTOMY     BIOPSY  09/02/2021   Procedure: BIOPSY;  Surgeon: Eloise Harman, DO;  [facility];  Service: Endoscopy;;   BREAST REDUCTION SURGERY Bilateral    CHOLECYSTECTOMY  2010   [place], biliary pancreatitis   COLECTOMY  09/30/2015   distal transverse d/t adenocarcinoma   COLONOSCOPY N/A 06/11/2018   Procedure: COLONOSCOPY;  Surgeon: Daneil Dolin, MD;  [facility];  Service: Endoscopy;  Laterality: N/A;  1:45pm   DILITATION & CURRETTAGE/HYSTROSCOPY WITH NOVASURE ABLATION N/A 09/10/2018   Procedure: DILATATION & CURETTAGE/HYSTEROSCOPY WITH MINERVA  ENDOMETRIAL ABLATION;  Surgeon: Jonnie Kind, MD;  [facility];  Service: Gynecology;  Laterality: N/A;   ESOPHAGOGASTRODUODENOSCOPY (EGD) WITH PROPOFOL N/A 09/02/2021   Procedure: ESOPHAGOGASTRODUODENOSCOPY (EGD) WITH PROPOFOL;  Surgeon: Eloise Harman, DO;  [facility];  Service: Endoscopy;  Laterality: N/A;   POLYPECTOMY  06/11/2018   Procedure: POLYPECTOMY;  Surgeon: Daneil Dolin, MD;  [facility];  Service: Endoscopy;;  colon    ALLERGIES: Allergies  Allergen Reactions   Lisinopril Cough    FAMILY HISTORY: Family History  Problem Relation Age of Onset   Hypertension Mother    Diabetes Mellitus II Mother    Heart  disease Sister        dx in late 84's   Breast cancer Paternal Grandmother    Breast cancer Paternal Aunt    Colon cancer Neg Hx    Liver disease Neg Hx     SOCIAL HISTORY: Social History   Tobacco Use   Smoking status: Never   Smokeless tobacco: Never  Vaping Use   Vaping Use: Never used  Substance Use Topics   Alcohol use: Yes    Comment: rare   Drug use: No   Social History   Social History Narrative   Are you right handed or left handed? right   Are you currently employed ? 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 And niece   What type of home do  you live in: 1 story or 2 story? One     Caffeine 1 soda a day      Objective:  Vital Signs:  There were no vitals taken for this visit.  ***  Labs and Imaging review: New results: HbA1c (01/09/22): 6.3  Previously reviewed results: Vit D (10/27/22): 8.47 B12 (10/27/22): 392  Recent Labs           Lab Results  Component Value Date    HGBA1C 6.3 08/17/2022      Recent Labs           Lab Results  Component Value Date    TSH 1.42 08/17/2022      Recent Labs'[]'$ Expand by Default           Lab Results  Component Value Date    ESRSEDRATE 25 (H) 08/31/2021      Normal or unremarkable: CBC, CMP   Iron studies: iron 81, TIBC 364, sat ratio 22.3, ferritin 39.7  Assessment/Plan:  This is Shelly Larson, a 52 y.o. female with: Chronic migraines without aura. She has previously failed propranolol, metoprolol, and topiramate for migraine prevention. She has failed tylenol, Excedrin, ibuprofen, sumatriptan, and rizatriptan for migraine rescue. Vit D deficiency   Plan*** Migraine prevention:  Elavil 50 mg qhs Migraine rescue:  Nurtec 75 mg PRN Limit use of pain relievers to no more than 2 days out of week to prevent risk of rebound or medication-overuse headache. Physical therapy for neck pain and tightness Keep headache diary Continue vit D  1000 IU daily Follow up in ***  Total time spent reviewing records, interview, history/exam, documentation, and coordination of care on day of encounter:  *** min  Kai Levins, MD

## 2023-02-11 NOTE — Assessment & Plan Note (Signed)
Blood pressure is still fluctuating, but seems to be trending higher.  Plan to increase amlodipine to 10 mg daily. Cont Losartan-HCTZ 100-25 mg Cont Toprol XL 50 mg Monitor at home Lifestyle changes encouraged Referral to cardiology for help with resistant HTN

## 2023-02-16 ENCOUNTER — Encounter: Payer: Self-pay | Admitting: Neurology

## 2023-02-16 ENCOUNTER — Ambulatory Visit: Payer: Managed Care, Other (non HMO) | Admitting: Neurology

## 2023-02-16 DIAGNOSIS — Z029 Encounter for administrative examinations, unspecified: Secondary | ICD-10-CM

## 2023-02-19 ENCOUNTER — Ambulatory Visit (HOSPITAL_BASED_OUTPATIENT_CLINIC_OR_DEPARTMENT_OTHER): Payer: Managed Care, Other (non HMO) | Admitting: Cardiology

## 2023-02-26 ENCOUNTER — Ambulatory Visit: Payer: Managed Care, Other (non HMO) | Attending: Neurology | Admitting: Physical Therapy

## 2023-03-07 ENCOUNTER — Telehealth: Payer: Self-pay | Admitting: *Deleted

## 2023-03-07 ENCOUNTER — Telehealth (INDEPENDENT_AMBULATORY_CARE_PROVIDER_SITE_OTHER): Payer: Self-pay | Admitting: *Deleted

## 2023-03-07 NOTE — Telephone Encounter (Signed)
-----   Message from Daneil Dolin, MD sent at 03/07/2023  1:11 PM EDT ----- Regarding: RE: NEEDS surveillance colonoscopy Thanks Randall Hiss.  Will get her back in for a colonoscopy.  Ronalee Belts ----- Message ----- From: Greer Pickerel, MD Sent: 03/07/2023  12:30 PM EDT To: Daneil Dolin, MD Subject: NEEDS surveillance colonoscopy                 Hi   This patient reportedly stage II colon cancer in 2016 operated on at [facility] did a colonoscopy around 2019 and found a polyp.  She states that she is due for surveillance this year.  Patient is considering and being evaluated for bariatric surgery.  Would appreciate surveillance colonoscopy prior to bariatric surgery if she is in fact due for it  Thanks Park Breed M. Redmond Pulling, MD, FACS General, Bariatric, & Minimally Invasive Surgery [facility],  [place]

## 2023-03-07 NOTE — Telephone Encounter (Signed)
Susan/Mandy, please schedule OV for pt to come in to schedule TCS. Thanks!

## 2023-03-07 NOTE — Telephone Encounter (Signed)
Pt is scheduled to see Dr. Harrell Gave 04/10/2023, clearance will be addressed at that time, as pt not seen since 2018.    Will route to requesting surgeon's office to make them aware.       Pre-operative Risk Assessment    Patient Name: Shelly Larson  DOB: [birth year] MRN: [id number]      Request for Surgical Clearance    Procedure:   BARIATRIC SURGERY  Date of Surgery:  Clearance TBD                                 Surgeon:  Greer Pickerel, MD Surgeon's Group or Practice Name:  [facility] Phone number:  [phone] Fax number:  [phone]   Type of Clearance Requested:   - Medical    Type of Anesthesia:  General    Additional requests/questions:    [name]   03/07/2023, 2:11 PM

## 2023-03-08 ENCOUNTER — Other Ambulatory Visit (HOSPITAL_COMMUNITY): Payer: Self-pay | Admitting: General Surgery

## 2023-03-18 NOTE — Progress Notes (Unsigned)
GI Office Note    Referring Provider: Allwardt, Randa Evens, PA-C Primary Care Physician:  Allwardt, Randa Evens, PA-C Primary Gastroenterologist: Cristopher Estimable.Rourk, MD  Date:  03/19/2023  ID:  Shelly Larson, DOB August 28, 1971, MRN [id number]   Chief Complaint   Chief Complaint  Patient presents with   New Patient (Initial Visit)    Pt here for colonoscopy visit   History of Present Illness  Shelly Larson is a 52 y.o. female with a history of anemia, ADHD, biliary pancreatitis, diabets, HTN, migraines, and colon cancer s/p colectomy in September 2016 presenting today to schedule surveillance colonoscopy in preparation for bariatric surgery.   Colonoscopy 06/11/2018: -grade 3 hemorrhoids -single 5 mm tubular adenoma in sigmoid colon -external and internal hemorrhoids -She was given information on hemorrhoid banding as she was symptomatic. -Due for surveillance in 2024.   She underwent [facility] banding of right anterior internal hemorrhoid 10/15/2018.   Hospitalization in 2022 for abdominal pain and elevated LFTs. Workup positive for acute Hep B and underwent liver biopsy demonstrating acute viral hepatitis. EGD with H. Pylori gastritis. 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 Did not have follow up labs in 8 weeks but labs in August 2023 with normal LFTs and bilirubin.   Last office visit 09/04/22. Constipation with cramping and bloating. Some improvement previously with Linzess. Intermittent bleeding secondary to hemorrhoids. Otc medications and therapies not helpful. Having nocturnal GERD symptoms. Given Linzess 290 samples. Omeprazole increased to 40 mg daily prior to dinner. Anusol BID for 1 week. Offered repeat banding.   Today: Constipation - Improved on Linzess 290 mcg. Usually once to twice daily BM. Not having abdominal cramping. Rare straining. Very mild rare abdominal pain. No melena or brbpr. No unintentional weight loss.  Good appetite. No diarrhea.   Energy level  now is good.   GERD - reflux is better on omeprazole 40 mg once daily. No N/V, dysphagia.  Has multiple appointments this week, psych eval, nutritional eval etc. Will be seeing cardiology in 1 week regarding her BP. Not taking Topamax.     Current Outpatient Medications  Medication Sig Dispense Refill   albuterol (VENTOLIN HFA) 108 (90 Base) MCG/ACT inhaler Inhale 2 puffs into the lungs every 4 (four) hours as needed for wheezing or shortness of breath.      amitriptyline (ELAVIL) 25 MG tablet Take 2 tablets (50 mg total) by mouth at bedtime. 60 tablet 11   amLODipine (NORVASC) 10 MG tablet Take 1 tablet (10 mg total) by mouth daily. 30 tablet 2   amphetamine-dextroamphetamine (ADDERALL) 15 MG tablet Take 1 tab po around 2 pm 30 tablet 0   hydrocortisone (ANUSOL-HC) 2.5 % rectal cream Place 1 Application rectally 2 (two) times daily. 30 g 1   linaclotide (LINZESS) 290 MCG CAPS capsule Take 1 capsule (290 mcg total) by mouth daily before breakfast. 30 capsule 5   losartan-hydrochlorothiazide (HYZAAR) 100-25 MG tablet TAKE 1 TABLET BY MOUTH EVERY DAY 90 tablet 1   metoprolol succinate (TOPROL-XL) 50 MG 24 hr tablet Take 50 mg by mouth every morning.   0   promethazine (PHENERGAN) 12.5 MG tablet Take 1 tablet (12.5 mg total) by mouth every 6 (six) hours as needed for nausea or vomiting. 30 tablet 0   Rimegepant Sulfate (NURTEC) 75 MG TBDP Take 75 mg by mouth daily as needed. 16 tablet 3   amphetamine-dextroamphetamine (ADDERALL XR) 30 MG 24 hr capsule Take 1 capsule (30 mg total) by mouth  every morning. (Patient taking differently: Take 30 mg by mouth every morning. As needed) 30 capsule 0   clonazePAM (KLONOPIN) 0.5 MG tablet Take 1 tablet (0.5 mg total) by mouth 2 (two) times daily as needed for anxiety. 20 tablet 1   omeprazole (PRILOSEC) 40 MG capsule Take 1 capsule (40 mg total) by mouth daily. 30 capsule 5   No current facility-administered medications for this visit.    Past Medical  History:  Diagnosis Date   ADHD    Allergies    Anemia    Arthritis    Asthma    Biliary acute pancreatitis 2010   Colon cancer ([place]) 2016   Transverse colon    Diabetes mellitus without complication ([place])    Dyspnea    GERD (gastroesophageal reflux disease)    Hypertension    Hypertension    Migraine     Past Surgical History:  Procedure Laterality Date   ABLATION     BAND HEMORRHOIDECTOMY     BIOPSY  09/02/2021   Procedure: BIOPSY;  Surgeon: Eloise Harman, DO;  [facility];  Service: Endoscopy;;   BREAST REDUCTION SURGERY Bilateral    CHOLECYSTECTOMY  2010   [place], biliary pancreatitis   COLECTOMY  09/30/2015   distal transverse d/t adenocarcinoma   COLONOSCOPY N/A 06/11/2018   Procedure: COLONOSCOPY;  Surgeon: Daneil Dolin, MD;  [facility];  Service: Endoscopy;  Laterality: N/A;  1:45pm   DILITATION & CURRETTAGE/HYSTROSCOPY WITH NOVASURE ABLATION N/A 09/10/2018   Procedure: DILATATION & CURETTAGE/HYSTEROSCOPY WITH MINERVA  ENDOMETRIAL ABLATION;  Surgeon: Jonnie Kind, MD;  Location: [facility];  Service: Gynecology;  Laterality: N/A;   ESOPHAGOGASTRODUODENOSCOPY (EGD) WITH PROPOFOL N/A 09/02/2021   Procedure: ESOPHAGOGASTRODUODENOSCOPY (EGD) WITH PROPOFOL;  Surgeon: Eloise Harman, DO;  [facility];  Service: Endoscopy;  Laterality: N/A;   POLYPECTOMY  06/11/2018   Procedure: POLYPECTOMY;  Surgeon: Daneil Dolin, MD;  [facility];  Service: Endoscopy;;  colon    Family History  Problem Relation Age of Onset   Hypertension Mother    Diabetes Mellitus II Mother    Heart disease Sister        dx in late 30's   Breast cancer Paternal Grandmother    Breast cancer Paternal Aunt    Colon cancer Neg Hx    Liver disease Neg Hx     Allergies as of 03/19/2023 - Review Complete 03/19/2023  Allergen Reaction Noted   Lisinopril Cough 05/08/2018    Social History   Socioeconomic History   Marital status:  Single    Spouse name: Not on file   Number of children: Not on file   Years of education: Not on file   Highest education level: Not on file  Occupational History   Occupation: Public affairs consultant  Tobacco Use   Smoking status: Never   Smokeless tobacco: Never  Vaping Use   Vaping Use: Never used  Substance and Sexual Activity   Alcohol use: Yes    Comment: rare   Drug use: No   Sexual activity: Yes    Birth control/protection: None, Surgical    Comment: ablation  Other Topics Concern   Not on file  Social History Narrative   Are you right handed or left handed? right   Are you currently employed ? 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 And niece  What type of home do you live in: 1 story or 2 story? One     Caffeine 1 soda a day   Social Determinants of Health   Financial Resource Strain: Not on file  Food Insecurity: Not on file  Transportation Needs: Not on file  Physical Activity: Not on file  Stress: Not on file  Social Connections: Not on file     Review of Systems   Gen: Denies fever, chills, anorexia. Denies fatigue, weakness, weight loss.  CV: Denies chest pain, palpitations, syncope, peripheral edema, and claudication. Resp: Denies dyspnea at rest, cough, wheezing, coughing up blood, and pleurisy. GI: See HPI Derm: Denies rash, itching, dry skin Psych: Denies depression, anxiety, memory loss, confusion. No homicidal or suicidal ideation.  Heme: Denies bruising, bleeding, and enlarged lymph nodes.   Physical Exam   BP 129/85   Pulse 76   Temp 97.6 F (36.4 C)   Ht 5\' 10"  (1.778 m)   Wt 282 lb 12.8 oz (128.3 kg)   BMI 40.58 kg/m   General:   Alert and oriented. No distress noted. Pleasant and cooperative.  Head:  Normocephalic and atraumatic. Eyes:  Conjuctiva clear without scleral icterus. Mouth:  Oral mucosa pink and moist. Good dentition. No lesions. Lungs:  Clear to  auscultation bilaterally. No wheezes, rales, or rhonchi. No distress.  Heart:  S1, S2 present without murmurs appreciated.  Abdomen:  +BS, soft, non-tender and non-distended. No rebound or guarding. No HSM or masses noted. Rectal: deferred Msk:  Symmetrical without gross deformities. Normal posture. Extremities:  Without edema. Neurologic:  Alert and  oriented x4 Psych:  Alert and cooperative. Normal mood and affect.   Assessment  Shelly Larson is a 52 y.o. female with a history of anemia, ADHD, biliary pancreatitis, diabets, HTN, migraines, and colon cancer s/p colectomy in September 2016 presenting today to schedule surveillance colonoscopy in preparation for bariatric surgery.   IBS-C: No longer having abdominal pain.  Does have some occasional need to strain.  On average having 1-2 bowel movements daily on her Linzess 290 mcg.  Will continue with current regimen.  GERD: Well-controlled on omeprazole 40 mg once daily.  History of colon cancer and polyps: History of colon cancer in 2016 s/p colectomy.  Last colonoscopy in June 2019 with grade 3 hemorrhoids, single tubular adenoma in the sigmoid colon.  Advise repeat in 2024.  Given that she is technically not due until June she needs colonoscopy prior to scheduling gastric sleeve/Roux-en-Y gastric bypass therefore we will go ahead and get her scheduled.  We will augment her prep with an extra half day of clears to ensure good prep.   PLAN   Proceed with colonoscopy with propofol by Dr. Gala Romney in near future: the risks, benefits, and alternatives have been discussed with the patient in detail. The patient states understanding and desires to proceed. ASA 3 Extra 1/2 day clears Continue Linzess 290 mcg daily. Continue omeprazole 40 mg once daily.  Refilled today. Continue Linzess 290 mcg once daily.  GERD diet/lifestyle modifications.  Follow up in 6 months.     Venetia Night, MSN, FNP-BC, AGACNP-BC [facility]

## 2023-03-18 NOTE — H&P (View-Only) (Signed)
 GI Office Note    Referring Provider: Allwardt, Alyssa M, PA-C Primary Care Physician:  Allwardt, Alyssa M, PA-C Primary Gastroenterologist: Robert M.Rourk, MD  Date:  03/19/2023  ID:  Shelly Larson, DOB 03/21/1971, MRN [id number]   Chief Complaint   Chief Complaint  Patient presents with   New Patient (Initial Visit)    Pt here for colonoscopy visit   History of Present Illness  Shelly Larson is a 51 y.o. female with a history of anemia, ADHD, biliary pancreatitis, diabets, HTN, migraines, and colon cancer s/p colectomy in September 2016 presenting today to schedule surveillance colonoscopy in preparation for bariatric surgery.   Colonoscopy 06/11/2018: -grade 3 hemorrhoids -single 5 mm tubular adenoma in sigmoid colon -external and internal hemorrhoids -She was given information on hemorrhoid banding as she was symptomatic. -Due for surveillance in 2024.   She underwent [facility] banding of right anterior internal hemorrhoid 10/15/2018.   Hospitalization in 2022 for abdominal pain and elevated LFTs. Workup positive for acute Hep B and underwent liver biopsy demonstrating acute viral hepatitis. EGD with H. Pylori gastritis. 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 Did not have follow up labs in 8 weeks but labs in August 2023 with normal LFTs and bilirubin.   Last office visit 09/04/22. Constipation with cramping and bloating. Some improvement previously with Linzess. Intermittent bleeding secondary to hemorrhoids. Otc medications and therapies not helpful. Having nocturnal GERD symptoms. Given Linzess 290 samples. Omeprazole increased to 40 mg daily prior to dinner. Anusol BID for 1 week. Offered repeat banding.   Today: Constipation - Improved on Linzess 290 mcg. Usually once to twice daily BM. Not having abdominal cramping. Rare straining. Very mild rare abdominal pain. No melena or brbpr. No unintentional weight loss.  Good appetite. No diarrhea.   Energy level  now is good.   GERD - reflux is better on omeprazole 40 mg once daily. No N/V, dysphagia.  Has multiple appointments this week, psych eval, nutritional eval etc. Will be seeing cardiology in 1 week regarding her BP. Not taking Topamax.     Current Outpatient Medications  Medication Sig Dispense Refill   albuterol (VENTOLIN HFA) 108 (90 Base) MCG/ACT inhaler Inhale 2 puffs into the lungs every 4 (four) hours as needed for wheezing or shortness of breath.      amitriptyline (ELAVIL) 25 MG tablet Take 2 tablets (50 mg total) by mouth at bedtime. 60 tablet 11   amLODipine (NORVASC) 10 MG tablet Take 1 tablet (10 mg total) by mouth daily. 30 tablet 2   amphetamine-dextroamphetamine (ADDERALL) 15 MG tablet Take 1 tab po around 2 pm 30 tablet 0   hydrocortisone (ANUSOL-HC) 2.5 % rectal cream Place 1 Application rectally 2 (two) times daily. 30 g 1   linaclotide (LINZESS) 290 MCG CAPS capsule Take 1 capsule (290 mcg total) by mouth daily before breakfast. 30 capsule 5   losartan-hydrochlorothiazide (HYZAAR) 100-25 MG tablet TAKE 1 TABLET BY MOUTH EVERY DAY 90 tablet 1   metoprolol succinate (TOPROL-XL) 50 MG 24 hr tablet Take 50 mg by mouth every morning.   0   promethazine (PHENERGAN) 12.5 MG tablet Take 1 tablet (12.5 mg total) by mouth every 6 (six) hours as needed for nausea or vomiting. 30 tablet 0   Rimegepant Sulfate (NURTEC) 75 MG TBDP Take 75 mg by mouth daily as needed. 16 tablet 3   amphetamine-dextroamphetamine (ADDERALL XR) 30 MG 24 hr capsule Take 1 capsule (30 mg total) by mouth   every morning. (Patient taking differently: Take 30 mg by mouth every morning. As needed) 30 capsule 0   clonazePAM (KLONOPIN) 0.5 MG tablet Take 1 tablet (0.5 mg total) by mouth 2 (two) times daily as needed for anxiety. 20 tablet 1   omeprazole (PRILOSEC) 40 MG capsule Take 1 capsule (40 mg total) by mouth daily. 30 capsule 5   No current facility-administered medications for this visit.    Past Medical  History:  Diagnosis Date   ADHD    Allergies    Anemia    Arthritis    Asthma    Biliary acute pancreatitis 2010   Colon cancer ([facility]) 2016   Transverse colon    Diabetes mellitus without complication ([facility])    Dyspnea    GERD (gastroesophageal reflux disease)    Hypertension    Hypertension    Migraine     Past Surgical History:  Procedure Laterality Date   ABLATION     BAND HEMORRHOIDECTOMY     BIOPSY  09/02/2021   Procedure: BIOPSY;  Surgeon: Carver, Charles K, DO;  [facility];  Service: Endoscopy;;   BREAST REDUCTION SURGERY Bilateral    CHOLECYSTECTOMY  2010   [place], biliary pancreatitis   COLECTOMY  09/30/2015   distal transverse d/t adenocarcinoma   COLONOSCOPY N/A 06/11/2018   Procedure: COLONOSCOPY;  Surgeon: Rourk, Robert M, MD;  [facility];  Service: Endoscopy;  Laterality: N/A;  1:45pm   DILITATION & CURRETTAGE/HYSTROSCOPY WITH NOVASURE ABLATION N/A 09/10/2018   Procedure: DILATATION & CURETTAGE/HYSTEROSCOPY WITH MINERVA  ENDOMETRIAL ABLATION;  Surgeon: Ferguson, John V, MD;  Location: [facility];  Service: Gynecology;  Laterality: N/A;   ESOPHAGOGASTRODUODENOSCOPY (EGD) WITH PROPOFOL N/A 09/02/2021   Procedure: ESOPHAGOGASTRODUODENOSCOPY (EGD) WITH PROPOFOL;  Surgeon: Carver, Charles K, DO;  [facility];  Service: Endoscopy;  Laterality: N/A;   POLYPECTOMY  06/11/2018   Procedure: POLYPECTOMY;  Surgeon: Rourk, Robert M, MD;  [facility];  Service: Endoscopy;;  colon    Family History  Problem Relation Age of Onset   Hypertension Mother    Diabetes Mellitus II Mother    Heart disease Sister        dx in late 30's   Breast cancer Paternal Grandmother    Breast cancer Paternal Aunt    Colon cancer Neg Hx    Liver disease Neg Hx     Allergies as of 03/19/2023 - Review Complete 03/19/2023  Allergen Reaction Noted   Lisinopril Cough 05/08/2018    Social History   Socioeconomic History   Marital status:  Single    Spouse name: Not on file   Number of children: Not on file   Years of education: Not on file   Highest education level: Not on file  Occupational History   Occupation: warranty analyst  Tobacco Use   Smoking status: Never   Smokeless tobacco: Never  Vaping Use   Vaping Use: Never used  Substance and Sexual Activity   Alcohol use: Yes    Comment: rare   Drug use: No   Sexual activity: Yes    Birth control/protection: None, Surgical    Comment: ablation  Other Topics Concern   Not on file  Social History Narrative   Are you right handed or left handed? right   Are you currently employed ? 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 And niece     What type of home do you live in: 1 story or 2 story? One     Caffeine 1 soda a day   Social Determinants of Health   Financial Resource Strain: Not on file  Food Insecurity: Not on file  Transportation Needs: Not on file  Physical Activity: Not on file  Stress: Not on file  Social Connections: Not on file     Review of Systems   Gen: Denies fever, chills, anorexia. Denies fatigue, weakness, weight loss.  CV: Denies chest pain, palpitations, syncope, peripheral edema, and claudication. Resp: Denies dyspnea at rest, cough, wheezing, coughing up blood, and pleurisy. GI: See HPI Derm: Denies rash, itching, dry skin Psych: Denies depression, anxiety, memory loss, confusion. No homicidal or suicidal ideation.  Heme: Denies bruising, bleeding, and enlarged lymph nodes.   Physical Exam   BP 129/85   Pulse 76   Temp 97.6 F (36.4 C)   Ht 5' 10" (1.778 m)   Wt 282 lb 12.8 oz (128.3 kg)   BMI 40.58 kg/m   General:   Alert and oriented. No distress noted. Pleasant and cooperative.  Head:  Normocephalic and atraumatic. Eyes:  Conjuctiva clear without scleral icterus. Mouth:  Oral mucosa pink and moist. Good dentition. No lesions. Lungs:  Clear to  auscultation bilaterally. No wheezes, rales, or rhonchi. No distress.  Heart:  S1, S2 present without murmurs appreciated.  Abdomen:  +BS, soft, non-tender and non-distended. No rebound or guarding. No HSM or masses noted. Rectal: deferred Msk:  Symmetrical without gross deformities. Normal posture. Extremities:  Without edema. Neurologic:  Alert and  oriented x4 Psych:  Alert and cooperative. Normal mood and affect.   Assessment  Shelly Larson is a 51 y.o. female with a history of anemia, ADHD, biliary pancreatitis, diabets, HTN, migraines, and colon cancer s/p colectomy in September 2016 presenting today to schedule surveillance colonoscopy in preparation for bariatric surgery.   IBS-C: No longer having abdominal pain.  Does have some occasional need to strain.  On average having 1-2 bowel movements daily on her Linzess 290 mcg.  Will continue with current regimen.  GERD: Well-controlled on omeprazole 40 mg once daily.  History of colon cancer and polyps: History of colon cancer in 2016 s/p colectomy.  Last colonoscopy in June 2019 with grade 3 hemorrhoids, single tubular adenoma in the sigmoid colon.  Advise repeat in 2024.  Given that she is technically not due until June she needs colonoscopy prior to scheduling gastric sleeve/Roux-en-Y gastric bypass therefore we will go ahead and get her scheduled.  We will augment her prep with an extra half day of clears to ensure good prep.   PLAN   Proceed with colonoscopy with propofol by Dr. Rourk in near future: the risks, benefits, and alternatives have been discussed with the patient in detail. The patient states understanding and desires to proceed. ASA 3 Extra 1/2 day clears Continue Linzess 290 mcg daily. Continue omeprazole 40 mg once daily.  Refilled today. Continue Linzess 290 mcg once daily.  GERD diet/lifestyle modifications.  Follow up in 6 months.     Carthel Castille, MSN, FNP-BC, AGACNP-BC [facility] 

## 2023-03-19 ENCOUNTER — Ambulatory Visit: Payer: Managed Care, Other (non HMO) | Admitting: Gastroenterology

## 2023-03-19 ENCOUNTER — Encounter: Payer: Self-pay | Admitting: *Deleted

## 2023-03-19 ENCOUNTER — Encounter: Payer: Self-pay | Admitting: Gastroenterology

## 2023-03-19 ENCOUNTER — Other Ambulatory Visit: Payer: Self-pay | Admitting: *Deleted

## 2023-03-19 VITALS — BP 129/85 | HR 76 | Temp 97.6°F | Ht 70.0 in | Wt 282.8 lb

## 2023-03-19 DIAGNOSIS — K581 Irritable bowel syndrome with constipation: Secondary | ICD-10-CM

## 2023-03-19 DIAGNOSIS — K219 Gastro-esophageal reflux disease without esophagitis: Secondary | ICD-10-CM

## 2023-03-19 DIAGNOSIS — Z85038 Personal history of other malignant neoplasm of large intestine: Secondary | ICD-10-CM | POA: Diagnosis not present

## 2023-03-19 DIAGNOSIS — Z86010 Personal history of colonic polyps: Secondary | ICD-10-CM

## 2023-03-19 MED ORDER — PEG 3350-KCL-NA BICARB-NACL 420 G PO SOLR: 4000.0000 mL | Freq: Once | ORAL | 0 refills | Status: AC

## 2023-03-19 MED ORDER — OMEPRAZOLE 40 MG PO CPDR: 40.0000 mg | DELAYED_RELEASE_CAPSULE | Freq: Every day | ORAL | 5 refills | Status: AC

## 2023-03-19 NOTE — Patient Instructions (Signed)
We are scheduling you for a colonoscopy in the near future with Dr. Gala Romney.  Continue taking your Linzess 290 mcg once daily.  I have sent in a refill for omeprazole 40 mg to take once daily 30 minutes prior to breakfast. Continue to follow a GERD diet:  Avoid fried, fatty, greasy, spicy, citrus foods. Avoid caffeine and carbonated beverages. Avoid chocolate. Try eating 4-6 small meals a day rather than 3 large meals. Do not eat within 3 hours of laying down. Prop head of bed up on wood or bricks to create a 6 inch incline.   We will see you in 6 months, sooner if needed.  It was a pleasure to see you today. I want to create trusting relationships with patients. If you receive a survey regarding your visit,  I greatly appreciate you taking time to fill this out on paper or through your MyChart. I value your feedback.  Venetia Night, MSN, FNP-BC, AGACNP-BC [facility]

## 2023-03-20 ENCOUNTER — Ambulatory Visit: Payer: Managed Care, Other (non HMO) | Admitting: Dietician

## 2023-03-22 ENCOUNTER — Ambulatory Visit (HOSPITAL_COMMUNITY)
Admission: RE | Admit: 2023-03-22 | Discharge: 2023-03-22 | Disposition: A | Discharge status: Home / Self Care | Payer: Managed Care, Other (non HMO) | Source: Ambulatory Visit | Attending: General Surgery | Admitting: General Surgery

## 2023-03-22 ENCOUNTER — Other Ambulatory Visit: Payer: Self-pay

## 2023-03-22 ENCOUNTER — Other Ambulatory Visit (HOSPITAL_COMMUNITY): Payer: Self-pay | Admitting: General Surgery

## 2023-03-22 DIAGNOSIS — Z01818 Encounter for other preprocedural examination: Secondary | ICD-10-CM | POA: Diagnosis present

## 2023-03-26 ENCOUNTER — Ambulatory Visit: Payer: Managed Care, Other (non HMO) | Admitting: Dietician

## 2023-03-27 NOTE — Patient Instructions (Addendum)
Shelly Larson  03/27/2023     @PREFPERIOPPHARMACY @   Your procedure is scheduled on  04/02/2023.   Report to [name] at  1015  A.M.   Call this number if you have problems the morning of surgery:  [phone]  If you experience any cold or flu symptoms such as cough, fever, chills, shortness of breath, etc. between now and your scheduled surgery, please notify us at the above number.   Remember:  Follow the diet and prep instructions given to you by the office.     Use your inhaler before you come and bring your rescue inhaler with you.     Take these medicines the morning of surgery with A SIP OF WATER           amlodipine, clonazepam, metoprolol, omeprazole, nurtec(if needed).     Do not wear jewelry, make-up or nail polish.  Do not wear lotions, powders, or perfumes, or deodorant.  Do not shave 48 hours prior to surgery.  Men may shave face and neck.  Do not bring valuables to the hospital.  [facility] is not responsible for any belongings or valuables.  Contacts, dentures or bridgework may not be worn into surgery.  Leave your suitcase in the car.  After surgery it may be brought to your room.  For patients admitted to the hospital, discharge time will be determined by your treatment team.  Patients discharged the day of surgery will not be allowed to drive home and must have someone with them for 24 hours.    Special instructions:   DO NOT smoke tobacco or vape for 24 hours before your procedure.  Please read over the following fact sheets that you were given. Anesthesia Post-op Instructions and Care and Recovery After Surgery        Colonoscopy, Adult, Care After The following information offers guidance on how to care for yourself after your procedure. Your health care provider may also give you more specific instructions. If you have problems or questions, contact your health care provider. What can I expect after the procedure? After the  procedure, it is common to have: A small amount of blood in your stool for 24 hours after the procedure. Some gas. Mild cramping or bloating of your abdomen. Follow these instructions at home: Eating and drinking  Drink enough fluid to keep your urine pale yellow. Follow instructions from your health care provider about eating or drinking restrictions. Resume your normal diet as told by your health care provider. Avoid heavy or fried foods that are hard to digest. Activity Rest as told by your health care provider. Avoid sitting for a long time without moving. Get up to take short walks every 1-2 hours. This is important to improve blood flow and breathing. Ask for help if you feel weak or unsteady. Return to your normal activities as told by your health care provider. Ask your health care provider what activities are safe for you. Managing cramping and bloating  Try walking around when you have cramps or feel bloated. If directed, apply heat to your abdomen as told by your health care provider. Use the heat source that your health care provider recommends, such as a moist heat pack or a heating pad. Place a towel between your skin and the heat source. Leave the heat on for 20-30 minutes. Remove the heat if your skin turns bright red. This is especially important if you are unable to feel pain,  heat, or cold. You have a greater risk of getting burned. General instructions If you were given a sedative during the procedure, it can affect you for several hours. Do not drive or operate machinery until your health care provider says that it is safe. For the first 24 hours after the procedure: Do not sign important documents. Do not drink alcohol. Do your regular daily activities at a slower pace than normal. Eat soft foods that are easy to digest. Take over-the-counter and prescription medicines only as told by your health care provider. Keep all follow-up visits. This is important. Contact  a health care provider if: You have blood in your stool 2-3 days after the procedure. Get help right away if: You have more than a small spotting of blood in your stool. You have large blood clots in your stool. You have swelling of your abdomen. You have nausea or vomiting. You have a fever. You have increasing pain in your abdomen that is not relieved with medicine. These symptoms may be an emergency. Get help right away. Call 911. Do not wait to see if the symptoms will go away. Do not drive yourself to the hospital. Summary After the procedure, it is common to have a small amount of blood in your stool. You may also have mild cramping and bloating of your abdomen. If you were given a sedative during the procedure, it can affect you for several hours. Do not drive or operate machinery until your health care provider says that it is safe. Get help right away if you have a lot of blood in your stool, nausea or vomiting, a fever, or increased pain in your abdomen. This information is not intended to replace advice given to you by your health care provider. Make sure you discuss any questions you have with your health care provider. Document Revised: 08/03/2021 Document Reviewed: 08/03/2021 Elsevier Patient Education  [place] After The following information offers guidance on how to care for yourself after your procedure. Your health care provider may also give you more specific instructions. If you have problems or questions, contact your health care provider. What can I expect after the procedure? After the procedure, it is common to have: Tiredness. Little or no memory about what happened during or after the procedure. Impaired judgment when it comes to making decisions. Nausea or vomiting. Some trouble with balance. Follow these instructions at home: For the time period you were told by your health care provider:  Rest. Do not participate  in activities where you could fall or become injured. Do not drive or use machinery. Do not drink alcohol. Do not take sleeping pills or medicines that cause drowsiness. Do not make important decisions or sign legal documents. Do not take care of children on your own. Medicines Take over-the-counter and prescription medicines only as told by your health care provider. If you were prescribed antibiotics, take them as told by your health care provider. Do not stop using the antibiotic even if you start to feel better. Eating and drinking Follow instructions from your health care provider about what you may eat and drink. Drink enough fluid to keep your urine pale yellow. If you vomit: Drink clear fluids slowly and in small amounts as you are able. Clear fluids include water, ice chips, low-calorie sports drinks, and fruit juice that has water added to it (diluted fruit juice). Eat light and bland foods in small amounts as you are able. These foods  include bananas, applesauce, rice, lean meats, toast, and crackers. General instructions  Have a responsible adult stay with you for the time you are told. It is important to have someone help care for you until you are awake and alert. If you have sleep apnea, surgery and some medicines can increase your risk for breathing problems. Follow instructions from your health care provider about wearing your sleep device: When you are sleeping. This includes during daytime naps. While taking prescription pain medicines, sleeping medicines, or medicines that make you drowsy. Do not use any products that contain nicotine or tobacco. These products include cigarettes, chewing tobacco, and vaping devices, such as e-cigarettes. If you need help quitting, ask your health care provider. Contact a health care provider if: You feel nauseous or vomit every time you eat or drink. You feel light-headed. You are still sleepy or having trouble with balance after 24  hours. You get a rash. You have a fever. You have redness or swelling around the IV site. Get help right away if: You have trouble breathing. You have new confusion after you get home. These symptoms may be an emergency. Get help right away. Call 911. Do not wait to see if the symptoms will go away. Do not drive yourself to the hospital. This information is not intended to replace advice given to you by your health care provider. Make sure you discuss any questions you have with your health care provider. Document Revised: 05/08/2022 Document Reviewed: 05/08/2022 Elsevier Patient Education  [place].

## 2023-03-28 ENCOUNTER — Telehealth: Payer: Self-pay | Admitting: *Deleted

## 2023-03-28 ENCOUNTER — Encounter (HOSPITAL_COMMUNITY)
Admission: RE | Admit: 2023-03-28 | Discharge: 2023-03-28 | Disposition: A | Discharge status: Home / Self Care | Payer: Managed Care, Other (non HMO) | Source: Ambulatory Visit | Attending: Internal Medicine | Admitting: Internal Medicine

## 2023-03-28 ENCOUNTER — Encounter (HOSPITAL_COMMUNITY): Payer: Self-pay

## 2023-03-28 ENCOUNTER — Other Ambulatory Visit: Payer: Self-pay | Admitting: *Deleted

## 2023-03-28 ENCOUNTER — Encounter: Payer: Self-pay | Admitting: *Deleted

## 2023-03-28 VITALS — BP 124/87 | HR 91 | Temp 97.8°F | Resp 18 | Ht 70.0 in | Wt 282.8 lb

## 2023-03-28 DIAGNOSIS — Z01818 Encounter for other preprocedural examination: Secondary | ICD-10-CM

## 2023-03-28 DIAGNOSIS — E119 Type 2 diabetes mellitus without complications: Secondary | ICD-10-CM

## 2023-03-28 DIAGNOSIS — D508 Other iron deficiency anemias: Secondary | ICD-10-CM | POA: Insufficient documentation

## 2023-03-28 DIAGNOSIS — R7303 Prediabetes: Secondary | ICD-10-CM

## 2023-03-28 DIAGNOSIS — Z01812 Encounter for preprocedural laboratory examination: Secondary | ICD-10-CM | POA: Insufficient documentation

## 2023-03-28 LAB — CBC WITH DIFFERENTIAL/PLATELET
Abs Immature Granulocytes: 0.03 10*3/uL (ref 0.00–0.07)
Basophils Absolute: 0.1 10*3/uL (ref 0.0–0.1)
Basophils Relative: 1 %
Eosinophils Absolute: 0.3 10*3/uL (ref 0.0–0.5)
Eosinophils Relative: 5 %
HCT: 46.6 % — ABNORMAL HIGH (ref 36.0–46.0)
Hemoglobin: 15.2 g/dL — ABNORMAL HIGH (ref 12.0–15.0)
Immature Granulocytes: 1 %
Lymphocytes Relative: 27 %
Lymphs Abs: 1.8 10*3/uL (ref 0.7–4.0)
MCH: 28 pg (ref 26.0–34.0)
MCHC: 32.6 g/dL (ref 30.0–36.0)
MCV: 85.8 fL (ref 80.0–100.0)
Monocytes Absolute: 0.5 10*3/uL (ref 0.1–1.0)
Monocytes Relative: 8 %
Neutro Abs: 3.9 10*3/uL (ref 1.7–7.7)
Neutrophils Relative %: 58 %
Platelets: 190 10*3/uL (ref 150–400)
RBC: 5.43 MIL/uL — ABNORMAL HIGH (ref 3.87–5.11)
RDW: 13.7 % (ref 11.5–15.5)
WBC: 6.7 10*3/uL (ref 4.0–10.5)
nRBC: 0 % (ref 0.0–0.2)

## 2023-03-28 LAB — BASIC METABOLIC PANEL
Anion gap: 10 (ref 5–15)
BUN: 14 mg/dL (ref 6–20)
CO2: 27 mmol/L (ref 22–32)
Calcium: 9.4 mg/dL (ref 8.9–10.3)
Chloride: 97 mmol/L — ABNORMAL LOW (ref 98–111)
Creatinine, Ser: 0.88 mg/dL (ref 0.44–1.00)
GFR, Estimated: >60 mL/min (ref >60)
Glucose, Bld: 99 mg/dL (ref 70–99)
Potassium: 2.9 mmol/L — ABNORMAL LOW (ref 3.5–5.1)
Sodium: 134 mmol/L — ABNORMAL LOW (ref 135–145)

## 2023-03-28 LAB — POCT PREGNANCY, URINE: Preg Test, Ur: NEGATIVE

## 2023-03-28 MED ORDER — NA SULFATE-K SULFATE-MG SULF 17.5-3.13-1.6 GM/177ML PO SOLN: ORAL | 0 refills | Status: DC

## 2023-03-28 NOTE — Telephone Encounter (Signed)
Pt stopped by office after pre-op visit and wanted to change her prep to the Suprep because she said the jug was just too much for her to drink. Prep sent to the pharmacy and new instructions given to pt

## 2023-03-29 ENCOUNTER — Other Ambulatory Visit: Payer: Self-pay

## 2023-03-29 MED ORDER — POTASSIUM CHLORIDE CRYS ER 20 MEQ PO TBCR: 40.0000 meq | EXTENDED_RELEASE_TABLET | Freq: Every day | ORAL | 0 refills | Status: DC

## 2023-04-02 ENCOUNTER — Encounter (HOSPITAL_COMMUNITY)
Admission: RE | Disposition: A | Discharge status: Home / Self Care | Payer: Self-pay | Source: Ambulatory Visit | Attending: Internal Medicine

## 2023-04-02 ENCOUNTER — Other Ambulatory Visit: Payer: Self-pay

## 2023-04-02 ENCOUNTER — Encounter (HOSPITAL_COMMUNITY): Payer: Self-pay | Admitting: Internal Medicine

## 2023-04-02 ENCOUNTER — Ambulatory Visit (HOSPITAL_COMMUNITY)
Admission: RE | Admit: 2023-04-02 | Discharge: 2023-04-02 | Disposition: A | Discharge status: Home / Self Care | Payer: Managed Care, Other (non HMO) | Source: Ambulatory Visit | Attending: Internal Medicine | Admitting: Internal Medicine

## 2023-04-02 ENCOUNTER — Ambulatory Visit (HOSPITAL_BASED_OUTPATIENT_CLINIC_OR_DEPARTMENT_OTHER): Payer: Managed Care, Other (non HMO) | Admitting: Certified Registered Nurse Anesthetist

## 2023-04-02 ENCOUNTER — Ambulatory Visit (HOSPITAL_COMMUNITY): Payer: Managed Care, Other (non HMO) | Admitting: Certified Registered Nurse Anesthetist

## 2023-04-02 DIAGNOSIS — Z1211 Encounter for screening for malignant neoplasm of colon: Secondary | ICD-10-CM | POA: Insufficient documentation

## 2023-04-02 DIAGNOSIS — Z800 Family history of malignant neoplasm of digestive organs: Secondary | ICD-10-CM

## 2023-04-02 DIAGNOSIS — J45909 Unspecified asthma, uncomplicated: Secondary | ICD-10-CM | POA: Insufficient documentation

## 2023-04-02 DIAGNOSIS — I10 Essential (primary) hypertension: Secondary | ICD-10-CM | POA: Diagnosis not present

## 2023-04-02 DIAGNOSIS — K219 Gastro-esophageal reflux disease without esophagitis: Secondary | ICD-10-CM | POA: Diagnosis not present

## 2023-04-02 DIAGNOSIS — Z980 Intestinal bypass and anastomosis status: Secondary | ICD-10-CM | POA: Insufficient documentation

## 2023-04-02 DIAGNOSIS — E119 Type 2 diabetes mellitus without complications: Secondary | ICD-10-CM | POA: Insufficient documentation

## 2023-04-02 DIAGNOSIS — Z85038 Personal history of other malignant neoplasm of large intestine: Secondary | ICD-10-CM | POA: Diagnosis not present

## 2023-04-02 DIAGNOSIS — K643 Fourth degree hemorrhoids: Secondary | ICD-10-CM | POA: Diagnosis not present

## 2023-04-02 DIAGNOSIS — Z86010 Personal history of colonic polyps: Secondary | ICD-10-CM

## 2023-04-02 DIAGNOSIS — K621 Rectal polyp: Secondary | ICD-10-CM | POA: Insufficient documentation

## 2023-04-02 DIAGNOSIS — K649 Unspecified hemorrhoids: Secondary | ICD-10-CM | POA: Diagnosis not present

## 2023-04-02 DIAGNOSIS — Z6841 Body Mass Index (BMI) 40.0 and over, adult: Secondary | ICD-10-CM | POA: Diagnosis not present

## 2023-04-02 DIAGNOSIS — F419 Anxiety disorder, unspecified: Secondary | ICD-10-CM | POA: Diagnosis not present

## 2023-04-02 DIAGNOSIS — R519 Headache, unspecified: Secondary | ICD-10-CM | POA: Insufficient documentation

## 2023-04-02 HISTORY — PX: POLYPECTOMY: SHX5525

## 2023-04-02 HISTORY — PX: COLONOSCOPY WITH PROPOFOL: SHX5780

## 2023-04-02 LAB — GLUCOSE, CAPILLARY: Glucose-Capillary: 120 mg/dL — ABNORMAL HIGH (ref 70–99)

## 2023-04-02 SURGERY — COLONOSCOPY WITH PROPOFOL
Anesthesia: General

## 2023-04-02 MED ORDER — PROPOFOL 500 MG/50ML IV EMUL
INTRAVENOUS | Status: DC | PRN
  Administered 2023-04-02: 150 ug/kg/min via INTRAVENOUS

## 2023-04-02 MED ORDER — PROPOFOL 500 MG/50ML IV EMUL
INTRAVENOUS | Status: AC
  Filled 2023-04-02: qty 50

## 2023-04-02 MED ORDER — PROPOFOL 10 MG/ML IV BOLUS
INTRAVENOUS | Status: DC | PRN
  Administered 2023-04-02: 60 mg via INTRAVENOUS

## 2023-04-02 MED ORDER — LACTATED RINGERS IV SOLN: INTRAVENOUS | Status: DC

## 2023-04-02 NOTE — Anesthesia Procedure Notes (Signed)
Procedure Name: General with mask airway Date/Time: 04/02/2023 10:36 AM  Performed by: Cy Blamer, CRNAPre-anesthesia Checklist: Patient identified, Emergency Drugs available, Suction available, Patient being monitored and Timeout performed Patient Re-evaluated:Patient Re-evaluated prior to induction Oxygen Delivery Method: Nasal cannula Preoxygenation: Pre-oxygenation with 100% oxygen Induction Type: IV induction Placement Confirmation: positive ETCO2 Dental Injury: Teeth and Oropharynx as per pre-operative assessment

## 2023-04-02 NOTE — Op Note (Signed)
[facility] Patient Name: Shelly Larson Procedure Date: 04/02/2023 10:14 AM MRN: [id number] Date of Birth: Jun 15, 1971 Attending MD: Gennette Pac , MD, [id number] CSN: [id number] Age: 52 Admit Type: Outpatient Procedure:                Colonoscopy Indications:              High risk colon cancer surveillance: Personal                            history of colon cancer Providers:                Gennette Pac, MD, Sheran Fava, Hetty Ely, Technician Referring MD:              Medicines:                Propofol per Anesthesia Complications:            No immediate complications. Estimated Blood Loss:     Estimated blood loss: none. Procedure:                Pre-Anesthesia Assessment:                           - Prior to the procedure, a History and Physical                            was performed, and patient medications and                            allergies were reviewed. The patient's tolerance of                            previous anesthesia was also reviewed. The risks                            and benefits of the procedure and the sedation                            options and risks were discussed with the patient.                            All questions were answered, and informed consent                            was obtained. Prior Anticoagulants: The patient has                            taken no anticoagulant or antiplatelet agents. ASA                            Grade Assessment: III - A patient with severe  systemic disease. After reviewing the risks and                            benefits, the patient was deemed in satisfactory                            condition to undergo the procedure.                           After obtaining informed consent, the colonoscope                            was passed under direct vision. Throughout the                            procedure,  the patient's blood pressure, pulse, and                            oxygen saturations were monitored continuously. The                            [id number]) scope was introduced through the                            anus and advanced to the the cecum, identified by                            appendiceal orifice and ileocecal valve. The                            colonoscopy was performed without difficulty. The                            patient tolerated the procedure well. The quality                            of the bowel preparation was adequate. The                            ileocecal valve, appendiceal orifice, and rectum                            were photographed. The entire colon was well                            visualized. Scope In: 10:39:12 AM Scope Out: 11:05:12 AM Scope Withdrawal Time: 0 hours 22 minutes 57 seconds  Total Procedure Duration: 0 hours 26 minutes 0 seconds  Findings:      The perianal and digital rectal examinations revealed grade 4       hemorrhoids only. 1 cm polypoid lesion in the distal rectum seen on face       and retroflex. Some appearance of anal papilla but also some adenomatous       change on the head of this lesion.  Surgical anastomosis mid colon. The residual colonic mucosa otherwise       appeared normal. The polyp in the distal rectum was hot snared. Impression:               - Status post segmental resection of her colon.                            Distal rectal pedunculated polyp - status post hot                            snare removal. Remainder of colonic mucosa appeared                            normal.                           -Grade 4 hemorrhoids. Moderate Sedation:      Moderate (conscious) sedation was personally administered by an       anesthesia professional. The following parameters were monitored: oxygen       saturation, heart rate, blood pressure, respiratory rate, EKG, adequacy       of pulmonary  ventilation, and response to care. Recommendation:           - Patient has a contact number available for                            emergencies. The signs and symptoms of potential                            delayed complications were discussed with the                            patient. Return to normal activities tomorrow.                            Written discharge instructions were provided to the                            patient.                           - Advance diet as tolerated. Colace 100 mg twice                            daily x 5 days follow-up on pathology. Further                            recommendations to follow. Procedure Code(s):        --- Professional ---                           [phone], Colonoscopy, flexible; diagnostic, including                            collection of specimen(s) by brushing or washing,  when performed (separate procedure) Diagnosis Code(s):        --- Professional ---                           A44.975, Personal history of other malignant                            neoplasm of large intestine CPT copyright 2022 American Medical Association. All rights reserved. The codes documented in this report are preliminary and upon coder review may  be revised to meet current compliance requirements. Gerrit Friends. Chasty Randal, MD Gennette Pac, MD 04/02/2023 11:34:18 AM This report has been signed electronically. Number of Addenda: 0

## 2023-04-02 NOTE — Interval H&P Note (Signed)
History and Physical Interval Note:  04/02/2023 10:28 AM  Shelly Larson  has presented today for surgery, with the diagnosis of history of colon cancer, history of polypsc.  The various methods of treatment have been discussed with the patient and family. After consideration of risks, benefits and other options for treatment, the patient has consented to  Procedure(s) with comments: COLONOSCOPY WITH PROPOFOL (N/A) - 12:15 pm as a surgical intervention.  The patient's history has been reviewed, patient examined, no change in status, stable for surgery.  I have reviewed the patient's chart and labs.  Questions were answered to the patient's satisfaction.     [name]    no change.  Surveillance colonoscopy per plan. .rmr

## 2023-04-02 NOTE — Discharge Instructions (Signed)
  Colonoscopy Discharge Instructions  Read the instructions outlined below and refer to this sheet in the next few weeks. These discharge instructions provide you with general information on caring for yourself after you leave the hospital. Your doctor may also give you specific instructions. While your treatment has been planned according to the most current medical practices available, unavoidable complications occasionally occur. If you have any problems or questions after discharge, call Dr. Jena Gauss at [phone]. ACTIVITY You may resume your regular activity, but move at a slower pace for the next 24 hours.  Take frequent rest periods for the next 24 hours.  Walking will help get rid of the air and reduce the bloated feeling in your belly (abdomen).  No driving for 24 hours (because of the medicine (anesthesia) used during the test).   Do not sign any important legal documents or operate any machinery for 24 hours (because of the anesthesia used during the test).  NUTRITION Drink plenty of fluids.  You may resume your normal diet as instructed by your doctor.  Begin with a light meal and progress to your normal diet. Heavy or fried foods are harder to digest and may make you feel sick to your stomach (nauseated).  Avoid alcoholic beverages for 24 hours or as instructed.  MEDICATIONS You may resume your normal medications unless your doctor tells you otherwise.  WHAT YOU CAN EXPECT TODAY Some feelings of bloating in the abdomen.  Passage of more gas than usual.  Spotting of blood in your stool or on the toilet paper.  IF YOU HAD POLYPS REMOVED DURING THE COLONOSCOPY: No aspirin products for 7 days or as instructed.  No alcohol for 7 days or as instructed.  Eat a soft diet for the next 24 hours.  FINDING OUT THE RESULTS OF YOUR TEST Not all test results are available during your visit. If your test results are not back during the visit, make an appointment with your caregiver to find out the  results. Do not assume everything is normal if you have not heard from your caregiver or the medical facility. It is important for you to follow up on all of your test results.  SEEK IMMEDIATE MEDICAL ATTENTION IF: You have more than a spotting of blood in your stool.  Your belly is swollen (abdominal distention).  You are nauseated or vomiting.  You have a temperature over 101.  You have abdominal pain or discomfort that is severe or gets worse throughout the day.      1 polyp in your rectum was removed.  Take a stool softener like Colace 100 mg twice daily for 5 days.  Avoid constipation.    Further recommendations to follow pending review of pathology report  At patient request, I called Pat Patrick at [phone]

## 2023-04-02 NOTE — Transfer of Care (Signed)
Immediate Anesthesia Transfer of Care Note  Patient: Shelly Larson  Procedure(s) Performed: COLONOSCOPY WITH PROPOFOL POLYPECTOMY  Patient Location: Short Stay  Anesthesia Type:General  Level of Consciousness: drowsy, patient cooperative, and responds to stimulation  Airway & Oxygen Therapy: Patient Spontanous Breathing  Post-op Assessment: Report given to RN, Post -op Vital signs reviewed and stable, Patient moving all extremities X 4, and Patient able to stick tongue midline  Post vital signs: Reviewed  Last Vitals:  Vitals Value Taken Time  BP 109/58 04/02/23 1109  Temp 36.5 C 04/02/23 1109  Pulse 72 04/02/23 1109  Resp 30   SpO2 94 % 04/02/23 1109    Last Pain:  Vitals:   04/02/23 1109  TempSrc: Oral  PainSc:       Patients Stated Pain Goal: 4 (04/02/23 1018)  Complications: No notable events documented.

## 2023-04-02 NOTE — Anesthesia Preprocedure Evaluation (Signed)
Anesthesia Evaluation  Patient identified by MRN, date of birth, ID band Patient awake    Reviewed: Allergy & Precautions, H&P , NPO status , Patient's Chart, lab work & pertinent test results, reviewed documented beta blocker date and time   Airway Mallampati: II  TM Distance: >3 FB Neck ROM: full    Dental no notable dental hx.    Pulmonary neg pulmonary ROS, shortness of breath, asthma    Pulmonary exam normal breath sounds clear to auscultation       Cardiovascular Exercise Tolerance: Good hypertension, negative cardio ROS  Rhythm:regular Rate:Normal     Neuro/Psych  Headaches PSYCHIATRIC DISORDERS Anxiety     negative neurological ROS  negative psych ROS   GI/Hepatic negative GI ROS, Neg liver ROS,GERD  ,,(+) Hepatitis -  Endo/Other  diabetes  Morbid obesity  Renal/GU Renal diseasenegative Renal ROS  negative genitourinary   Musculoskeletal   Abdominal   Peds  Hematology negative hematology ROS (+) Blood dyscrasia, anemia   Anesthesia Other Findings   Reproductive/Obstetrics negative OB ROS                             Anesthesia Physical Anesthesia Plan  ASA: 3  Anesthesia Plan: General   Post-op Pain Management:    Induction:   PONV Risk Score and Plan: Propofol infusion  Airway Management Planned:   Additional Equipment:   Intra-op Plan:   Post-operative Plan:   Informed Consent: I have reviewed the patients History and Physical, chart, labs and discussed the procedure including the risks, benefits and alternatives for the proposed anesthesia with the patient or authorized representative who has indicated his/her understanding and acceptance.     Dental Advisory Given  Plan Discussed with: CRNA  Anesthesia Plan Comments:        Anesthesia Quick Evaluation

## 2023-04-03 LAB — SURGICAL PATHOLOGY

## 2023-04-03 NOTE — Anesthesia Postprocedure Evaluation (Signed)
Anesthesia Post Note  Patient: Shelly Larson  Procedure(s) Performed: COLONOSCOPY WITH PROPOFOL POLYPECTOMY  Patient location during evaluation: Phase II Anesthesia Type: General Level of consciousness: awake Pain management: pain level controlled Vital Signs Assessment: post-procedure vital signs reviewed and stable Respiratory status: spontaneous breathing and respiratory function stable Cardiovascular status: blood pressure returned to baseline and stable Postop Assessment: no headache and no apparent nausea or vomiting Anesthetic complications: no Comments: Late entry   No notable events documented.   Last Vitals:  Vitals:   04/02/23 1109 04/02/23 1112  BP: (!) 109/58 108/67  Pulse: 72 85  Resp:  20  Temp: 36.5 C   SpO2: 94% 95%    Last Pain:  Vitals:   04/02/23 1112  TempSrc:   PainSc: 0-No pain                 Windell Norfolk

## 2023-04-04 ENCOUNTER — Encounter: Payer: Self-pay | Admitting: Internal Medicine

## 2023-04-06 ENCOUNTER — Encounter: Payer: Self-pay | Admitting: Dietician

## 2023-04-06 ENCOUNTER — Encounter: Payer: Managed Care, Other (non HMO) | Attending: General Surgery | Admitting: Dietician

## 2023-04-06 VITALS — Ht 70.0 in | Wt 282.6 lb

## 2023-04-06 DIAGNOSIS — E669 Obesity, unspecified: Secondary | ICD-10-CM | POA: Insufficient documentation

## 2023-04-06 DIAGNOSIS — E119 Type 2 diabetes mellitus without complications: Secondary | ICD-10-CM | POA: Diagnosis present

## 2023-04-06 NOTE — Progress Notes (Signed)
Nutrition Assessment for Bariatric Surgery: Pre-Surgery Behavioral and Nutrition Intervention Program   Medical Nutrition Therapy  Appt Start Time: 8:42    End Time: 9:53  Patient was seen on 04/06/2023 for Pre-Operative Nutrition Assessment. Purpose of todays visit  enhance perioperative outcomes along with a healthy weight maintenance   Referral stated Supervised Weight Loss (SWL) visits needed: 0  Pt completed visits.   Pt has cleared nutrition requirements.   Planned surgery: undecided Pt expectation of surgery: to lose at least 100 lbs   NUTRITION ASSESSMENT   Anthropometrics  Start weight at [facility]: 282.6 lbs (date: 04/06/2023)  Height: 70 in BMI: 40.55 kg/m2     Clinical   Pharmacotherapy: History of weight loss medication used: phentermine  Medical hx: cancer, asthma, obesity, HTN Medications: adderall, amlodipine, losartan, linzess, omeprazole  Labs: glucose 103; RBC 5.32 Notable signs/symptoms: none noted Any previous deficiencies? No  Evaluation of Nutritional Deficiencies: Micronutrient Nutrition Focused Physical Exam: Hair: No issues observed Eyes: No issues observed Mouth: No issues observed Neck: No issues observed Nails: No issues observed Skin: No issues observed  Lifestyle & Dietary Hx  Pt states she is undecided which procedure/surgery, stating due to resection of intestines, based on tissue damage.  Pt states she is allergic to lisinopril. Pt states she is taking vitamin D, calcium Pt states she takes adderall and will need to be more mindful of drinking and eating during the day. 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  Current Physical Activity Recommendations state 150 minutes per week of moderate to vigorous movement including Cardio and 1-2 days of resistance activities as well as flexibility/balance activities:  Pts current physical activity: ADL, with 0% recommendation  reached   Sleep Hygiene: duration and quality: varies, 4-5 hours a night  Current Patient Perceived Stress Level as stated by pt on a scale of 1-10: 3       Stress Management Techniques: breathing, step away, take a walk  According to the Dietary Guidelines for Americans Recommendation: equivalent 1.5-2 cups fruits per day, equivalent 2-3 cups vegetables per day and at least half all grains whole  Fruit servings per day (on average): 2-3, meeting 100% recommendation  Non-starchy vegetable servings per day (on average): 2-3, meeting 100% recommendation  Whole Grains per day (on average): 1  Number of meals missed/skipped per week out of 21: 5  24-Hr Dietary Recall First Meal: grain bar or egg mc muffin on way to work Snack: fruit or nuts Second Meal: wrap Snack: fruit or nuts Third Meal: meat, vegetables, with rice or pasta or potato Snack: fruit or nuts Beverages: pepsi, tea, clearly canadian, Izzi, juice, sparkling water  Alcoholic beverages per week: 0 (once in awhile)   Estimated Energy Needs Calories: 1600  NUTRITION DIAGNOSIS  Overweight/obesity ([place]-3.3) related to past poor dietary habits and physical inactivity as evidenced by patient w/ planned (undetermined) surgery following dietary guidelines for continued weight loss.    NUTRITION INTERVENTION  Nutrition counseling (C-1) and education (E-2) to facilitate bariatric surgery goals.  Educated pt on micronutrient deficiencies post-surgery and behavioral/dietary strategies to start in order to mitigate that risk   Behavioral and Dietary Interventions Pre-Op Goals Reviewed with the Patient Nutrition: Healthy Eating Behaviors Switch to non-caloric, non-carbonated and non-caffeinated beverages such as  water, unsweetened tea, Crystal Light and zero calorie beverages (aim for 64 oz. per day) Cut out grazing between meals or at night  Find a protein  shake you like Eat every 3-5 hours        Eliminate distractions while  eating (TV, computer, reading, driving, texting) Take 40-98 minutes to eat a meal  Decrease high sugar foods/decrease high fat/fried foods Eliminate alcoholic beverages Increase protein intake (eggs, fish, chicken, yogurt) before surgery Eat non starchy vegetables 2 times a day 7 days a week Eat complex carbohydrates such as whole grains and fruits   Behavioral Modification: Physical Activity Increase my usual daily activity (use stairs, park farther, etc.) Engage in _______________________  activity  _______ minutes ______ times per week  Other:    _________________________________________________________________     Problem Solving I will think about my usual eating patterns and how to tweak them How can my friends and family support me Barriers to starting my changes Learn and understand appetite verses hunger   Healthy Coping Allow for ___________ activities per week to help me manage stress Reframe negative thoughts I will keep a picture of someone or something that is my inspiration & look at it daily   Monitoring  Weigh myself once a week  Measure my progress by monitoring how my clothes fit Keep a food record of what I eat and drink for the next ________ (time period) Take pictures of what I eat and drink for the next ________ (time period) Use an app to count steps/day for the next_______ (time period) Measure my progress such as increased energy and more restful sleep Monitor your acid reflux and bowel habits, are they getting better?   *Goals that are bolded indicate the pt would like to start working towards these  Handouts Provided Include  Bariatric Surgery handouts (Nutrition Visits, Pre Surgery Behavioral Change Goals, Protein Shakes Brands to Choose From, Vitamins & Mineral Supplementation)  Learning Style & Readiness for Change Teaching method utilized: Visual, Auditory, and hands on  Demonstrated degree of understanding via: Teach Back  Readiness Level:  preparation Barriers to learning/adherence to lifestyle change: nothing identified  RD's Notes for Next Visit   MONITORING & EVALUATION Dietary intake, weekly physical activity, body weight, and preoperative behavioral change goals   Next Steps  Pt has completed visits. No further supervised visits required/recommended. Patient is to follow up at [facility] for pre-op class, > 2 weeks prior to scheduled surgery.

## 2023-04-10 ENCOUNTER — Ambulatory Visit (HOSPITAL_BASED_OUTPATIENT_CLINIC_OR_DEPARTMENT_OTHER): Payer: Managed Care, Other (non HMO) | Admitting: Cardiology

## 2023-04-10 ENCOUNTER — Encounter (HOSPITAL_COMMUNITY): Payer: Self-pay | Admitting: Internal Medicine

## 2023-04-10 VITALS — BP 130/82 | HR 72 | Ht 70.0 in | Wt 282.1 lb

## 2023-04-10 DIAGNOSIS — I517 Cardiomegaly: Secondary | ICD-10-CM

## 2023-04-10 DIAGNOSIS — Z01810 Encounter for preprocedural cardiovascular examination: Secondary | ICD-10-CM | POA: Diagnosis not present

## 2023-04-10 DIAGNOSIS — I10 Essential (primary) hypertension: Secondary | ICD-10-CM | POA: Diagnosis not present

## 2023-04-10 DIAGNOSIS — R9431 Abnormal electrocardiogram [ECG] [EKG]: Secondary | ICD-10-CM | POA: Diagnosis not present

## 2023-04-10 NOTE — Progress Notes (Deleted)
Planned surgery: bariatric surgery, date TBD, Dr Andrey Campanile  Pertinent past cardiac history: none other than hypertension Prior cardiac workup: echo 2018 History of valve disease: none History of CAD/PAD/CVA/TIA: none History of heart failure: none History of arrhythmia: none On anticoagulation: none History of hypertension: since 2018 History of diabetes: no formal diabetes, last A1c 6.3 History of CKD: none History of OSA: no history History of anesthesia complications: none

## 2023-04-10 NOTE — Patient Instructions (Signed)
Medication Instructions:  No changes  Lab Work: None  Testing/Procedures: We will get an echocardiogram (ultrasound) to take a look at the muscle of your heart.   Follow-Up: At [facility], you and your health needs are our priority.  As part of our continuing mission to provide you with exceptional heart care, we have created designated Provider Care Teams.  These Care Teams include your primary Cardiologist (physician) and Advanced Practice Providers (APPs -  Physician Assistants and Nurse Practitioners) who all work together to provide you with the care you need, when you need it.  We recommend signing up for the patient portal called "MyChart".  Sign up information is provided on this After Visit Summary.  MyChart is used to connect with patients for Virtual Visits (Telemedicine).  Patients are able to view lab/test results, encounter notes, upcoming appointments, etc.  Non-urgent messages can be sent to your provider as well.   To learn more about what you can do with MyChart, go to [email or web address].    Your next appointment:   As needed  Provider:   Jodelle Red, MD    Other Instructions Good luck with surgery!

## 2023-04-10 NOTE — Progress Notes (Signed)
Cardiology Office Note:    Date:  04/10/2023   ID:  Ivin Booty, DOB 13-May-1971, MRN [id number]  PCP:  Bary Leriche, PA-C  Cardiologist:  Jodelle Red, MD  Referring MD: Bary Leriche, PA-C   CC: new patient evaluation for preoperative cardiovascular clearance  History of Present Illness:    Shelly Larson is a 52 y.o. female with a hx of hypertension, GERD, DM, Colon cancer and arthritis who is seen as a new consult at the request of Allwardt, Alyssa M, PA-C for preoperative clearance  Today, the patient states that she believes her blood pressure in clinic of 130/82 to be a fluke as it is usually higher. She is getting gastric bypass surgery soon. She has never had any cardiac issues but she has been on high blood pressure and migraine medication since 2018. She is prediabetic which she states runs in her family. She has occasional shortness of breath which she attributes to asthma, especially in the winter. She recounts her most strenuous activity recently as walking during work and notes no limitation with her walking. She is able to walk up a hill or up stairs, she is able to run, move furniture. She has good functional capacity. Her sister had a hemorrhagic stroke a few years ago and she is just now starting to normalize.  She denies any palpitations, chest pain, or peripheral edema. No lightheadedness, headaches, syncope, orthopnea, or PND.  Planned surgery: bariatric surgery, date TBD, Dr Andrey Campanile  Pertinent past cardiac history: none other than hypertension Prior cardiac workup: echo 2018 History of valve disease: none History of CAD/PAD/CVA/TIA: none History of heart failure: none History of arrhythmia: none On anticoagulation: none History of hypertension: since 2018 History of diabetes: no formal diabetes, last A1c 6.3 History of CKD: none History of OSA: no history History of anesthesia complications: none  Past Medical History:  Diagnosis Date    ADHD    Allergies    Anemia    Arthritis    Asthma    Biliary acute pancreatitis 2010   Colon cancer 2016   Transverse colon    Diabetes mellitus without complication    Dyspnea    GERD (gastroesophageal reflux disease)    Hypertension    Hypertension    Migraine     Past Surgical History:  Procedure Laterality Date   ABLATION     BAND HEMORRHOIDECTOMY     BIOPSY  09/02/2021   Procedure: BIOPSY;  Surgeon: Lanelle Bal, DO;  [facility]: [facility];  Service: Endoscopy;;   BREAST REDUCTION SURGERY Bilateral    CHOLECYSTECTOMY  2010   [place], biliary pancreatitis   COLECTOMY  09/30/2015   distal transverse d/t adenocarcinoma   COLONOSCOPY N/A 06/11/2018   Procedure: COLONOSCOPY;  Surgeon: Corbin Ade, MD;  [facility]: [facility];  Service: Endoscopy;  Laterality: N/A;  1:45pm   COLONOSCOPY WITH PROPOFOL N/A 04/02/2023   Procedure: COLONOSCOPY WITH PROPOFOL;  Surgeon: Corbin Ade, MD;  [facility];  Service: Endoscopy;  Laterality: N/A;  12:15 pm   DILITATION & CURRETTAGE/HYSTROSCOPY WITH NOVASURE ABLATION N/A 09/10/2018   Procedure: DILATATION & CURETTAGE/HYSTEROSCOPY WITH MINERVA  ENDOMETRIAL ABLATION;  Surgeon: Tilda Burrow, MD;  Location: [facility];  Service: Gynecology;  Laterality: N/A;   ESOPHAGOGASTRODUODENOSCOPY (EGD) WITH PROPOFOL N/A 09/02/2021   Procedure: ESOPHAGOGASTRODUODENOSCOPY (EGD) WITH PROPOFOL;  Surgeon: Lanelle Bal, DO;  [facility];  Service: Endoscopy;  Laterality: N/A;   POLYPECTOMY  06/11/2018  Procedure: POLYPECTOMY;  Surgeon: Corbin Ade, MD;  [facility];  Service: Endoscopy;;  colon   POLYPECTOMY  04/02/2023   Procedure: POLYPECTOMY;  Surgeon: Corbin Ade, MD;  [facility];  Service: Endoscopy;;    Current Medications: Current Outpatient Medications on File Prior to Visit  Medication Sig   albuterol (VENTOLIN HFA) 108 (90 Base) MCG/ACT inhaler Inhale 2 puffs into the  lungs every 4 (four) hours as needed for wheezing or shortness of breath.    amitriptyline (ELAVIL) 25 MG tablet Take 2 tablets (50 mg total) by mouth at bedtime.   amLODipine (NORVASC) 10 MG tablet Take 1 tablet (10 mg total) by mouth daily.   amphetamine-dextroamphetamine (ADDERALL XR) 30 MG 24 hr capsule Take 1 capsule (30 mg total) by mouth every morning. (Patient taking differently: Take 30 mg by mouth in the morning.)   amphetamine-dextroamphetamine (ADDERALL) 15 MG tablet Take 1 tab po around 2 pm (Patient taking differently: Take 15 mg by mouth daily as needed (in the afternoon as needed for focus/attention.).)   Calcium Carb-Cholecalciferol (CALTRATE 600+D3 PO) Take 1 tablet by mouth in the morning.   clonazePAM (KLONOPIN) 0.5 MG tablet Take 1 tablet (0.5 mg total) by mouth 2 (two) times daily as needed for anxiety.   hydrocortisone (ANUSOL-HC) 2.5 % rectal cream Place 1 Application rectally 2 (two) times daily. (Patient taking differently: Place 1 Application rectally 2 (two) times daily as needed (discomfort/irritation.).)   linaclotide (LINZESS) 290 MCG CAPS capsule Take 1 capsule (290 mcg total) by mouth daily before breakfast.   losartan-hydrochlorothiazide (HYZAAR) 100-25 MG tablet TAKE 1 TABLET BY MOUTH EVERY DAY   metoprolol succinate (TOPROL-XL) 50 MG 24 hr tablet Take 50 mg by mouth every morning.    Na Sulfate-K Sulfate-Mg Sulf 17.5-3.13-1.6 GM/177ML SOLN As directed   omeprazole (PRILOSEC) 40 MG capsule Take 1 capsule (40 mg total) by mouth daily. (Patient taking differently: Take 40 mg by mouth every evening.)   polyethylene glycol-electrolytes (NULYTELY) 420 g solution Take 4,000 mLs by mouth as directed.   potassium chloride SA (KLOR-CON M) 20 MEQ tablet Take 2 tablets (40 mEq total) by mouth daily for 5 days.   promethazine (PHENERGAN) 12.5 MG tablet Take 1 tablet (12.5 mg total) by mouth every 6 (six) hours as needed for nausea or vomiting.   Rhubarb (ESTROVEN MENOPAUSE  RELIEF PO) Take 1 tablet by mouth daily in the afternoon.   Rimegepant Sulfate (NURTEC) 75 MG TBDP Take 75 mg by mouth daily as needed.   No current facility-administered medications on file prior to visit.     Allergies:   Lisinopril   Social History   Tobacco Use   Smoking status: Never   Smokeless tobacco: Never  Vaping Use   Vaping Use: Never used  Substance Use Topics   Alcohol use: Yes    Comment: rare   Drug use: No    Family History: family history includes Breast cancer in her paternal aunt and paternal grandmother; Diabetes Mellitus II in her mother; Heart disease in her sister; Hypertension in her mother. There is no history of Colon cancer or Liver disease.  ROS:   Please see the history of present illness.   (+)SOB Additional pertinent ROS: Constitutional: Negative for chills, fever, night sweats, unintentional weight loss  HENT: Negative for ear pain and hearing loss.   Eyes: Negative for loss of vision and eye pain.  Respiratory: Negative for cough, sputum, wheezing.   Cardiovascular: See HPI.  Gastrointestinal: Negative for abdominal pain, melena, and hematochezia.  Genitourinary: Negative for dysuria and hematuria.  Musculoskeletal: Negative for falls and myalgias.  Skin: Negative for itching and rash.  Neurological: Negative for focal weakness, focal sensory changes and loss of consciousness.  Endo/Heme/Allergies: Does not bruise/bleed easily.     EKGs/Labs/Other Studies Reviewed:    The following studies were reviewed today: Echo 2018 reviewed  EKG:  EKG is personally reviewed.   04/10/23: NSR at 82 bpm, borderline LVH with repol  Recent Labs: 08/17/2022: ALT 12; TSH 1.42 03/28/2023: BUN 14; Creatinine, Ser 0.88; Hemoglobin 15.2; Platelets 190; Potassium 2.9; Sodium 134  Recent Lipid Panel    Component Value Date/Time   CHOL 154 08/17/2022 1446   TRIG 120.0 08/17/2022 1446   HDL 48.00 08/17/2022 1446   CHOLHDL 3 08/17/2022 1446   VLDL 24.0  08/17/2022 1446   LDLCALC 82 08/17/2022 1446    Physical Exam:    VS:  BP 130/82 (BP Location: Left Arm, Patient Position: Sitting, Cuff Size: Large)   Pulse 72   Ht 5\' 10"  (1.778 m)   Wt 282 lb 1.6 oz (128 kg)   BMI 40.48 kg/m     Wt Readings from Last 3 Encounters:  04/06/23 282 lb 9.6 oz (128.2 kg)  03/28/23 282 lb 12.8 oz (128.3 kg)  03/19/23 282 lb 12.8 oz (128.3 kg)    GEN: Well nourished, well developed in no acute distress HEENT: Normal, moist mucous membranes NECK: No JVD CARDIAC: regular rhythm, normal S1 and S2, no rubs or gallops. No murmur. VASCULAR: Radial and DP pulses 2+ bilaterally. No carotid bruits RESPIRATORY:  Clear to auscultation without rales, wheezing or rhonchi  ABDOMEN: Soft, non-tender, non-distended MUSCULOSKELETAL:  Ambulates independently SKIN: Warm and dry, no edema NEUROLOGIC:  Alert and oriented x 3. No focal neuro deficits noted. PSYCHIATRIC:  Normal affect    ASSESSMENT:    1. Preop cardiovascular exam   2. Abnormal ECG   3. Essential hypertension   4. Morbid obesity (HCC)   5. LVH (left ventricular hypertrophy)    PLAN:    Preoperative cardiovascular exam According to the Revised Cardiac Risk Index (RCRI), her Perioperative Risk of Major Cardiac Event is (%): 0.4  Her Functional Capacity in METs is: 8.33 according to the Duke Activity Status Index (DASI).  The patient is not currently having active cardiac symptoms, and they can achieve >4 METs of activity.  According to ACC/AHA Guidelines, no further testing is needed.  Proceed with surgery at acceptable risk.  Our service is available as needed in the peri-operative period.     Hypertension Morbid obesity -anticipate both will improve with gastric bypass surgery  LVH -will get echo for further evaluation  Cardiac risk counseling and prevention recommendations: -recommend heart healthy/Mediterranean diet, with whole grains, fruits, vegetable, fish, lean meats, nuts, and  olive oil. Limit salt. -recommend moderate walking, 3-5 times/week for 30-50 minutes each session. Aim for at least 150 minutes.week. Goal should be pace of 3 miles/hours, or walking 1.5 miles in 30 minutes -recommend avoidance of tobacco products. Avoid excess alcohol. -ASCVD risk score: The 10-year ASCVD risk score (Arnett DK, et al., 2019) is: 7%   Values used to calculate the score:     Age: 87 years     Sex: Female     Is Non-Hispanic African American: Yes     Diabetic: Yes     Tobacco smoker: Yes     Systolic Blood Pressure: 108 mmHg  Is BP treated: Yes     HDL Cholesterol: 48 mg/dL     Total Cholesterol: 154 mg/dL    Plan for follow up: to be determined based on echo  Jodelle Red, MD, PhD, [facility] [place]  [facility]  [place]  [facility] at [facility] [address] [place], [place] [address] [phone]   Medication Adjustments/Labs and Tests Ordered: Current medicines are reviewed at length with the patient today.  Concerns regarding medicines are outlined above.  Orders Placed This Encounter  Procedures   EKG 12-Lead   ECHOCARDIOGRAM COMPLETE   No orders of the defined types were placed in this encounter.   Patient Instructions  Medication Instructions:  No changes  Lab Work: None  Testing/Procedures: We will get an echocardiogram (ultrasound) to take a look at the muscle of your heart.   Follow-Up: At [facility], you and your health needs are our priority.  As part of our continuing mission to provide you with exceptional heart care, we have created designated Provider Care Teams.  These Care Teams include your primary Cardiologist (physician) and Advanced Practice Providers (APPs -  Physician Assistants and Nurse Practitioners) who all work together to provide you with the care you need, when you need it.  We recommend signing up for the patient portal called  "MyChart".  Sign up information is provided on this After Visit Summary.  MyChart is used to connect with patients for Virtual Visits (Telemedicine).  Patients are able to view lab/test results, encounter notes, upcoming appointments, etc.  Non-urgent messages can be sent to your provider as well.   To learn more about what you can do with MyChart, go to [email or web address].    Your next appointment:   As needed  Provider:   Jodelle Red, MD    Other Instructions Good luck with surgery!    I,Coren O'Brien,acting as a Neurosurgeon for [employer], MD.,have documented all relevant documentation on the behalf of Jodelle Red, MD,as directed by  Jodelle Red, MD while in the presence of Jodelle Red, MD.  I, Jodelle Red, MD, have reviewed all documentation for this visit. The documentation on 05/11/23 for the exam, diagnosis, procedures, and orders are all accurate and complete.

## 2023-05-01 ENCOUNTER — Telehealth: Payer: Self-pay | Admitting: Anesthesiology

## 2023-05-01 NOTE — Telephone Encounter (Signed)
Pt called starting she is having a really bad migraine, started today, took nurtec around 10 am, has vomited 2 times, head feels heavy and has left eye throbbing. States can something else be called for her. [facility] [address], [place].

## 2023-05-02 NOTE — Telephone Encounter (Signed)
Pt called in and left a message with the access nurse on 05/01/23. She states she has a HA that started this morning around 8:00 AM and it was not bad, she took Nurtec at 9:45 and it helped some until 12:30, she had nausea and vomited 2 times. Rates pain 6/10, has not taken anything else for pain, has some floaters in eyes, nausea, is drinking and voiding, denies other symptoms.  Full access nurse report is in Dr. Adaline Sill box

## 2023-05-02 NOTE — Telephone Encounter (Signed)
Called pt and left message to call office.

## 2023-05-02 NOTE — Progress Notes (Signed)
NEUROLOGY FOLLOW UP OFFICE NOTE  HEART SCHREITER [id number]  Subjective:  Shelly Larson is a 52 y.o. year old right-handed female with a medical history of migraine, HTN, DM, hep B, ADHD, colon cancer, anxiety, OA, plantar fasciitis who we last saw on 01/18/23.  To briefly review: Patient has had headaches for a lot of her life. They went away when she was in her 22s. About 4-5 years ago, she began having headaches again. She was on metoprolol, other BP meds, and topirimate for prevention. She was on sumatriptan for abortive therapy. This was working until recently. She was getting 3-4 headaches per month at that time. The sumatriptan would make her headaches tolerable. Patient stopped taking topirimate about 1 year ago.    Over the last 2-3 months, she has had worsening headaches. She is having more frequent headaches. PCP switched patient rizatriptan 10 mg PRN. This does not help much. She has not identified a clear trigger.    The headaches patient has now are similar to headaches she had when she was younger. Most of the time the pain is behind both eyes. She describes a "banging, piercing sensation." She endorses photophobia, phonophobia, nausea, and vomiting. Movement and activity makes it worse. She also has aura (flashing lights, vision changes, floaters, dizziness like sensation). The headaches last about 1-1.5 days before dying down to a tolerable nagging pain. At the worst, the pain is 9/10. She has about 6-8 headaches per month. She always gets at least 1 every week, maybe 2-3. There is no positional component per patient. Her headaches slowly build to maximal intensity.   Patient has been having night sweats for at least 6-7 months and hot flashes for about a year. Patient had an ablation 2-3 years ago. She occasionally has occasional spotting that she calls her fake cycle. She gets a migraine when this happens. She is not sleeping well, maybe sleeping 3-4 hours a night because of hot  flashes, sweats, or headaches. She is not hormonal therapy.   She takes OTC about 3 doses per week and triptan 1-2 times per week (total of 5 doses per week, or 20 total doses per month).   Patient occasionally goes to the ED for acute treatment, about 6 times in the last year, due to headache that she cannot control. She last went to the ED around 06/2022.   Past NSAIDS/analgesics:  tylenol, excedrin, ibuprofen Past abortive triptans:  sumatriptan (no longer helping) Past muscle relaxants:  flexeril Past anti-emetic:  phenergan Past antihypertensive medications:  propranolol, metoprolol Past antidepressant medications:  elavil (helped a lot) Past anticonvulsant medications:  topirimate (thinks it may have helped) Past vitamins/Herbal/Supplements:  none Past antihistamines/decongestants:  bendryl   Current NSAIDS/analgesics:  tylenol, excedrin, ibuprofen Current triptans:  rizatriptan (not helping) Current ergotamine:  none Current anti-emetic:  none Current muscle relaxants:  none Current Antihypertensive medications:  metoprolol Current Antidepressant medications:  none Current Anticonvulsant medications:  none Current anti-CGRP:  none Current Vitamins/Herbal/Supplements:  none Current Antihistamines/Decongestants:  bendryl Hormone/birth control:  none   Caffeine:  occasional Alcohol:  occasional wine Smoker:  no Diet:  normal american diet Exercise:  walks 3-4 times per week (about 1 mile); gets a lot of steps in daily Depression:  none; Anxiety:  gets panic attacks and anxiety (on adderall) Other pain:  gets a pain from the neck; getting worse of late Family history of headache:  sister had migraines  01/18/23: Nurtec was approved on 10/31/22. Patient was unaware and  did not ever get this medication.   She was diagnosed with COVID on 11/01/22. Her headaches have been worse since COVID. She had very severe headaches during COVID. She describes the headaches similar to prior,  pain behind eyes, banging, piercing sensation with photophobia, phonophobia, and nausea and vomiting. The headaches are more likely with weather changes, cold weather, or pressure changes. She is having about 5 headaches per week currently (~20 days of headache per month compared to ~16 days of headache on 10/27/22).   Patient started Elavil 25 mg qhs. She also had rizatriptan but it did not work. She takes phenergan for nausea.   Vit D was low at 8.47, so supplementation was recommended. She started this as recommended.  Most recent Assessment and Plan (01/18/23): This is Shelly Larson, a 52 y.o. female with: Chronic migraines without aura. She has previously failed propranolol, metoprolol, and topiramate for migraine prevention. She has failed tylenol, Excedrin, ibuprofen, sumatriptan, and rizatriptan for migraine rescue. Vit D deficiency     Plan: Migraine prevention:  Elavil 50 mg qhs Migraine rescue:  Nurtec 75 mg PRN Limit use of pain relievers to no more than 2 days out of week to prevent risk of rebound or medication-overuse headache. Physical therapy for neck pain and tightness Keep headache diary Continue vit D 1000 IU daily Follow up in 1 month  Since their last visit: Patient started Nurtec. It has been very helpful. Patient has had about 4 headaches per month, about 7-8 over the last couple of months.  On Monday (04/30/23) patient started having a headache. She initially took tylenol. She then took Nurtec that got rid of the headache for a while. The headache then came back. She thought the headache was going away, but it didn't and started coming back. She took Nurtec again, but still has a headache. This headache is similar to known headaches. This is the first headache that has been debilitating of late.  Patient endorses some increased stress due to husband's health. There has been no other recent changes to health or medications.  MEDICATIONS:  Outpatient Encounter  Medications as of 05/03/2023  Medication Sig   albuterol (VENTOLIN HFA) 108 (90 Base) MCG/ACT inhaler Inhale 2 puffs into the lungs every 4 (four) hours as needed for wheezing or shortness of breath.    amLODipine (NORVASC) 10 MG tablet Take 1 tablet (10 mg total) by mouth daily.   amphetamine-dextroamphetamine (ADDERALL XR) 30 MG 24 hr capsule Take 1 capsule (30 mg total) by mouth every morning. (Patient taking differently: Take 30 mg by mouth in the morning.)   amphetamine-dextroamphetamine (ADDERALL) 15 MG tablet Take 1 tab po around 2 pm (Patient taking differently: Take 15 mg by mouth daily as needed (in the afternoon as needed for focus/attention.).)   Calcium Carb-Cholecalciferol (CALTRATE 600+D3 PO) Take 1 tablet by mouth in the morning.   clonazePAM (KLONOPIN) 0.5 MG tablet Take 1 tablet (0.5 mg total) by mouth 2 (two) times daily as needed for anxiety.   hydrocortisone (ANUSOL-HC) 2.5 % rectal cream Place 1 Application rectally 2 (two) times daily. (Patient taking differently: Place 1 Application rectally 2 (two) times daily as needed (discomfort/irritation.).)   linaclotide (LINZESS) 290 MCG CAPS capsule Take 1 capsule (290 mcg total) by mouth daily before breakfast.   losartan-hydrochlorothiazide (HYZAAR) 100-25 MG tablet TAKE 1 TABLET BY MOUTH EVERY DAY   methylPREDNISolone (MEDROL DOSEPAK) 4 MG TBPK tablet Take 6tabs x1day, then 5tabs x1day, then 4tabs x1day, then 3tabs x1day, then  2tabs x1day, then 1tab x1day, then STOP   metoprolol succinate (TOPROL-XL) 50 MG 24 hr tablet Take 50 mg by mouth every morning.    Na Sulfate-K Sulfate-Mg Sulf 17.5-3.13-1.6 GM/177ML SOLN As directed   omeprazole (PRILOSEC) 40 MG capsule Take 1 capsule (40 mg total) by mouth daily. (Patient taking differently: Take 40 mg by mouth every evening.)   Rhubarb (ESTROVEN MENOPAUSE RELIEF PO) Take 1 tablet by mouth daily in the afternoon.   Rimegepant Sulfate (NURTEC) 75 MG TBDP Take 75 mg by mouth daily as needed.    [DISCONTINUED] amitriptyline (ELAVIL) 25 MG tablet Take 2 tablets (50 mg total) by mouth at bedtime.   [DISCONTINUED] promethazine (PHENERGAN) 12.5 MG tablet Take 1 tablet (12.5 mg total) by mouth every 6 (six) hours as needed for nausea or vomiting.   amitriptyline (ELAVIL) 25 MG tablet Take 3 tablets (75 mg total) by mouth at bedtime.   potassium chloride SA (KLOR-CON M) 20 MEQ tablet Take 2 tablets (40 mEq total) by mouth daily for 5 days.   promethazine (PHENERGAN) 12.5 MG tablet Take 1 tablet (12.5 mg total) by mouth every 6 (six) hours as needed for nausea or vomiting.   No facility-administered encounter medications on file as of 05/03/2023.    PAST MEDICAL HISTORY: Past Medical History:  Diagnosis Date   ADHD    Allergies    Anemia    Arthritis    Asthma    Biliary acute pancreatitis 2010   Colon cancer (HCC) 2016   Transverse colon    Diabetes mellitus without complication (HCC)    Dyspnea    GERD (gastroesophageal reflux disease)    Hypertension    Hypertension    Migraine     PAST SURGICAL HISTORY: Past Surgical History:  Procedure Laterality Date   ABLATION     BAND HEMORRHOIDECTOMY     BIOPSY  09/02/2021   Procedure: BIOPSY;  Surgeon: Lanelle Bal, DO;  [facility];  Service: Endoscopy;;   BREAST REDUCTION SURGERY Bilateral    CHOLECYSTECTOMY  2010   [place], biliary pancreatitis   COLECTOMY  09/30/2015   distal transverse d/t adenocarcinoma   COLONOSCOPY N/A 06/11/2018   Procedure: COLONOSCOPY;  Surgeon: Corbin Ade, MD;  [facility];  Service: Endoscopy;  Laterality: N/A;  1:45pm   COLONOSCOPY WITH PROPOFOL N/A 04/02/2023   Procedure: COLONOSCOPY WITH PROPOFOL;  Surgeon: Corbin Ade, MD;  [facility];  Service: Endoscopy;  Laterality: N/A;  12:15 pm   DILITATION & CURRETTAGE/HYSTROSCOPY WITH NOVASURE ABLATION N/A 09/10/2018   Procedure: DILATATION & CURETTAGE/HYSTEROSCOPY WITH MINERVA  ENDOMETRIAL ABLATION;   Surgeon: Tilda Burrow, MD;  Location: [facility];  Service: Gynecology;  Laterality: N/A;   ESOPHAGOGASTRODUODENOSCOPY (EGD) WITH PROPOFOL N/A 09/02/2021   Procedure: ESOPHAGOGASTRODUODENOSCOPY (EGD) WITH PROPOFOL;  Surgeon: Lanelle Bal, DO;  [facility];  Service: Endoscopy;  Laterality: N/A;   POLYPECTOMY  06/11/2018   Procedure: POLYPECTOMY;  Surgeon: Corbin Ade, MD;  [facility];  Service: Endoscopy;;  colon   POLYPECTOMY  04/02/2023   Procedure: POLYPECTOMY;  Surgeon: Corbin Ade, MD;  [facility];  Service: Endoscopy;;    ALLERGIES: Allergies  Allergen Reactions   Lisinopril Cough    FAMILY HISTORY: Family History  Problem Relation Age of Onset   Hypertension Mother    Diabetes Mellitus II Mother    Heart disease Sister        dx in late  30's   Breast cancer Paternal Grandmother    Breast cancer Paternal Aunt    Colon cancer Neg Hx    Liver disease Neg Hx     SOCIAL HISTORY: Social History   Tobacco Use   Smoking status: Never   Smokeless tobacco: Never  Vaping Use   Vaping Use: Never used  Substance Use Topics   Alcohol use: Yes    Comment: rare   Drug use: No   Social History   Social History Narrative   Are you right handed or left handed? right   Are you currently employed ? 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 And niece   What type of home do you live in: 1 story or 2 story? One     Caffeine 1 soda a day      Objective:  Vital Signs:  BP (!) 142/100   Pulse 84   Ht 5\' 10"  (1.778 m)   Wt 286 lb (129.7 kg)   SpO2 98%   BMI 41.04 kg/m   General: No acute distress.  Patient appears well-groomed.   Head:  Normocephalic/atraumatic Eyes:  Fundi examined. No obvious papilledema. Neck: supple Neurological Exam: alert and oriented to person, place, and time.  Speech fluent and not dysarthric, language intact.  CN II-XII intact.  Bulk and tone normal, muscle strength 5/5 throughout.  Sensation to light touch intact.  Deep tendon reflexes 2+ throughout.  Finger to nose testing intact.  Gait normal.   Labs and Imaging review: New results: HbA1c (01/09/22): 6.3  03/28/23: BMP significant for K of 2.9. CBC unremarkable  Previously reviewed results: Vit D (10/27/22): 8.47 B12 (10/27/22): 392  Recent Labs           Lab Results  Component Value Date    HGBA1C 6.3 08/17/2022      Recent Labs           Lab Results  Component Value Date    TSH 1.42 08/17/2022      Recent Labs[] Expand by Default           Lab Results  Component Value Date    ESRSEDRATE 25 (H) 08/31/2021      Normal or unremarkable: CBC, CMP   Iron studies: iron 81, TIBC 364, sat ratio 22.3, ferritin 39.7  Assessment/Plan:  This is Shelly Larson, a 52 y.o. female with: Chronic migraines without aura. She has previously failed propranolol, metoprolol, and topiramate for migraine prevention. She has failed tylenol, Excedrin, ibuprofen, sumatriptan, and rizatriptan for migraine rescue. Vit D deficiency    Plan: Medrol dose pack to break current headache cyle Migraine prevention:  Increase Elavil to 75 mg qhs Migraine rescue:  Nurtec 75 mg PRN Limit use of pain relievers to no more than 2 days out of week to prevent risk of rebound or medication-overuse headache. Keep headache diary Continue vit D 1000 IU daily  Return to clinic in 6 months  Jacquelyne Balint, MD

## 2023-05-02 NOTE — Telephone Encounter (Signed)
Called pt and gave her the answer from doctor Hill. She understood and will call office back to get an appointment

## 2023-05-03 ENCOUNTER — Encounter: Payer: Self-pay | Admitting: Neurology

## 2023-05-03 ENCOUNTER — Ambulatory Visit: Payer: Managed Care, Other (non HMO) | Admitting: Neurology

## 2023-05-03 VITALS — BP 142/100 | HR 84 | Ht 70.0 in | Wt 286.0 lb

## 2023-05-03 DIAGNOSIS — R29898 Other symptoms and signs involving the musculoskeletal system: Secondary | ICD-10-CM | POA: Diagnosis not present

## 2023-05-03 DIAGNOSIS — R110 Nausea: Secondary | ICD-10-CM

## 2023-05-03 DIAGNOSIS — E559 Vitamin D deficiency, unspecified: Secondary | ICD-10-CM

## 2023-05-03 DIAGNOSIS — M542 Cervicalgia: Secondary | ICD-10-CM | POA: Diagnosis not present

## 2023-05-03 DIAGNOSIS — G43E09 Chronic migraine with aura, not intractable, without status migrainosus: Secondary | ICD-10-CM

## 2023-05-03 MED ORDER — AMITRIPTYLINE HCL 25 MG PO TABS
75.0000 mg | ORAL_TABLET | Freq: Every day | ORAL | 11 refills | Status: DC
Start: 2023-05-03 — End: 2024-06-05

## 2023-05-03 MED ORDER — PROMETHAZINE HCL 12.5 MG PO TABS
12.5000 mg | ORAL_TABLET | Freq: Four times a day (QID) | ORAL | 3 refills | Status: DC | PRN
Start: 2023-05-03 — End: 2023-11-07

## 2023-05-03 MED ORDER — METHYLPREDNISOLONE 4 MG PO TBPK
ORAL_TABLET | ORAL | 0 refills | Status: DC
Start: 2023-05-03 — End: 2023-08-02

## 2023-05-03 NOTE — Patient Instructions (Addendum)
Plan: Medrol dose pack to break current headache cyle - sent to your pharmacy Migraine prevention:  Increase Elavil to 75 mg qhs - new prescription sent to your pharmacy Migraine rescue:  Nurtec 75 mg PRN Limit use of pain relievers to no more than 2 days out of week to prevent risk of rebound or medication-overuse headache. Keep headache diary Continue vit D 1000 IU daily  If your headache does not break with steroid burst, please let me know. If your headaches are increasing in frequency or intensity please let me know.  Return to clinic in 6 months or sooner if needed.  The physicians and staff at [facility] are committed to providing excellent care. You may receive a survey requesting feedback about your experience at our office. We strive to receive "very good" responses to the survey questions. If you feel that your experience would prevent you from giving the office a "very good " response, please contact our office to try to remedy the situation. We may be reached at [phone]. Thank you for taking the time out of your busy day to complete the survey.  Jacquelyne Balint, MD [facility]

## 2023-05-07 ENCOUNTER — Ambulatory Visit (HOSPITAL_BASED_OUTPATIENT_CLINIC_OR_DEPARTMENT_OTHER): Payer: Managed Care, Other (non HMO)

## 2023-05-07 DIAGNOSIS — I10 Essential (primary) hypertension: Secondary | ICD-10-CM | POA: Diagnosis not present

## 2023-05-07 DIAGNOSIS — I5030 Unspecified diastolic (congestive) heart failure: Secondary | ICD-10-CM

## 2023-05-07 DIAGNOSIS — I517 Cardiomegaly: Secondary | ICD-10-CM

## 2023-05-07 DIAGNOSIS — Z01810 Encounter for preprocedural cardiovascular examination: Secondary | ICD-10-CM

## 2023-05-07 LAB — ECHOCARDIOGRAM COMPLETE
Area-P 1/2: 3.27 cm2
P 1/2 time: 451 msec
S' Lateral: 2.9 cm

## 2023-05-08 ENCOUNTER — Encounter: Payer: Self-pay | Admitting: Physician Assistant

## 2023-05-08 ENCOUNTER — Ambulatory Visit: Payer: Managed Care, Other (non HMO) | Admitting: Physician Assistant

## 2023-05-08 VITALS — BP 142/94 | HR 68 | Temp 97.8°F | Ht 70.0 in | Wt 282.4 lb

## 2023-05-08 DIAGNOSIS — F410 Panic disorder [episodic paroxysmal anxiety] without agoraphobia: Secondary | ICD-10-CM | POA: Diagnosis not present

## 2023-05-08 DIAGNOSIS — F909 Attention-deficit hyperactivity disorder, unspecified type: Secondary | ICD-10-CM | POA: Diagnosis not present

## 2023-05-08 DIAGNOSIS — R7303 Prediabetes: Secondary | ICD-10-CM

## 2023-05-08 DIAGNOSIS — K130 Diseases of lips: Secondary | ICD-10-CM

## 2023-05-08 DIAGNOSIS — Z124 Encounter for screening for malignant neoplasm of cervix: Secondary | ICD-10-CM

## 2023-05-08 LAB — POCT GLYCOSYLATED HEMOGLOBIN (HGB A1C): Hemoglobin A1C: 6.7 % — AB (ref 4.0–5.6)

## 2023-05-08 NOTE — Assessment & Plan Note (Signed)
PDMP reviewed today, no red flags, filling appropriately.  Patient is stable with Adderall XR 30 mg in the mornings, occasional use of Adderall IR 15 mg at 2 PM. No side effects or concerns with the medicine. 

## 2023-05-08 NOTE — Progress Notes (Unsigned)
Subjective:    Patient ID: Shelly Larson, female    DOB: 1971-07-05, 52 y.o.   MRN: [id number]  Chief Complaint  Patient presents with   Follow-up    Pt in office for med check and 3 mon follow up; pt has been having a headache for past week and half, pt states not where it was but getting better; pt is doing weight loss appts as well; pt hasn't got in to see GYN and placed referral to see a GYN in [place] and become established    HPI Patient is in today for regular 3 month follow-up.   Currently following with cardiology, GI (had colonoscopy updated recently), bariatric surgery (tentatively scheduled for July 2024), neurology.  Needs referral to GYN today for pap smear.  Husband is going through dialysis, kidney transplant list, this is increasing some stress at home. Otherwise she has been doing well.   Needs refill on klonopin and Adderall prescriptions. No issues.   Past Medical History:  Diagnosis Date   ADHD    Allergies    Anemia    Arthritis    Asthma    Biliary acute pancreatitis 2010   Colon cancer (HCC) 2016   Transverse colon    Diabetes mellitus without complication (HCC)    Dyspnea    GERD (gastroesophageal reflux disease)    Hypertension    Hypertension    Migraine     Past Surgical History:  Procedure Laterality Date   ABLATION     BAND HEMORRHOIDECTOMY     BIOPSY  09/02/2021   Procedure: BIOPSY;  Surgeon: Lanelle Bal, DO;  [facility];  Service: Endoscopy;;   BREAST REDUCTION SURGERY Bilateral    CHOLECYSTECTOMY  2010   [place], biliary pancreatitis   COLECTOMY  09/30/2015   distal transverse d/t adenocarcinoma   COLONOSCOPY N/A 06/11/2018   Procedure: COLONOSCOPY;  Surgeon: Corbin Ade, MD;  [facility];  Service: Endoscopy;  Laterality: N/A;  1:45pm   COLONOSCOPY WITH PROPOFOL N/A 04/02/2023   Procedure: COLONOSCOPY WITH PROPOFOL;  Surgeon: Corbin Ade, MD;  [facility];  Service:  Endoscopy;  Laterality: N/A;  12:15 pm   DILITATION & CURRETTAGE/HYSTROSCOPY WITH NOVASURE ABLATION N/A 09/10/2018   Procedure: DILATATION & CURETTAGE/HYSTEROSCOPY WITH MINERVA  ENDOMETRIAL ABLATION;  Surgeon: Tilda Burrow, MD;  Location: [facility];  Service: Gynecology;  Laterality: N/A;   ESOPHAGOGASTRODUODENOSCOPY (EGD) WITH PROPOFOL N/A 09/02/2021   Procedure: ESOPHAGOGASTRODUODENOSCOPY (EGD) WITH PROPOFOL;  Surgeon: Lanelle Bal, DO;  [facility];  Service: Endoscopy;  Laterality: N/A;   POLYPECTOMY  06/11/2018   Procedure: POLYPECTOMY;  Surgeon: Corbin Ade, MD;  [facility];  Service: Endoscopy;;  colon   POLYPECTOMY  04/02/2023   Procedure: POLYPECTOMY;  Surgeon: Corbin Ade, MD;  [facility];  Service: Endoscopy;;    Family History  Problem Relation Age of Onset   Hypertension Mother    Diabetes Mellitus II Mother    Heart disease Sister        dx in late 73's   Breast cancer Paternal Grandmother    Breast cancer Paternal Aunt    Colon cancer Neg Hx    Liver disease Neg Hx     Social History   Tobacco Use   Smoking status: Never   Smokeless tobacco: Never  Vaping Use   Vaping Use: Never used  Substance Use Topics   Alcohol use: Yes  Comment: rare   Drug use: No     Allergies  Allergen Reactions   Lisinopril Cough    Review of Systems NEGATIVE UNLESS OTHERWISE INDICATED IN HPI      Objective:     BP (!) 142/94 (BP Location: Left Arm)   Pulse 68   Temp 97.8 F (36.6 C) (Temporal)   Ht 5\' 10"  (1.778 m)   Wt 282 lb 6.4 oz (128.1 kg)   SpO2 95%   BMI 40.52 kg/m   Wt Readings from Last 3 Encounters:  05/08/23 282 lb 6.4 oz (128.1 kg)  05/03/23 286 lb (129.7 kg)  04/10/23 282 lb 1.6 oz (128 kg)    BP Readings from Last 3 Encounters:  05/08/23 (!) 142/94  05/03/23 (!) 142/100  04/10/23 130/82     Physical Exam Vitals and nursing note reviewed.  Constitutional:      Appearance: Normal  appearance. She is obese.  Cardiovascular:     Rate and Rhythm: Normal rate and regular rhythm.     Pulses: Normal pulses.  Pulmonary:     Effort: Pulmonary effort is normal.     Breath sounds: Normal breath sounds.  Skin:    Findings: Lesion (lower lip - see photo below) present.  Neurological:     General: No focal deficit present.     Mental Status: She is alert and oriented to person, place, and time.  Psychiatric:        Mood and Affect: Mood normal.        Behavior: Behavior normal.        Assessment & Plan:  Adult ADHD Assessment & Plan: PDMP reviewed today, no red flags, filling appropriately.  Patient is stable with Adderall XR 30 mg in the mornings, occasional use of Adderall IR 15 mg at 2 PM. No side effects or concerns with the medicine.  Orders: -     Amphetamine-Dextroamphet ER; Take 1 capsule (30 mg total) by mouth every morning.  Dispense: 30 capsule; Refill: 0 -     Amphetamine-Dextroamphet ER; Take 1 capsule (30 mg total) by mouth every morning.  Dispense: 30 capsule; Refill: 0 -     Amphetamine-Dextroamphet ER; Take 1 capsule (30 mg total) by mouth every morning.  Dispense: 30 capsule; Refill: 0 -     Amphetamine-Dextroamphetamine; Take 1 tablet by mouth daily after lunch.  Dispense: 30 tablet; Refill: 0 -     Amphetamine-Dextroamphetamine; Take 1 tablet by mouth daily after lunch.  Dispense: 30 tablet; Refill: 0 -     Amphetamine-Dextroamphetamine; Take 1 tablet by mouth daily after lunch.  Dispense: 30 tablet; Refill: 0  Panic disorder (episodic paroxysmal anxiety) Assessment & Plan: Klonopin 0.5 mg to take BID prn; stable on this. PDMP reviewed today, no red flags, filling appropriately.  Refill sent.   Orders: -     clonazePAM; Take 1 tablet (0.5 mg total) by mouth 2 (two) times daily as needed for anxiety.  Dispense: 30 tablet; Refill: 1  Prediabetes Assessment & Plan: Lab Results  Component Value Date   HGBA1C 6.7 (A) 05/08/2023   HGBA1C 6.3  (A) 01/09/2023   HGBA1C 6.3 08/17/2022   Now into diabetic range.  She is planning on bariatric surgery. I anticipate this will help bring glucose numbers down; hold on medications at this time. She will keep working on lifestyle.   Orders: -     POCT glycosylated hemoglobin (Hb A1C)  Lip lesion -     Ambulatory referral to Dermatology  Pap smear for cervical cancer screening -     Ambulatory referral to Gynecology        Return in about 3 months (around 08/08/2023) for recheck/follow-up, ADHD management .    Carter Kaman M Rmoni Keplinger, PA-C

## 2023-05-08 NOTE — Assessment & Plan Note (Signed)
Klonopin 0.5 mg to take BID prn; stable on this. PDMP reviewed today, no red flags, filling appropriately.  Refill sent.

## 2023-05-09 MED ORDER — CLONAZEPAM 0.5 MG PO TABS
0.5000 mg | ORAL_TABLET | Freq: Two times a day (BID) | ORAL | 1 refills | Status: DC | PRN
Start: 2023-05-09 — End: 2023-07-09

## 2023-05-09 MED ORDER — AMPHETAMINE-DEXTROAMPHETAMINE 15 MG PO TABS
15.0000 mg | ORAL_TABLET | Freq: Every day | ORAL | 0 refills | Status: DC
Start: 2023-07-08 — End: 2023-07-04

## 2023-05-09 MED ORDER — AMPHETAMINE-DEXTROAMPHET ER 30 MG PO CP24
30.0000 mg | ORAL_CAPSULE | ORAL | 0 refills | Status: AC
Start: 2023-07-08 — End: ?

## 2023-05-09 MED ORDER — AMPHETAMINE-DEXTROAMPHETAMINE 15 MG PO TABS
15.0000 mg | ORAL_TABLET | Freq: Every day | ORAL | 0 refills | Status: DC
Start: 2023-05-09 — End: 2023-07-04

## 2023-05-09 MED ORDER — AMPHETAMINE-DEXTROAMPHETAMINE 15 MG PO TABS
15.0000 mg | ORAL_TABLET | Freq: Every day | ORAL | 0 refills | Status: DC
Start: 2023-06-08 — End: 2023-07-04

## 2023-05-09 MED ORDER — AMPHETAMINE-DEXTROAMPHET ER 30 MG PO CP24
30.0000 mg | ORAL_CAPSULE | ORAL | 0 refills | Status: DC
Start: 2023-06-08 — End: 2023-07-04

## 2023-05-09 MED ORDER — AMPHETAMINE-DEXTROAMPHET ER 30 MG PO CP24
30.0000 mg | ORAL_CAPSULE | ORAL | 0 refills | Status: DC
Start: 2023-05-09 — End: 2023-07-04

## 2023-05-09 NOTE — Assessment & Plan Note (Signed)
Lab Results  Component Value Date   HGBA1C 6.7 (A) 05/08/2023   HGBA1C 6.3 (A) 01/09/2023   HGBA1C 6.3 08/17/2022   Now into diabetic range.  She is planning on bariatric surgery. I anticipate this will help bring glucose numbers down; hold on medications at this time. She will keep working on lifestyle.

## 2023-05-10 NOTE — Telephone Encounter (Signed)
Spoke with [place] at [facility] and she is requesting a copy of Dr Di Kindle note giving the patient clearance. The patient was seen on 04/10/23.   The fax number to send over a copy of the note is [place] at [phone].

## 2023-05-10 NOTE — Telephone Encounter (Signed)
Caller is following-up on the status of patient's clearance. 

## 2023-05-11 ENCOUNTER — Encounter (HOSPITAL_BASED_OUTPATIENT_CLINIC_OR_DEPARTMENT_OTHER): Payer: Self-pay | Admitting: Cardiology

## 2023-05-11 NOTE — Telephone Encounter (Signed)
Sent office visit to [place] as requested

## 2023-06-03 ENCOUNTER — Other Ambulatory Visit: Payer: Self-pay | Admitting: Physician Assistant

## 2023-06-04 ENCOUNTER — Encounter: Payer: Managed Care, Other (non HMO) | Attending: General Surgery | Admitting: Skilled Nursing Facility1

## 2023-06-04 ENCOUNTER — Encounter: Payer: Self-pay | Admitting: Skilled Nursing Facility1

## 2023-06-04 NOTE — Progress Notes (Signed)
Pre-Operative Nutrition Class:    Patient was seen on 06/04/2023 for Pre-Operative Bariatric Surgery Education at [facility].    Surgery date:  Surgery type:  Start weight at [facility]: 282 Weight today: pt arrived after the start of class   The following the learning objectives were met by the patient during this course: Identify Pre-Op Dietary Goals and will begin 2 weeks pre-operatively Identify appropriate sources of fluids and proteins  State protein recommendations and appropriate sources pre and post-operatively Identify Post-Operative Dietary Goals and will follow for 2 weeks post-operatively Identify appropriate multivitamin and calcium sources Describe the need for physical activity post-operatively and will follow MD recommendations State when to call healthcare provider regarding medication questions or post-operative complications When having a diagnosis of diabetes understanding hypoglycemia symptoms and the inclusion of 1 complex carbohydrate per meal  Handouts given during class include: Pre-Op Bariatric Surgery Diet Handout Protein Shake Handout Post-Op Bariatric Surgery Nutrition Handout BELT Program Information Flyer Support Group Information Flyer [facility] Bariatric Supplements Price List  Follow-Up Plan: Patient will follow-up at [facility] 2 weeks post operatively for diet advancement per MD.

## 2023-07-04 ENCOUNTER — Encounter (HOSPITAL_BASED_OUTPATIENT_CLINIC_OR_DEPARTMENT_OTHER): Payer: Self-pay | Admitting: Emergency Medicine

## 2023-07-04 ENCOUNTER — Encounter (HOSPITAL_COMMUNITY)
Admission: EM | Disposition: A | Discharge status: Home / Self Care | Payer: Self-pay | Source: Home / Self Care | Attending: Student

## 2023-07-04 ENCOUNTER — Ambulatory Visit (HOSPITAL_COMMUNITY)
Admission: EM | Admit: 2023-07-04 | Discharge: 2023-07-04 | Disposition: A | Discharge status: Home / Self Care | Payer: Managed Care, Other (non HMO) | Attending: Emergency Medicine | Admitting: Emergency Medicine

## 2023-07-04 ENCOUNTER — Emergency Department (HOSPITAL_BASED_OUTPATIENT_CLINIC_OR_DEPARTMENT_OTHER)
Admission: EM | Admit: 2023-07-04 | Discharge: 2023-07-04 | Disposition: A | Discharge status: Home / Self Care | Payer: Managed Care, Other (non HMO) | Source: Home / Self Care | Attending: Emergency Medicine | Admitting: Emergency Medicine

## 2023-07-04 ENCOUNTER — Emergency Department (HOSPITAL_COMMUNITY): Payer: Managed Care, Other (non HMO) | Admitting: Certified Registered"

## 2023-07-04 ENCOUNTER — Other Ambulatory Visit: Payer: Self-pay

## 2023-07-04 ENCOUNTER — Emergency Department (EMERGENCY_DEPARTMENT_HOSPITAL): Payer: Managed Care, Other (non HMO) | Admitting: Certified Registered"

## 2023-07-04 ENCOUNTER — Encounter (HOSPITAL_COMMUNITY): Payer: Self-pay | Admitting: *Deleted

## 2023-07-04 DIAGNOSIS — K219 Gastro-esophageal reflux disease without esophagitis: Secondary | ICD-10-CM | POA: Diagnosis not present

## 2023-07-04 DIAGNOSIS — Z79899 Other long term (current) drug therapy: Secondary | ICD-10-CM | POA: Insufficient documentation

## 2023-07-04 DIAGNOSIS — I10 Essential (primary) hypertension: Secondary | ICD-10-CM

## 2023-07-04 DIAGNOSIS — K6289 Other specified diseases of anus and rectum: Secondary | ICD-10-CM | POA: Diagnosis not present

## 2023-07-04 DIAGNOSIS — F909 Attention-deficit hyperactivity disorder, unspecified type: Secondary | ICD-10-CM | POA: Diagnosis not present

## 2023-07-04 DIAGNOSIS — K643 Fourth degree hemorrhoids: Secondary | ICD-10-CM | POA: Insufficient documentation

## 2023-07-04 DIAGNOSIS — F419 Anxiety disorder, unspecified: Secondary | ICD-10-CM | POA: Diagnosis not present

## 2023-07-04 DIAGNOSIS — K649 Unspecified hemorrhoids: Secondary | ICD-10-CM | POA: Insufficient documentation

## 2023-07-04 DIAGNOSIS — E119 Type 2 diabetes mellitus without complications: Secondary | ICD-10-CM | POA: Diagnosis not present

## 2023-07-04 DIAGNOSIS — K648 Other hemorrhoids: Secondary | ICD-10-CM | POA: Diagnosis not present

## 2023-07-04 DIAGNOSIS — Z85038 Personal history of other malignant neoplasm of large intestine: Secondary | ICD-10-CM | POA: Insufficient documentation

## 2023-07-04 DIAGNOSIS — Z6841 Body Mass Index (BMI) 40.0 and over, adult: Secondary | ICD-10-CM

## 2023-07-04 DIAGNOSIS — J45909 Unspecified asthma, uncomplicated: Secondary | ICD-10-CM | POA: Insufficient documentation

## 2023-07-04 HISTORY — PX: HEMORRHOID SURGERY: SHX153

## 2023-07-04 LAB — COMPREHENSIVE METABOLIC PANEL
ALT: 79 U/L — ABNORMAL HIGH (ref 0–44)
AST: 175 U/L — ABNORMAL HIGH (ref 15–41)
Albumin: 3.6 g/dL (ref 3.5–5.0)
Alkaline Phosphatase: 65 U/L (ref 38–126)
Anion gap: 8 (ref 5–15)
BUN: 11 mg/dL (ref 6–20)
CO2: 26 mmol/L (ref 22–32)
Calcium: 8.5 mg/dL — ABNORMAL LOW (ref 8.9–10.3)
Chloride: 101 mmol/L (ref 98–111)
Creatinine, Ser: 0.75 mg/dL (ref 0.44–1.00)
GFR, Estimated: >60 mL/min (ref >60)
Glucose, Bld: 143 mg/dL — ABNORMAL HIGH (ref 70–99)
Potassium: 3.8 mmol/L (ref 3.5–5.1)
Sodium: 135 mmol/L (ref 135–145)
Total Bilirubin: 0.8 mg/dL (ref 0.3–1.2)
Total Protein: 6.8 g/dL (ref 6.5–8.1)

## 2023-07-04 LAB — CBC
HCT: 43.3 % (ref 36.0–46.0)
Hemoglobin: 14 g/dL (ref 12.0–15.0)
MCH: 28.4 pg (ref 26.0–34.0)
MCHC: 32.3 g/dL (ref 30.0–36.0)
MCV: 87.8 fL (ref 80.0–100.0)
Platelets: 146 10*3/uL — ABNORMAL LOW (ref 150–400)
RBC: 4.93 MIL/uL (ref 3.87–5.11)
RDW: 14.2 % (ref 11.5–15.5)
WBC: 4.9 10*3/uL (ref 4.0–10.5)
nRBC: 0 % (ref 0.0–0.2)

## 2023-07-04 LAB — POC URINE PREG, ED: Preg Test, Ur: NEGATIVE

## 2023-07-04 SURGERY — HEMORRHOIDECTOMY
Anesthesia: General | Site: Rectum

## 2023-07-04 MED ORDER — PROPOFOL 10 MG/ML IV BOLUS
INTRAVENOUS | Status: DC | PRN
Start: 2023-07-04 — End: 2023-07-04
  Administered 2023-07-04: 200 mg via INTRAVENOUS

## 2023-07-04 MED ORDER — LACTATED RINGERS IV SOLN: INTRAVENOUS | Status: DC

## 2023-07-04 MED ORDER — ROCURONIUM BROMIDE 10 MG/ML (PF) SYRINGE
PREFILLED_SYRINGE | INTRAVENOUS | Status: AC
  Filled 2023-07-04: qty 10

## 2023-07-04 MED ORDER — CHLORHEXIDINE GLUCONATE 0.12 % MT SOLN
15.0000 mL | Freq: Once | OROMUCOSAL | Status: AC
  Administered 2023-07-04: 15 mL via OROMUCOSAL

## 2023-07-04 MED ORDER — HYDROMORPHONE HCL 1 MG/ML IJ SOLN: 0.2500 mg | INTRAMUSCULAR | Status: DC | PRN

## 2023-07-04 MED ORDER — PHENYLEPHRINE 80 MCG/ML (10ML) SYRINGE FOR IV PUSH (FOR BLOOD PRESSURE SUPPORT)
PREFILLED_SYRINGE | INTRAVENOUS | Status: AC
  Filled 2023-07-04: qty 10

## 2023-07-04 MED ORDER — BUPIVACAINE HCL (PF) 0.5 % IJ SOLN
INTRAMUSCULAR | Status: DC | PRN
  Administered 2023-07-04: 30 mL

## 2023-07-04 MED ORDER — ONDANSETRON HCL 4 MG/2ML IJ SOLN
INTRAMUSCULAR | Status: DC | PRN
  Administered 2023-07-04: 4 mg via INTRAVENOUS

## 2023-07-04 MED ORDER — CEFAZOLIN IN SODIUM CHLORIDE 3-0.9 GM/100ML-% IV SOLN
INTRAVENOUS | Status: AC
  Filled 2023-07-04: qty 100

## 2023-07-04 MED ORDER — HYDROMORPHONE HCL 1 MG/ML IJ SOLN
1.0000 mg | Freq: Once | INTRAMUSCULAR | Status: AC
  Administered 2023-07-04: 1 mg via INTRAMUSCULAR

## 2023-07-04 MED ORDER — HYDROMORPHONE HCL 1 MG/ML IJ SOLN
INTRAMUSCULAR | Status: AC
  Filled 2023-07-04: qty 2

## 2023-07-04 MED ORDER — LACTATED RINGERS IV BOLUS
1000.0000 mL | Freq: Once | INTRAVENOUS | Status: AC
  Administered 2023-07-04: 1000 mL via INTRAVENOUS

## 2023-07-04 MED ORDER — ONDANSETRON 4 MG PO TBDP: 4.0000 mg | ORAL_TABLET | Freq: Once | ORAL | Status: DC

## 2023-07-04 MED ORDER — ORAL CARE MOUTH RINSE: 15.0000 mL | Freq: Once | OROMUCOSAL | Status: AC

## 2023-07-04 MED ORDER — LIDOCAINE HCL 2 % EX GEL: 1.0000 | CUTANEOUS | 2 refills | Status: DC | PRN

## 2023-07-04 MED ORDER — ONDANSETRON HCL 4 MG/2ML IJ SOLN: 4.0000 mg | Freq: Once | INTRAMUSCULAR | Status: DC

## 2023-07-04 MED ORDER — FENTANYL CITRATE (PF) 100 MCG/2ML IJ SOLN
INTRAMUSCULAR | Status: AC
  Filled 2023-07-04: qty 2

## 2023-07-04 MED ORDER — MORPHINE SULFATE (PF) 2 MG/ML IV SOLN
2.0000 mg | INTRAVENOUS | Status: DC | PRN
  Administered 2023-07-04: 2 mg via INTRAVENOUS
  Filled 2023-07-04: qty 1

## 2023-07-04 MED ORDER — DEXAMETHASONE SODIUM PHOSPHATE 4 MG/ML IJ SOLN
INTRAMUSCULAR | Status: DC | PRN
  Administered 2023-07-04: 5 mg via INTRAVENOUS

## 2023-07-04 MED ORDER — SODIUM CHLORIDE 0.9 % IR SOLN
Status: DC | PRN
  Administered 2023-07-04: 1000 mL

## 2023-07-04 MED ORDER — SUCCINYLCHOLINE CHLORIDE 200 MG/10ML IV SOSY
PREFILLED_SYRINGE | INTRAVENOUS | Status: AC
  Filled 2023-07-04: qty 10

## 2023-07-04 MED ORDER — KETOROLAC TROMETHAMINE 15 MG/ML IJ SOLN
15.0000 mg | Freq: Once | INTRAMUSCULAR | Status: DC
  Filled 2023-07-04: qty 1

## 2023-07-04 MED ORDER — KETAMINE HCL 10 MG/ML IJ SOLN
INTRAMUSCULAR | Status: DC | PRN
  Administered 2023-07-04: 25 mg via INTRAVENOUS

## 2023-07-04 MED ORDER — BUPIVACAINE HCL (PF) 0.5 % IJ SOLN
INTRAMUSCULAR | Status: AC
  Filled 2023-07-04: qty 30

## 2023-07-04 MED ORDER — KETAMINE HCL 50 MG/5ML IJ SOSY
PREFILLED_SYRINGE | INTRAMUSCULAR | Status: AC
  Filled 2023-07-04: qty 5

## 2023-07-04 MED ORDER — OXYCODONE HCL 5 MG PO TABS: 5.0000 mg | ORAL_TABLET | ORAL | 0 refills | Status: DC | PRN

## 2023-07-04 MED ORDER — ROCURONIUM BROMIDE 10 MG/ML (PF) SYRINGE
PREFILLED_SYRINGE | INTRAVENOUS | Status: DC | PRN
  Administered 2023-07-04: 50 mg via INTRAVENOUS

## 2023-07-04 MED ORDER — ONDANSETRON HCL 4 MG/2ML IJ SOLN: 4.0000 mg | Freq: Four times a day (QID) | INTRAMUSCULAR | Status: DC | PRN

## 2023-07-04 MED ORDER — METOCLOPRAMIDE HCL 5 MG/ML IJ SOLN
10.0000 mg | Freq: Once | INTRAMUSCULAR | Status: AC
  Administered 2023-07-04: 10 mg via INTRAVENOUS

## 2023-07-04 MED ORDER — OXYCODONE-ACETAMINOPHEN 5-325 MG PO TABS
2.0000 | ORAL_TABLET | Freq: Once | ORAL | Status: AC
  Administered 2023-07-04: 2 via ORAL
  Filled 2023-07-04: qty 2

## 2023-07-04 MED ORDER — SUGAMMADEX SODIUM 200 MG/2ML IV SOLN
INTRAVENOUS | Status: DC | PRN
  Administered 2023-07-04: 300 mg via INTRAVENOUS

## 2023-07-04 MED ORDER — LIDOCAINE HCL URETHRAL/MUCOSAL 2 % EX GEL
1.0000 | Freq: Once | CUTANEOUS | Status: AC
  Administered 2023-07-04: 1 via TOPICAL
  Filled 2023-07-04: qty 11

## 2023-07-04 MED ORDER — LIDOCAINE HCL (PF) 2 % IJ SOLN
INTRAMUSCULAR | Status: AC
  Filled 2023-07-04: qty 5

## 2023-07-04 MED ORDER — CEFAZOLIN IN SODIUM CHLORIDE 3-0.9 GM/100ML-% IV SOLN
3.0000 g | INTRAVENOUS | Status: DC
  Filled 2023-07-04 (×2): qty 100

## 2023-07-04 MED ORDER — SEVOFLURANE IN SOLN
RESPIRATORY_TRACT | Status: AC
  Filled 2023-07-04: qty 250

## 2023-07-04 MED ORDER — PROPOFOL 10 MG/ML IV BOLUS
INTRAVENOUS | Status: AC
  Filled 2023-07-04: qty 20

## 2023-07-04 MED ORDER — DEXAMETHASONE SODIUM PHOSPHATE 10 MG/ML IJ SOLN
INTRAMUSCULAR | Status: AC
  Filled 2023-07-04: qty 1

## 2023-07-04 MED ORDER — LIDOCAINE VISCOUS HCL 2 % MT SOLN
OROMUCOSAL | Status: AC
  Filled 2023-07-04: qty 15

## 2023-07-04 MED ORDER — METOCLOPRAMIDE HCL 5 MG/ML IJ SOLN
INTRAMUSCULAR | Status: AC
  Filled 2023-07-04: qty 2

## 2023-07-04 MED ORDER — SUCCINYLCHOLINE CHLORIDE 200 MG/10ML IV SOSY
PREFILLED_SYRINGE | INTRAVENOUS | Status: DC | PRN
  Administered 2023-07-04: 120 mg via INTRAVENOUS

## 2023-07-04 MED ORDER — DEXMEDETOMIDINE HCL IN NACL 80 MCG/20ML IV SOLN
INTRAVENOUS | Status: AC
  Filled 2023-07-04: qty 20

## 2023-07-04 MED ORDER — HYDROMORPHONE HCL 1 MG/ML IJ SOLN
1.0000 mg | Freq: Once | INTRAMUSCULAR | Status: AC
  Administered 2023-07-04: 1 mg via INTRAMUSCULAR
  Filled 2023-07-04: qty 1

## 2023-07-04 MED ORDER — ONDANSETRON HCL 4 MG PO TABS: 4.0000 mg | ORAL_TABLET | Freq: Three times a day (TID) | ORAL | 1 refills | Status: DC | PRN

## 2023-07-04 MED ORDER — HYDROCORTISONE (PERIANAL) 2.5 % EX CREA: 1.0000 | TOPICAL_CREAM | Freq: Two times a day (BID) | CUTANEOUS | 1 refills | Status: AC

## 2023-07-04 MED ORDER — NALOXONE HCL 0.4 MG/ML IJ SOLN
INTRAMUSCULAR | Status: DC | PRN
  Administered 2023-07-04: 80 ug via INTRAVENOUS

## 2023-07-04 MED ORDER — MIDAZOLAM HCL 2 MG/2ML IJ SOLN
INTRAMUSCULAR | Status: DC | PRN
  Administered 2023-07-04: 2 mg via INTRAVENOUS

## 2023-07-04 MED ORDER — DEXTROSE 5 % IV SOLN
INTRAVENOUS | Status: DC | PRN
  Administered 2023-07-04: 3 g via INTRAVENOUS

## 2023-07-04 MED ORDER — MIDAZOLAM HCL 2 MG/2ML IJ SOLN
INTRAMUSCULAR | Status: AC
  Filled 2023-07-04: qty 2

## 2023-07-04 MED ORDER — KETOROLAC TROMETHAMINE 15 MG/ML IJ SOLN
15.0000 mg | Freq: Once | INTRAMUSCULAR | Status: AC
  Administered 2023-07-04: 15 mg via INTRAVENOUS

## 2023-07-04 MED ORDER — ONDANSETRON HCL 4 MG/2ML IJ SOLN
4.0000 mg | Freq: Once | INTRAMUSCULAR | Status: AC
  Administered 2023-07-04: 4 mg via INTRAVENOUS
  Filled 2023-07-04: qty 2

## 2023-07-04 MED ORDER — LIDOCAINE VISCOUS HCL 2 % MT SOLN
OROMUCOSAL | Status: DC | PRN
  Administered 2023-07-04: 1

## 2023-07-04 MED ORDER — MEPERIDINE HCL 50 MG/ML IJ SOLN: 6.2500 mg | INTRAMUSCULAR | Status: DC | PRN

## 2023-07-04 MED ORDER — ONDANSETRON HCL 4 MG/2ML IJ SOLN
INTRAMUSCULAR | Status: AC
  Filled 2023-07-04: qty 2

## 2023-07-04 MED ORDER — PHENYLEPHRINE 80 MCG/ML (10ML) SYRINGE FOR IV PUSH (FOR BLOOD PRESSURE SUPPORT)
PREFILLED_SYRINGE | INTRAVENOUS | Status: DC | PRN
  Administered 2023-07-04: 160 ug via INTRAVENOUS

## 2023-07-04 MED ORDER — LIDOCAINE 2% (20 MG/ML) 5 ML SYRINGE
INTRAMUSCULAR | Status: DC | PRN
  Administered 2023-07-04: 100 mg via INTRAVENOUS

## 2023-07-04 MED ORDER — FENTANYL CITRATE (PF) 250 MCG/5ML IJ SOLN
INTRAMUSCULAR | Status: DC | PRN
  Administered 2023-07-04 (×2): 50 ug via INTRAVENOUS

## 2023-07-04 MED ORDER — HYDROMORPHONE HCL 1 MG/ML IJ SOLN: 2.0000 mg | Freq: Once | INTRAMUSCULAR | Status: DC

## 2023-07-04 MED ORDER — CHLORHEXIDINE GLUCONATE CLOTH 2 % EX PADS: 6.0000 | MEDICATED_PAD | Freq: Once | CUTANEOUS | Status: DC

## 2023-07-04 MED ORDER — KETOROLAC TROMETHAMINE 15 MG/ML IJ SOLN: 15.0000 mg | Freq: Once | INTRAMUSCULAR | Status: DC

## 2023-07-04 SURGICAL SUPPLY — 31 items
BAG HAMPER (MISCELLANEOUS) ×1 IMPLANT
CLOTH BEACON ORANGE TIMEOUT ST (SAFETY) ×1 IMPLANT
COVER LIGHT HANDLE STERIS (MISCELLANEOUS) ×2 IMPLANT
DISSECTOR SURG LIGASURE 21 (MISCELLANEOUS) ×1 IMPLANT
DRAPE HALF SHEET 40X57 (DRAPES) ×2 IMPLANT
ELECT REM PT RETURN 9FT ADLT (ELECTROSURGICAL) ×1
ELECTRODE REM PT RTRN 9FT ADLT (ELECTROSURGICAL) ×1 IMPLANT
GAUZE 4X4 16PLY ~~LOC~~+RFID DBL (SPONGE) ×1 IMPLANT
GAUZE SPONGE 4X4 12PLY STRL (GAUZE/BANDAGES/DRESSINGS) ×1 IMPLANT
GLOVE BIO SURGEON STRL SZ 6.5 (GLOVE) ×1 IMPLANT
GLOVE BIOGEL PI IND STRL 6.5 (GLOVE) ×1 IMPLANT
GLOVE BIOGEL PI IND STRL 7.0 (GLOVE) ×2 IMPLANT
GOWN STRL REUS W/TWL LRG LVL3 (GOWN DISPOSABLE) ×2 IMPLANT
HEMOSTAT SURGICEL 4X8 (HEMOSTASIS) ×1 IMPLANT
KIT TURNOVER CYSTO (KITS) ×1 IMPLANT
MANIFOLD NEPTUNE II (INSTRUMENTS) ×1 IMPLANT
NDL HYPO 18GX1.5 BLUNT FILL (NEEDLE) ×1 IMPLANT
NDL HYPO 21X1.5 SAFETY (NEEDLE) ×1 IMPLANT
NEEDLE HYPO 18GX1.5 BLUNT FILL (NEEDLE) ×1 IMPLANT
NEEDLE HYPO 21X1.5 SAFETY (NEEDLE) ×1 IMPLANT
NS IRRIG 1000ML POUR BTL (IV SOLUTION) ×1 IMPLANT
PACK PERI GYN (CUSTOM PROCEDURE TRAY) ×1 IMPLANT
PAD ARMBOARD 7.5X6 YLW CONV (MISCELLANEOUS) ×1 IMPLANT
POSITIONER HEAD 8X9X4 ADT (SOFTGOODS) ×1 IMPLANT
SET BASIN LINEN APH (SET/KITS/TRAYS/PACK) ×1 IMPLANT
SPONGE SURGIFOAM ABS GEL 100 (HEMOSTASIS) ×1 IMPLANT
SURGILUBE 2OZ TUBE FLIPTOP (MISCELLANEOUS) ×1 IMPLANT
SUT SILK 0 FSL (SUTURE) ×1 IMPLANT
SUT VIC AB 2-0 CT2 27 (SUTURE) IMPLANT
SYR 30ML LL (SYRINGE) ×2 IMPLANT
SYR BULB IRRIG 60ML STRL (SYRINGE) IMPLANT

## 2023-07-04 NOTE — ED Provider Notes (Signed)
[facility] AT [facility] Provider Note   CSN: [id number] Arrival date & time: 07/04/23  0737     History  Chief Complaint  Patient presents with   Hemorrhoids    Shelly Larson is a 52 y.o. female.  Patient is a 52 year old female with past medical history of hypertension, hemorrhoids, migraines, GERD.  Patient presenting today with complaints of rectal pain and bleeding.  She reports straining to have a bowel movement earlier this evening, then noted blood and pain in her rectum when she wiped.  Patient has history of hemorrhoids in the past and this feels similar.  No abdominal pain, nausea, or vomiting.  The history is provided by the patient.       Home Medications Prior to Admission medications   Medication Sig Start Date End Date Taking? Authorizing Provider  albuterol (VENTOLIN HFA) 108 (90 Base) MCG/ACT inhaler Inhale 2 puffs into the lungs every 4 (four) hours as needed for wheezing or shortness of breath.  10/17/17   [provider]  amitriptyline (ELAVIL) 25 MG tablet Take 3 tablets (75 mg total) by mouth at bedtime. 05/03/23 05/02/24  Antony Madura, MD  amLODipine (NORVASC) 10 MG tablet TAKE 1 TABLET BY MOUTH EVERY DAY 06/04/23   Allwardt, Alyssa M, PA-C  amphetamine-dextroamphetamine (ADDERALL XR) 30 MG 24 hr capsule Take 1 capsule (30 mg total) by mouth every morning. Patient taking differently: Take 30 mg by mouth in the morning. 10/09/22 03/22/24  Allwardt, Crist Infante, PA-C  amphetamine-dextroamphetamine (ADDERALL XR) 30 MG 24 hr capsule Take 1 capsule (30 mg total) by mouth every morning. 05/09/23 06/08/23  Allwardt, Crist Infante, PA-C  amphetamine-dextroamphetamine (ADDERALL XR) 30 MG 24 hr capsule Take 1 capsule (30 mg total) by mouth every morning. 06/08/23 07/08/23  Allwardt, Crist Infante, PA-C  amphetamine-dextroamphetamine (ADDERALL XR) 30 MG 24 hr capsule Take 1 capsule (30 mg total) by mouth every morning. 07/08/23 08/07/23  Allwardt,  Alyssa M, PA-C  amphetamine-dextroamphetamine (ADDERALL) 15 MG tablet Take 1 tab po around 2 pm Patient taking differently: Take 15 mg by mouth daily as needed (in the afternoon as needed for focus/attention.). 10/09/22   Allwardt, Alyssa M, PA-C  amphetamine-dextroamphetamine (ADDERALL) 15 MG tablet Take 1 tablet by mouth daily after lunch. 05/09/23 06/08/23  Allwardt, Alyssa M, PA-C  amphetamine-dextroamphetamine (ADDERALL) 15 MG tablet Take 1 tablet by mouth daily after lunch. 06/08/23 07/08/23  Allwardt, Alyssa M, PA-C  amphetamine-dextroamphetamine (ADDERALL) 15 MG tablet Take 1 tablet by mouth daily after lunch. 07/08/23 08/07/23  Allwardt, Crist Infante, PA-C  Calcium Carb-Cholecalciferol (CALTRATE 600+D3 PO) Take 1 tablet by mouth in the morning.    [provider]  clonazePAM (KLONOPIN) 0.5 MG tablet Take 1 tablet (0.5 mg total) by mouth 2 (two) times daily as needed for anxiety. 05/09/23   Allwardt, Crist Infante, PA-C  hydrocortisone (ANUSOL-HC) 2.5 % rectal cream Place 1 Application rectally 2 (two) times daily. Patient taking differently: Place 1 Application rectally 2 (two) times daily as needed (discomfort/irritation.). 09/04/22   Aida Raider, NP  linaclotide Karlene Einstein) 290 MCG CAPS capsule Take 1 capsule (290 mcg total) by mouth daily before breakfast. 10/12/22   Aida Raider, NP  losartan-hydrochlorothiazide (HYZAAR) 100-25 MG tablet TAKE 1 TABLET BY MOUTH EVERY DAY 11/23/22   Allwardt, Crist Infante, PA-C  methylPREDNISolone (MEDROL DOSEPAK) 4 MG TBPK tablet Take 6tabs x1day, then 5tabs x1day, then 4tabs x1day, then 3tabs x1day, then 2tabs x1day, then 1tab x1day, then STOP 05/03/23  Antony Madura, MD  metoprolol succinate (TOPROL-XL) 50 MG 24 hr tablet Take 50 mg by mouth every morning.  09/19/17   [provider]  Na Sulfate-K Sulfate-Mg Sulf 17.5-3.13-1.6 GM/177ML SOLN As directed 03/28/23   Rourk, Gerrit Friends, MD  omeprazole (PRILOSEC) 40 MG capsule Take 1 capsule (40 mg total) by  mouth daily. Patient taking differently: Take 40 mg by mouth every evening. 03/19/23   Aida Raider, NP  potassium chloride SA (KLOR-CON M) 20 MEQ tablet Take 2 tablets (40 mEq total) by mouth daily for 5 days. 03/29/23 04/10/23  Rourk, Gerrit Friends, MD  promethazine (PHENERGAN) 12.5 MG tablet Take 1 tablet (12.5 mg total) by mouth every 6 (six) hours as needed for nausea or vomiting. 05/03/23   Antony Madura, MD  Rhubarb (ESTROVEN MENOPAUSE RELIEF PO) Take 1 tablet by mouth daily in the afternoon.    [provider]  Rimegepant Sulfate (NURTEC) 75 MG TBDP Take 75 mg by mouth daily as needed. 10/27/22   Antony Madura, MD      Allergies    Lisinopril    Review of Systems   Review of Systems  All other systems reviewed and are negative.   Physical Exam Updated Vital Signs BP (!) 163/90 (BP Location: Left Arm)   Pulse 99   Temp 98.5 F (36.9 C) (Oral)   Resp 17   Ht 5\' 10"  (1.778 m)   Wt 127.9 kg   SpO2 97%   BMI 40.46 kg/m  Physical Exam Vitals and nursing note reviewed.  Constitutional:      Appearance: Normal appearance.  Pulmonary:     Effort: Pulmonary effort is normal.  Genitourinary:    Comments: Rectal examination reveals multiple, large, exquisitely tender, external hemorrhoids Skin:    General: Skin is warm and dry.  Neurological:     Mental Status: She is alert.     ED Results / Procedures / Treatments   Labs (all labs ordered are listed, but only abnormal results are displayed) Labs Reviewed - No data to display  EKG None  Radiology No results found.  Procedures Procedures    Medications Ordered in ED Medications - No data to display  ED Course/ Medical Decision Making/ A&P  Patient presenting with multiple, large, painful hemorrhoids.  This was discussed with Dr. Janee Morn from general surgery who has recommended hydrocortisone, Preparation H, ice packs, and lidocaine jelly.  Patient to follow-up in the surgery clinic later this  week.  Final Clinical Impression(s) / ED Diagnoses Final diagnoses:  None    Rx / DC Orders ED Discharge Orders     None         Geoffery Lyons, MD 07/04/23 [phone]

## 2023-07-04 NOTE — Anesthesia Preprocedure Evaluation (Signed)
Anesthesia Evaluation  Patient identified by MRN, date of birth, ID band Patient awake    Reviewed: Allergy & Precautions, H&P , NPO status , Patient's Chart, lab work & pertinent test results, reviewed documented beta blocker date and time   Airway Mallampati: III  TM Distance: >3 FB Neck ROM: Full    Dental no notable dental hx. (+) Teeth Intact, Dental Advisory Given   Pulmonary shortness of breath, asthma    Pulmonary exam normal breath sounds clear to auscultation       Cardiovascular Exercise Tolerance: Good hypertension, Pt. on medications and Pt. on home beta blockers Normal cardiovascular exam Rhythm:Regular Rate:Normal     Neuro/Psych  Headaches PSYCHIATRIC DISORDERS Anxiety        GI/Hepatic ,GERD  Medicated and Controlled,,(+) Hepatitis -  Endo/Other  diabetes, Well Controlled, Type 2, Oral Hypoglycemic Agents    Renal/GU Renal disease  negative genitourinary   Musculoskeletal  (+) Arthritis , Osteoarthritis,    Abdominal   Peds negative pediatric ROS (+) ADHD Hematology  (+) Blood dyscrasia, anemia   Anesthesia Other Findings   Reproductive/Obstetrics negative OB ROS                             Anesthesia Physical Anesthesia Plan  ASA: 3 and emergent  Anesthesia Plan: General   Post-op Pain Management: Dilaudid IV   Induction: Intravenous and Rapid sequence  PONV Risk Score and Plan: 4 or greater and Ondansetron, Dexamethasone, Midazolam and Metaclopromide  Airway Management Planned: Oral ETT and Video Laryngoscope Planned  Additional Equipment:   Intra-op Plan:   Post-operative Plan: Extubation in OR  Informed Consent: I have reviewed the patients History and Physical, chart, labs and discussed the procedure including the risks, benefits and alternatives for the proposed anesthesia with the patient or authorized representative who has indicated his/her  understanding and acceptance.     Dental advisory given  Plan Discussed with: CRNA and Surgeon  Anesthesia Plan Comments:        Anesthesia Quick Evaluation

## 2023-07-04 NOTE — Discharge Instructions (Signed)
Discharge Instructions: Please take your roxicodone as prescribed and alternate with tylenol every 4-6 hours.   Do not take any aspirin or NSAIDs, ibuprofen, aleve, BC powder for 5 days.  After 07/10/23 you can start taking these medications again.  Please keep the area clean and dry and take Sitz baths (swallow warm water baths) for comfort and after bowel movements. Take at least 4 Sitz baths a day in the first week.  If you cannot get in a bath tub, you can purchase a Sitz bath that goes on the toilet at the pharmacy.  Take Linzess but you may need to take more to keep yourself regular.  Please keep your stools soft and take fiber daily (metamucil) and colace (over the counter) to help prevent constipation.  If you have not had a BM in 2 days, please take Miralax, and if you have not had a BM after this, please notify Dr. Henreitta Leber.  Expect some bleeding following the hemorrhoid surgery and significant pain.  Go to the ED with extensive bleeding (soaking 2 large pads in < 1 hour), fevers, or chills.    Shower per your regular routine daily.   Rest and listen to your body, but do not remain in bed all day.  Walk everyday for at least 15-20 minutes. Deep cough and move around every 1-2 hours in the first few days after surgery.  Do not lift > 10 lbs, perform excessive bending, pushing, pulling, squatting. You will have pressure and pain at your anus and this is common. You may want to obtain a donut pillow from your pharmacy to sit on.   Some nausea is common and poor appetite. The main goal is to stay hydrated the first few days after surgery.   Pain Expectations and Narcotics: -After surgery you will have pain associated with your surgery and this is normal. The pain is muscular and nerve pain, and will get better with time. -You are encouraged and expected to take non narcotic medications like tylenol and ibuprofen (when able) to treat pain as multiple modalities can aid with pain  treatment. -Narcotics are only used when pain is severe or there is breakthrough pain. -You are not expected to have a pain score of 0 after surgery, as we cannot prevent pain. A pain score of 3-4 that allows you to be functional, move, walk, and tolerate some activity is the goal. The pain will continue to improve over the days after surgery and is dependent on your surgery. -Due to [place] law, we are only able to give a certain amount of pain medication to treat post operative pain, and we only give additional narcotics on a patient by patient basis.  -For most laparoscopic surgery, studies have shown that the majority of patients only need 10-15 narcotic pills, and for open surgeries or anal surgeries most patients only need 15-20.   -Having appropriate expectations of pain and knowledge of pain management with non narcotics is important as we do not want anyone to become addicted to narcotic pain medication.  -Doing your Sitz baths will help with pain and pressure discomfort.  -Simple acts like meditation and mindfulness practices after surgery can also help with pain control and research has proven the benefit of these practices.  Contact Information: If you have questions or concerns, please call our office, [phone], Monday- Thursday 8AM-5PM and Friday 8AM-12Noon.  If it is after hours or on the weekend, please call [facility] Main [facility], [phone] and ask to speak to  the surgeon on call for Dr. Henreitta Leber at [facility].

## 2023-07-04 NOTE — Anesthesia Procedure Notes (Signed)
Procedure Name: Intubation Date/Time: 07/04/2023 4:07 PM  Performed by: Julian Reil, CRNAPre-anesthesia Checklist: Patient identified, Emergency Drugs available, Suction available and Patient being monitored Patient Re-evaluated:Patient Re-evaluated prior to induction Oxygen Delivery Method: Circle system utilized Preoxygenation: Pre-oxygenation with 100% oxygen Induction Type: IV induction, Rapid sequence and Cricoid Pressure applied Laryngoscope Size: Glidescope and 3 Grade View: Grade I Tube type: Oral Tube size: 7.5 mm Number of attempts: 1 Airway Equipment and Method: Stylet and Video-laryngoscopy Placement Confirmation: ETT inserted through vocal cords under direct vision, positive ETCO2 and breath sounds checked- equal and bilateral Secured at: 23 cm Tube secured with: Tape Dental Injury: Teeth and Oropharynx as per pre-operative assessment  Comments: Glidescope electively used due to large tongue, short thick neck and planned RSI.

## 2023-07-04 NOTE — ED Provider Notes (Signed)
  Physical Exam  BP (!) 161/92   Pulse 87   Temp 97.7 F (36.5 C) (Oral)   Resp 18   SpO2 94%   Physical Exam Vitals and nursing note reviewed.  Constitutional:      General: She is not in acute distress.    Appearance: She is well-developed.  HENT:     Head: Normocephalic and atraumatic.  Eyes:     Conjunctiva/sclera: Conjunctivae normal.  Cardiovascular:     Rate and Rhythm: Normal rate and regular rhythm.     Heart sounds: No murmur heard. Pulmonary:     Effort: Pulmonary effort is normal. No respiratory distress.     Breath sounds: Normal breath sounds.  Abdominal:     Palpations: Abdomen is soft.     Tenderness: There is no abdominal tenderness.  Musculoskeletal:        General: No swelling.     Cervical back: Neck supple.  Skin:    General: Skin is warm and dry.     Capillary Refill: Capillary refill takes less than 2 seconds.  Neurological:     Mental Status: She is alert.  Psychiatric:        Mood and Affect: Mood normal.     Procedures  Procedures  ED Course / MDM    Medical Decision Making Risk Prescription drug management. Decision regarding hospitalization.   Patient received in handoff.  Prolapsed hemorrhoids pending surgical evaluation at 8 AM.  Patient evaluated by Dr. Henreitta Leber who will go to the operating room around 4 PM today.       Glendora Score, MD 07/04/23 1055

## 2023-07-04 NOTE — Progress Notes (Signed)
[facility]  Updated her family. If she wants to stay overnight  for pain control she can versus going home.  She has a tampon in her bottom that will come out before discharge. If it does not come out let her know she will poop it out.   Algis Greenhouse, MD [facility] [address]ella R[address] [place], [place] [address] [phone] (office)

## 2023-07-04 NOTE — Discharge Instructions (Addendum)
Apply Anusol cream 2 times daily.  Apply lidocaine jelly as needed for pain.  Apply Preparation H every 6 hours.  This medication is available over-the-counter.  Apply ice packs as frequently as possible.  Take Metamucil 1 heaping teaspoon in a glass of water 3 times daily.  You should also begin taking Colace (equate stool softener) 100 mg twice daily.  You should be in the habit of using the Metamucil and Colace regularly for prevention of hard stools and straining with bowel movements.  Follow-up with general surgery later this week.  The contact information for [facility] [place] [facility] has been provided in this discharge summary for you to call and make these arrangements.

## 2023-07-04 NOTE — Anesthesia Postprocedure Evaluation (Signed)
Anesthesia Post Note  Patient: Shelly Larson  Procedure(s) Performed: EXTENSIVE HEMORRHOIDECTOMY (Rectum)  Patient location during evaluation: PACU Anesthesia Type: General Level of consciousness: awake and alert and oriented Pain management: pain level controlled Vital Signs Assessment: post-procedure vital signs reviewed and stable Respiratory status: spontaneous breathing, nonlabored ventilation and respiratory function stable Cardiovascular status: blood pressure returned to baseline and stable Postop Assessment: no apparent nausea or vomiting Anesthetic complications: no  No notable events documented.   Last Vitals:  Vitals:   07/04/23 1715 07/04/23 1725  BP: (!) 173/101 (!) 143/84  Pulse: 76 79  Resp: (!) 37 12  Temp:    SpO2: 100% 95%    Last Pain:  Vitals:   07/04/23 1715  TempSrc:   PainSc: 0-No pain                 Caprina Wussow C Nida Manfredi

## 2023-07-04 NOTE — ED Triage Notes (Signed)
Pt c/o rectal pain, reports she was trying to have a bowel movement when she felt her hemorrhoids coming out.

## 2023-07-04 NOTE — Transfer of Care (Signed)
Immediate Anesthesia Transfer of Care Note  Patient: Shelly Larson  Procedure(s) Performed: EXTENSIVE HEMORRHOIDECTOMY (Rectum)  Patient Location: PACU  Anesthesia Type:General  Level of Consciousness: drowsy  Airway & Oxygen Therapy: Patient Spontanous Breathing and Patient connected to face mask oxygen  Post-op Assessment: Report given to RN and Post -op Vital signs reviewed and stable  Post vital signs: Reviewed and stable  Last Vitals:  Vitals Value Taken Time  BP 162/101 07/04/23 1702  Temp    Pulse 76 07/04/23 1705  Resp 36 07/04/23 1705  SpO2 96 % 07/04/23 1705  Vitals shown include unvalidated device data.  Last Pain:  Vitals:   07/04/23 1428  TempSrc: Oral  PainSc: 5       Patients Stated Pain Goal: 4 (07/04/23 1428)  Complications: No notable events documented.

## 2023-07-04 NOTE — ED Triage Notes (Addendum)
Pt reports hemorrhoids intermittently x years, reports normally internal but today feel external, no relief with tucks pads

## 2023-07-04 NOTE — ED Provider Notes (Signed)
[facility]-EMERGENCY DEPT [facility] Emergency Department Provider Note MRN:  [id number]  Arrival date & time: 07/04/23     Chief Complaint   Hemorrhoids   History of Present Illness   Shelly Larson is a 52 y.o. year-old female with a history of diabetes presenting to the ED with chief complaint of hemorrhoids.  Patient was straining to have a bowel movement this evening at about 9 PM when she felt something fall out of her anus.  It became very uncomfortable, would not go back in.  She is concerned that her hemorrhoids fell out.  She has known hemorrhoids according to her GI doctor.  Review of Systems  A thorough review of systems was obtained and all systems are negative except as noted in the HPI and PMH.   Patient's Health History    Past Medical History:  Diagnosis Date   ADHD    Allergies    Anemia    Arthritis    Asthma    Biliary acute pancreatitis 2010   Colon cancer (HCC) 2016   Transverse colon    Diabetes mellitus without complication (HCC)    Dyspnea    GERD (gastroesophageal reflux disease)    Hypertension    Hypertension    Migraine     Past Surgical History:  Procedure Laterality Date   ABLATION     BAND HEMORRHOIDECTOMY     BIOPSY  09/02/2021   Procedure: BIOPSY;  Surgeon: Lanelle Bal, DO;  Location: [facility] ENDO SUITE;  Service: Endoscopy;;   BREAST REDUCTION SURGERY Bilateral    CHOLECYSTECTOMY  2010   [place], biliary pancreatitis   COLECTOMY  09/30/2015   distal transverse d/t adenocarcinoma   COLONOSCOPY N/A 06/11/2018   Procedure: COLONOSCOPY;  Surgeon: Corbin Ade, MD;  Location: [facility] ENDO SUITE;  Service: Endoscopy;  Laterality: N/A;  1:45pm   COLONOSCOPY WITH PROPOFOL N/A 04/02/2023   Procedure: COLONOSCOPY WITH PROPOFOL;  Surgeon: Corbin Ade, MD;  Location: [facility] END[facility];  Service: Endoscopy;  Laterality: N/A;  12:15 pm   DILITATION & CURRETTAGE/HYSTROSCOPY WITH NOVASURE ABLATION N/A 09/10/2018   Procedure: DILATATION &  CURETTAGE/HYSTEROSCOPY WITH MINERVA  ENDOMETRIAL ABLATION;  Surgeon: Tilda Burrow, MD;  Location: [facility] ORS;  Service: Gynecology;  Laterality: N/A;   ESOPHAGOGASTRODUODENOSCOPY (EGD) WITH PROPOFOL N/A 09/02/2021   Procedure: ESOPHAGOGASTRODUODENOSCOPY (EGD) WITH PROPOFOL;  Surgeon: Lanelle Bal, DO;  Location: [facility] SU[facility];  Service: Endoscopy;  Laterality: N/A;   POLYPECTOMY  06/11/2018   Procedure: POLYPECTOMY;  Surgeon: Corbin Ade, MD;  Location: [facility] ENDO SUITE;  Service: Endoscopy;;  colon   POLYPECTOMY  04/02/2023   Procedure: POLYPECTOMY;  Surgeon: Corbin Ade, MD;  Location: [facility];  Service: Endoscopy;;    Family History  Problem Relation Age of Onset   Hypertension Mother    Diabetes Mellitus II Mother    Heart disease Sister        dx in late 42's   Breast cancer Paternal Grandmother    Breast cancer Paternal Aunt    Colon cancer Neg Hx    Liver disease Neg Hx     Social History   Socioeconomic History   Marital status: Single    Spouse name: Not on file   Number of children: Not on file   Years of education: Not on file   Highest education level: Not on file  Occupational History   Occupation: warranty analyst  Tobacco Use   Smoking status: Never   Smokeless  tobacco: Never  Vaping Use   Vaping Use: Never used  Substance and Sexual Activity   Alcohol use: Yes    Comment: rare   Drug use: No   Sexual activity: Yes    Birth control/protection: None, Surgical    Comment: ablation  Other Topics Concern   Not on file  Social History Narrative   Are you right handed or left handed? right   Are you currently employed ? 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 And niece   What type of home do you live in: 1 story or 2 story? One     Caffeine 1 soda a day   Social Determinants of Health   Financial Resource Strain: Not on file  Food Insecurity: Not on file  Transportation  Needs: Not on file  Physical Activity: Not on file  Stress: Not on file  Social Connections: Not on file  Intimate Partner Violence: Not on file     Physical Exam   Vitals:   07/04/23 0540  BP: (!) 147/87  Pulse: 91  Resp: 19  Temp: 99.9 F (37.7 C)  SpO2: 97%    CONSTITUTIONAL: Well-appearing, NAD NEURO/PSYCH:  Alert and oriented x 3, no focal deficits EYES:  eyes equal and reactive ENT/NECK:  no LAD, no JVD CARDIO: Regular rate, well-perfused, normal S1 and S2 PULM:  CTAB no wheezing or rhonchi GI/GU:  non-distended, non-tender MSK/SPINE:  No gross deformities, no edema SKIN:  no rash, atraumatic    Media Information   Document Information  Photos    07/04/2023 06:17  Attached To:  Hospital Encounter on 07/04/23  Source Information  Damonie Furney, Elmer Sow, MD  Ap-Emergency Dept   *Additional and/or pertinent findings included in MDM below  Diagnostic and Interventional Summary    EKG Interpretation Date/Time:    Ventricular Rate:    PR Interval:    QRS Duration:    QT Interval:    QTC Calculation:   R Axis:      Text Interpretation:         Labs Reviewed - No data to display  No orders to display    Medications  HYDROmorphone (DILAUDID) injection 1 mg (has no administration in time range)  HYDROmorphone (DILAUDID) injection 1 mg (1 mg Intramuscular Given 07/04/23 0601)  HYDROmorphone (DILAUDID) injection 1 mg (1 mg Intramuscular Given 07/04/23 1610)     Procedures  /  Critical Care Procedures  ED Course and Medical Decision Making  Initial Impression and Ddx Per chart review patient has known grade 3 internal hemorrhoids.  I suspect they have worsened and now she has prolapsed internal hemorrhoids.  She clearly also has large external hemorrhoids but there is a clear difference between the external and internal hemorrhoids.  Another consideration was prolapsed rectum but not consistent with this on exam.  Patient having a lot of discomfort.  Past  medical/surgical history that increases complexity of ED encounter: Hemorrhoids  Interpretation of Diagnostics Laboratory and/or imaging options to aid in the diagnosis/care of the patient were considered.  After careful history and physical examination, it was determined that there was no indication for diagnostics at this time.  Patient Reassessment and Ultimate Disposition/Management     Case discussed with Dr. Henreitta Leber of general surgery.  Reduction with sugar and slow pressure was unsuccessful x 2 attempts.  Dr. Henreitta Leber will come see the patient at 8 AM here  in the emergency department.  Signed out to oncoming provider at shift change.  Patient management required discussion with the following services or consulting groups:  General/Trauma Surgery  Complexity of Problems Addressed Acute illness or injury that poses threat of life of bodily function  Additional Data Reviewed and Analyzed Further history obtained from: Further history from spouse/family member  Additional Factors Impacting ED Encounter Risk Minor Procedures  Elmer Sow. Pilar Plate, MD [facility] Emergency Medicine [facility] [email or web address]  Final Clinical Impressions(s) / ED Diagnoses     ICD-10-CM   1. Prolapsed internal hemorrhoids  K64.8       ED Discharge Orders     None        Discharge Instructions Discussed with and Provided to Patient:   Discharge Instructions   None      Sabas Sous, MD 07/04/23 [phone]

## 2023-07-04 NOTE — Op Note (Signed)
[facility] Operative Note  07/04/23  Preoperative Diagnosis: Grade IV hemorrhoids, severe anal pain   Postoperative Diagnosis: Same   Procedure(s) Performed: Extensive hemorrhoidectomy, right anterior internal and external component, right posterior internal and external component, left lateral internal component    Surgeon: Leatrice Jewels. Henreitta Leber, MD   Assistants: No qualified resident was available    Anesthesia: General endotracheal   Anesthesiologist: Molli Barrows, MD    Specimens:  Right anterior, right posterior, left lateral   Estimated Blood Loss: Minimal   Blood Replacement: None    Complications: None   Wound Class: Contaminated   Operative Indications: Ms. Staron is sa 52 yo with extensively prolapsed hemorrhoids in severe pain that presented to the ED. She was unable to get them reduced and was in extreme pain. She has known she had hemorrhoids for a long time. We discussed excision of what I am able to take safe and risk of bleeding, infection, injury to sphincter, anal stenosis, incontinence, or not being able to get all of the hemorrhoidal tissue.   Findings: Extensive hemorrhoidal tissue, largest RA with large internal component and additional tag on the hemorrhoid, LL smaller with more internal component, PR with large internal component blending into the left lateral, smaller external tissue, left adequate skin Malini Flemings between each excised site    Procedure: The patient was taken to the operating room and placed supine. General endotracheal anesthesia was induced. Intravenous antibiotics were  administered per protocol. She was then placed in lithotomy with all pressure points padded. An anorectal exam demonstrated no masses. There was a large RA hemorrhoid and large RP hemorrhoid.  The LL was not as large. I started with the RA and make an incision with cautery on the skin and used a small Ligasure, elevating the prolapsed tissue away from the  sphincter and excised a large hemorrhoidal column with external and internal component.  I then went to the next largest which was the RP and left adequate skin Willia Genrich between the RA  and RP skin incision. It was excised in the similar fashion, protecting the sphincter complex by elevating the column away while using the Ligasure.  The left lateral had more of an internal component and I did not take much skin at this area and took the internal component of the hemorrhoid column to ensure that I did not cause any anal stenosis. There was also a skin tag at 12 o' clock that I could not excise due to the worry for stenosis with too much anal verge skin removal.  I make sure the internal columns were hemostatic with cautery. I injected marcaine into the area. A Gelfoam with viscous lidocaine was tagged with a 2-0 Suture and placed in the anus. ABD and mesh panties were placed.    Final inspection revealed acceptable hemostasis. All counts were correct at the end of the case. The patient was awakened from anesthesia and extubated without complication.  The patient went to the PACU in stable condition.   Algis Greenhouse, MD [facility] [address] [place], [place] [address] [phone] (office)

## 2023-07-04 NOTE — H&P (Signed)
[facility] History and Physical  Reason for Referral: Prolapsed painful hemorrhoids  Referring Physician: Dr. Pilar Plate, ED   Chief Complaint   Hemorrhoids     Shelly Larson is a 52 y.o. female.  HPI: Shelly Larson is a 52 yo with ADHD, HTN, prior colon cancer and recent colonoscopy without recurrence. She has known she had hemorrhoids for a while and had banding but has tried to avoid surgery. She is seen by Dr. Jena Gauss and they discussed in the past that surgery will be her only option. She has had small flares and a thrombosed hemorrhoid in the past but nothing as severe as what started yesterday. She has been on Linzess and has regular Bms and says that she felt like she needed to have a BM and then once she had one a significant amount of tissue prolapsed out. She went to an ED and was given medication and told to go home with 1 week follow up appt with surgery. She says she was in extreme pain and could get no relief at home and has tried the suppositories and ointments. She came back to the ED once she had no relief. She reports no major bleeding issues.   Past Medical History:  Diagnosis Date   ADHD    Allergies    Anemia    Arthritis    Asthma    Biliary acute pancreatitis 2010   Colon cancer (HCC) 2016   Transverse colon    Diabetes mellitus without complication (HCC)    Dyspnea    GERD (gastroesophageal reflux disease)    Hypertension    Hypertension    Migraine     Past Surgical History:  Procedure Laterality Date   ABLATION     BAND HEMORRHOIDECTOMY     BIOPSY  09/02/2021   Procedure: BIOPSY;  Surgeon: Lanelle Bal, DO;  Location: [facility];  Service: Endoscopy;;   BREAST REDUCTION SURGERY Bilateral    CHOLECYSTECTOMY  2010   [place], biliary pancreatitis   COLECTOMY  09/30/2015   distal transverse d/t adenocarcinoma   COLONOSCOPY N/A 06/11/2018   Procedure: COLONOSCOPY;  Surgeon: Corbin Ade, MD;  Location: [facility];  Service:  Endoscopy;  Laterality: N/A;  1:45pm   COLONOSCOPY WITH PROPOFOL N/A 04/02/2023   Procedure: COLONOSCOPY WITH PROPOFOL;  Surgeon: Corbin Ade, MD;  Location: [facility];  Service: Endoscopy;  Laterality: N/A;  12:15 pm   DILITATION & CURRETTAGE/HYSTROSCOPY WITH NOVASURE ABLATION N/A 09/10/2018   Procedure: DILATATION & CURETTAGE/HYSTEROSCOPY WITH MINERVA  ENDOMETRIAL ABLATION;  Surgeon: Tilda Burrow, MD;  Location: [facility] ORS;  Service: Gynecology;  Laterality: N/A;   ESOPHAGOGASTRODUODENOSCOPY (EGD) WITH PROPOFOL N/A 09/02/2021   Procedure: ESOPHAGOGASTRODUODENOSCOPY (EGD) WITH PROPOFOL;  Surgeon: Lanelle Bal, DO;  Location: [facility];  Service: Endoscopy;  Laterality: N/A;   POLYPECTOMY  06/11/2018   Procedure: POLYPECTOMY;  Surgeon: Corbin Ade, MD;  Location: [facility];  Service: Endoscopy;;  colon   POLYPECTOMY  04/02/2023   Procedure: POLYPECTOMY;  Surgeon: Corbin Ade, MD;  Location: [facility];  Service: Endoscopy;;    Family History  Problem Relation Age of Onset   Hypertension Mother    Diabetes Mellitus II Mother    Heart disease Sister        dx in late 16's   Breast cancer Paternal Grandmother    Breast cancer Paternal Aunt    Colon cancer Neg Hx    Liver disease Neg  Hx     Social History   Tobacco Use   Smoking status: Never   Smokeless tobacco: Never  Vaping Use   Vaping Use: Never used  Substance Use Topics   Alcohol use: Yes    Comment: rare   Drug use: No    Medications: I have reviewed the patient's current medications. Current Facility-Administered Medications  Medication Dose Route Frequency Provider Last Rate Last Admin   ceFAZolin (ANCEF) IVPB 3g/100 mL premix  3 g Intravenous On Call to OR Lucretia Roers, MD       Chlorhexidine Gluconate Cloth 2 % PADS 6 each  6 each Topical Once Lucretia Roers, MD       morphine (PF) 2 MG/ML injection 2 mg  2 mg Intravenous Q3H PRN Lucretia Roers, MD       ondansetron  [facility]) injection 4 mg  4 mg Intravenous Q6H PRN Lucretia Roers, MD       Current Outpatient Medications  Medication Sig Dispense Refill Last Dose   albuterol (VENTOLIN HFA) 108 (90 Base) MCG/ACT inhaler Inhale 2 puffs into the lungs every 4 (four) hours as needed for wheezing or shortness of breath.       amitriptyline (ELAVIL) 25 MG tablet Take 3 tablets (75 mg total) by mouth at bedtime. 90 tablet 11    amLODipine (NORVASC) 10 MG tablet TAKE 1 TABLET BY MOUTH EVERY DAY 30 tablet 2    amphetamine-dextroamphetamine (ADDERALL XR) 30 MG 24 hr capsule Take 1 capsule (30 mg total) by mouth every morning. (Patient taking differently: Take 30 mg by mouth in the morning.) 30 capsule 0    amphetamine-dextroamphetamine (ADDERALL XR) 30 MG 24 hr capsule Take 1 capsule (30 mg total) by mouth every morning. 30 capsule 0    amphetamine-dextroamphetamine (ADDERALL XR) 30 MG 24 hr capsule Take 1 capsule (30 mg total) by mouth every morning. 30 capsule 0    [START ON 07/08/2023] amphetamine-dextroamphetamine (ADDERALL XR) 30 MG 24 hr capsule Take 1 capsule (30 mg total) by mouth every morning. 30 capsule 0    amphetamine-dextroamphetamine (ADDERALL) 15 MG tablet Take 1 tab po around 2 pm (Patient taking differently: Take 15 mg by mouth daily as needed (in the afternoon as needed for focus/attention.).) 30 tablet 0    amphetamine-dextroamphetamine (ADDERALL) 15 MG tablet Take 1 tablet by mouth daily after lunch. 30 tablet 0    amphetamine-dextroamphetamine (ADDERALL) 15 MG tablet Take 1 tablet by mouth daily after lunch. 30 tablet 0    [START ON 07/08/2023] amphetamine-dextroamphetamine (ADDERALL) 15 MG tablet Take 1 tablet by mouth daily after lunch. 30 tablet 0    Calcium Carb-Cholecalciferol (CALTRATE 600+D3 PO) Take 1 tablet by mouth in the morning.      clonazePAM (KLONOPIN) 0.5 MG tablet Take 1 tablet (0.5 mg total) by mouth 2 (two) times daily as needed for anxiety. 30 tablet 1    hydrocortisone  (ANUSOL-HC) 2.5 % rectal cream Place 1 Application rectally 2 (two) times daily. 30 g 1    lidocaine (XYLOCAINE) 2 % jelly Apply 1 Application topically as needed. 30 mL 2    linaclotide (LINZESS) 290 MCG CAPS capsule Take 1 capsule (290 mcg total) by mouth daily before breakfast. 30 capsule 5    losartan-hydrochlorothiazide (HYZAAR) 100-25 MG tablet TAKE 1 TABLET BY MOUTH EVERY DAY 90 tablet 1    methylPREDNISolone (MEDROL DOSEPAK) 4 MG TBPK tablet Take 6tabs x1day, then 5tabs x1day, then 4tabs x1day, then 3tabs x1day, then  2tabs x1day, then 1tab x1day, then STOP 21 tablet 0    metoprolol succinate (TOPROL-XL) 50 MG 24 hr tablet Take 50 mg by mouth every morning.   0    Na Sulfate-K Sulfate-Mg Sulf 17.5-3.13-1.6 GM/177ML SOLN As directed 354 mL 0    omeprazole (PRILOSEC) 40 MG capsule Take 1 capsule (40 mg total) by mouth daily. (Patient taking differently: Take 40 mg by mouth every evening.) 30 capsule 5    potassium chloride SA (KLOR-CON M) 20 MEQ tablet Take 2 tablets (40 mEq total) by mouth daily for 5 days. 10 tablet 0    promethazine (PHENERGAN) 12.5 MG tablet Take 1 tablet (12.5 mg total) by mouth every 6 (six) hours as needed for nausea or vomiting. 30 tablet 3    Rhubarb (ESTROVEN MENOPAUSE RELIEF PO) Take 1 tablet by mouth daily in the afternoon.      Rimegepant Sulfate (NURTEC) 75 MG TBDP Take 75 mg by mouth daily as needed. 16 tablet 3       Allergies  Allergen Reactions   Lisinopril Cough     ROS:  A comprehensive review of systems was negative except for: Gastrointestinal: positive for severe anal pain   Blood pressure (!) 146/86, pulse 81, temperature 97.7 F (36.5 C), temperature source Oral, resp. rate 18, SpO2 93 %. Physical Exam Vitals reviewed. Exam conducted with a chaperone present.  HENT:     Head: Normocephalic.     Nose: Nose normal.  Eyes:     Extraocular Movements: Extraocular movements intact.  Cardiovascular:     Rate and Rhythm: Normal rate.   Pulmonary:     Effort: Pulmonary effort is normal.     Breath sounds: Normal breath sounds.  Abdominal:     General: There is no distension.     Palpations: Abdomen is soft.  Genitourinary:    Rectum: External hemorrhoid and internal hemorrhoid present.     Comments: Prolapsed hemorrhoids with edema and unable to reduce back in, severely tender, no concentric circles, grouping of three large areas of prolapse and a skin tag on one Musculoskeletal:        General: Normal range of motion.  Skin:    General: Skin is warm.  Neurological:     General: No focal deficit present.     Mental Status: She is alert and oriented to person, place, and time.  Psychiatric:        Mood and Affect: Mood normal.        Behavior: Behavior normal.     Results: None    Assessment & Plan:  MARELLA VANDERPOL is a 52 y.o. female with prolapsed hemorrhoids with severe edema but no obvious thrombosis yet. She is in extreme pain and barely handles me touching the area. I cannot do a rectal exam. She has a history of cancer and known hemorrhoids. Her last colonoscopy this year was good. Have discussed options of going home and getting her on the schedule 7/22 versus hemorrhoidectomy later today given her pain and repeat visits to [facility].   Hemorrhoid surgery for external hemorrhoids is very painful. The pain and discomfort that the patient is having currently will be magnified after the surgery for at least 2-3 weeks.  The patient will have feelings of constant pressure and pain in the area from the swelling and removal of the anoderm (skin around the anus). The internal hemorrhoids are not painful to remove because the same nerves are not involved, and the sensation is different,  but removal of any external hemorrhoids will cause significant discomfort. They will need at least 4-6 weeks to recover from the surgery, and should not expect to be able to feel back to "normal for 6-8 weeks."    The risk of hemorrhoid  surgery include bleeding, risk of infection although rare, and the risk of narrowing the anal canal if too much tissue is removed. Given this risk, it is likely that only the 2 largest hemorrhoid columns would be removed during the initial surgery.  We have also discussed the risk of incontinence after surgery if the muscles were injured, and although this is rare that it can happen and is another reason to limit the amount of hemorrhoids removed.     All questions were answered to the satisfaction of the patient and family.  She would like to go home and can send FMLA to my office as needed.   Lucretia Roers 07/04/2023, 9:36 AM

## 2023-07-06 ENCOUNTER — Encounter (HOSPITAL_COMMUNITY): Payer: Self-pay | Admitting: General Surgery

## 2023-07-06 LAB — SURGICAL PATHOLOGY

## 2023-07-09 ENCOUNTER — Other Ambulatory Visit: Payer: Self-pay | Admitting: Physician Assistant

## 2023-07-09 ENCOUNTER — Telehealth: Payer: Self-pay | Admitting: Physician Assistant

## 2023-07-09 ENCOUNTER — Telehealth: Payer: Self-pay | Admitting: Family Medicine

## 2023-07-09 DIAGNOSIS — F410 Panic disorder [episodic paroxysmal anxiety] without agoraphobia: Secondary | ICD-10-CM

## 2023-07-09 NOTE — Telephone Encounter (Signed)
Final Outcome: Go to ED Now. Per note, pt agreed to comply with ED recommendation.   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 ((Office transferred for triage.) Translation No Nurse Assessment Nurse: Stefano Gaul, RN, Dwana Curd Date/Time (Eastern Time): 07/09/2023 9:05:26 AM Confirm and document reason for call. If symptomatic, describe symptoms. ---Caller states she had internal hemorrhoid surgery on July 10. has rectal bleeding and pain. has used tylenol and ice pack. has had BMs. pain level 8. States she has called her surgeon and left a message Does the patient have any new or worsening symptoms? ---Yes Will a triage be completed? ---Yes Related visit to physician within the last 2 weeks? ---No Does the PT have any chronic conditions? (i.e. diabetes, asthma, this includes High risk factors for pregnancy, etc.) ---Yes List chronic conditions. ---HTN Is the patient pregnant or possibly pregnant? (Ask all females between the ages of 66-55) ---No Is this a behavioral health or substance abuse call? ---No Guidelines Guideline Title Affirmed Question Affirmed Notes Nurse Date/Time (Eastern Time) Rectal Bleeding [1] MODERATE rectal bleeding (small blood clots, passing blood without stool, Stefano Gaul, RN,  Dwana Curd 07/09/2023 9:12:00 AM Guidelines Guideline Title Affirmed Question Affirmed Notes Nurse Date/Time Lamount Cohen Time) or toilet water turns red) AND [2] more than once a day Disp. Time Lamount Cohen Time) Disposition Final User 07/09/2023 9:15:34 AM Go to ED Now Yes Stefano Gaul, RN, Dwana Curd Final Disposition 07/09/2023 9:15:34 AM Go to ED Now Yes Stefano Gaul, RN, Clerance Lav Disagree/Comply Comply Caller Understands Yes PreDisposition Did not know what to do Care Advice Given Per Guideline GO TO ED NOW: * You need to be seen in the Emergency Department. * Another adult should drive. NOTE TO TRIAGER - DRIVING: CARE ADVICE given per Rectal Bleeding (Adult) guideline.

## 2023-07-09 NOTE — Telephone Encounter (Signed)
FYI: This call has been transferred to triage nurse: Access Nurse. Once the result note has been entered staff can address the message at that time.  Patient called in with the following symptoms:  Red Word: Possibly more blood loss following surgery on 7/10--continued pain   Please advise at Mobile [phone] (mobile)  Message is routed to Provider Pool.

## 2023-07-09 NOTE — Telephone Encounter (Signed)
Patient called and states that she is having a lot of bleeding still from her rectum (hemorrhoidectomy on 07/04/23) and her pain is on the higher level with only one pain pill left. She is bleeding through 2-3 pads a day and sometimes it goes through to her underwear. She has been rotating tylenol and ibuprofen with the pain med with minimal relief.  Recommended that she go to the ER to be checked because of the bleeding. Patient verbalized understanding.

## 2023-07-10 ENCOUNTER — Emergency Department (HOSPITAL_COMMUNITY)
Admission: EM | Admit: 2023-07-10 | Discharge: 2023-07-10 | Disposition: A | Discharge status: Home / Self Care | Payer: Managed Care, Other (non HMO) | Attending: Emergency Medicine | Admitting: Emergency Medicine

## 2023-07-10 ENCOUNTER — Encounter (HOSPITAL_COMMUNITY): Payer: Self-pay | Admitting: Emergency Medicine

## 2023-07-10 ENCOUNTER — Other Ambulatory Visit: Payer: Self-pay

## 2023-07-10 DIAGNOSIS — G8918 Other acute postprocedural pain: Secondary | ICD-10-CM | POA: Insufficient documentation

## 2023-07-10 DIAGNOSIS — Z79899 Other long term (current) drug therapy: Secondary | ICD-10-CM | POA: Insufficient documentation

## 2023-07-10 DIAGNOSIS — K644 Residual hemorrhoidal skin tags: Secondary | ICD-10-CM | POA: Diagnosis not present

## 2023-07-10 DIAGNOSIS — I10 Essential (primary) hypertension: Secondary | ICD-10-CM | POA: Diagnosis not present

## 2023-07-10 DIAGNOSIS — E119 Type 2 diabetes mellitus without complications: Secondary | ICD-10-CM | POA: Insufficient documentation

## 2023-07-10 LAB — CBC WITH DIFFERENTIAL/PLATELET
Abs Immature Granulocytes: 0.04 10*3/uL (ref 0.00–0.07)
Basophils Absolute: 0 10*3/uL (ref 0.0–0.1)
Basophils Relative: 1 %
Eosinophils Absolute: 0.3 10*3/uL (ref 0.0–0.5)
Eosinophils Relative: 5 %
HCT: 44.6 % (ref 36.0–46.0)
Hemoglobin: 14.4 g/dL (ref 12.0–15.0)
Lymphocytes Relative: 26 %
Lymphs Abs: 1.5 10*3/uL (ref 0.7–4.0)
MCH: 28.1 pg (ref 26.0–34.0)
MCHC: 32.3 g/dL (ref 30.0–36.0)
MCV: 87.1 fL (ref 80.0–100.0)
Monocytes Absolute: 0.4 10*3/uL (ref 0.1–1.0)
Monocytes Relative: 7 %
Neutro Abs: 3.3 10*3/uL (ref 1.7–7.7)
Neutrophils Relative %: 60 %
Platelets: 158 10*3/uL (ref 150–400)
RBC: 5.12 MIL/uL — ABNORMAL HIGH (ref 3.87–5.11)
RDW: 14.4 % (ref 11.5–15.5)
WBC Morphology: REACTIVE
WBC: 5.5 10*3/uL (ref 4.0–10.5)
nRBC: 0 % (ref 0.0–0.2)

## 2023-07-10 LAB — BASIC METABOLIC PANEL
Anion gap: 7 (ref 5–15)
BUN: 11 mg/dL (ref 6–20)
CO2: 23 mmol/L (ref 22–32)
Calcium: 8.8 mg/dL — ABNORMAL LOW (ref 8.9–10.3)
Chloride: 103 mmol/L (ref 98–111)
Creatinine, Ser: 0.84 mg/dL (ref 0.44–1.00)
GFR, Estimated: >60 mL/min (ref >60)
Glucose, Bld: 116 mg/dL — ABNORMAL HIGH (ref 70–99)
Potassium: 4 mmol/L (ref 3.5–5.1)
Sodium: 133 mmol/L — ABNORMAL LOW (ref 135–145)

## 2023-07-10 MED ORDER — HYDROMORPHONE HCL 1 MG/ML IJ SOLN
0.5000 mg | Freq: Once | INTRAMUSCULAR | Status: AC
  Administered 2023-07-10: 0.5 mg via INTRAVENOUS
  Filled 2023-07-10: qty 0.5

## 2023-07-10 MED ORDER — OXYCODONE HCL 5 MG PO TABS: 5.0000 mg | ORAL_TABLET | ORAL | 0 refills | Status: DC | PRN

## 2023-07-10 MED ORDER — MORPHINE SULFATE (PF) 4 MG/ML IV SOLN
4.0000 mg | Freq: Once | INTRAVENOUS | Status: AC
  Administered 2023-07-10: 4 mg via INTRAVENOUS
  Filled 2023-07-10: qty 1

## 2023-07-10 MED ORDER — ONDANSETRON HCL 4 MG/2ML IJ SOLN
4.0000 mg | Freq: Once | INTRAMUSCULAR | Status: AC
  Administered 2023-07-10: 4 mg via INTRAVENOUS
  Filled 2023-07-10: qty 2

## 2023-07-10 NOTE — Discharge Instructions (Addendum)
It was a pleasure taking care of you today!  There were no concerning findings today with your labs.  It is important that you call your surgeon's office today to set up a follow-up appointment for next week.  You will be sent a short course of oxycodone, take as directed.  You may continue alternating Tylenol and ibuprofen for your symptoms  Follow-up with your primary care provider as needed.  Return to the emergency department if you are experiencing increasing/worsening symptoms.

## 2023-07-10 NOTE — ED Triage Notes (Signed)
Pt reports she had surgery on prolapsed hemorrhoids last week. Pt states she feel like the incision site is "ripping." Pt also reports an increase in pain to the area. Pt was instructed by her Dr to come to the ER for evaluation.

## 2023-07-10 NOTE — ED Provider Notes (Signed)
[place] EMERGENCY DEPARTMENT AT [facility] Provider Note   CSN: [id number] Arrival date & time: 07/10/23  0946     History  Chief Complaint  Patient presents with   Post-op Problem    Shelly Larson is a 52 y.o. female who presents to the ED with concerns for post-op problem onset 2-3 days. She notes that it feels like one of her incisions is ripped internally. Pt had surgery for prolapsed hemorrhoids on 07/04/23. She called her surgeons office yesterday due to concerns for persistent bleeding s/p procedure. She was instructed to come into the ED for further evaluation of her symptoms. Pt reports that she continues with having bright red blood in her bowel movements since the surgery. Has tried Colace, Linzess, tylenol, ibuprofen, for her symptoms with relief. Denies abdominal pain, constipation, fever, chest pain, shortness of breath, nausea, vomiting.   Per pt chart review: Pt had extensive hemorrhoidectomy surgery completed on 07/04/23 completed by Dr. Henreitta Leber.   The history is provided by the patient. No language interpreter was used.       Home Medications Prior to Admission medications   Medication Sig Start Date End Date Taking? Authorizing Provider  albuterol (VENTOLIN HFA) 108 (90 Base) MCG/ACT inhaler Inhale 2 puffs into the lungs every 4 (four) hours as needed for wheezing or shortness of breath.  10/17/17   [provider]  amitriptyline (ELAVIL) 25 MG tablet Take 3 tablets (75 mg total) by mouth at bedtime. 05/03/23 05/02/24  Antony Madura, MD  amLODipine (NORVASC) 10 MG tablet TAKE 1 TABLET BY MOUTH EVERY DAY 06/04/23   Allwardt, Alyssa M, PA-C  amphetamine-dextroamphetamine (ADDERALL XR) 30 MG 24 hr capsule Take 1 capsule (30 mg total) by mouth every morning. 07/08/23 08/07/23  Allwardt, Alyssa M, PA-C  amphetamine-dextroamphetamine (ADDERALL) 15 MG tablet Take 1 tab po around 2 pm Patient taking differently: Take 15 mg by mouth daily as needed (in the  afternoon as needed for focus/attention.). 10/09/22   Allwardt, Crist Infante, PA-C  Calcium Carb-Cholecalciferol (CALTRATE 600+D3 PO) Take 1 tablet by mouth in the morning.    [provider]  clonazePAM (KLONOPIN) 0.5 MG tablet TAKE 1 TABLET BY MOUTH 2 TIMES DAILY AS NEEDED FOR ANXIETY. 07/09/23   Allwardt, Crist Infante, PA-C  hydrocortisone (ANUSOL-HC) 2.5 % rectal cream Place 1 Application rectally 2 (two) times daily. Patient taking differently: Place 1 Application rectally 2 (two) times daily as needed. 07/04/23   Geoffery Lyons, MD  lidocaine (XYLOCAINE) 2 % jelly Apply 1 Application topically as needed. 07/04/23   Geoffery Lyons, MD  linaclotide Karlene Einstein) 290 MCG CAPS capsule Take 1 capsule (290 mcg total) by mouth daily before breakfast. 10/12/22   Aida Raider, NP  losartan-hydrochlorothiazide (HYZAAR) 100-25 MG tablet TAKE 1 TABLET BY MOUTH EVERY DAY 11/23/22   Allwardt, Crist Infante, PA-C  methylPREDNISolone (MEDROL DOSEPAK) 4 MG TBPK tablet Take 6tabs x1day, then 5tabs x1day, then 4tabs x1day, then 3tabs x1day, then 2tabs x1day, then 1tab x1day, then STOP Patient not taking: Reported on 07/04/2023 05/03/23   Antony Madura, MD  metoprolol succinate (TOPROL-XL) 50 MG 24 hr tablet Take 50 mg by mouth every morning.  09/19/17   [provider]  Na Sulfate-K Sulfate-Mg Sulf 17.5-3.13-1.6 GM/177ML SOLN As directed Patient not taking: Reported on 07/04/2023 03/28/23   Corbin Ade, MD  omeprazole (PRILOSEC) 40 MG capsule Take 1 capsule (40 mg total) by mouth daily. Patient taking differently: Take 40 mg by mouth every  evening. 03/19/23   Aida Raider, NP  ondansetron (ZOFRAN) 4 MG tablet Take 1 tablet (4 mg total) by mouth every 8 (eight) hours as needed. 07/04/23 07/03/24  Lucretia Roers, MD  oxyCODONE (ROXICODONE) 5 MG immediate release tablet Take 1 tablet (5 mg total) by mouth every 4 (four) hours as needed for breakthrough pain or severe pain. 07/10/23   Rayne Cowdrey A, PA-C   potassium chloride SA (KLOR-CON M) 20 MEQ tablet Take 2 tablets (40 mEq total) by mouth daily for 5 days. Patient not taking: Reported on 07/04/2023 03/29/23 04/10/23  Corbin Ade, MD  promethazine (PHENERGAN) 12.5 MG tablet Take 1 tablet (12.5 mg total) by mouth every 6 (six) hours as needed for nausea or vomiting. 05/03/23   Antony Madura, MD  Rhubarb (ESTROVEN MENOPAUSE RELIEF PO) Take 1 tablet by mouth daily in the afternoon.    [provider]  Rimegepant Sulfate (NURTEC) 75 MG TBDP Take 75 mg by mouth daily as needed. 10/27/22   Antony Madura, MD      Allergies    Lisinopril    Review of Systems   Review of Systems  All other systems reviewed and are negative.   Physical Exam Updated Vital Signs BP (!) 172/101 (BP Location: Left Arm)   Pulse 85   Temp 98.6 F (37 C) (Oral)   Resp (!) 22   SpO2 96%  Physical Exam Vitals and nursing note reviewed. Exam conducted with a chaperone present.  Constitutional:      General: She is not in acute distress.    Appearance: Normal appearance.  Eyes:     General: No scleral icterus.    Extraocular Movements: Extraocular movements intact.  Cardiovascular:     Rate and Rhythm: Normal rate.  Pulmonary:     Effort: Pulmonary effort is normal. No respiratory distress.  Abdominal:     Palpations: Abdomen is soft. There is no mass.     Tenderness: There is no abdominal tenderness.  Genitourinary:    Rectum: Tenderness and external hemorrhoid present. No anal fissure.     Comments: RN Georgann Housekeeper) chaperone present for rectal exam.  TTP noted to multiple external thrombosed hemorrhoids. With no active bleeding noted, however blood is present on rectal exam. Internal exam deferred in light of recent surgery. No appreciable anal fissure.  Musculoskeletal:        General: Normal range of motion.     Cervical back: Neck supple.  Skin:    General: Skin is warm and dry.     Findings: No rash.  Neurological:     Mental Status: She  is alert.     Sensory: Sensation is intact.     Motor: Motor function is intact.  Psychiatric:        Behavior: Behavior normal.     ED Results / Procedures / Treatments   Labs (all labs ordered are listed, but only abnormal results are displayed) Labs Reviewed  BASIC METABOLIC PANEL - Abnormal; Notable for the following components:      Result Value   Sodium 133 (*)    Glucose, Bld 116 (*)    Calcium 8.8 (*)    All other components within normal limits  CBC WITH DIFFERENTIAL/PLATELET - Abnormal; Notable for the following components:   RBC 5.12 (*)    All other components within normal limits    EKG None  Radiology No results found.  Procedures Procedures    Medications Ordered in ED  Medications  morphine (PF) 4 MG/ML injection 4 mg (4 mg Intravenous Given 07/10/23 1055)  ondansetron (ZOFRAN) injection 4 mg (4 mg Intravenous Given 07/10/23 1055)  HYDROmorphone (DILAUDID) injection 0.5 mg (0.5 mg Intravenous Given 07/10/23 1221)    ED Course/ Medical Decision Making/ A&P Clinical Course as of 07/10/23 1750  Tue Jul 10, 2023  1215 Pt re-evaluated and discussed with patient plans for surgery consult. Pt requesting additional pain medication at this time.  [SB]  1222 Consult with surgery Dr. Lovell Sheehan who recommends follow up in the office next week. [SB]  1252 Discussed with patient recommendations as per surgeon.  Patient requesting narcotics to go home with.  In-depth conversation held with patient at bedside regarding narcotics and how they can contribute to worsening constipation.  Patient notes that she is taking Linzess and Colace and would like to proceed with prescription narcotics.  Again discussed pros and cons of narcotic treatment with multiple external hemorrhoids.  Will provide patient with a short course of narcotics and instructed patient to follow-up with her surgeon regarding today's ED visit. [SB]    Clinical Course User Index [SB] Aeden Matranga A, PA-C                              Medical Decision Making Amount and/or Complexity of Data Reviewed Labs: ordered.  Risk Prescription drug management.   Pt presents with concerns for post op problem onset today. Vital signs, pt afebrile. On exam, pt with RN Georgann Housekeeper) chaperone present for rectal exam.  TTP noted to multiple external thrombosed hemorrhoids. With no active bleeding noted, however blood is present on rectal exam. Internal exam deferred in light of recent surgery. No appreciable anal fissure. No acute cardiovascular, respiratory, abdominal exam findings. Differential diagnosis includes external hemorrhoid, anal fissure.   Co morbidities that complicate the patient evaluation: HTN, DM  Additional history obtained:  External records from outside source obtained and reviewed including: Pt had extensive hemorrhoidectomy surgery completed on 07/04/23 completed by Dr. Henreitta Leber.  Labs:  I ordered, and personally interpreted labs.  The pertinent results include:   CBC unremarkable without concerns for acute anemia BMP unremarkable   Medications:  I ordered medication including Zofran, Dilaudid, and Morphine for symptom/pain management Reevaluation of the patient after these medicines and interventions, I reevaluated the patient and found that they have improved I have reviewed the patients home medicines and have made adjustments as needed  Consultations: I requested consultation with the General Surgery, Dr. Lovell Sheehan and discussed lab and imaging findings as well as pertinent plan - they recommend: continuing with sitz baths and treatment of hemorrhoids. Also recommends follow up in the office next week.   Disposition: Presentation suspicious for post op problem and external hemorrhoids. Doubt concerns for anal fissure. After consideration of the diagnostic results and the patients response to treatment, I feel that the patient would benefit from Discharge home. PDMP reviewed, patient  sent with a short course of oxycodone. Advised to continue with hemorrhoid treatments as previously recommended. Instructed to follow up with her surgeon next week regarding todays ED visit. Supportive care measures and strict return precautions discussed with patient at bedside. Pt acknowledges and verbalizes understanding. Pt appears safe for discharge. Follow up as indicated in discharge paperwork.    This chart was dictated using voice recognition software, Dragon. Despite the best efforts of this provider to proofread and correct errors, errors may still occur which can  change documentation meaning.   Final Clinical Impression(s) / ED Diagnoses Final diagnoses:  Post-op pain  External hemorrhoid    Rx / DC Orders ED Discharge Orders          Ordered    oxyCODONE (ROXICODONE) 5 MG immediate release tablet  Every 4 hours PRN       Note to Pharmacy: Please call if prescription not available. Dr. Henreitta Leber- [phone]   07/10/23 1316              Dawnette Mione A, PA-C 07/10/23 1750    Bethann Berkshire, MD 07/11/23 1015

## 2023-07-12 ENCOUNTER — Telehealth: Payer: Self-pay | Admitting: Family Medicine

## 2023-07-12 ENCOUNTER — Other Ambulatory Visit: Payer: Self-pay | Admitting: General Surgery

## 2023-07-12 ENCOUNTER — Telehealth: Payer: Self-pay | Admitting: Physician Assistant

## 2023-07-12 ENCOUNTER — Ambulatory Visit: Payer: Managed Care, Other (non HMO) | Admitting: Physician Assistant

## 2023-07-12 DIAGNOSIS — Z09 Encounter for follow-up examination after completed treatment for conditions other than malignant neoplasm: Secondary | ICD-10-CM

## 2023-07-12 MED ORDER — OXYCODONE HCL 5 MG PO TABS: 5.0000 mg | ORAL_TABLET | ORAL | 0 refills | Status: DC | PRN

## 2023-07-12 NOTE — Telephone Encounter (Signed)
See note

## 2023-07-12 NOTE — Telephone Encounter (Signed)
FYI: This call has been transferred to triage nurse: the Triage Nurse. Once the result note has been entered staff can address the message at that time.  Patient called in with the following symptoms:  Red Word: Nausea , more blood loss following surgery on 7/10      Please advise at Mobile [phone] (mobile)  Message is routed to Provider Pool.

## 2023-07-12 NOTE — Telephone Encounter (Signed)
Patient was scheduled for 07/12/23 at 8am-arrived late-was not able to be seen-no available appointments for today. Per Patient, she will follow up with Surgeon.  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 Translation No Nurse Assessment Nurse: Gasper Sells, RN, Marylu Lund Date/Time Lamount Cohen Time): 07/12/2023 8:29:23 AM Confirm and document reason for call. If symptomatic, describe symptoms. ---Caller states she had surgery for a hemorrhoid and is still experiencing bleeding with nausea. Stage 4 hemorrhoidectomy. Aug. 8th is surgery f/u appt. Was late this a.m. to office and must reschedule. ER Ilda Foil, said to call surgeon and PCP. Labs done. In pain. Does the patient have any new or worsening symptoms? ---Yes Will a triage be completed? ---Yes Related visit to physician within the last 2 weeks? ---Yes Does the PT have any chronic conditions? (i.e. diabetes, asthma, this includes High risk factors for pregnancy, etc.) ---Yes List chronic conditions. ---hemorrhoidectomy 7/10, HTN, Colon CA Is the patient pregnant or possibly pregnant? (Ask all females between the ages of 53-55) ---No Is this a behavioral health or substance abuse call?  ---No Guidelines Guideline Title Affirmed Question Affirmed Notes Nurse Date/Time (Eastern Time) Post-Op Symptoms and Questions [1] SEVERE postop pain (e.g., excruciating, pain Quentin Cornwall 07/12/2023 8:36:15 AM  Guidelines Guideline Title Affirmed Question Affirmed Notes Nurse Date/Time Lamount Cohen Time) scale 8-10) AND [2] not controlled with pain medications Disp. Time Lamount Cohen Time) Disposition Final User 07/12/2023 8:38:41 AM Call PCP Now Yes Gasper Sells, RN, Marylu Lund Final Disposition 07/12/2023 8:38:41 AM Call PCP Now Yes Gasper Sells, RN, Herbert Pun Disagree/Comply Comply Caller Understands No PreDisposition Call Doctor Care Advice Given Per Guideline CALL PCP NOW: * You need to discuss this with your doctor (or NP/PA). * I'll page the on-call provider now. If you haven't heard from the provider (or me) within 30 minutes, call again. ALTERNATE DISPOSITION - CALL SURGEON NOW: * For most post-operative symptoms and questions, it is better to speak with the surgeon than the PCP. PAIN MEDICINES - EXTRA NOTES AND WARNINGS: * Use the lowest amount of medicine that makes your pain better. * Acetaminophen is thought to be safer than ibuprofen or naproxen in people over 30 years old. Acetaminophen is in many OTC and prescription medicines. It might be in more than one medicine that you are taking. You need to be careful and not take an overdose. An acetaminophen overdose can hurt the liver. CARE ADVICE p  Comments User: Jason Coop, RN Date/Time Lamount Cohen Time): 07/12/2023 8:39:53 AM advised to call surgeon re' pain meds, and f/u visit within 24 hours. If bleeding worsens, go to ER.UV

## 2023-07-12 NOTE — Telephone Encounter (Signed)
Patient came into office requesting a refill on pain medication. She did go to ER as we suggested and they gave her 10 pills but it is not helping. She did make an apt with her PCP however she was 12 minutes late and they refused to see her so she came to our office. She is using tylenol and ibuprofen along with the oxy with minimal relief. She is doing sitz baths, using preporation H spray and sitting on a doughnut. She is aware that it is suppose to be painful but she can not sleep the pain is so great.   Per Dr. Lovell Sheehan he will refill her RX and she can pick up tomorrow but to continue rotating tylenol and ibuprofen.  Patient aware and verbalizes understanding.

## 2023-07-12 NOTE — Progress Notes (Unsigned)
Ordered more roxicodone as patient was only given ten pills in ER.

## 2023-07-16 ENCOUNTER — Encounter (HOSPITAL_BASED_OUTPATIENT_CLINIC_OR_DEPARTMENT_OTHER): Payer: Self-pay

## 2023-07-16 ENCOUNTER — Other Ambulatory Visit: Payer: Self-pay

## 2023-07-16 ENCOUNTER — Emergency Department (HOSPITAL_BASED_OUTPATIENT_CLINIC_OR_DEPARTMENT_OTHER)
Admission: EM | Admit: 2023-07-16 | Discharge: 2023-07-16 | Disposition: A | Discharge status: Home / Self Care | Payer: Managed Care, Other (non HMO) | Attending: Emergency Medicine | Admitting: Emergency Medicine

## 2023-07-16 ENCOUNTER — Telehealth: Payer: Self-pay | Admitting: Physician Assistant

## 2023-07-16 ENCOUNTER — Telehealth: Payer: Self-pay | Admitting: Family Medicine

## 2023-07-16 ENCOUNTER — Emergency Department (HOSPITAL_BASED_OUTPATIENT_CLINIC_OR_DEPARTMENT_OTHER): Payer: Managed Care, Other (non HMO)

## 2023-07-16 DIAGNOSIS — K6289 Other specified diseases of anus and rectum: Secondary | ICD-10-CM | POA: Diagnosis not present

## 2023-07-16 DIAGNOSIS — T8189XA Other complications of procedures, not elsewhere classified, initial encounter: Secondary | ICD-10-CM | POA: Insufficient documentation

## 2023-07-16 DIAGNOSIS — Z79899 Other long term (current) drug therapy: Secondary | ICD-10-CM | POA: Insufficient documentation

## 2023-07-16 DIAGNOSIS — K9189 Other postprocedural complications and disorders of digestive system: Secondary | ICD-10-CM

## 2023-07-16 DIAGNOSIS — K649 Unspecified hemorrhoids: Secondary | ICD-10-CM | POA: Diagnosis present

## 2023-07-16 DIAGNOSIS — K644 Residual hemorrhoidal skin tags: Secondary | ICD-10-CM | POA: Diagnosis not present

## 2023-07-16 DIAGNOSIS — I10 Essential (primary) hypertension: Secondary | ICD-10-CM | POA: Diagnosis not present

## 2023-07-16 LAB — CBC WITH DIFFERENTIAL/PLATELET
Abs Immature Granulocytes: 0.04 10*3/uL (ref 0.00–0.07)
Basophils Absolute: 0 10*3/uL (ref 0.0–0.1)
Basophils Relative: 0 %
Eosinophils Absolute: 0.1 10*3/uL (ref 0.0–0.5)
Eosinophils Relative: 2 %
HCT: 40.6 % (ref 36.0–46.0)
Hemoglobin: 13.2 g/dL (ref 12.0–15.0)
Immature Granulocytes: 1 %
Lymphocytes Relative: 18 %
Lymphs Abs: 1.5 10*3/uL (ref 0.7–4.0)
MCH: 28.2 pg (ref 26.0–34.0)
MCHC: 32.5 g/dL (ref 30.0–36.0)
MCV: 86.8 fL (ref 80.0–100.0)
Monocytes Absolute: 0.5 10*3/uL (ref 0.1–1.0)
Monocytes Relative: 6 %
Neutro Abs: 6 10*3/uL (ref 1.7–7.7)
Neutrophils Relative %: 73 %
Platelets: 162 10*3/uL (ref 150–400)
RBC: 4.68 MIL/uL (ref 3.87–5.11)
RDW: 14.6 % (ref 11.5–15.5)
WBC: 8.2 10*3/uL (ref 4.0–10.5)
nRBC: 0 % (ref 0.0–0.2)

## 2023-07-16 LAB — BASIC METABOLIC PANEL
Anion gap: 8 (ref 5–15)
BUN: 11 mg/dL (ref 6–20)
CO2: 26 mmol/L (ref 22–32)
Calcium: 9.3 mg/dL (ref 8.9–10.3)
Chloride: 103 mmol/L (ref 98–111)
Creatinine, Ser: 0.67 mg/dL (ref 0.44–1.00)
GFR, Estimated: >60 mL/min (ref >60)
Glucose, Bld: 109 mg/dL — ABNORMAL HIGH (ref 70–99)
Potassium: 3.3 mmol/L — ABNORMAL LOW (ref 3.5–5.1)
Sodium: 137 mmol/L (ref 135–145)

## 2023-07-16 LAB — HEMOGLOBIN AND HEMATOCRIT, BLOOD
HCT: 38.8 % (ref 36.0–46.0)
Hemoglobin: 12.6 g/dL (ref 12.0–15.0)

## 2023-07-16 LAB — HCG, QUANTITATIVE, PREGNANCY: hCG, Beta Chain, Quant, S: <1 m[IU]/mL (ref <5)

## 2023-07-16 MED ORDER — HYDROMORPHONE HCL 1 MG/ML IJ SOLN
1.0000 mg | Freq: Once | INTRAMUSCULAR | Status: AC
  Administered 2023-07-16: 1 mg via INTRAVENOUS
  Filled 2023-07-16: qty 1

## 2023-07-16 MED ORDER — LIDOCAINE-EPINEPHRINE-TETRACAINE (LET) TOPICAL GEL
3.0000 mL | Freq: Once | TOPICAL | Status: AC
  Administered 2023-07-16: 3 mL via TOPICAL
  Filled 2023-07-16: qty 3

## 2023-07-16 MED ORDER — HYDROMORPHONE HCL 1 MG/ML IJ SOLN
0.5000 mg | Freq: Once | INTRAMUSCULAR | Status: AC
  Administered 2023-07-16: 0.5 mg via INTRAVENOUS
  Filled 2023-07-16: qty 1

## 2023-07-16 MED ORDER — OXYCODONE HCL 5 MG PO TABS: 5.0000 mg | ORAL_TABLET | ORAL | 0 refills | Status: DC | PRN

## 2023-07-16 MED ORDER — IOHEXOL 300 MG/ML  SOLN
100.0000 mL | Freq: Once | INTRAMUSCULAR | Status: AC | PRN
  Administered 2023-07-16: 100 mL via INTRAVENOUS

## 2023-07-16 NOTE — Discharge Instructions (Addendum)
Attempt to keep the gauze in place as well as use pads to the area it is bleeding.  Call Dr. Henreitta Leber office tomorrow morning when they first open.  If you have significant worsening bleeding, large clots, lightheadedness, dizziness you need to be seen [facility]  Return for new or worsening symptoms

## 2023-07-16 NOTE — Telephone Encounter (Signed)
Patient called and has returned to work today after hemorrhoid surgery on 7/10. 07/12/2023 she called and was complaining of pain and needed more pain pills. Dr. Lovell Sheehan sent in some pain medication with the instructions for her to rotate tylenol and ibuprofen along with the pain medication on a regular basis.  She was doing well until she returned to work today and now having rectal bleeding through her pants onto her chair. She was/has not done anything strenuous today. She called her pcp and they recommended she call our office and in the meantime apply pressure and an ice pack to stop the bleeding. It has not helped as she is actively bleeding (More then she thinks she should be, soaking through her clothes) and her pain is quickly escalating.   We do not have a provider in office today and instructed the patient if she is still bleeding through her clothes and she thinks it is excessive then she needs to go back to the ER to be checked and if needed a surgeon will be consulted. Pt verbalizes understanding and will go ER.

## 2023-07-16 NOTE — Telephone Encounter (Signed)
FYI: This call has been transferred to triage nurse: the Triage Nurse. Once the result note has been entered staff can address the message at that time.  Patient called in with the following symptoms:  Red Word: Unable to stop bleeding - blood soaked through clothing (had Hemorrhoid surgery 07/04/23   Please advise at Mobile [phone] (mobile)  Message is routed to Provider Pool.

## 2023-07-16 NOTE — ED Triage Notes (Signed)
Patient here POV from Home.  Endorses Hemorrhoid Operation that was completed 12 Days ago. Since then has had continued pain and bleeding (Dark with Clots). No Change with BM.   No Anticoagulants.   NAD Noted during Triage. A&Ox4. GCS 15. Ambulatory.

## 2023-07-16 NOTE — ED Provider Notes (Signed)
[place] EMERGENCY DEPARTMENT AT [facility] Provider Note   CSN: [id number] Arrival date & time: 07/16/23  1401    History  Chief Complaint  Patient presents with   Post-op Problem    Shelly Larson is a 52 y.o. female here for evaluation of rectal plain and bleeding.  Patient had extensive hemorrhoidectomy on 07/04/2023 with Dr. Henreitta Leber at [facility].  He was seen again in the emergency department on 07/10/2023 for pain.  States at that point she has had some bright red bleeding with bowel movements however this quickly resolved.  She received additional pain medicine from Dr. Lovell Sheehan with general surgery on 07/12/2023.  Today she went back to work.  She has some pain to her rectum and noted while she was at work she folic her pants were cool, stood up and she was sitting in a pool of blood.  She also noted large clots.  She denies any lightheadedness or dizziness.  Does have history of hypertension compliant with her meds.  Thinks it is elevated today due to pain.  States when she was seen in 07/10/2023 she thought something had "split open."  She was not seen in the office at that time.  She denies headache, nausea, vomiting, abdominal pain, lightheadedness, dizziness, hematuria or dysuria.  She denies any blood thinners.  No recent strenuous activity, lifting heavy objects, heart palpating for movements.  She is on Linzess and states she has had "normal" bowel movements without increase in pain and bleeding since the surgery.  She called her surgeon who was out of town and denies being in the office this year she is upset and currently called her PCP who told her to go to the nearest emergency department.  HPI     Home Medications Prior to Admission medications   Medication Sig Start Date End Date Taking? Authorizing Provider  oxyCODONE (ROXICODONE) 5 MG immediate release tablet Take 1 tablet (5 mg total) by mouth every 4 (four) hours as needed for severe pain. 07/16/23   Yes Levonne Carreras A, PA-C  albuterol (VENTOLIN HFA) 108 (90 Base) MCG/ACT inhaler Inhale 2 puffs into the lungs every 4 (four) hours as needed for wheezing or shortness of breath.  10/17/17   [provider]  amitriptyline (ELAVIL) 25 MG tablet Take 3 tablets (75 mg total) by mouth at bedtime. 05/03/23 05/02/24  Antony Madura, MD  amLODipine (NORVASC) 10 MG tablet TAKE 1 TABLET BY MOUTH EVERY DAY 06/04/23   Allwardt, Alyssa M, PA-C  amphetamine-dextroamphetamine (ADDERALL XR) 30 MG 24 hr capsule Take 1 capsule (30 mg total) by mouth every morning. 07/08/23 08/07/23  Allwardt, Alyssa M, PA-C  amphetamine-dextroamphetamine (ADDERALL) 15 MG tablet Take 1 tab po around 2 pm Patient taking differently: Take 15 mg by mouth daily as needed (in the afternoon as needed for focus/attention.). 10/09/22   Allwardt, Crist Infante, PA-C  Calcium Carb-Cholecalciferol (CALTRATE 600+D3 PO) Take 1 tablet by mouth in the morning.    [provider]  clonazePAM (KLONOPIN) 0.5 MG tablet TAKE 1 TABLET BY MOUTH 2 TIMES DAILY AS NEEDED FOR ANXIETY. 07/09/23   Allwardt, Crist Infante, PA-C  hydrocortisone (ANUSOL-HC) 2.5 % rectal cream Place 1 Application rectally 2 (two) times daily. Patient taking differently: Place 1 Application rectally 2 (two) times daily as needed. 07/04/23   Geoffery Lyons, MD  lidocaine (XYLOCAINE) 2 % jelly Apply 1 Application topically as needed. 07/04/23   Geoffery Lyons, MD  linaclotide Karlene Einstein) 290 MCG CAPS capsule  Take 1 capsule (290 mcg total) by mouth daily before breakfast. 10/12/22   Aida Raider, NP  losartan-hydrochlorothiazide (HYZAAR) 100-25 MG tablet TAKE 1 TABLET BY MOUTH EVERY DAY 11/23/22   Allwardt, Crist Infante, PA-C  methylPREDNISolone (MEDROL DOSEPAK) 4 MG TBPK tablet Take 6tabs x1day, then 5tabs x1day, then 4tabs x1day, then 3tabs x1day, then 2tabs x1day, then 1tab x1day, then STOP Patient not taking: Reported on 07/04/2023 05/03/23   Antony Madura, MD  metoprolol succinate  (TOPROL-XL) 50 MG 24 hr tablet Take 50 mg by mouth every morning.  09/19/17   [provider]  Na Sulfate-K Sulfate-Mg Sulf 17.5-3.13-1.6 GM/177ML SOLN As directed Patient not taking: Reported on 07/04/2023 03/28/23   Corbin Ade, MD  omeprazole (PRILOSEC) 40 MG capsule Take 1 capsule (40 mg total) by mouth daily. Patient taking differently: Take 40 mg by mouth every evening. 03/19/23   Aida Raider, NP  ondansetron (ZOFRAN) 4 MG tablet Take 1 tablet (4 mg total) by mouth every 8 (eight) hours as needed. 07/04/23 07/03/24  Lucretia Roers, MD  potassium chloride SA (KLOR-CON M) 20 MEQ tablet Take 2 tablets (40 mEq total) by mouth daily for 5 days. Patient not taking: Reported on 07/04/2023 03/29/23 04/10/23  Corbin Ade, MD  promethazine (PHENERGAN) 12.5 MG tablet Take 1 tablet (12.5 mg total) by mouth every 6 (six) hours as needed for nausea or vomiting. 05/03/23   Antony Madura, MD  Rhubarb (ESTROVEN MENOPAUSE RELIEF PO) Take 1 tablet by mouth daily in the afternoon.    [provider]  Rimegepant Sulfate (NURTEC) 75 MG TBDP Take 75 mg by mouth daily as needed. 10/27/22   Antony Madura, MD      Allergies    Lisinopril    Review of Systems   Review of Systems  Constitutional: Negative.   HENT: Negative.    Respiratory: Negative.    Cardiovascular: Negative.   Gastrointestinal:  Positive for blood in stool and rectal pain. Negative for abdominal distention, abdominal pain, anal bleeding, constipation, diarrhea, nausea and vomiting.  Genitourinary: Negative.   Musculoskeletal: Negative.   Skin: Negative.   Neurological: Negative.   All other systems reviewed and are negative.   Physical Exam Updated Vital Signs BP (!) 174/125 (BP Location: Left Wrist)   Pulse 93   Temp 98.1 F (36.7 C) (Oral)   Resp 12   Ht 5\' 10"  (1.778 m)   Wt 130.6 kg   SpO2 97%   BMI 41.32 kg/m  Physical Exam Vitals and nursing note reviewed. Exam conducted with a chaperone present.   Constitutional:      General: She is not in acute distress.    Appearance: She is well-developed. She is not ill-appearing, toxic-appearing or diaphoretic.  HENT:     Head: Normocephalic and atraumatic.     Nose: Nose normal.     Mouth/Throat:     Mouth: Mucous membranes are moist.  Eyes:     Pupils: Pupils are equal, round, and reactive to light.  Cardiovascular:     Rate and Rhythm: Normal rate.     Pulses: Normal pulses.     Heart sounds: Normal heart sounds.  Pulmonary:     Effort: Pulmonary effort is normal. No respiratory distress.  Abdominal:     General: Bowel sounds are normal. There is no distension.     Palpations: Abdomen is soft.  Genitourinary:    Vagina: Normal.     Cervix: Normal.  Rectum: Guaiac result positive. Tenderness and external hemorrhoid present.     Comments: GU exam with chaperone Banker) in room.  Moderate amount of bleeding with clot approximately 4 inches x1 cm in length.  Slow oozing of blood however cannot identify location of bleeding.  She has what appears to be external thrombosed hemorrhoids cannot perform manual internal exam due to patient pain/compliance with exam.  No obvious fluctuance, induration to suggest infection.  Speculum exam no vaginal bleeding, cervix closed. Musculoskeletal:        General: Normal range of motion.     Cervical back: Normal range of motion.  Skin:    General: Skin is warm and dry.  Neurological:     General: No focal deficit present.     Mental Status: She is alert.  Psychiatric:        Mood and Affect: Mood normal.    ED Results / Procedures / Treatments   Labs (all labs ordered are listed, but only abnormal results are displayed) Labs Reviewed  BASIC METABOLIC PANEL - Abnormal; Notable for the following components:      Result Value   Potassium 3.3 (*)    Glucose, Bld 109 (*)    All other components within normal limits  CBC WITH DIFFERENTIAL/PLATELET  HCG, QUANTITATIVE, PREGNANCY  HEMOGLOBIN  AND HEMATOCRIT, BLOOD    EKG None  Radiology CT ANGIO GI BLEED  Result Date: 07/16/2023 CLINICAL DATA:  Moderate rectal bleeding. Hemorrhoidal surgery 2 weeks ago. History of colon cancer. EXAM: CTA ABDOMEN AND PELVIS WITHOUT AND WITH CONTRAST TECHNIQUE: Multidetector CT imaging of the abdomen and pelvis was performed using the standard protocol during bolus administration of intravenous contrast. Multiplanar reconstructed images and MIPs were obtained and reviewed to evaluate the vascular anatomy. RADIATION DOSE REDUCTION: This exam was performed according to the departmental dose-optimization program which includes automated exposure control, adjustment of the mA and/or kV according to patient size and/or use of iterative reconstruction technique. CONTRAST:  OMNIPAQUE IOHEXOL 300 MG/ML  SOLN COMPARISON:  CT abdomen pelvis-09/06/2021 FINDINGS: VASCULAR Aorta: Normal caliber of the abdominal aorta. No evidence of abdominal aortic dissection or perivascular stranding. Celiac: Widely patent without hemodynamically significant narrowing. Conventional branching pattern. SMA: Widely patent without hemodynamically significant narrowing. Conventional branching pattern. The distal tributaries of the SMA appear widely patent without discrete lumen filling defects to suggest distal embolism. Renals: Solitary bilaterally; the bilateral renal arteries are widely patent without hemodynamically significant narrowing. No vessel irregularity to suggest FMD. IMA: Remains patent. Inflow: The bilateral common, external and internal iliac arteries are of normal caliber and widely patent without hemodynamically significant narrowing. Proximal Outflow: The bilateral common and imaged portions of the bilateral deep and superficial femoral arteries are of normal caliber and without hemodynamically significant narrowing. Veins: The IVC and pelvic venous systems appear widely patent. Review of the MIP images confirms the above  findings. _________________________________________________________ NON-VASCULAR Lower chest: Limited visualization of the lower thorax demonstrates minimal right basilar subsegmental atelectasis. Minimal dependent subpleural ground-glass atelectasis. No pleural effusion. Cardiomegaly.  No pericardial effusion. Hepatobiliary: Normal hepatic contour. Postcholecystectomy. There is mild dilatation of the CBD and intrahepatic biliary system, similar to the 08/2021 examination and presumably the sequela biliary reservoir phenomena in the setting of postcholecystectomy state. No ascites. Pancreas: Normal appearance of the pancreas. Spleen: Normal appearance of the spleen. Adrenals/Urinary Tract: There is symmetric enhancement of the bilateral kidneys. No evidence of nephrolithiasis. No discrete renal lesions. No urinary obstruction or perinephric stranding. Normal appearance of  the bilateral adrenal glands. Normal appearance of the urinary bladder given degree of distention. Stomach/Bowel: Post colonic anastomosis at the level of the splenic flexure of the colon. Moderate-to-large colonic stool burden without evidence of enteric obstruction. Normal appearance of the terminal ileum and the retrocecal appendix. No hiatal hernia. There are no discrete areas of intraluminal contrast extravasation to suggest acute GI bleeding. Lymphatic: No bulky retroperitoneal, mesenteric, pelvic or inguinal lymphadenopathy. Reproductive: Note is made of a 2.9 cm presumably physiologic left-sided adnexal cyst. No discrete right-sided adnexal lesions. No discrete adnexal lesions. Other: Subcutaneous edema about the midline of the low back. Well-healed midline abdominal incision without evidence of significant hernia. Musculoskeletal: No acute or aggressive osseous abnormalities. Stigmata of dish within the lower thoracic spine. Mild DDD of L5-S1 with disc space height loss, endplate irregularity and sclerosis. IMPRESSION: 1. No acute findings  within the abdomen or pelvis. Specifically, no discrete areas of intraluminal contrast extravasation to suggest GI bleeding. 2. Post colonic anastomosis at the level of the splenic flexure of the colon. 3. Moderate-to-large colonic stool burden without evidence of enteric obstruction. 4. Cardiomegaly. Electronically Signed   By: Simonne Come M.D.   On: 07/16/2023 16:49    Procedures Procedures    Medications Ordered in ED Medications  lidocaine-EPINEPHrine-tetracaine (LET) topical gel (3 mLs Topical Given 07/16/23 1512)  HYDROmorphone (DILAUDID) injection 0.5 mg (0.5 mg Intravenous Given 07/16/23 1444)  HYDROmorphone (DILAUDID) injection 1 mg (1 mg Intravenous Given 07/16/23 1522)  iohexol (OMNIPAQUE) 300 MG/ML solution 100 mL (100 mLs Intravenous Contrast Given 07/16/23 1600)  HYDROmorphone (DILAUDID) injection 1 mg (1 mg Intravenous Given 07/16/23 1821)   ED Course/ Medical Decision Making/ A&P Clinical Course as of 07/16/23 2031  Mon Jul 16, 2023  1710 CONSULT with Dr. Robyne Peers with gen surgery. [BH]  1732 Discussed with general surgery ([place]) again.  Recommend packing with surgicel/ combat gauze and FU in office tomorrow. Strict return precautions. [BH]    Clinical Course User Index [BH] Micheline Markes A, PA-C   52 year old recent extensive hemorrhoidectomy surgery with Dr. Henreitta Leber at [facility] on 07/03/2021 here for evaluation of pain and rectal bleeding.  She feels this is coming from her recent hemorrhoid surgery.  Went back to work today.  No recent injury, trauma to the area, recent strenuous activity, hard bowel movements, heavy lifting.  No lightheadedness or dizziness.  Unfortunately no provider in her general surgery office today to see her.  She called her PCP who recommended she come here to the emergency department.  She does show me a picture of a clot from earlier.  On exam she has some tenderness to her rectum external hemorrhoids present however significantly improved from  prior pictures in EPIC. She does note that she has internal hemorrhoids that were not removed with general surgery. Had prior ablation, has not had menstrual cycle over the last 2 years.  Pelvic exam shows no vaginal bleeding.  Cervix closed.  Suspect bleeding likely from rectum.  Difficult exam, does have persistent, oozing however difficult to tell exactly what is coming from, appears to be internal rectum however difficult exam due to patient compliance/pain.  Labs and imaging personally viewed and interpreted:  CBC without leukocytosis, hemoglobin 13.2 Metabolic panel potassium 3.3 Pregnancy test negative CTA GI bleed without significant abnormality Hgb 12.6 3- hours post initial   Discussed results with patient.  Did place pad to the area which she has had some bleeding to the area.  No additional large clots. Will discuss  with general surgery at [facility] given they performed her hemorrhoidectomy  See clinical course.  Discussed with general surgery.  They do not feel patient needs admission or surgical intervention at this time.  They do recommend packing with what we have here at [facility] (combat gauze) as well as patient can tolerate and follow-up in office tomorrow morning.  Strict return precautions to follow-up any pending symptoms worsen.  Repeat hemoglobin here 12.6  I discussed with patient.  She feels comfortable with this.  She has no lightheadedness or dizziness.  Attempted to place, because per general surgery recommendation.  Patient did have significant difficulty with this due to pain.   I had a long and thorough discussion with patient.  If she continues to have worsening symptoms, syncope, lightheadedness, dizziness, large clots she needs to return for further evaluation otherwise she needs to call her general surgeon in the morning further recommendations for office visit.  The patient has been appropriately medically screened and/or stabilized in the ED. I have low suspicion  for any other emergent medical condition which would require further screening, evaluation or treatment in the ED or require inpatient management.  Patient is hemodynamically stable and in no acute distress.  Patient able to ambulate in department prior to ED.  Evaluation does not show acute pathology that would require ongoing or additional emergent interventions while in the emergency department or further inpatient treatment.  I have discussed the diagnosis with the patient and answered all questions.  Pain is been managed while in the emergency department and patient has no further complaints prior to discharge.  Patient is comfortable with plan discussed in room and is stable for discharge at this time.  I have discussed strict return precautions for returning to the emergency department.  Patient was encouraged to follow-up with PCP/specialist refer to at discharge.                            Medical Decision Making Amount and/or Complexity of Data Reviewed Independent Historian: friend External Data Reviewed: labs, radiology and notes. Labs: ordered. Decision-making details documented in ED Course. Radiology: ordered and independent interpretation performed. Decision-making details documented in ED Course.  Risk OTC drugs. Prescription drug management. Parenteral controlled substances. Decision regarding hospitalization. Diagnosis or treatment significantly limited by social determinants of health.          Final Clinical Impression(s) / ED Diagnoses Final diagnoses:  Bleeding hemorrhoid  Other postoperative complication involving digestive system    Rx / DC Orders ED Discharge Orders          Ordered    oxyCODONE (ROXICODONE) 5 MG immediate release tablet  Every 4 hours PRN        07/16/23 1841              Shanitra Phillippi A, PA-C 07/16/23 2031    Cathren Laine, MD 07/17/23 1436

## 2023-07-16 NOTE — Telephone Encounter (Signed)
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 Site is bleeding today. GOTO Facility Not Listed surgeon's office Translation No Nurse Assessment Nurse: Clarita Leber, RN, Gavin Pound Date/Time (Eastern Time): 07/16/2023 9:29:24 AM Confirm and document reason for call. If symptomatic, describe symptoms. ---Caller has stage 4 hemorrhoids and had surgery on 7/10. Was told to the ED recently and they explained her why she was hurting on 7/18. She was told that everything looked normal. Went to work today and did not wear a napkin. Sitting in the chair and she felt a cold wet feeling and she had bled all over. Does the patient have any new or worsening symptoms? ---Yes Will a triage be completed? ---Yes Related visit to physician within the last 2 weeks? ---Yes Does the PT have any chronic conditions? (i.e. diabetes, asthma, this includes High risk factors for pregnancy, etc.) ---Yes List chronic conditions. ---S/P colon cancer - transverse in 2016, colonoscopy this year, hypertension Is the patient pregnant or possibly pregnant? (Ask all females between the ages of 45-55) ---No Is this a behavioral health or substance abuse call? ---No  Guidelines Guideline Title Affirmed Question Affirmed  Notes Nurse Date/Time (Eastern Time) Rectal Bleeding MODERATE rectal bleeding (small blood clots, passing blood without stool, or toilet water turns red) Womble, RN, Gavin Pound 07/16/2023 9:35:05 AM Disp. Time Lamount Cohen Time) Disposition Final User 07/16/2023 9:43:24 AM See PCP within 24 Hours Yes Clarita Leber, RN, Gavin Pound Final Disposition 07/16/2023 9:43:24 AM See PCP within 24 Hours Yes Womble, RN, Jetty Duhamel Disagree/Comply Comply Caller Understands Yes PreDisposition Call Doctor Care Advice Given Per Guideline SEE PCP WITHIN 24 HOURS: * You become worse  Comments User: Alita Chyle, RN Date/Time Lamount Cohen Time): 07/16/2023 9:47:49 AM The caller returned to work today after hemorrhoid surgery on 7/10. Went to follow up last week but surgeon was on vacation and other surgeon was in surgery so she was not actually seen. Had been doing well until she returned to work and now having rectal bleeding through pants. She was advised to hold constant pressure for 10 minutes to help stop the bleeding and afterwards can put ice pack. She was instructed to call her surgeon's office and see if she can be seen and she verbalized understanding. Referrals GO TO FACILITY OTHER - SPECIFY

## 2023-07-16 NOTE — Telephone Encounter (Signed)
FYI, see Triage note. 

## 2023-07-17 ENCOUNTER — Encounter: Payer: Self-pay | Admitting: General Surgery

## 2023-07-17 ENCOUNTER — Ambulatory Visit (INDEPENDENT_AMBULATORY_CARE_PROVIDER_SITE_OTHER): Payer: Managed Care, Other (non HMO) | Admitting: General Surgery

## 2023-07-17 ENCOUNTER — Ambulatory Visit: Payer: Managed Care, Other (non HMO) | Admitting: Physician Assistant

## 2023-07-17 VITALS — BP 188/113 | HR 92 | Temp 98.1°F | Resp 18 | Ht 70.0 in | Wt 289.0 lb

## 2023-07-17 DIAGNOSIS — K648 Other hemorrhoids: Secondary | ICD-10-CM

## 2023-07-17 MED ORDER — OXYCODONE HCL 5 MG PO TABS: 5.0000 mg | ORAL_TABLET | ORAL | 0 refills | Status: DC | PRN

## 2023-07-17 NOTE — Patient Instructions (Addendum)
  Tylenol 1000mg  @ 6am, 12noon, 6pm, (Do not exceed 4000mg  of tylenol a day). Ibuprofen 800mg  @ 9am, 3pm, 9pm, 3am (Do not exceed 3600mg  of ibuprofen a day).  Take Roxicodone 5-10 for breakthrough pain every 4 hours.  Some bleeding is expected. Sitz baths to continue   Get lab work on Monday 07/23/23.

## 2023-07-17 NOTE — Progress Notes (Signed)
[facility]  Patient with pain and bleeding after hemorrhoidectomy for extensive hemorrhoids that were prolapsed. Pathology with them all thrombosed. She has had pain and bleeding and went to the ED twice. She is doing tylenol and ibuprofen scheduled. Roxicodone 5mg  is not helping her and her pain is still at an 8 most of the time. She is on linzess and colace and having regular soft stools. She is doing sitz baths every 3 hours.   The ED called my partner yesterday and her H&H had drifted some. We planned to see her today to check on her and her bleeding.  She reports less bleeding/ clots.  BP (!) 188/113   Pulse 92   Temp 98.1 F (36.7 C) (Oral)   Resp 18   Ht 5\' 10"  (1.778 m)   Wt 289 lb (131.1 kg)   SpO2 93%   BMI 41.47 kg/m  Small tag/ hemorrhoidal tissue I could not excise at time of surgery remains, area soft, no signs of abscess/ infection, minor expected mucus drainage, no bleeding  Patient s/p extensive open hemorrhoidectomy (no sutures). Doing poorly with pain and bleeding. H&H drifted some but is still wnl. She is experiencing post op pain and bleeding that is in the spectrum of normal. Discussed pain and expectation after hemorrhoid surgery again.   Tylenol 1000mg  @ 6am, 12noon, 6pm, (Do not exceed 4000mg  of tylenol a day). Ibuprofen 800mg  @ 9am, 3pm, 9pm, 3am (Do not exceed 3600mg  of ibuprofen a day).  Take Roxicodone 5-10 for breakthrough pain every 4 hours.  Some bleeding is expected. Sitz baths to continue   Get lab work on Monday 07/23/23.  Will see next week to try to minimize ED visit need.   Future Appointments  Date Time Provider Department Center  07/25/2023  3:00 PM Lucretia Roers, MD [facility] None  11/07/2023 10:30 AM Antony Madura, MD [facility] None   Algis Greenhouse, MD [facility] [address] [place], [place] [address] [phone] (office)

## 2023-07-17 NOTE — Telephone Encounter (Signed)
Patient is scheduled with Surgeon for Post Op today (07/17/23)

## 2023-07-24 NOTE — Progress Notes (Signed)
Pt. Needs orders for surgery. 

## 2023-07-24 NOTE — Patient Instructions (Signed)
DUE TO COVID-19 ONLY TWO VISITORS  (aged 50 and older)  ARE ALLOWED TO COME WITH YOU AND STAY IN THE WAITING ROOM ONLY DURING PRE OP AND PROCEDURE.   **NO VISITORS ARE ALLOWED IN THE SHORT STAY AREA OR RECOVERY ROOM!!**  IF YOU WILL BE ADMITTED INTO THE HOSPITAL YOU ARE ALLOWED ONLY FOUR SUPPORT PEOPLE DURING VISITATION HOURS ONLY (7 AM -8PM)   The support person(s) must pass our screening, gel in and out, and wear a mask at all times, including in the patient's room. Patients must also wear a mask when staff or their support person are in the room. Visitors GUEST BADGE MUST BE WORN VISIBLY  One adult visitor may remain with you overnight and MUST be in the room by 8 P.M.     Your procedure is scheduled on: 08/06/23   Report to [facility] Main Entrance    Report to admitting at : 10:30 AM   Call this number if you have problems the morning of surgery [phone]   Do not eat solid food :After 6:00 PM the day before surgery.   Clear liquids from 6:00 PM the day before surgery,until : 9:30 AM DAY OF SURGERY  Water Black Coffee (sugar ok, NO MILK/CREAM OR CREAMERS)  Tea (sugar ok, NO MILK/CREAM OR CREAMERS) regular and decaf                             Plain Jell-O (NO RED)                                           Fruit ices (not with fruit pulp, NO RED)                                     Popsicles (NO RED)                                                                  Juice: apple, WHITE grape, WHITE cranberry Sports drinks like Gatorade (NO RED)   The day of surgery:  Drink ONE (1) Pre-Surgery Clear G2 at : 9:30 AM the morning of surgery. Drink in one sitting. Do not sip.  This drink was given to you during your hospital  pre-op appointment visit. Nothing else to drink after completing the  Pre-Surgery Clear Ensure or G2.          If you have questions, please contact your surgeon's office.  FOLLOW ANY ADDITIONAL PRE OP INSTRUCTIONS YOU RECEIVED FROM YOUR  SURGEON'S OFFICE!!!   MORNING OF SURGERY DRINK:   DRINK 1 G2 drink BEFORE YOU LEAVE HOME, DRINK ALL OF THE  G2 DRINK AT ONE TIME.   NO SOLID FOOD AFTER 600 PM THE NIGHT BEFORE YOUR SURGERY. YOU MAY DRINK CLEAR FLUIDS. THE G2 DRINK YOU DRINK BEFORE YOU LEAVE HOME WILL BE THE LAST FLUIDS YOU DRINK BEFORE SURGERY.  PAIN IS EXPECTED AFTER SURGERY AND WILL NOT BE COMPLETELY ELIMINATED. AMBULATION AND TYLENOL WILL HELP REDUCE INCISIONAL AND GAS PAIN. MOVEMENT IS KEY!  YOU ARE EXPECTED TO BE OUT OF BED WITHIN 4 HOURS OF ADMISSION TO YOUR PATIENT ROOM.  SITTING IN THE RECLINER THROUGHOUT THE DAY IS IMPORTANT FOR DRINKING FLUIDS AND MOVING GAS THROUGHOUT THE GI TRACT.  COMPRESSION STOCKINGS SHOULD BE WORN [facility] STAY UNLESS YOU ARE WALKING.   INCENTIVE SPIROMETER SHOULD BE USED EVERY HOUR WHILE AWAKE TO DECREASE POST-OPERATIVE COMPLICATIONS SUCH AS PNEUMONIA.  WHEN DISCHARGED HOME, IT IS IMPORTANT TO CONTINUE TO WALK EVERY HOUR AND USE THE INCENTIVE SPIROMETER EVERY HOUR.   Oral Hygiene is also important to reduce your risk of infection.                                    Remember - BRUSH YOUR TEETH THE MORNING OF SURGERY WITH YOUR REGULAR TOOTHPASTE  DENTURES WILL BE REMOVED PRIOR TO SURGERY PLEASE DO NOT APPLY "Poly grip" OR ADHESIVES!!!   Do NOT smoke after Midnight   Take these medicines the morning of surgery with A SIP OF WATER: metoprolol,amlodipine,linadotide.Use inhalers as usual. Clonazepam,nurtec as needed. DO NOT TAKE ANY ORAL DIABETIC MEDICATIONS DAY OF YOUR SURGERY Bring CPAP mask and tubing day of surgery.                              You may not have any metal on your body including hair pins, jewelry, and body piercing             Do not wear make-up, lotions, powders, perfumes/cologne, or deodorant  Do not wear nail polish including gel and S&S, artificial/acrylic nails, or any other type of covering on natural nails including finger and toenails. If you  have artificial nails, gel coating, etc. that needs to be removed by a nail salon please have this removed prior to surgery or surgery may need to be canceled/ delayed if the surgeon/ anesthesia feels like they are unable to be safely monitored.   Do not shave  48 hours prior to surgery.    Do not bring valuables to the hospital. [facility] IS NOT             RESPONSIBLE   FOR VALUABLES.   Contacts, glasses, or bridgework may not be worn into surgery.   Bring small overnight bag day of surgery.   DO NOT BRING YOUR HOME MEDICATIONS TO THE HOSPITAL. PHARMACY WILL DISPENSE MEDICATIONS LISTED ON YOUR MEDICATION LIST TO YOU DURING YOUR ADMISSION IN THE HOSPITAL!    Patients discharged on the day of surgery will not be allowed to drive home.  Someone NEEDS to stay with you for the first 24 hours after anesthesia.   Special Instructions: Bring a copy of your healthcare power of attorney and living will documents         the day of surgery if you haven't scanned them before.              Please read over the following fact sheets you were given: IF YOU HAVE QUESTIONS ABOUT YOUR PRE-OP INSTRUCTIONS PLEASE CALL [phone]    [facility] - Preparing for Surgery Before surgery, you can play an important role.  Because skin is not sterile, your skin needs to be as free of germs as possible.  You can reduce the number of germs on your skin by washing with CHG (chlorahexidine gluconate) soap before surgery.  CHG is an antiseptic  cleaner which kills germs and bonds with the skin to continue killing germs even after washing. Please DO NOT use if you have an allergy to CHG or antibacterial soaps.  If your skin becomes reddened/irritated stop using the CHG and inform your nurse when you arrive at Short Stay. Do not shave (including legs and underarms) for at least 48 hours prior to the first CHG shower.  You may shave your face/neck. Please follow these instructions carefully:  1.  Shower with CHG Soap the  night before surgery and the  morning of Surgery.  2.  If you choose to wash your hair, wash your hair first as usual with your  normal  shampoo.  3.  After you shampoo, rinse your hair and body thoroughly to remove the  shampoo.                           4.  Use CHG as you would any other liquid soap.  You can apply chg directly  to the skin and wash                       Gently with a scrungie or clean washcloth.  5.  Apply the CHG Soap to your body ONLY FROM THE NECK DOWN.   Do not use on face/ open                           Wound or open sores. Avoid contact with eyes, ears mouth and genitals (private parts).                       Wash face,  Genitals (private parts) with your normal soap.             6.  Wash thoroughly, paying special attention to the area where your surgery  will be performed.  7.  Thoroughly rinse your body with warm water from the neck down.  8.  DO NOT shower/wash with your normal soap after using and rinsing off  the CHG Soap.                9.  Pat yourself dry with a clean towel.            10.  Wear clean pajamas.            11.  Place clean sheets on your bed the night of your first shower and do not  sleep with pets. Day of Surgery : Do not apply any lotions/deodorants the morning of surgery.  Please wear clean clothes to the hospital/surgery center.  FAILURE TO FOLLOW THESE INSTRUCTIONS MAY RESULT IN THE CANCELLATION OF YOUR SURGERY PATIENT SIGNATURE_________________________________  NURSE SIGNATURE__________________________________  ________________________________________________________________________

## 2023-07-24 NOTE — Progress Notes (Signed)
H&H look good, we will see her tomorrow.

## 2023-07-25 ENCOUNTER — Encounter: Payer: Managed Care, Other (non HMO) | Admitting: General Surgery

## 2023-07-26 ENCOUNTER — Encounter (HOSPITAL_COMMUNITY)
Admission: RE | Admit: 2023-07-26 | Discharge: 2023-07-26 | Disposition: A | Discharge status: Home / Self Care | Payer: Managed Care, Other (non HMO) | Source: Ambulatory Visit | Attending: Anesthesiology | Admitting: Anesthesiology

## 2023-07-26 DIAGNOSIS — E119 Type 2 diabetes mellitus without complications: Secondary | ICD-10-CM

## 2023-07-26 NOTE — Progress Notes (Signed)
Pt. Did not show up for her PST appointment today at 8:00 AM. RN attempted to call the pt.,unable to reach her over the phone.Message were left with the phone number to call back for reschedule her appointment.

## 2023-07-31 ENCOUNTER — Encounter: Payer: Self-pay | Admitting: General Surgery

## 2023-07-31 ENCOUNTER — Ambulatory Visit (INDEPENDENT_AMBULATORY_CARE_PROVIDER_SITE_OTHER): Payer: Managed Care, Other (non HMO) | Admitting: General Surgery

## 2023-07-31 VITALS — BP 217/136 | HR 76 | Temp 98.1°F | Resp 16 | Ht 70.0 in | Wt 282.0 lb

## 2023-07-31 DIAGNOSIS — K648 Other hemorrhoids: Secondary | ICD-10-CM

## 2023-07-31 MED ORDER — OXYCODONE HCL 5 MG PO TABS: 5.0000 mg | ORAL_TABLET | ORAL | 0 refills | Status: DC | PRN

## 2023-07-31 NOTE — Progress Notes (Unsigned)
[facility]  Future Appointments  Date Time Provider Department Center  08/02/2023 10:30 AM Allwardt, Milus Mallick [facility] PEC  08/23/2023  1:15 PM Lucretia Roers, MD [facility] None  11/07/2023 10:30 AM Antony Madura, MD [facility] None

## 2023-07-31 NOTE — Patient Instructions (Addendum)
Continue sitz baths. Continue Tylenol and ibuprofen. FMLA being done from surgery date to 09/03/2023.   Keep stools regular and soft.

## 2023-08-01 ENCOUNTER — Telehealth: Payer: Self-pay | Admitting: Family Medicine

## 2023-08-01 NOTE — Telephone Encounter (Signed)
Patient in office on 07/31/2023 requesting FMLA paperwork to be completed and sent to her work.  Paperwork completed and contacted Missy Sabins in regards of how to send it and she request email. Completed FMLA paperwork emailed to [email or web address] and received delivery confirmation.   Out of work 07/04/2023 and may return with no restrictions on 09/03/2023.  Work Information: [employer] Missy Sabins  HR Admin P - [phone] ext: [phone]

## 2023-08-02 ENCOUNTER — Encounter: Payer: Self-pay | Admitting: Physician Assistant

## 2023-08-02 ENCOUNTER — Ambulatory Visit: Payer: Managed Care, Other (non HMO) | Admitting: Physician Assistant

## 2023-08-02 VITALS — BP 142/84 | HR 86 | Temp 97.8°F | Wt 282.8 lb

## 2023-08-02 DIAGNOSIS — G43001 Migraine without aura, not intractable, with status migrainosus: Secondary | ICD-10-CM

## 2023-08-02 DIAGNOSIS — F909 Attention-deficit hyperactivity disorder, unspecified type: Secondary | ICD-10-CM | POA: Diagnosis not present

## 2023-08-02 DIAGNOSIS — K648 Other hemorrhoids: Secondary | ICD-10-CM

## 2023-08-02 MED ORDER — KETOROLAC TROMETHAMINE 60 MG/2ML IM SOLN
60.0000 mg | Freq: Once | INTRAMUSCULAR | Status: AC
Start: 2023-08-02 — End: 2023-08-02
  Administered 2023-08-02: 60 mg via INTRAMUSCULAR
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
## 2023-08-03 ENCOUNTER — Encounter: Payer: Self-pay | Admitting: Family Medicine

## 2023-08-05 NOTE — Assessment & Plan Note (Signed)
Following with Dr. Henreitta Leber

## 2023-08-05 NOTE — Assessment & Plan Note (Signed)
Acute migraine today - typical for her - weather related Toradol 60 mg IM given today Continue usual regimen at home  ER precautions discussed

## 2023-08-05 NOTE — Assessment & Plan Note (Signed)
PDMP reviewed today, no red flags, filling appropriately.  Patient is stable with Adderall XR 30 mg in the mornings, occasional use of Adderall IR 15 mg at 2 PM. No side effects or concerns with the medicine; hasn't been taking lately due to hemorrhoid issues

## 2023-08-09 ENCOUNTER — Encounter: Payer: Self-pay | Admitting: Gastroenterology

## 2023-08-10 MED ORDER — KETOROLAC TROMETHAMINE 60 MG/2ML IM SOLN
60.0000 mg | Freq: Once | INTRAMUSCULAR | Status: AC
Start: 2023-08-10 — End: 2023-08-02
  Administered 2023-08-02: 60 mg via INTRAMUSCULAR

## 2023-08-10 NOTE — Addendum Note (Signed)
Addended byZoe Lan on: 08/10/2023 12:55 PM   Modules accepted: Orders

## 2023-08-20 ENCOUNTER — Other Ambulatory Visit: Payer: Self-pay | Admitting: Physician Assistant

## 2023-08-20 DIAGNOSIS — F410 Panic disorder [episodic paroxysmal anxiety] without agoraphobia: Secondary | ICD-10-CM

## 2023-08-23 ENCOUNTER — Ambulatory Visit: Payer: Managed Care, Other (non HMO) | Admitting: General Surgery

## 2023-08-23 ENCOUNTER — Other Ambulatory Visit: Payer: Self-pay

## 2023-08-23 ENCOUNTER — Encounter: Payer: Self-pay | Admitting: General Surgery

## 2023-08-23 VITALS — BP 170/111 | HR 82 | Temp 98.0°F | Resp 16 | Ht 70.0 in | Wt 286.0 lb

## 2023-08-23 DIAGNOSIS — K648 Other hemorrhoids: Secondary | ICD-10-CM

## 2023-08-23 NOTE — Patient Instructions (Signed)
Continue keeping stools soft and continue ointments/ sitz baths as needed. Can still have some bleeding due to the hemorrhoid tissue remaining.

## 2023-08-23 NOTE — Progress Notes (Signed)
[facility]  Doing better but still with some bleeding and clots with Bms. Has some stingy pain with Bms. Says the first part of Bm is hard despite her using fiber/ colace, linzess etc. She is doing better though each day.  BP (!) 170/111 (BP Location: Right Arm, Patient Position: Sitting, Cuff Size: Normal)   Pulse 82   Temp 98 F (36.7 C) (Oral)   Resp 16   Ht 5\' 10"  (1.778 m)   Wt 286 lb (129.7 kg)   SpO2 96%   BMI 41.04 kg/m  Anal region healing, small tags X2 present, raw tissue at anal verge  Overall healing no signs of infection  Patient s/p extensive hemorrhoidectomy for large thrombosed hemorrhoids.  I took all of the tissue I could safely. She says she is better now than before just not 100%.   She is planning to go back to work 9/9 and hopes to have gastric bypass 9/24.  Continue keeping stools soft and continue ointments/ sitz baths as needed. Can still have some bleeding due to the hemorrhoid tissue remaining.   May ultimately need more tissue removed if continues to have more bleeding or issues but would wait until this is completely healed- at least 4 months out before pursuing.   Future Appointments  Date Time Provider Department Center  09/05/2023  2:00 PM Lucretia Roers, MD [facility] None  10/02/2023  3:15 PM [facility] POST-OP CLASS [facility] NDM  11/07/2023 10:30 AM Antony Madura, MD [facility] None  12/17/2023  8:00 AM Beulah Gandy, RD [facility] NDM   Algis Greenhouse, MD [facility] [address] [place], [place] [address] [phone] (office)

## 2023-09-03 NOTE — Progress Notes (Signed)
Sent message, via epic in basket, requesting orders in epic from surgeon.  

## 2023-09-03 NOTE — Patient Instructions (Signed)
SURGICAL WAITING ROOM VISITATION Patients having surgery or a procedure may have no more than 2 support people in the waiting area - these visitors may rotate.    Children under the age of 6 must have an adult with them who is not the patient.  If the patient needs to stay at the hospital during part of their recovery, the visitor guidelines for inpatient rooms apply. Pre-op nurse will coordinate an appropriate time for 1 support person to accompany patient in pre-op.  This support person may not rotate.    Please refer to the [facility] website for the visitor guidelines for Inpatients (after your surgery is over and you are in a regular room).       Your procedure is scheduled on: 09-18-23   Report to [facility] Main Entrance    Report to admitting at 8:45 AM   Call this number if you have problems the morning of surgery [phone]   Do not eat food :After 6:00 PM the night before surgery    After Midnight you may have the following liquids until 8:00 AM DAY OF SURGERY  Water Non-Citrus Juices (without pulp, NO RED-Apple, White grape, White cranberry) Black Coffee (NO MILK/CREAM OR CREAMERS, sugar ok)  Clear Tea (NO MILK/CREAM OR CREAMERS, sugar ok) regular and decaf                             Plain Jell-O (NO RED)                                           Fruit ices (not with fruit pulp, NO RED)                                     Popsicles (NO RED)                                                               Sports drinks like Gatorade (NO RED)                   The day of surgery:  Drink ONE (1) Pre-Surgery G2 at 8:00 AM the morning of surgery. Drink in one sitting. Do not sip.  This drink was given to you during your hospital  pre-op appointment visit. Nothing else to drink after completing the Pre-Surgery G2.          If you have questions, please contact your surgeon's office.   FOLLOW BOWEL PREP AND ANY ADDITIONAL PRE OP INSTRUCTIONS YOU RECEIVED  FROM YOUR SURGEON'S OFFICE!!!     Oral Hygiene is also important to reduce your risk of infection.                                    Remember - BRUSH YOUR TEETH THE MORNING OF SURGERY WITH YOUR REGULAR TOOTHPASTE   Do NOT smoke after Midnight   Take these medicines the morning of surgery with A SIP OF WATER:  Amlodipine  Metoprolol  Okay to use inhalers  If needed may take Clonazepam, Nurtec and Promethazine  Stop all vitamins and herbal supplements 7 days before surgery  Bring CPAP mask and tubing day of surgery.                              You may not have any metal on your body including hair pins, jewelry, and body piercing             Do not wear make-up, lotions, powders, perfumes or deodorant  Do not wear nail polish including gel and S&S, artificial/acrylic nails, or any other type of covering on natural nails including finger and toenails. If you have artificial nails, gel coating, etc. that needs to be removed by a nail salon please have this removed prior to surgery or surgery may need to be canceled/ delayed if the surgeon/ anesthesia feels like they are unable to be safely monitored.   Do not shave  48 hours prior to surgery.    Do not bring valuables to the hospital. [facility] IS NOT RESPONSIBLE   FOR VALUABLES.   Contacts, dentures or bridgework may not be worn into surgery.   Bring small overnight bag day of surgery.   DO NOT BRING YOUR HOME MEDICATIONS TO THE HOSPITAL. PHARMACY WILL DISPENSE MEDICATIONS LISTED ON YOUR MEDICATION LIST TO YOU DURING YOUR ADMISSION IN THE HOSPITAL!   Special Instructions: Bring a copy of your healthcare power of attorney and living will documents the day of surgery if you haven't scanned them before.              Please read over the following fact sheets you were given: IF YOU HAVE QUESTIONS ABOUT YOUR PRE-OP INSTRUCTIONS PLEASE CALL [phone] Gwen  If you received a COVID test during your pre-op visit  it is requested  that you wear a mask when out in public, stay away from anyone that may not be feeling well and notify your surgeon if you develop symptoms. If you test positive for Covid or have been in contact with anyone that has tested positive in the last 10 days please notify you surgeon.  [facility] - Preparing for Surgery Before surgery, you can play an important role.  Because skin is not sterile, your skin needs to be as free of germs as possible.  You can reduce the number of germs on your skin by washing with CHG (chlorahexidine gluconate) soap before surgery.  CHG is an antiseptic cleaner which kills germs and bonds with the skin to continue killing germs even after washing. Please DO NOT use if you have an allergy to CHG or antibacterial soaps.  If your skin becomes reddened/irritated stop using the CHG and inform your nurse when you arrive at Short Stay. Do not shave (including legs and underarms) for at least 48 hours prior to the first CHG shower.  You may shave your face/neck.  Please follow these instructions carefully:  1.  Shower with CHG Soap the night before surgery and the  morning of surgery.  2.  If you choose to wash your hair, wash your hair first as usual with your normal  shampoo.  3.  After you shampoo, rinse your hair and body thoroughly to remove the shampoo.  4.  Use CHG as you would any other liquid soap.  You can apply chg directly to the skin and wash.  Gently with a scrungie or clean washcloth.  5.  Apply the CHG Soap to your body ONLY FROM THE NECK DOWN.   Do   not use on face/ open                           Wound or open sores. Avoid contact with eyes, ears mouth and   genitals (private parts).                       Wash face,  Genitals (private parts) with your normal soap.             6.  Wash thoroughly, paying special attention to the area where your    surgery  will be performed.  7.  Thoroughly rinse your body with warm water from the neck  down.  8.  DO NOT shower/wash with your normal soap after using and rinsing off the CHG Soap.                9.  Pat yourself dry with a clean towel.            10.  Wear clean pajamas.            11.  Place clean sheets on your bed the night of your first shower and do not  sleep with pets. Day of Surgery : Do not apply any lotions/deodorants the morning of surgery.  Please wear clean clothes to the hospital/surgery center.  FAILURE TO FOLLOW THESE INSTRUCTIONS MAY RESULT IN THE CANCELLATION OF YOUR SURGERY  PATIENT SIGNATURE_________________________________  NURSE SIGNATURE__________________________________  ________________________________________________________________________

## 2023-09-03 NOTE — Progress Notes (Signed)
COVID Vaccine Completed:  Yes  Date of COVID positive in last 90 days:  PCP - Ila Mcgill, PA-C Cardiologist - Jodelle Red, MD  Cardiac clearance in Epic dated by Dr. Cristal Deer  Chest x-ray - 03-22-23 Epic EKG - 04-10-23 Epic Stress Test -  ECHO - 05-07-23 Epic Cardiac Cath -  Pacemaker/ICD device last checked: Spinal Cord Stimulator:  Bowel Prep -   Sleep Study -  CPAP -   Fasting Blood Sugar -  Checks Blood Sugar _____ times a day  Last dose of GLP1 agonist-  N/A GLP1 instructions:  N/A   Last dose of SGLT-2 inhibitors-  N/A SGLT-2 instructions: N/A   Blood Thinner Instructions:  Time Aspirin Instructions: Last Dose:  Activity level:  Can go up a flight of stairs and perform activities of daily living without stopping and without symptoms of chest pain or shortness of breath.  Able to exercise without symptoms  Unable to go up a flight of stairs without symptoms of     Anesthesia review: LVH, abnormal EKG, HTN, DM  Patient denies shortness of breath, fever, cough and chest pain at PAT appointment  Patient verbalized understanding of instructions that were given to them at the PAT appointment. Patient was also instructed that they will need to review over the PAT instructions again at home before surgery.

## 2023-09-04 ENCOUNTER — Telehealth: Payer: Self-pay | Admitting: Skilled Nursing Facility1

## 2023-09-04 ENCOUNTER — Ambulatory Visit: Payer: Self-pay | Admitting: General Surgery

## 2023-09-04 DIAGNOSIS — D508 Other iron deficiency anemias: Secondary | ICD-10-CM

## 2023-09-04 NOTE — Telephone Encounter (Signed)
Dietitian called pt to assess their understanding of the pre-op nutrition recommendations through the teach back method to ensure the pts knowledge readiness in preparation for surgery.    Dietitian engaged in a comprehensive review of the pre-op and post op nutrition education as well as constipation protocol.  Pt will pick up copy of pre-op handout tomorrow.

## 2023-09-04 NOTE — Progress Notes (Signed)
COVID Vaccine received:  []  No []  Yes Date of any COVID positive Test in last 90 days:  PCP - Alyssa Allwardt PA-C Cardiologist - Jodelle Red MD  Chest x-ray - 03/22/23 Epic EKG -  04/10/23 Epic Stress Test -  ECHO - 05/07/23 Epic Cardiac Cath -   Bowel Prep - []  No  []   Yes ______  Pacemaker / ICD device []  No []  Yes   Spinal Cord Stimulator:[]  No []  Yes       History of Sleep Apnea? []  No []  Yes   CPAP used?- []  No []  Yes    Does the patient monitor blood sugar?          []  No []  Yes  []  N/A  Patient has: []  NO Hx DM   []  Pre-DM                 []  DM1  []   DM2 Does patient have a [employer] or [employer]? []  No []  Yes   Fasting Blood Sugar Ranges-  Checks Blood Sugar _____ times a day  GLP1 agonist / usual dose -  GLP1 instructions:  SGLT-2 inhibitors / usual dose -  SGLT-2 instructions:   Blood Thinner / Instructions: Aspirin Instructions:  Comments:   Activity level: Patient is able / unable to climb a flight of stairs without difficulty; []  No CP  []  No SOB, but would have ___   Patient can / can not perform ADLs without assistance.   Anesthesia review:   Patient denies shortness of breath, fever, cough and chest pain at PAT appointment.  Patient verbalized understanding and agreement to the Pre-Surgical Instructions that were given to them at this PAT appointment. Patient was also educated of the need to review these PAT instructions again prior to his/her surgery.I reviewed the appropriate phone numbers to call if they have any and questions or concerns.

## 2023-09-04 NOTE — Progress Notes (Signed)
Please send preop orders for PST appointment 09/05/23

## 2023-09-05 ENCOUNTER — Encounter (HOSPITAL_COMMUNITY)
Admission: RE | Admit: 2023-09-05 | Discharge: 2023-09-05 | Disposition: A | Discharge status: Home / Self Care | Payer: Managed Care, Other (non HMO) | Source: Ambulatory Visit | Attending: General Surgery

## 2023-09-05 ENCOUNTER — Ambulatory Visit: Payer: Managed Care, Other (non HMO) | Admitting: General Surgery

## 2023-09-05 ENCOUNTER — Encounter (HOSPITAL_COMMUNITY): Payer: Self-pay

## 2023-09-05 ENCOUNTER — Other Ambulatory Visit: Payer: Self-pay

## 2023-09-05 VITALS — BP 147/96 | HR 81 | Temp 98.5°F | Resp 16 | Ht 71.0 in | Wt 285.6 lb

## 2023-09-05 DIAGNOSIS — D508 Other iron deficiency anemias: Secondary | ICD-10-CM | POA: Diagnosis not present

## 2023-09-05 DIAGNOSIS — Z01812 Encounter for preprocedural laboratory examination: Secondary | ICD-10-CM | POA: Insufficient documentation

## 2023-09-05 DIAGNOSIS — Z01818 Encounter for other preprocedural examination: Secondary | ICD-10-CM | POA: Diagnosis present

## 2023-09-05 DIAGNOSIS — E119 Type 2 diabetes mellitus without complications: Secondary | ICD-10-CM | POA: Insufficient documentation

## 2023-09-05 LAB — CBC WITH DIFFERENTIAL/PLATELET
Abs Immature Granulocytes: 0.02 10*3/uL (ref 0.00–0.07)
Basophils Absolute: 0 10*3/uL (ref 0.0–0.1)
Basophils Relative: 1 %
Eosinophils Absolute: 0.3 10*3/uL (ref 0.0–0.5)
Eosinophils Relative: 4 %
HCT: 43.1 % (ref 36.0–46.0)
Hemoglobin: 13.6 g/dL (ref 12.0–15.0)
Immature Granulocytes: 0 %
Lymphocytes Relative: 22 %
Lymphs Abs: 1.4 10*3/uL (ref 0.7–4.0)
MCH: 27.8 pg (ref 26.0–34.0)
MCHC: 31.6 g/dL (ref 30.0–36.0)
MCV: 88 fL (ref 80.0–100.0)
Monocytes Absolute: 0.4 10*3/uL (ref 0.1–1.0)
Monocytes Relative: 7 %
Neutro Abs: 4.1 10*3/uL (ref 1.7–7.7)
Neutrophils Relative %: 66 %
Platelets: 189 10*3/uL (ref 150–400)
RBC: 4.9 MIL/uL (ref 3.87–5.11)
RDW: 13.9 % (ref 11.5–15.5)
WBC: 6.2 10*3/uL (ref 4.0–10.5)
nRBC: 0 % (ref 0.0–0.2)

## 2023-09-05 LAB — COMPREHENSIVE METABOLIC PANEL
ALT: 18 U/L (ref 0–44)
AST: 16 U/L (ref 15–41)
Albumin: 3.7 g/dL (ref 3.5–5.0)
Alkaline Phosphatase: 59 U/L (ref 38–126)
Anion gap: 8 (ref 5–15)
BUN: 11 mg/dL (ref 6–20)
CO2: 24 mmol/L (ref 22–32)
Calcium: 8.6 mg/dL — ABNORMAL LOW (ref 8.9–10.3)
Chloride: 102 mmol/L (ref 98–111)
Creatinine, Ser: 0.74 mg/dL (ref 0.44–1.00)
GFR, Estimated: >60 mL/min (ref >60)
Glucose, Bld: 132 mg/dL — ABNORMAL HIGH (ref 70–99)
Potassium: 3.2 mmol/L — ABNORMAL LOW (ref 3.5–5.1)
Sodium: 134 mmol/L — ABNORMAL LOW (ref 135–145)
Total Bilirubin: 0.6 mg/dL (ref 0.3–1.2)
Total Protein: 7.1 g/dL (ref 6.5–8.1)

## 2023-09-05 LAB — TYPE AND SCREEN
ABO/RH(D): A POS
Antibody Screen: NEGATIVE

## 2023-09-05 LAB — GLUCOSE, CAPILLARY: Glucose-Capillary: 146 mg/dL — ABNORMAL HIGH (ref 70–99)

## 2023-09-06 LAB — HEMOGLOBIN A1C
Hgb A1c MFr Bld: 7.3 % — ABNORMAL HIGH (ref 4.8–5.6)
Mean Plasma Glucose: 163 mg/dL

## 2023-09-11 ENCOUNTER — Encounter (HOSPITAL_COMMUNITY): Payer: Self-pay | Admitting: Medical

## 2023-09-11 NOTE — Anesthesia Preprocedure Evaluation (Signed)
Anesthesia Evaluation    Airway        Dental   Pulmonary           Cardiovascular hypertension,      Neuro/Psych    GI/Hepatic   Endo/Other  diabetes    Renal/GU      Musculoskeletal   Abdominal   Peds  Hematology   Anesthesia Other Findings   Reproductive/Obstetrics                              Anesthesia Physical Anesthesia Plan  ASA:   Anesthesia Plan:    Post-op Pain Management:    Induction:   PONV Risk Score and Plan:   Airway Management Planned:   Additional Equipment:   Intra-op Plan:   Post-operative Plan:   Informed Consent:   Plan Discussed with:   Anesthesia Plan Comments: (See PAT note from 9/11 by Sherlie Ban PA-C )         Anesthesia Quick Evaluation

## 2023-09-11 NOTE — Progress Notes (Signed)
Choose an anesthesia record to view details        DISCUSSION: Shelly Larson is a 52 yo female who presents to PAT prior to LAPAROSCOPIC ROUX-EN-Y GASTRIC BYPASS WITH UPPER ENDOSCOPY on 09/18/23 with Dr. Andrey Campanile. PMH significant for HTN, asthma, DM, GERD, migraines, obesity, panic attacks, hx of colon cancer s/p colectomy (2016), recent hemorrhoid surgery (07/04/23).   Patient saw Cardiology on 04/10/23 for pre-op exam. Echo was ordered due to hx of HTN and LVH. 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 EF was normal and there was no significant valvular dysfunction. Per Dr. Cristal Deer: "The walls of the heart have gotten thicker since the last ultrasound. It's not urgent/should not keep you from surgery, but it means we need to keep a close eye on blood pressure and stress on the heart. Ideally, weight loss from surgery should help with this. We can repeat in the future after you have surgery to make sure it's stable or improving."  Patient had a recent extensive hemorrhoidectomy with Dr. Henreitta Leber on 07/04/23. She has had slow recovery from this due to pain and recurrent rectal bleeding. Has had 2 ED visit and close follow up with her surgeon. CBC normal on 09/05/23.    VS: BP (!) 147/96   Pulse 81   Temp 36.9 C (Oral)   Resp 16   Ht 5\' 11"  (1.803 m)   Wt 129.5 kg   SpO2 97%   BMI 39.83 kg/m   PROVIDERS: Allwardt, Crist Infante, PA-C Cardiology: Jodelle Red, MD   LABS: Labs reviewed: Acceptable for surgery. (all labs ordered are listed, but only abnormal results are displayed)  Labs Reviewed  HEMOGLOBIN A1C - Abnormal; Notable for the following components:      Result Value   Hgb A1c MFr Bld 7.3 (*)    All other components within normal limits  COMPREHENSIVE METABOLIC PANEL - Abnormal; Notable for the following components:   Sodium 134 (*)    Potassium 3.2 (*)    Glucose, Bld 132 (*)    Calcium 8.6 (*)    All other components within normal limits   GLUCOSE, CAPILLARY - Abnormal; Notable for the following components:   Glucose-Capillary 146 (*)    All other components within normal limits  CBC WITH DIFFERENTIAL/PLATELET  TYPE AND SCREEN     IMAGES:  CTA abdomen GIB protocol 07/16/23: IMPRESSION: 1. No acute findings within the abdomen or pelvis. Specifically, no discrete areas of intraluminal contrast extravasation to suggest GI bleeding. 2. Post colonic anastomosis at the level of the splenic flexure of the colon. 3. Moderate-to-large colonic stool burden without evidence of enteric obstruction. 4. Cardiomegaly.   EKG:   CV:  Echo 05/07/23:  IMPRESSIONS     1. 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 Posterior wall up to  14 mm. No signs of LVOT obstruction. Would consider CMR for better  characterization if clinically inidicated. Left ventricular ejection  fraction, by estimation, is 60 to 65%. The  left ventricle has normal function. The left ventricle has no regional  wall motion abnormalities. 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 Left ventricular diastolic parameters  are consistent with Grade I diastolic  dysfunction (impaired relaxation).   2. Right ventricular systolic function is normal. The right ventricular  size is normal. Tricuspid regurgitation signal is inadequate for assessing  PA pressure.   3. The mitral valve is grossly normal. Trivial mitral valve  regurgitation.  No evidence of mitral stenosis.   4. The aortic valve is grossly normal. Aortic valve regurgitation is  trivial. No aortic stenosis is present.   5. There is mild dilatation of the ascending aorta, measuring 40 mm.   6. The inferior vena cava is normal in size with greater than 50%  respiratory variability, suggesting right atrial pressure of 3 mmHg.   Past Medical History:  Diagnosis Date   ADHD    Allergies    Anemia    Arthritis    Asthma    Biliary acute pancreatitis  2010   Colon cancer (HCC) 2016   Transverse colon    Diabetes mellitus without complication (HCC)    Dyspnea    GERD (gastroesophageal reflux disease)    Hypertension    Hypertension    Migraine     Past Surgical History:  Procedure Laterality Date   ABLATION     BAND HEMORRHOIDECTOMY     BIOPSY  09/02/2021   Procedure: BIOPSY;  Surgeon: Lanelle Bal, DO;  [facility];  Service: Endoscopy;;   BREAST REDUCTION SURGERY Bilateral    CHOLECYSTECTOMY  2010   [place], biliary pancreatitis   COLECTOMY  09/30/2015   distal transverse d/t adenocarcinoma   COLONOSCOPY N/A 06/11/2018   Procedure: COLONOSCOPY;  Surgeon: Corbin Ade, MD;  Location: [facility];  Service: Endoscopy;  Laterality: N/A;  1:45pm   COLONOSCOPY WITH PROPOFOL N/A 04/02/2023   Procedure: COLONOSCOPY WITH PROPOFOL;  Surgeon: Corbin Ade, MD;  [facility];  Service: Endoscopy;  Laterality: N/A;  12:15 pm   DILITATION & CURRETTAGE/HYSTROSCOPY WITH NOVASURE ABLATION N/A 09/10/2018   Procedure: DILATATION & CURETTAGE/HYSTEROSCOPY WITH MINERVA  ENDOMETRIAL ABLATION;  Surgeon: Tilda Burrow, MD;  Location: [facility];  Service: Gynecology;  Laterality: N/A;   ESOPHAGOGASTRODUODENOSCOPY (EGD) WITH PROPOFOL N/A 09/02/2021   Procedure: ESOPHAGOGASTRODUODENOSCOPY (EGD) WITH PROPOFOL;  Surgeon: Lanelle Bal, DO;  [facility];  Service: Endoscopy;  Laterality: N/A;   HEMORRHOID SURGERY N/A 07/04/2023   Procedure: EXTENSIVE HEMORRHOIDECTOMY;  Surgeon: Lucretia Roers, MD;  Location: [facility];  Service: General;  Laterality: N/A;   POLYPECTOMY  06/11/2018   Procedure: POLYPECTOMY;  Surgeon: Corbin Ade, MD;  [facility];  Service: Endoscopy;;  colon   POLYPECTOMY  04/02/2023   Procedure: POLYPECTOMY;  Surgeon: Corbin Ade, MD;  [facility];  Service: Endoscopy;;    MEDICATIONS:  albuterol (VENTOLIN HFA) 108 (90 Base) MCG/ACT inhaler    amitriptyline (ELAVIL) 25 MG tablet   amLODipine (NORVASC) 10 MG tablet   amphetamine-dextroamphetamine (ADDERALL XR) 30 MG 24 hr capsule   amphetamine-dextroamphetamine (ADDERALL) 15 MG tablet   Calcium Carb-Cholecalciferol (CALTRATE 600+D3 PO)   clonazePAM (KLONOPIN) 0.5 MG tablet   hydrocortisone (ANUSOL-HC) 2.5 % rectal cream   linaclotide (LINZESS) 290 MCG CAPS capsule   losartan-hydrochlorothiazide (HYZAAR) 100-25 MG tablet   metoprolol succinate (TOPROL-XL) 50 MG 24 hr tablet   omeprazole (PRILOSEC) 40 MG capsule   ondansetron (ZOFRAN) 4 MG tablet   oxyCODONE (ROXICODONE) 5 MG immediate release tablet   promethazine (PHENERGAN) 12.5 MG tablet   Rhubarb (ESTROVEN MENOPAUSE RELIEF PO)   Rimegepant Sulfate (NURTEC) 75 MG TBDP   No current facility-administered medications for this encounter.   Marcille Blanco [facility] Surgical Short Stay/Anesthesiology [facility] Phone [phone] 09/11/2023 9:49 AM

## 2023-09-18 ENCOUNTER — Inpatient Hospital Stay (HOSPITAL_COMMUNITY)
Admission: RE | Admit: 2023-09-18 | Payer: Managed Care, Other (non HMO) | Source: Home / Self Care | Admitting: General Surgery

## 2023-09-18 ENCOUNTER — Encounter (HOSPITAL_COMMUNITY): Admission: RE | Payer: Self-pay | Source: Home / Self Care

## 2023-09-18 SURGERY — LAPAROSCOPIC ROUX-EN-Y GASTRIC BYPASS WITH UPPER ENDOSCOPY
Anesthesia: General

## 2023-09-25 NOTE — Progress Notes (Signed)
Sent message, via epic in basket, requesting orders in epic from surgeon.  

## 2023-09-26 ENCOUNTER — Ambulatory Visit: Payer: Managed Care, Other (non HMO) | Admitting: General Surgery

## 2023-09-28 NOTE — Progress Notes (Signed)
Second request for pre op orders in [facility]: Spoke with Toniann Fail at [facility].

## 2023-10-01 ENCOUNTER — Ambulatory Visit: Payer: Self-pay | Admitting: General Surgery

## 2023-10-01 DIAGNOSIS — D508 Other iron deficiency anemias: Secondary | ICD-10-CM

## 2023-10-01 NOTE — Patient Instructions (Signed)
SURGICAL WAITING ROOM VISITATION  Patients having surgery or a procedure may have no more than 2 support people in the waiting area - these visitors may rotate.    Children under the age of 44 must have an adult with them who is not the patient.  Due to an increase in RSV and influenza rates and associated hospitalizations, children ages 57 and under may not visit patients in [facility] hospitals.  If the patient needs to stay at the hospital during part of their recovery, the visitor guidelines for inpatient rooms apply. Pre-op nurse will coordinate an appropriate time for 1 support person to accompany patient in pre-op.  This support person may not rotate.    Please refer to the [facility] website for the visitor guidelines for Inpatients (after your surgery is over and you are in a regular room).    Your procedure is scheduled on: 10/15/23   Report to [facility] Main Entrance    Report to admitting at 8:45 AM   Call this number if you have problems the morning of surgery [phone]   MORNING OF SURGERY DRINK:   DRINK 1 G2 drink BEFORE YOU LEAVE HOME, DRINK ALL OF THE  G2 DRINK AT ONE TIME.   NO SOLID FOOD AFTER 600 PM THE NIGHT BEFORE YOUR SURGERY. YOU MAY DRINK CLEAR FLUIDS. THE G2 DRINK YOU DRINK BEFORE YOU LEAVE HOME WILL BE THE LAST FLUIDS YOU DRINK BEFORE SURGERY.  PAIN IS EXPECTED AFTER SURGERY AND WILL NOT BE COMPLETELY ELIMINATED. AMBULATION AND TYLENOL WILL HELP REDUCE INCISIONAL AND GAS PAIN. MOVEMENT IS KEY!  YOU ARE EXPECTED TO BE OUT OF BED WITHIN 4 HOURS OF ADMISSION TO YOUR PATIENT ROOM.  SITTING IN THE RECLINER THROUGHOUT THE DAY IS IMPORTANT FOR DRINKING FLUIDS AND MOVING GAS THROUGHOUT THE GI TRACT.  COMPRESSION STOCKINGS SHOULD BE WORN [facility] STAY UNLESS YOU ARE WALKING.   INCENTIVE SPIROMETER SHOULD BE USED EVERY HOUR WHILE AWAKE TO DECREASE POST-OPERATIVE COMPLICATIONS SUCH AS PNEUMONIA.  WHEN DISCHARGED HOME, IT IS IMPORTANT TO  CONTINUE TO WALK EVERY HOUR AND USE THE INCENTIVE SPIROMETER EVERY HOUR.   You may have the following liquids until 8:00 AM DAY OF SURGERY  Water Non-Citrus Juices (without pulp, NO RED-Apple, White grape, White cranberry) Black Coffee (NO MILK/CREAM OR CREAMERS, sugar ok)  Clear Tea (NO MILK/CREAM OR CREAMERS, sugar ok) regular and decaf                             Plain Jell-O (NO RED)                                           Fruit ices (not with fruit pulp, NO RED)                                     Popsicles (NO RED)                                                               Sports drinks like Gatorade (NO RED)  The day of surgery:  Drink ONE (1) Pre-Surgery G2 at 8:00 AM the morning of surgery. Drink in one sitting. Do not sip.  This drink was given to you during your hospital  pre-op appointment visit. Nothing else to drink after completing the  Pre-Surgery G2.          If you have questions, please contact your surgeon's office.   FOLLOW BOWEL PREP AND ANY ADDITIONAL PRE OP INSTRUCTIONS YOU RECEIVED FROM YOUR SURGEON'S OFFICE!!!     Oral Hygiene is also important to reduce your risk of infection.                                    Remember - BRUSH YOUR TEETH THE MORNING OF SURGERY WITH YOUR REGULAR TOOTHPASTE  DENTURES WILL BE REMOVED PRIOR TO SURGERY PLEASE DO NOT APPLY "Poly grip" OR ADHESIVES!!!   Do NOT smoke after Midnight   Stop all vitamins and herbal supplements 7 days before surgery.   Take these medicines the morning of surgery with A SIP OF WATER: Albuterol, Amlodipine, Clonazepam, Metoprolol, Phenergan   DO NOT TAKE ANY ORAL DIABETIC MEDICATIONS DAY OF YOUR SURGERY  Bring CPAP mask and tubing day of surgery.                              You may not have any metal on your body including hair pins, jewelry, and body piercing             Do not wear make-up, lotions, powders, perfumes, or deodorant  Do not wear nail polish including gel and S&S,  artificial/acrylic nails, or any other type of covering on natural nails including finger and toenails. If you have artificial nails, gel coating, etc. that needs to be removed by a nail salon please have this removed prior to surgery or surgery may need to be canceled/ delayed if the surgeon/ anesthesia feels like they are unable to be safely monitored.   Do not shave  48 hours prior to surgery.    Do not bring valuables to the hospital. [facility] IS NOT             RESPONSIBLE   FOR VALUABLES.   Contacts, glasses, dentures or bridgework may not be worn into surgery.   Bring small overnight bag day of surgery.   DO NOT BRING YOUR HOME MEDICATIONS TO THE HOSPITAL. PHARMACY WILL DISPENSE MEDICATIONS LISTED ON YOUR MEDICATION LIST TO YOU DURING YOUR ADMISSION IN THE HOSPITAL!   Special Instructions: Bring a copy of your healthcare power of attorney and living will documents the day of surgery if you haven't scanned them before.              Please read over the following fact sheets you were given: IF YOU HAVE QUESTIONS ABOUT YOUR PRE-OP INSTRUCTIONS PLEASE CALL [phone] [place]ras   If you received a COVID test during your pre-op visit  it is requested that you wear a mask when out in public, stay away from anyone that may not be feeling well and notify your surgeon if you develop symptoms. If you test positive for Covid or have been in contact with anyone that has tested positive in the last 10 days please notify you surgeon.    [facility] - Preparing for Surgery Before surgery, you can play an important role.  Because skin is not sterile, your skin needs to be as free of germs as possible.  You can reduce the number of germs on your skin by washing with CHG (chlorahexidine gluconate) soap before surgery.  CHG is an antiseptic cleaner which kills germs and bonds with the skin to continue killing germs even after washing. Please DO NOT use if you have an allergy to CHG or antibacterial  soaps.  If your skin becomes reddened/irritated stop using the CHG and inform your nurse when you arrive at Short Stay. Do not shave (including legs and underarms) for at least 48 hours prior to the first CHG shower.  You may shave your face/neck.  Please follow these instructions carefully:  1.  Shower with CHG Soap the night before surgery and the  morning of surgery.  2.  If you choose to wash your hair, wash your hair first as usual with your normal  shampoo.  3.  After you shampoo, rinse your hair and body thoroughly to remove the shampoo.                             4.  Use CHG as you would any other liquid soap.  You can apply chg directly to the skin and wash.  Gently with a scrungie or clean washcloth.  5.  Apply the CHG Soap to your body ONLY FROM THE NECK DOWN.   Do   not use on face/ open                           Wound or open sores. Avoid contact with eyes, ears mouth and   genitals (private parts).                       Wash face,  Genitals (private parts) with your normal soap.             6.  Wash thoroughly, paying special attention to the area where your    surgery  will be performed.  7.  Thoroughly rinse your body with warm water from the neck down.  8.  DO NOT shower/wash with your normal soap after using and rinsing off the CHG Soap.                9.  Pat yourself dry with a clean towel.            10.  Wear clean pajamas.            11.  Place clean sheets on your bed the night of your first shower and do not  sleep with pets. Day of Surgery : Do not apply any lotions/deodorants the morning of surgery.  Please wear clean clothes to the hospital/surgery center.  FAILURE TO FOLLOW THESE INSTRUCTIONS MAY RESULT IN THE CANCELLATION OF YOUR SURGERY  PATIENT SIGNATURE_________________________________  NURSE SIGNATURE__________________________________  ________________________________________________________________________

## 2023-10-01 NOTE — Progress Notes (Signed)
COVID Vaccine Completed: yes  Date of COVID positive in last 90 days:  PCP - Ila Mcgill, MD Cardiologist - Jodelle Red, MD LOV 04/10/23   Cardiac clearance by Jodelle Red, MD 04/10/23 in Epic  Chest x-ray - 03/24/23 Epic EKG - 04/10/23 Epic Stress Test -  ECHO - 05/07/23 Epic Cardiac Cath -  Pacemaker/ICD device last checked: Spinal Cord Stimulator:  Bowel Prep -   Sleep Study -  CPAP -   Fasting Blood Sugar -  Checks Blood Sugar _____ times a day  Last dose of GLP1 agonist-  N/A GLP1 instructions:  N/A   Last dose of SGLT-2 inhibitors-  N/A SGLT-2 instructions: N/A   Blood Thinner Instructions:  Time Aspirin Instructions: Last Dose:  Activity level:  Can go up a flight of stairs and perform activities of daily living without stopping and without symptoms of chest pain or shortness of breath.  Able to exercise without symptoms  Unable to go up a flight of stairs without symptoms of     Anesthesia review: ABN EKG, HTN, DM  Patient denies shortness of breath, fever, cough and chest pain at PAT appointment  Patient verbalized understanding of instructions that were given to them at the PAT appointment. Patient was also instructed that they will need to review over the PAT instructions again at home before surgery.

## 2023-10-01 NOTE — Progress Notes (Signed)
Please place orders for PAT appointment scheduled 10/02/23.

## 2023-10-02 ENCOUNTER — Encounter (HOSPITAL_COMMUNITY)
Admission: RE | Admit: 2023-10-02 | Discharge: 2023-10-02 | Disposition: A | Discharge status: Home / Self Care | Payer: Managed Care, Other (non HMO) | Source: Ambulatory Visit | Attending: Anesthesiology | Admitting: Anesthesiology

## 2023-10-02 ENCOUNTER — Ambulatory Visit: Payer: Managed Care, Other (non HMO)

## 2023-10-02 DIAGNOSIS — I10 Essential (primary) hypertension: Secondary | ICD-10-CM

## 2023-10-02 NOTE — Patient Instructions (Addendum)
SURGICAL WAITING ROOM VISITATION Patients having surgery or a procedure may have no more than 2 support people in the waiting area - these visitors may rotate.    Children under the age of 42 must have an adult with them who is not the patient.  If the patient needs to stay at the hospital during part of their recovery, the visitor guidelines for inpatient rooms apply. Pre-op nurse will coordinate an appropriate time for 1 support person to accompany patient in pre-op.  This support person may not rotate.    Please refer to the [facility] website for the visitor guidelines for Inpatients (after your surgery is over and you are in a regular room).       Your procedure is scheduled on: 10-15-23   Report to [facility] Main Entrance    Report to admitting at 8:45 AM   Call this number if you have problems the morning of surgery [phone]   Do not eat food :After 6:00 PM the night before surgery   After Midnight you may have the following liquids until 8:00 AM DAY OF SURGERY  Water Non-Citrus Juices (without pulp, NO RED-Apple, White grape, White cranberry) Black Coffee (NO MILK/CREAM OR CREAMERS, sugar ok)  Clear Tea (NO MILK/CREAM OR CREAMERS, sugar ok) regular and decaf                             Plain Jell-O (NO RED)                                           Fruit ices (not with fruit pulp, NO RED)                                     Popsicles (NO RED)                                                               Sports drinks like Gatorade (NO RED)                   The day of surgery:  Drink ONE (1) Pre-Surgery G2 at 8:00 AM the morning of surgery. Drink in one sitting. Do not sip.  This drink was given to you during your hospital  pre-op appointment visit. Nothing else to drink after completing the Pre-Surgery G2.          If you have questions, please contact your surgeon's office.   FOLLOW  ANY ADDITIONAL PRE OP INSTRUCTIONS YOU RECEIVED FROM YOUR  SURGEON'S OFFICE!!!     Oral Hygiene is also important to reduce your risk of infection.                                    Remember - BRUSH YOUR TEETH THE MORNING OF SURGERY WITH YOUR REGULAR TOOTHPASTE   Do NOT smoke after Midnight   Take these medicines the morning of surgery with A SIP OF WATER:   Amlodipine  Metoprolol  Okay to use inhaler  If needed Clonazepam, Promethazine, Nurtec  Stop all vitamins and herbal supplements 7 days before surgery  Bring CPAP mask and tubing day of surgery.                              You may not have any metal on your body including hair pins, jewelry, and body piercing             Do not wear make-up, lotions, powders, perfumes or deodorant  Do not wear nail polish including gel and S&S, artificial/acrylic nails, or any other type of covering on natural nails including finger and toenails. If you have artificial nails, gel coating, etc. that needs to be removed by a nail salon please have this removed prior to surgery or surgery may need to be canceled/ delayed if the surgeon/ anesthesia feels like they are unable to be safely monitored.   Do not shave  48 hours prior to surgery.    Do not bring valuables to the hospital. [facility] IS NOT RESPONSIBLE   FOR VALUABLES.   Contacts, dentures or bridgework may not be worn into surgery.   Bring small overnight bag day of surgery.   DO NOT BRING YOUR HOME MEDICATIONS TO THE HOSPITAL. PHARMACY WILL DISPENSE MEDICATIONS LISTED ON YOUR MEDICATION LIST TO YOU DURING YOUR ADMISSION IN THE HOSPITAL!     Special Instructions: Bring a copy of your healthcare power of attorney and living will documents the day of surgery if you haven't scanned them before.              Please read over the following fact sheets you were given: IF YOU HAVE QUESTIONS ABOUT YOUR PRE-OP INSTRUCTIONS PLEASE CALL [phone] Doree Fudge  If you received a COVID test during your pre-op visit  it is requested that you wear a mask  when out in public, stay away from anyone that may not be feeling well and notify your surgeon if you develop symptoms. If you test positive for Covid or have been in contact with anyone that has tested positive in the last 10 days please notify you surgeon.  [facility] - Preparing for Surgery Before surgery, you can play an important role.  Because skin is not sterile, your skin needs to be as free of germs as possible.  You can reduce the number of germs on your skin by washing with CHG (chlorahexidine gluconate) soap before surgery.  CHG is an antiseptic cleaner which kills germs and bonds with the skin to continue killing germs even after washing. Please DO NOT use if you have an allergy to CHG or antibacterial soaps.  If your skin becomes reddened/irritated stop using the CHG and inform your nurse when you arrive at Short Stay. Do not shave (including legs and underarms) for at least 48 hours prior to the first CHG shower.  You may shave your face/neck.  Please follow these instructions carefully:  1.  Shower with CHG Soap the night before surgery and the  morning of surgery.  2.  If you choose to wash your hair, wash your hair first as usual with your normal  shampoo.  3.  After you shampoo, rinse your hair and body thoroughly to remove the shampoo.                             4.  Use CHG as you would any other liquid soap.  You can apply chg directly to the skin and wash.  Gently with a scrungie or clean washcloth.  5.  Apply the CHG Soap to your body ONLY FROM THE NECK DOWN.   Do   not use on face/ open                           Wound or open sores. Avoid contact with eyes, ears mouth and   genitals (private parts).                       Wash face,  Genitals (private parts) with your normal soap.             6.  Wash thoroughly, paying special attention to the area where your    surgery  will be performed.  7.  Thoroughly rinse your body with warm water from the neck down.  8.  DO NOT  shower/wash with your normal soap after using and rinsing off the CHG Soap.                9.  Pat yourself dry with a clean towel.            10.  Wear clean pajamas.            11.  Place clean sheets on your bed the night of your first shower and do not  sleep with pets. Day of Surgery : Do not apply any lotions/deodorants the morning of surgery.  Please wear clean clothes to the hospital/surgery center.  FAILURE TO FOLLOW THESE INSTRUCTIONS MAY RESULT IN THE CANCELLATION OF YOUR SURGERY  PATIENT SIGNATURE_________________________________  NURSE SIGNATURE__________________________________  ________________________________________________________________________    Rogelia Mire  An incentive spirometer is a tool that can help keep your lungs clear and active. This tool measures how well you are filling your lungs with each breath. Taking long deep breaths may help reverse or decrease the chance of developing breathing (pulmonary) problems (especially infection) following: A long period of time when you are unable to move or be active. BEFORE THE PROCEDURE  If the spirometer includes an indicator to show your best effort, your nurse or respiratory therapist will set it to a desired goal. If possible, sit up straight or lean slightly forward. Try not to slouch. Hold the incentive spirometer in an upright position. INSTRUCTIONS FOR USE  Sit on the edge of your bed if possible, or sit up as far as you can in bed or on a chair. Hold the incentive spirometer in an upright position. Breathe out normally. Place the mouthpiece in your mouth and seal your lips tightly around it. Breathe in slowly and as deeply as possible, raising the piston or the ball toward the top of the column. Hold your breath for 3-5 seconds or for as long as possible. Allow the piston or ball to fall to the bottom of the column. Remove the mouthpiece from your mouth and breathe out normally. Rest for a few  seconds and repeat Steps 1 through 7 at least 10 times every 1-2 hours when you are awake. Take your time and take a few normal breaths between deep breaths. The spirometer may include an indicator to show your best effort. Use the indicator as a goal to work toward during each repetition. After each set of 10 deep breaths, practice coughing to be sure your  lungs are clear. If you have an incision (the cut made at the time of surgery), support your incision when coughing by placing a pillow or rolled up towels firmly against it. Once you are able to get out of bed, walk around indoors and cough well. You may stop using the incentive spirometer when instructed by your caregiver.  RISKS AND COMPLICATIONS Take your time so you do not get dizzy or light-headed. If you are in pain, you may need to take or ask for pain medication before doing incentive spirometry. It is harder to take a deep breath if you are having pain. AFTER USE Rest and breathe slowly and easily. It can be helpful to keep track of a log of your progress. Your caregiver can provide you with a simple table to help with this. If you are using the spirometer at home, follow these instructions: SEEK MEDICAL CARE IF:  You are having difficultly using the spirometer. You have trouble using the spirometer as often as instructed. Your pain medication is not giving enough relief while using the spirometer. You develop fever of 100.5 F (38.1 C) or higher. SEEK IMMEDIATE MEDICAL CARE IF:  You cough up bloody sputum that had not been present before. You develop fever of 102 F (38.9 C) or greater. You develop worsening pain at or near the incision site. MAKE SURE YOU:  Understand these instructions. Will watch your condition. Will get help right away if you are not doing well or get worse. Document Released: 04/23/2007 Document Revised: 03/04/2012 Document Reviewed: 06/24/2007 Exit[employer] Patient Information 2014 Exit[employer],  [place].   ________________________________________________________________________ WHAT IS A BLOOD TRANSFUSION? Blood Transfusion Information  A transfusion is the replacement of blood or some of its parts. Blood is made up of multiple cells which provide different functions. Red blood cells carry oxygen and are used for blood loss replacement. White blood cells fight against infection. Platelets control bleeding. Plasma helps clot blood. Other blood products are available for specialized needs, such as hemophilia or other clotting disorders. BEFORE THE TRANSFUSION  Who gives blood for transfusions?  Healthy volunteers who are fully evaluated to make sure their blood is safe. This is blood bank blood. Transfusion therapy is the safest it has ever been in the practice of medicine. Before blood is taken from a donor, a complete history is taken to make sure that person has no history of diseases nor engages in risky social behavior (examples are intravenous drug use or sexual activity with multiple partners). The donor's travel history is screened to minimize risk of transmitting infections, such as malaria. The donated blood is tested for signs of infectious diseases, such as HIV and hepatitis. The blood is then tested to be sure it is compatible with you in order to minimize the chance of a transfusion reaction. If you or a relative donates blood, this is often done in anticipation of surgery and is not appropriate for emergency situations. It takes many days to process the donated blood. RISKS AND COMPLICATIONS Although transfusion therapy is very safe and saves many lives, the main dangers of transfusion include:  Getting an infectious disease. Developing a transfusion reaction. This is an allergic reaction to something in the blood you were given. Every precaution is taken to prevent this. The decision to have a blood transfusion has been considered carefully by your caregiver before blood is  given. Blood is not given unless the benefits outweigh the risks. AFTER THE TRANSFUSION Right after receiving a blood transfusion, you  will usually feel much better and more energetic. This is especially true if your red blood cells have gotten low (anemic). The transfusion raises the level of the red blood cells which carry oxygen, and this usually causes an energy increase. The nurse administering the transfusion will monitor you carefully for complications. HOME CARE INSTRUCTIONS  No special instructions are needed after a transfusion. You may find your energy is better. Speak with your caregiver about any limitations on activity for underlying diseases you may have. SEEK MEDICAL CARE IF:  Your condition is not improving after your transfusion. You develop redness or irritation at the intravenous (IV) site. SEEK IMMEDIATE MEDICAL CARE IF:  Any of the following symptoms occur over the next 12 hours: Shaking chills. You have a temperature by mouth above 102 F (38.9 C), not controlled by medicine. Chest, back, or muscle pain. People around you feel you are not acting correctly or are confused. Shortness of breath or difficulty breathing. Dizziness and fainting. You get a rash or develop hives. You have a decrease in urine output. Your urine turns a dark color or changes to pink, red, or brown. Any of the following symptoms occur over the next 10 days: You have a temperature by mouth above 102 F (38.9 C), not controlled by medicine. Shortness of breath. Weakness after normal activity. The white part of the eye turns yellow (jaundice). You have a decrease in the amount of urine or are urinating less often. Your urine turns a dark color or changes to pink, red, or brown. Document Released: 12/08/2000 Document Revised: 03/04/2012 Document Reviewed: 07/27/2008 [facility] Patient Information 2014 [place], [place].  _______________________________________________________________________

## 2023-10-03 ENCOUNTER — Encounter (HOSPITAL_COMMUNITY)
Admission: RE | Admit: 2023-10-03 | Discharge: 2023-10-03 | Disposition: A | Discharge status: Home / Self Care | Payer: Managed Care, Other (non HMO) | Source: Ambulatory Visit | Attending: General Surgery | Admitting: General Surgery

## 2023-10-03 ENCOUNTER — Other Ambulatory Visit: Payer: Self-pay

## 2023-10-03 ENCOUNTER — Encounter (HOSPITAL_COMMUNITY): Payer: Self-pay

## 2023-10-03 VITALS — BP 164/111 | HR 76 | Temp 98.7°F | Ht 71.0 in | Wt 286.0 lb

## 2023-10-03 DIAGNOSIS — Z01818 Encounter for other preprocedural examination: Secondary | ICD-10-CM | POA: Diagnosis present

## 2023-10-03 DIAGNOSIS — I10 Essential (primary) hypertension: Secondary | ICD-10-CM | POA: Insufficient documentation

## 2023-10-03 DIAGNOSIS — D508 Other iron deficiency anemias: Secondary | ICD-10-CM | POA: Insufficient documentation

## 2023-10-03 DIAGNOSIS — Z01812 Encounter for preprocedural laboratory examination: Secondary | ICD-10-CM | POA: Insufficient documentation

## 2023-10-03 HISTORY — DX: Anxiety disorder, unspecified: F41.9

## 2023-10-03 HISTORY — DX: Pneumonia, unspecified organism: J18.9

## 2023-10-03 LAB — TYPE AND SCREEN
ABO/RH(D): A POS
Antibody Screen: NEGATIVE

## 2023-10-03 LAB — COMPREHENSIVE METABOLIC PANEL
ALT: 16 U/L (ref 0–44)
AST: 13 U/L — ABNORMAL LOW (ref 15–41)
Albumin: 3.7 g/dL (ref 3.5–5.0)
Alkaline Phosphatase: 59 U/L (ref 38–126)
Anion gap: 7 (ref 5–15)
BUN: 8 mg/dL (ref 6–20)
CO2: 26 mmol/L (ref 22–32)
Calcium: 8.8 mg/dL — ABNORMAL LOW (ref 8.9–10.3)
Chloride: 105 mmol/L (ref 98–111)
Creatinine, Ser: 0.67 mg/dL (ref 0.44–1.00)
GFR, Estimated: >60 mL/min (ref >60)
Glucose, Bld: 93 mg/dL (ref 70–99)
Potassium: 3.2 mmol/L — ABNORMAL LOW (ref 3.5–5.1)
Sodium: 138 mmol/L (ref 135–145)
Total Bilirubin: 0.6 mg/dL (ref 0.3–1.2)
Total Protein: 7 g/dL (ref 6.5–8.1)

## 2023-10-03 LAB — CBC
HCT: 44.8 % (ref 36.0–46.0)
Hemoglobin: 13.9 g/dL (ref 12.0–15.0)
MCH: 27.6 pg (ref 26.0–34.0)
MCHC: 31 g/dL (ref 30.0–36.0)
MCV: 88.9 fL (ref 80.0–100.0)
Platelets: 172 10*3/uL (ref 150–400)
RBC: 5.04 MIL/uL (ref 3.87–5.11)
RDW: 14 % (ref 11.5–15.5)
WBC: 6 10*3/uL (ref 4.0–10.5)
nRBC: 0 % (ref 0.0–0.2)

## 2023-10-03 LAB — GLUCOSE, CAPILLARY: Glucose-Capillary: 101 mg/dL — ABNORMAL HIGH (ref 70–99)

## 2023-10-03 NOTE — Progress Notes (Signed)
BP elevate during PST appointment. As per pt. She did not take her adderall today. BP: 170/113 ; 164/111

## 2023-10-04 NOTE — Anesthesia Preprocedure Evaluation (Addendum)
Anesthesia Evaluation  Patient identified by MRN, date of birth, ID band Patient awake    Reviewed: Allergy & Precautions, NPO status , Patient's Chart, lab work & pertinent test results  History of Anesthesia Complications Negative for: history of anesthetic complications  Airway Mallampati: II  TM Distance: >3 FB Neck ROM: Full   Comment: Previous grade I view with Glidescope 3 Dental  (+) Dental Advisory Given   Pulmonary neg shortness of breath, asthma (no recent flares) , neg sleep apnea, neg recent URI   Pulmonary exam normal breath sounds clear to auscultation       Cardiovascular hypertension (amlodipine, losartan-HCTZ, metoprolol), Pt. on medications and Pt. on home beta blockers (-) angina (-) Past MI, (-) Cardiac Stents and (-) CABG (-) dysrhythmias  Rhythm:Regular Rate:Normal  HLD  TTE 05/07/2023: IMPRESSIONS     1. 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 Posterior wall up to  14 mm. No signs of LVOT obstruction. Would consider CMR for better  characterization if clinically inidicated. Left ventricular ejection  fraction, by estimation, is 60 to 65%. The  left ventricle has normal function. The left ventricle has no regional  wall motion abnormalities. 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 Left ventricular diastolic parameters  are consistent with Grade I diastolic  dysfunction (impaired relaxation).   2. Right ventricular systolic function is normal. The right ventricular  size is normal. Tricuspid regurgitation signal is inadequate for assessing  PA pressure.   3. The mitral valve is grossly normal. Trivial mitral valve  regurgitation. No evidence of mitral stenosis.   4. The aortic valve is grossly normal. Aortic valve regurgitation is  trivial. No aortic stenosis is present.   5. There is mild dilatation of the ascending aorta, measuring 40 mm.   6. The  inferior vena cava is normal in size with greater than 50%  respiratory variability, suggesting right atrial pressure of 3 mmHg.     Neuro/Psych  Headaches, neg Seizures PSYCHIATRIC DISORDERS (ADHD, panic disorder) Anxiety        GI/Hepatic ,GERD  Medicated,,(+) Hepatitis -, BH/o colon cancer, h/o pancreatitis   Endo/Other  diabetes  Morbid obesity  Renal/GU negative Renal ROS     Musculoskeletal  (+) Arthritis ,    Abdominal  (+) + obese  Peds  Hematology  (+) Blood dyscrasia, anemia Lab Results      Component                Value               Date                      WBC                      6.0                 10/03/2023                HGB                      13.9                10/03/2023                HCT  44.8                10/03/2023                MCV                      88.9                10/03/2023                PLT                      172                 10/03/2023              Anesthesia Other Findings   Reproductive/Obstetrics                             Anesthesia Physical Anesthesia Plan  ASA: 3  Anesthesia Plan: General   Post-op Pain Management: Tylenol PO (pre-op)*   Induction: Intravenous  PONV Risk Score and Plan: 3 and Ondansetron, Dexamethasone, Treatment may vary due to age or medical condition and Midazolam  Airway Management Planned: Oral ETT  Additional Equipment:   Intra-op Plan:   Post-operative Plan: Extubation in OR  Informed Consent: I have reviewed the patients History and Physical, chart, labs and discussed the procedure including the risks, benefits and alternatives for the proposed anesthesia with the patient or authorized representative who has indicated his/her understanding and acceptance.     Dental advisory given  Plan Discussed with: CRNA and Anesthesiologist  Anesthesia Plan Comments: (See PAT note from 9/11 by K Gekas PA-C  Risks of general anesthesia  discussed including, but not limited to, sore throat, hoarse voice, chipped/damaged teeth, injury to vocal cords, nausea and vomiting, allergic reactions, lung infection, heart attack, stroke, and death. All questions answered. )        Anesthesia Quick Evaluation

## 2023-10-06 ENCOUNTER — Other Ambulatory Visit: Payer: Self-pay | Admitting: Physician Assistant

## 2023-10-15 ENCOUNTER — Inpatient Hospital Stay (HOSPITAL_COMMUNITY): Payer: Self-pay | Admitting: Anesthesiology

## 2023-10-15 ENCOUNTER — Encounter (HOSPITAL_COMMUNITY): Payer: Self-pay | Admitting: General Surgery

## 2023-10-15 ENCOUNTER — Other Ambulatory Visit: Payer: Self-pay

## 2023-10-15 ENCOUNTER — Inpatient Hospital Stay (HOSPITAL_COMMUNITY): Payer: Managed Care, Other (non HMO) | Admitting: Medical

## 2023-10-15 ENCOUNTER — Inpatient Hospital Stay (HOSPITAL_COMMUNITY)
Admission: RE | Admit: 2023-10-15 | Discharge: 2023-10-16 | DRG: 620 | Disposition: A | Discharge status: Home / Self Care | Payer: Managed Care, Other (non HMO) | Attending: General Surgery | Admitting: General Surgery

## 2023-10-15 ENCOUNTER — Encounter (HOSPITAL_COMMUNITY)
Admission: RE | Disposition: A | Discharge status: Home / Self Care | Payer: Self-pay | Source: Home / Self Care | Attending: General Surgery

## 2023-10-15 DIAGNOSIS — L03113 Cellulitis of right upper limb: Secondary | ICD-10-CM | POA: Diagnosis present

## 2023-10-15 DIAGNOSIS — Z9884 Bariatric surgery status: Principal | ICD-10-CM

## 2023-10-15 DIAGNOSIS — R7303 Prediabetes: Secondary | ICD-10-CM | POA: Diagnosis present

## 2023-10-15 DIAGNOSIS — K660 Peritoneal adhesions (postprocedural) (postinfection): Secondary | ICD-10-CM | POA: Diagnosis present

## 2023-10-15 DIAGNOSIS — Z8619 Personal history of other infectious and parasitic diseases: Secondary | ICD-10-CM | POA: Diagnosis not present

## 2023-10-15 DIAGNOSIS — G43009 Migraine without aura, not intractable, without status migrainosus: Secondary | ICD-10-CM | POA: Diagnosis present

## 2023-10-15 DIAGNOSIS — F909 Attention-deficit hyperactivity disorder, unspecified type: Secondary | ICD-10-CM | POA: Diagnosis present

## 2023-10-15 DIAGNOSIS — Z85038 Personal history of other malignant neoplasm of large intestine: Secondary | ICD-10-CM | POA: Diagnosis not present

## 2023-10-15 DIAGNOSIS — W57XXXA Bitten or stung by nonvenomous insect and other nonvenomous arthropods, initial encounter: Secondary | ICD-10-CM | POA: Diagnosis present

## 2023-10-15 DIAGNOSIS — E782 Mixed hyperlipidemia: Secondary | ICD-10-CM | POA: Diagnosis present

## 2023-10-15 DIAGNOSIS — Z833 Family history of diabetes mellitus: Secondary | ICD-10-CM

## 2023-10-15 DIAGNOSIS — Z9049 Acquired absence of other specified parts of digestive tract: Secondary | ICD-10-CM

## 2023-10-15 DIAGNOSIS — E559 Vitamin D deficiency, unspecified: Secondary | ICD-10-CM | POA: Diagnosis present

## 2023-10-15 DIAGNOSIS — Z888 Allergy status to other drugs, medicaments and biological substances status: Secondary | ICD-10-CM | POA: Diagnosis not present

## 2023-10-15 DIAGNOSIS — Z8249 Family history of ischemic heart disease and other diseases of the circulatory system: Secondary | ICD-10-CM

## 2023-10-15 DIAGNOSIS — M1990 Unspecified osteoarthritis, unspecified site: Secondary | ICD-10-CM | POA: Diagnosis present

## 2023-10-15 DIAGNOSIS — G43909 Migraine, unspecified, not intractable, without status migrainosus: Secondary | ICD-10-CM | POA: Diagnosis present

## 2023-10-15 DIAGNOSIS — Z6841 Body Mass Index (BMI) 40.0 and over, adult: Secondary | ICD-10-CM

## 2023-10-15 DIAGNOSIS — M722 Plantar fascial fibromatosis: Secondary | ICD-10-CM | POA: Diagnosis present

## 2023-10-15 DIAGNOSIS — I10 Essential (primary) hypertension: Secondary | ICD-10-CM | POA: Diagnosis present

## 2023-10-15 DIAGNOSIS — Z01818 Encounter for other preprocedural examination: Secondary | ICD-10-CM

## 2023-10-15 DIAGNOSIS — K5909 Other constipation: Secondary | ICD-10-CM | POA: Diagnosis present

## 2023-10-15 HISTORY — PX: GASTRIC ROUX-EN-Y: SHX5262

## 2023-10-15 LAB — GLUCOSE, CAPILLARY
Glucose-Capillary: 143 mg/dL — ABNORMAL HIGH (ref 70–99)
Glucose-Capillary: 131 mg/dL — ABNORMAL HIGH (ref 70–99)
Glucose-Capillary: 154 mg/dL — ABNORMAL HIGH (ref 70–99)

## 2023-10-15 LAB — HEMOGLOBIN AND HEMATOCRIT, BLOOD
HCT: 43.1 % (ref 36.0–46.0)
Hemoglobin: 13.8 g/dL (ref 12.0–15.0)

## 2023-10-15 SURGERY — LAPAROSCOPIC ROUX-EN-Y GASTRIC BYPASS WITH UPPER ENDOSCOPY
Anesthesia: General

## 2023-10-15 MED ORDER — ORAL CARE MOUTH RINSE: 15.0000 mL | Freq: Once | OROMUCOSAL | Status: AC

## 2023-10-15 MED ORDER — HEPARIN SODIUM (PORCINE) 5000 UNIT/ML IJ SOLN
5000.0000 [IU] | Freq: Three times a day (TID) | INTRAMUSCULAR | Status: DC
  Administered 2023-10-15 – 2023-10-16 (×3): 5000 [IU] via SUBCUTANEOUS
  Filled 2023-10-15 (×3): qty 1

## 2023-10-15 MED ORDER — LACTATED RINGERS IV SOLN: INTRAVENOUS | Status: DC

## 2023-10-15 MED ORDER — BUPIVACAINE LIPOSOME 1.3 % IJ SUSP
INTRAMUSCULAR | Status: AC
  Filled 2023-10-15: qty 20

## 2023-10-15 MED ORDER — BUPIVACAINE LIPOSOME 1.3 % IJ SUSP: 20.0000 mL | Freq: Once | INTRAMUSCULAR | Status: DC

## 2023-10-15 MED ORDER — BUPIVACAINE LIPOSOME 1.3 % IJ SUSP
INTRAMUSCULAR | Status: DC | PRN
  Administered 2023-10-15: 20 mL

## 2023-10-15 MED ORDER — DEXAMETHASONE SODIUM PHOSPHATE 4 MG/ML IJ SOLN
4.0000 mg | INTRAMUSCULAR | Status: AC
  Administered 2023-10-15 (×2): 5 mg via INTRAVENOUS

## 2023-10-15 MED ORDER — ALBUTEROL SULFATE HFA 108 (90 BASE) MCG/ACT IN AERS: 2.0000 | INHALATION_SPRAY | RESPIRATORY_TRACT | Status: DC | PRN

## 2023-10-15 MED ORDER — MIDAZOLAM HCL 2 MG/2ML IJ SOLN
INTRAMUSCULAR | Status: AC
  Filled 2023-10-15: qty 2

## 2023-10-15 MED ORDER — VISTASEAL 4 ML SINGLE DOSE KIT: 4.0000 mL | PACK | Freq: Once | CUTANEOUS | Status: DC

## 2023-10-15 MED ORDER — LACTATED RINGERS IR SOLN
Status: DC | PRN
  Administered 2023-10-15: 1000 mL

## 2023-10-15 MED ORDER — ROCURONIUM BROMIDE 10 MG/ML (PF) SYRINGE
PREFILLED_SYRINGE | INTRAVENOUS | Status: AC
  Filled 2023-10-15: qty 10

## 2023-10-15 MED ORDER — ROCURONIUM BROMIDE 10 MG/ML (PF) SYRINGE
PREFILLED_SYRINGE | INTRAVENOUS | Status: DC | PRN
  Administered 2023-10-15: 20 mg via INTRAVENOUS
  Administered 2023-10-15: 10 mg via INTRAVENOUS
  Administered 2023-10-15: 80 mg via INTRAVENOUS

## 2023-10-15 MED ORDER — PROPOFOL 10 MG/ML IV BOLUS
INTRAVENOUS | Status: DC | PRN
  Administered 2023-10-15: 200 mg via INTRAVENOUS

## 2023-10-15 MED ORDER — PHENYLEPHRINE HCL-NACL 20-0.9 MG/250ML-% IV SOLN
INTRAVENOUS | Status: DC | PRN
  Administered 2023-10-15: 20 ug/min via INTRAVENOUS

## 2023-10-15 MED ORDER — FENTANYL CITRATE (PF) 250 MCG/5ML IJ SOLN
INTRAMUSCULAR | Status: AC
  Filled 2023-10-15: qty 5

## 2023-10-15 MED ORDER — EPHEDRINE SULFATE-NACL 50-0.9 MG/10ML-% IV SOSY
PREFILLED_SYRINGE | INTRAVENOUS | Status: DC | PRN
  Administered 2023-10-15: 10 mg via INTRAVENOUS
  Administered 2023-10-15: 5 mg via INTRAVENOUS
  Administered 2023-10-15: 10 mg via INTRAVENOUS

## 2023-10-15 MED ORDER — ACETAMINOPHEN 500 MG PO TABS
1000.0000 mg | ORAL_TABLET | ORAL | Status: AC
  Administered 2023-10-15: 1000 mg via ORAL
  Filled 2023-10-15: qty 2

## 2023-10-15 MED ORDER — KCL IN DEXTROSE-NACL 20-5-0.45 MEQ/L-%-% IV SOLN
INTRAVENOUS | Status: DC
  Filled 2023-10-15 (×3): qty 1000

## 2023-10-15 MED ORDER — PHENYLEPHRINE 80 MCG/ML (10ML) SYRINGE FOR IV PUSH (FOR BLOOD PRESSURE SUPPORT)
PREFILLED_SYRINGE | INTRAVENOUS | Status: DC | PRN
  Administered 2023-10-15 (×2): 80 ug via INTRAVENOUS
  Administered 2023-10-15: 160 ug via INTRAVENOUS

## 2023-10-15 MED ORDER — ONDANSETRON HCL 4 MG/2ML IJ SOLN
INTRAMUSCULAR | Status: DC | PRN
  Administered 2023-10-15: 4 mg via INTRAVENOUS

## 2023-10-15 MED ORDER — ALBUTEROL SULFATE (2.5 MG/3ML) 0.083% IN NEBU: 2.5000 mg | INHALATION_SOLUTION | RESPIRATORY_TRACT | Status: DC | PRN

## 2023-10-15 MED ORDER — OXYCODONE HCL 5 MG/5ML PO SOLN
5.0000 mg | Freq: Four times a day (QID) | ORAL | Status: DC | PRN
  Administered 2023-10-15 – 2023-10-16 (×3): 5 mg via ORAL
  Filled 2023-10-15 (×3): qty 5

## 2023-10-15 MED ORDER — DEXAMETHASONE SODIUM PHOSPHATE 10 MG/ML IJ SOLN
INTRAMUSCULAR | Status: AC
  Filled 2023-10-15: qty 1

## 2023-10-15 MED ORDER — FIBRIN SEALANT 2 ML SINGLE DOSE KIT: 2.0000 mL | PACK | Freq: Once | CUTANEOUS | Status: DC

## 2023-10-15 MED ORDER — AMLODIPINE BESYLATE 10 MG PO TABS
10.0000 mg | ORAL_TABLET | Freq: Every day | ORAL | Status: DC
  Administered 2023-10-16: 10 mg via ORAL
  Filled 2023-10-15: qty 1

## 2023-10-15 MED ORDER — ONDANSETRON HCL 4 MG/2ML IJ SOLN
4.0000 mg | Freq: Four times a day (QID) | INTRAMUSCULAR | Status: DC | PRN
  Administered 2023-10-16: 4 mg via INTRAVENOUS
  Filled 2023-10-15: qty 2

## 2023-10-15 MED ORDER — STERILE WATER FOR IRRIGATION IR SOLN
Status: DC | PRN
Start: 2023-10-15 — End: 2023-10-15
  Administered 2023-10-15: 1000 mL

## 2023-10-15 MED ORDER — FENTANYL CITRATE (PF) 100 MCG/2ML IJ SOLN
INTRAMUSCULAR | Status: DC | PRN
  Administered 2023-10-15: 100 ug via INTRAVENOUS
  Administered 2023-10-15: 25 ug via INTRAVENOUS

## 2023-10-15 MED ORDER — KETAMINE HCL 10 MG/ML IJ SOLN
INTRAMUSCULAR | Status: DC | PRN
  Administered 2023-10-15 (×2): 10 mg via INTRAVENOUS
  Administered 2023-10-15: 30 mg via INTRAVENOUS

## 2023-10-15 MED ORDER — LIDOCAINE HCL (PF) 2 % IJ SOLN
INTRAMUSCULAR | Status: DC | PRN
  Administered 2023-10-15: 1.5 mg/kg/h via INTRADERMAL

## 2023-10-15 MED ORDER — LOSARTAN POTASSIUM-HCTZ 100-25 MG PO TABS: 1.0000 | ORAL_TABLET | Freq: Every day | ORAL | Status: DC

## 2023-10-15 MED ORDER — MIDAZOLAM HCL 5 MG/5ML IJ SOLN
INTRAMUSCULAR | Status: DC | PRN
  Administered 2023-10-15: 2 mg via INTRAVENOUS

## 2023-10-15 MED ORDER — AMISULPRIDE (ANTIEMETIC) 5 MG/2ML IV SOLN: 10.0000 mg | Freq: Once | INTRAVENOUS | Status: DC | PRN

## 2023-10-15 MED ORDER — CHLORHEXIDINE GLUCONATE 4 % EX SOLN: Freq: Once | CUTANEOUS | Status: DC

## 2023-10-15 MED ORDER — SCOPOLAMINE 1 MG/3DAYS TD PT72
1.0000 | MEDICATED_PATCH | TRANSDERMAL | Status: DC
  Administered 2023-10-15: 1.5 mg via TRANSDERMAL
  Filled 2023-10-15: qty 1

## 2023-10-15 MED ORDER — GABAPENTIN 100 MG PO CAPS
100.0000 mg | ORAL_CAPSULE | Freq: Two times a day (BID) | ORAL | Status: DC
  Administered 2023-10-15 – 2023-10-16 (×2): 100 mg via ORAL
  Filled 2023-10-15 (×2): qty 1

## 2023-10-15 MED ORDER — SODIUM CHLORIDE 0.9 % IV SOLN
2.0000 g | INTRAVENOUS | Status: AC
  Administered 2023-10-15: 2 g via INTRAVENOUS
  Filled 2023-10-15: qty 2

## 2023-10-15 MED ORDER — APREPITANT 40 MG PO CAPS
40.0000 mg | ORAL_CAPSULE | ORAL | Status: AC
  Administered 2023-10-15: 40 mg via ORAL
  Filled 2023-10-15: qty 1

## 2023-10-15 MED ORDER — HEPARIN SODIUM (PORCINE) 5000 UNIT/ML IJ SOLN
5000.0000 [IU] | INTRAMUSCULAR | Status: AC
  Administered 2023-10-15: 5000 [IU] via SUBCUTANEOUS
  Filled 2023-10-15: qty 1

## 2023-10-15 MED ORDER — RIMEGEPANT SULFATE 75 MG PO TBDP: 75.0000 mg | ORAL_TABLET | Freq: Every day | ORAL | Status: DC | PRN

## 2023-10-15 MED ORDER — SODIUM CHLORIDE 0.9 % IV SOLN: 12.5000 mg | Freq: Four times a day (QID) | INTRAVENOUS | Status: DC | PRN

## 2023-10-15 MED ORDER — ONDANSETRON HCL 4 MG/2ML IJ SOLN
INTRAMUSCULAR | Status: AC
  Filled 2023-10-15: qty 2

## 2023-10-15 MED ORDER — HYDROCHLOROTHIAZIDE 25 MG PO TABS
25.0000 mg | ORAL_TABLET | Freq: Every day | ORAL | Status: DC
  Administered 2023-10-16: 25 mg via ORAL
  Filled 2023-10-15: qty 1

## 2023-10-15 MED ORDER — MORPHINE SULFATE (PF) 2 MG/ML IV SOLN
1.0000 mg | INTRAVENOUS | Status: DC | PRN
Start: 2023-10-15 — End: 2023-10-16

## 2023-10-15 MED ORDER — PANTOPRAZOLE SODIUM 40 MG IV SOLR
40.0000 mg | Freq: Every day | INTRAVENOUS | Status: DC
  Administered 2023-10-15: 40 mg via INTRAVENOUS
  Filled 2023-10-15: qty 10

## 2023-10-15 MED ORDER — INSULIN ASPART 100 UNIT/ML IJ SOLN
INTRAMUSCULAR | Status: AC
  Filled 2023-10-15: qty 1

## 2023-10-15 MED ORDER — LIDOCAINE HCL 2 % IJ SOLN
INTRAMUSCULAR | Status: AC
  Filled 2023-10-15: qty 20

## 2023-10-15 MED ORDER — HYDROMORPHONE HCL 1 MG/ML IJ SOLN
0.2500 mg | INTRAMUSCULAR | Status: DC | PRN
  Administered 2023-10-15 (×2): 0.25 mg via INTRAVENOUS

## 2023-10-15 MED ORDER — BUPIVACAINE-EPINEPHRINE 0.25% -1:200000 IJ SOLN
INTRAMUSCULAR | Status: DC | PRN
  Administered 2023-10-15: 30 mL

## 2023-10-15 MED ORDER — HYDROMORPHONE HCL 1 MG/ML IJ SOLN
INTRAMUSCULAR | Status: AC
  Filled 2023-10-15: qty 1

## 2023-10-15 MED ORDER — METOPROLOL SUCCINATE ER 50 MG PO TB24
50.0000 mg | ORAL_TABLET | Freq: Every morning | ORAL | Status: DC
  Administered 2023-10-16: 50 mg via ORAL
  Filled 2023-10-15: qty 1

## 2023-10-15 MED ORDER — ACETAMINOPHEN 160 MG/5ML PO SOLN
1000.0000 mg | Freq: Three times a day (TID) | ORAL | Status: DC
  Administered 2023-10-15: 1000 mg via ORAL
  Filled 2023-10-15: qty 40.6

## 2023-10-15 MED ORDER — HYDROCORTISONE (PERIANAL) 2.5 % EX CREA: 1.0000 | TOPICAL_CREAM | Freq: Two times a day (BID) | CUTANEOUS | Status: DC | PRN

## 2023-10-15 MED ORDER — LIDOCAINE 2% (20 MG/ML) 5 ML SYRINGE
INTRAMUSCULAR | Status: DC | PRN
  Administered 2023-10-15: 100 mg via INTRAVENOUS

## 2023-10-15 MED ORDER — KETAMINE HCL 50 MG/5ML IJ SOSY
PREFILLED_SYRINGE | INTRAMUSCULAR | Status: AC
  Filled 2023-10-15: qty 5

## 2023-10-15 MED ORDER — CHLORHEXIDINE GLUCONATE 0.12 % MT SOLN
15.0000 mL | Freq: Once | OROMUCOSAL | Status: AC
  Administered 2023-10-15: 15 mL via OROMUCOSAL

## 2023-10-15 MED ORDER — SIMETHICONE 80 MG PO CHEW
80.0000 mg | CHEWABLE_TABLET | Freq: Four times a day (QID) | ORAL | Status: DC | PRN
  Administered 2023-10-15 – 2023-10-16 (×2): 80 mg via ORAL
  Filled 2023-10-15 (×2): qty 1

## 2023-10-15 MED ORDER — ACETAMINOPHEN 500 MG PO TABS
1000.0000 mg | ORAL_TABLET | Freq: Three times a day (TID) | ORAL | Status: DC
  Administered 2023-10-15 – 2023-10-16 (×3): 1000 mg via ORAL
  Filled 2023-10-15 (×3): qty 2

## 2023-10-15 MED ORDER — ONDANSETRON HCL 4 MG/2ML IJ SOLN
INTRAMUSCULAR | Status: AC
  Administered 2023-10-15: 4 mg
  Filled 2023-10-15: qty 2

## 2023-10-15 MED ORDER — ENSURE MAX PROTEIN PO LIQD
2.0000 [oz_av] | ORAL | Status: DC
  Administered 2023-10-16 (×4): 2 [oz_av] via ORAL

## 2023-10-15 MED ORDER — LOSARTAN POTASSIUM 50 MG PO TABS
100.0000 mg | ORAL_TABLET | Freq: Every day | ORAL | Status: DC
  Administered 2023-10-16: 100 mg via ORAL
  Filled 2023-10-15: qty 2

## 2023-10-15 MED ORDER — BUPIVACAINE-EPINEPHRINE 0.25% -1:200000 IJ SOLN
INTRAMUSCULAR | Status: AC
  Filled 2023-10-15: qty 1

## 2023-10-15 MED ORDER — HYDRALAZINE HCL 20 MG/ML IJ SOLN: 10.0000 mg | INTRAMUSCULAR | Status: DC | PRN

## 2023-10-15 MED ORDER — INSULIN ASPART 100 UNIT/ML IJ SOLN
0.0000 [IU] | INTRAMUSCULAR | Status: DC | PRN
Start: 2023-10-15 — End: 2023-10-15
  Administered 2023-10-15: 2 [IU] via SUBCUTANEOUS

## 2023-10-15 MED ORDER — SUGAMMADEX SODIUM 500 MG/5ML IV SOLN
INTRAVENOUS | Status: DC | PRN
  Administered 2023-10-15 (×2): 100 mg via INTRAVENOUS

## 2023-10-15 MED ORDER — AMITRIPTYLINE HCL 50 MG PO TABS
75.0000 mg | ORAL_TABLET | Freq: Every day | ORAL | Status: DC
  Administered 2023-10-15: 75 mg via ORAL
  Filled 2023-10-15: qty 1

## 2023-10-15 SURGICAL SUPPLY — 87 items
ANTIFOG SOL W/FOAM PAD STRL (MISCELLANEOUS) ×1
APL LAPSCP 35 DL APL RGD (MISCELLANEOUS) ×2
APL PRP STRL LF DISP 70% ISPRP (MISCELLANEOUS) ×2
APL SWBSTK 6 STRL LF DISP (MISCELLANEOUS)
APPLICATOR COTTON TIP 6 STRL (MISCELLANEOUS) IMPLANT
APPLICATOR COTTON TIP 6IN STRL (MISCELLANEOUS)
APPLICATOR VISTASEAL 35 (MISCELLANEOUS) ×2 IMPLANT
APPLIER CLIP ROT 13.4 12 LRG (CLIP)
APR CLP LRG 13.4X12 ROT 20 MLT (CLIP)
BAG COUNTER SPONGE SURGICOUNT (BAG) IMPLANT
BAG LAPAROSCOPIC 12 15 PORT 16 (BASKET) IMPLANT
BAG RETRIEVAL 12/15 (BASKET) ×1
BAG SPNG CNTER NS LX DISP (BAG)
BLADE SURG SZ11 CARB STEEL (BLADE) ×1 IMPLANT
CABLE HIGH FREQUENCY MONO STRZ (ELECTRODE) IMPLANT
CHLORAPREP W/TINT 26 (MISCELLANEOUS) ×2 IMPLANT
CLIP APPLIE ROT 13.4 12 LRG (CLIP) IMPLANT
CLIP SUT LAPRA TY ABSORB (SUTURE) ×2 IMPLANT
CUTTER FLEX LINEAR 45M (STAPLE) IMPLANT
DEVICE SUT QUICK LOAD TK 5 (SUTURE) IMPLANT
DEVICE SUT TI-KNOT TK 5X26 (SUTURE) IMPLANT
DEVICE SUTURE ENDOST 10MM (ENDOMECHANICALS) ×1 IMPLANT
DRAIN PENROSE 0.25X18 (DRAIN) ×1 IMPLANT
DRSG TEGADERM 2-3/8X2-3/4 SM (GAUZE/BANDAGES/DRESSINGS) ×6 IMPLANT
DRSG TEGADERM 6X8 (GAUZE/BANDAGES/DRESSINGS) IMPLANT
ELECT REM PT RETURN 15FT ADLT (MISCELLANEOUS) ×1 IMPLANT
GAUZE 4X4 16PLY ~~LOC~~+RFID DBL (SPONGE) ×1 IMPLANT
GAUZE SPONGE 2X2 8PLY STRL LF (GAUZE/BANDAGES/DRESSINGS) ×1 IMPLANT
GAUZE SPONGE 4X4 12PLY STRL (GAUZE/BANDAGES/DRESSINGS) IMPLANT
GLOVE BIO SURGEON STRL SZ7.5 (GLOVE) ×1 IMPLANT
GLOVE INDICATOR 8.0 STRL GRN (GLOVE) ×1 IMPLANT
GOWN STRL REUS W/ TWL XL LVL3 (GOWN DISPOSABLE) ×4 IMPLANT
GOWN STRL REUS W/TWL XL LVL3 (GOWN DISPOSABLE) ×4
GRASPER SUT TROCAR 14GX15 (MISCELLANEOUS) IMPLANT
IRRIG SUCT STRYKERFLOW 2 WTIP (MISCELLANEOUS) ×1
IRRIGATION SUCT STRKRFLW 2 WTP (MISCELLANEOUS) ×1 IMPLANT
KIT BASIN OR (CUSTOM PROCEDURE TRAY) ×1 IMPLANT
KIT GASTRIC LAVAGE 34FR ADT (SET/KITS/TRAYS/PACK) ×1 IMPLANT
KIT TURNOVER KIT A (KITS) IMPLANT
MARKER SKIN DUAL TIP RULER LAB (MISCELLANEOUS) ×1 IMPLANT
MAT PREVALON FULL STRYKER (MISCELLANEOUS) ×1 IMPLANT
NDL SPNL 22GX3.5 QUINCKE BK (NEEDLE) ×1 IMPLANT
NEEDLE SPNL 22GX3.5 QUINCKE BK (NEEDLE) ×1
PACK CARDIOVASCULAR III (CUSTOM PROCEDURE TRAY) ×1 IMPLANT
RELOAD 45 VASCULAR/THIN (ENDOMECHANICALS)
RELOAD ENDO STITCH 2.0 (ENDOMECHANICALS) ×9
RELOAD STAPLE 45 2.5 WHT GRN (ENDOMECHANICALS) IMPLANT
RELOAD STAPLE 45 3.5 BLU ETS (ENDOMECHANICALS) IMPLANT
RELOAD STAPLE 60 2.6 WHT THN (STAPLE) ×2 IMPLANT
RELOAD STAPLE 60 3.6 BLU REG (STAPLE) ×2 IMPLANT
RELOAD STAPLE 60 3.8 GOLD REG (STAPLE) ×1 IMPLANT
RELOAD STAPLE TA45 3.5 REG BLU (ENDOMECHANICALS)
RELOAD SUT SNGL STCH ABSRB 2-0 (ENDOMECHANICALS) ×5 IMPLANT
RELOAD SUT SNGL STCH BLK 2-0 (ENDOMECHANICALS) ×4 IMPLANT
SCISSORS LAP 5X45 EPIX DISP (ENDOMECHANICALS) ×1 IMPLANT
SET TUBE SMOKE EVAC HIGH FLOW (TUBING) ×1 IMPLANT
SHEARS HARMONIC 45 ACE (MISCELLANEOUS) ×1 IMPLANT
SLEEVE ADV FIXATION 12X100MM (TROCAR) ×2 IMPLANT
SLEEVE ADV FIXATION 5X100MM (TROCAR) IMPLANT
SOLUTION ANTFG W/FOAM PAD STRL (MISCELLANEOUS) ×1 IMPLANT
STAPLE LINE REINFORCEMENT LAP (STAPLE) IMPLANT
STAPLER ECHELON BIOABSB 60 FLE (MISCELLANEOUS) IMPLANT
STAPLER ECHELON LONG 3000 60 (ENDOMECHANICALS) IMPLANT
STAPLER ECHELON LONG 60 440 (INSTRUMENTS) ×1 IMPLANT
STAPLER RELOAD BLUE 60MM (STAPLE) ×4
STAPLER RELOAD GOLD 60MM (STAPLE) ×1
STAPLER RELOAD WHITE 60MM (STAPLE) ×1
STRIP CLOSURE SKIN 1/2X4 (GAUZE/BANDAGES/DRESSINGS) ×1 IMPLANT
SURGILUBE 2OZ TUBE FLIPTOP (MISCELLANEOUS) ×1 IMPLANT
SUT MNCRL AB 4-0 PS2 18 (SUTURE) ×1 IMPLANT
SUT RELOAD ENDO STITCH 2 48X1 (ENDOMECHANICALS) ×5
SUT RELOAD ENDO STITCH 2.0 (ENDOMECHANICALS) ×4
SUT SURGIDAC NAB ES-9 0 48 120 (SUTURE) IMPLANT
SUT VIC AB 2-0 SH 27 (SUTURE) ×1
SUT VIC AB 2-0 SH 27X BRD (SUTURE) ×1 IMPLANT
SUT VICRYL 0 TIES 12 18 (SUTURE) IMPLANT
SYR 20ML LL LF (SYRINGE) ×2 IMPLANT
SYS KII OPTICAL ACCESS 15MM (TROCAR) ×1
SYSTEM KII OPTICAL ACCESS 15MM (TROCAR) IMPLANT
TAPE STRIPS DRAPE STRL (GAUZE/BANDAGES/DRESSINGS) IMPLANT
TOWEL OR 17X26 10 PK STRL BLUE (TOWEL DISPOSABLE) ×1 IMPLANT
TOWEL OR NON WOVEN STRL DISP B (DISPOSABLE) ×1 IMPLANT
TRAY FOLEY MTR SLVR 16FR STAT (SET/KITS/TRAYS/PACK) IMPLANT
TROCAR ADV FIXATION 12X100MM (TROCAR) ×1 IMPLANT
TROCAR ADV FIXATION 5X100MM (TROCAR) ×1 IMPLANT
TROCAR XCEL NON-BLD 5MMX100MML (ENDOMECHANICALS) ×1 IMPLANT
TUBING CONNECTING 10 (TUBING) ×2 IMPLANT

## 2023-10-15 NOTE — Progress Notes (Signed)
Patient has 2 areas on lower right forearm.  " I think it was a spider bite. Has seen MD x2 for area.  Round, raised reddened area. Pt states "itiching all the time"   has been 3-4 weeks since noticed area

## 2023-10-15 NOTE — H&P (Signed)
CC: here for surgery  Requesting provider: n/a  HPI: Shelly Larson is an 52 y.o. female who is here for lap Roux-en-Y gastric bypass, with upper endoscopy possible incisional hernia repair.  Patient last seen in the clinic in mid September.  She denies any medical changes since I last saw her.  Old hpi: Her comorbidities include hypertension, chronic migraines, osteoarthritis, plantar fasciitis, prediabetes, history of colon cancer 2016 stage II, remote history of hepatitis B   She states in 2016 she was having fatigue and went to the emergency room was found to be anemic which led to a colonoscopy which then led to transverse colon surgery for colon cancer.   Denies any medical changes since I saw her in March other than undergoing hemorrhoidectomy in the summer. She states that it was sort of a rough recovery. Otherwise she is doing well. No chest pain, chest pressure, shortness of breath, dyspnea on exertion, no abdominal pain. She does have chronic constipation and does take Linzess daily.   Past Medical History:  Diagnosis Date   ADHD    Allergies    Anemia    Anxiety    Arthritis    Asthma    Biliary acute pancreatitis 2010   Colon cancer (HCC) 2016   Transverse colon    Diabetes mellitus without complication (HCC)    Dyspnea    GERD (gastroesophageal reflux disease)    Hypertension    Hypertension    Migraine    Pneumonia     Past Surgical History:  Procedure Laterality Date   ABLATION     BAND HEMORRHOIDECTOMY     BIOPSY  09/02/2021   Procedure: BIOPSY;  Surgeon: Lanelle Bal, DO;  [facility];  Service: Endoscopy;;   BREAST REDUCTION SURGERY Bilateral    CHOLECYSTECTOMY  2010   [place], biliary pancreatitis   COLECTOMY  09/30/2015   distal transverse d/t adenocarcinoma   COLONOSCOPY N/A 06/11/2018   Procedure: COLONOSCOPY;  Surgeon: Corbin Ade, MD;  [facility]: [facility];  Service: Endoscopy;  Laterality: N/A;  1:45pm   COLONOSCOPY  WITH PROPOFOL N/A 04/02/2023   Procedure: COLONOSCOPY WITH PROPOFOL;  Surgeon: Corbin Ade, MD;  [facility];  Service: Endoscopy;  Laterality: N/A;  12:15 pm   DILITATION & CURRETTAGE/HYSTROSCOPY WITH NOVASURE ABLATION N/A 09/10/2018   Procedure: DILATATION & CURETTAGE/HYSTEROSCOPY WITH MINERVA  ENDOMETRIAL ABLATION;  Surgeon: Tilda Burrow, MD;  Location: [facility];  Service: Gynecology;  Laterality: N/A;   ESOPHAGOGASTRODUODENOSCOPY (EGD) WITH PROPOFOL N/A 09/02/2021   Procedure: ESOPHAGOGASTRODUODENOSCOPY (EGD) WITH PROPOFOL;  Surgeon: Lanelle Bal, DO;  [facility];  Service: Endoscopy;  Laterality: N/A;   HEMORRHOID SURGERY N/A 07/04/2023   Procedure: EXTENSIVE HEMORRHOIDECTOMY;  Surgeon: Lucretia Roers, MD;  Location: [facility];  Service: General;  Laterality: N/A;   POLYPECTOMY  06/11/2018   Procedure: POLYPECTOMY;  Surgeon: Corbin Ade, MD;  [facility];  Service: Endoscopy;;  colon   POLYPECTOMY  04/02/2023   Procedure: POLYPECTOMY;  Surgeon: Corbin Ade, MD;  [facility];  Service: Endoscopy;;    Family History  Problem Relation Age of Onset   Hypertension Mother    Diabetes Mellitus II Mother    Heart disease Sister        dx in late 92's   Breast cancer Paternal Grandmother    Breast cancer Paternal Aunt    Colon cancer Neg Hx    Liver disease Neg  Hx     Social:  reports that she has never smoked. She has never used smokeless tobacco. She reports current alcohol use. She reports that she does not use drugs.  Allergies:  Allergies  Allergen Reactions   Lisinopril Cough    Medications: I have reviewed the patient's current medications.   ROS - all of the below systems have been reviewed with the patient and positives are indicated with bold text General: chills, fever or night sweats Eyes: blurry vision or double vision ENT: epistaxis or sore throat Allergy/Immunology: itchy/watery eyes or nasal  congestion Hematologic/Lymphatic: bleeding problems, blood clots or swollen lymph nodes Endocrine: temperature intolerance or unexpected weight changes Breast: new or changing breast lumps or nipple discharge Resp: cough, shortness of breath, or wheezing CV: chest pain or dyspnea on exertion GI: as per HPI GU: dysuria, trouble voiding, or hematuria MSK: joint pain or joint stiffness Neuro: TIA or stroke symptoms Derm: pruritus and skin lesion changes Psych: anxiety and depression  PE Blood pressure (!) 159/96, temperature 99.2 F (37.3 C), temperature source Oral, height 5\' 11"  (1.803 m), weight 129.4 kg, SpO2 96%. Constitutional: NAD; conversant; no deformities Eyes: Moist conjunctiva; no lid lag; anicteric; PERRL Neck: Trachea midline; no thyromegaly Lungs: Normal respiratory effort; no tactile fremitus CV: RRR; no palpable thrills; no pitting edema GI: Abd soft, upper midline incision; no palpable hepatosplenomegaly MSK: Normal gait; no clubbing/cyanosis Psychiatric: Appropriate affect; alert and oriented x3 Lymphatic: No palpable cervical or axillary lymphadenopathy Skin:no rash/lesions/jaundice; 2 resolving insect bites on right forearm each slightly raised with some induration and cellulitis.  No fluctuance  Results for orders placed or performed during the hospital encounter of 10/15/23 (from the past 48 hour(s))  Glucose, capillary     Status: Abnormal   Collection Time: 10/15/23  9:17 AM  Result Value Ref Range   Glucose-Capillary 143 (H) 70 - 99 mg/dL    Comment: Glucose reference range applies only to samples taken after fasting for at least 8 hours.   Comment 1 Notify RN     No results found.  Imaging: reviewed  A/P: Shelly Larson is an 52 y.o. female with  Severe obesity (CMS/HHS-HCC)  History of colon cancer, stage II  Essential hypertension  Prediabetes  Mixed hyperlipidemia  Migraine without aura and with status migrainosus, not  intractable  Vitamin D deficiency  Incisional hernia, without obstruction or gangrene   Insect bites x 2 right forearm with some scant cellulitis and induration   To operating room for laparoscopic Roux-en-Y gastric bypass upper endoscopy  Patient does have an incisional hernia containing several Swiss cheese defects with fat.  rediscussed that when we reduce this herniated fat in order to do the Roux-en-Y gastric bypass they will have to close these defects in order to prevent obstruction.  We discussed that she would be increased risk for hernia recurrence even with her primary repair.  Backup option would be to use absorbable mesh.  Cannot use permanent mesh because of this is not a clean procedure.  Rediscussed that if we encountered dense intra-abdominal scar tissue or if for some reason her anatomy does not favor a Roux-en-Y gastric bypass  if we would convert to a sleeve gastrectomy based on intraoperative findings.  She said she would be fine with that.  Mary Sella. Andrey Campanile, MD, FACS General, Bariatric, & Minimally Invasive Surgery [facility]

## 2023-10-15 NOTE — Discharge Instructions (Signed)

## 2023-10-15 NOTE — Progress Notes (Signed)
PHARMACY CONSULT FOR:  Risk Assessment for Post-Discharge VTE Following Bariatric Surgery  Post-Discharge VTE Risk Assessment: This patient's probability of 30-day post-discharge VTE is increased due to the factors marked: x Sleeve gastrectomy   Liver disorder (transplant, cirrhosis, or nonalcoholic steatohepatitis)   Hx of VTE   Hemorrhage requiring transfusion   GI perforation, leak, or obstruction   ====================================================    Female    Age >/=60 years    BMI >/=50 kg/m2    CHF    Dyspnea at Rest    Paraplegia   x Non-gastric-band surgery    Operation Time >/=3 hr    Return to OR     Length of Stay >/= 3 d   Hypercoagulable condition   Significant venous stasis     Predicted probability of 30-day post-discharge VTE: - 0.16% mild risk of VTE. No discharge VTE prophx recommended   Other patient-specific factors to consider: h/o cancer 2016, h/o dyspnea in PMH   Recommendation for Discharge: No pharmacologic prophylaxis post-discharge    Shelly Larson is a 52 y.o. female who underwent    - 10/21: LAPAROSCOPIC SLEEVE GASTRECTOMY WITH LAPAROSCOPIC LYSIS OF ADHESIONS    Case start: 1109 Case end: 1249   Allergies  Allergen Reactions   Lisinopril Cough    Patient Measurements: Height: 5\' 11"  (180.3 cm) Weight: 129.4 kg (285 lb 3.2 oz) IBW/kg (Calculated) : 70.8 Body mass index is 39.78 kg/m.  No results for input(s): "WBC", "HGB", "HCT", "PLT", "APTT", "CREATININE", "LABCREA", "CREAT24HRUR", "MG", "PHOS", "ALBUMIN", "PROT", "AST", "ALT", "ALKPHOS", "BILITOT", "BILIDIR", "IBILI" in the last 72 hours. Estimated Creatinine Clearance: 126.5 mL/min (by C-G formula based on SCr of 0.67 mg/dL).    Past Medical History:  Diagnosis Date   ADHD    Allergies    Anemia    Anxiety    Arthritis    Asthma    Biliary acute pancreatitis 2010   Colon cancer (HCC) 2016   Transverse colon    Diabetes mellitus without complication (HCC)     Dyspnea    GERD (gastroesophageal reflux disease)    Hypertension    Hypertension    Migraine    Pneumonia      [name] S. Merilynn Finland, PharmD, BCPS Clinical Staff Pharmacist [email or web address] Merilynn Finland, [employer] 10/15/2023,1:51 PM

## 2023-10-15 NOTE — Progress Notes (Signed)
    Name: Shelly Larson                 Patient MRN: [id number] DOA: 10/15/2023   Patient seen in room: 1318  Pt sitting up in recliner, dosing off and on. Pt grimacing with c/o pain and nausea, prrimary RN aware  Vital signs in last 24 hours:    10/15/2023    2:57 PM 10/15/2023    2:45 PM 10/15/2023    2:30 PM  Vitals with BMI  Systolic 116 153 409  Diastolic 68 73 72  Pulse 59 66 64    Intake/Output:   Intake/Output Summary (Last 24 hours) at 10/15/2023 1600 Last data filed at 10/15/2023 1500 Gross per 24 hour  Intake 700 ml  Output 150 ml  Net 550 ml    Discussed QI "Goals for Discharge" document with patient including ambulation in halls, Incentive Spirometry use every hour, and oral care.  Also discussed pain and nausea control.  Enabled or verified head of bed 30 degree alarm activated.  BSTOP education provided including BSTOP information guide, "Guide for Pain Management after your Bariatric Procedure".  Diet progression education provided including "Bariatric Surgery Post-Op Food Plan Phase 1: Liquids".  Questions answered.  Will continue to partner with bedside RN and follow up with patient per protocol.    Thank you,  Lubertha Basque, RN, MSN Bariatric Nurse Coordinator [phone] (office)

## 2023-10-15 NOTE — Anesthesia Procedure Notes (Signed)
Procedure Name: Intubation Date/Time: 10/15/2023 10:48 AM  Performed by: Ludwig Lean, CRNAPre-anesthesia Checklist: Patient identified, Emergency Drugs available, Suction available and Patient being monitored Patient Re-evaluated:Patient Re-evaluated prior to induction Oxygen Delivery Method: Circle system utilized Preoxygenation: Pre-oxygenation with 100% oxygen Induction Type: IV induction Ventilation: Mask ventilation without difficulty Laryngoscope Size: Mac and 4 Grade View: Grade I Tube type: Oral Tube size: 7.5 mm Number of attempts: 1 Airway Equipment and Method: Stylet Placement Confirmation: ETT inserted through vocal cords under direct vision, positive ETCO2 and breath sounds checked- equal and bilateral Secured at: 23 cm Tube secured with: Tape Dental Injury: Teeth and Oropharynx as per pre-operative assessment

## 2023-10-15 NOTE — Op Note (Signed)
10/15/2023 Shelly Larson 1971-06-30 [id number]   PRE-OPERATIVE DIAGNOSIS:   Severe obesity (BMI 40)  History of colon cancer, stage II  Essential hypertension  Prediabetes  Mixed hyperlipidemia  Migraine without aura and with status migrainosus, not intractable  Vitamin D deficiency    POST-OPERATIVE DIAGNOSIS:  same + intra-abdominal adhesions  PROCEDURE:  Procedure(s): LAPAROSCOPIC SLEEVE GASTRECTOMY WITH LAPAROSCOPIC LYSIS OF ADHESIONS 30 MIN UPPER GI ENDOSCOPY Laparoscopic bilateral TAP block  SURGEON:  Surgeon(s): Atilano Ina, MD FACS FASMBS  ASSISTANTS: Feliciana Rossetti MD FACS  ANESTHESIA:   general  DRAINS: none   FINDINGS: Patient had small bowel adhesions to her midline extending below the umbilicus.  The small bowel was essentially adhered to her old fascial closure sutures.   BOUGIE: 40 fr ViSiGi  LOCAL MEDICATIONS USED:   Exparel  EBL: minimal   SPECIMEN:  Source of Specimen:  Greater curvature of stomach  DISPOSITION OF SPECIMEN:  PATHOLOGY  COUNTS:  YES  INDICATION FOR PROCEDURE: This is a very pleasant 52 y.o.-year-old morbidly obese female who has had unsuccessful attempts for sustained weight loss. The patient presents today for a planned laparoscopic Roux-en-Y gastric bypass.  However the patient had had a prior upper midline incision for a transverse colectomy due to a history of colon cancer in 2016.  We had discussed preoperatively that if we came across too much scar tissue that would make her higher risk for enterotomy that we would convert to a sleeve gastrectomy intraoperatively.  The patient agreed with that plan.  . We have discussed the risk and benefits of the procedure extensively preoperatively. Please see my separate notes.  PROCEDURE: After obtaining informed consent and receiving 5000 units of subcutaneous heparin, the patient was brought to the operating room at [facility] and placed supine on the operating room table.  General endotracheal anesthesia was established. Sequential compression devices were placed. A orogastric tube was placed. The patient's abdomen was prepped and draped in the usual standard surgical fashion. The patient received preoperative IV antibiotic. A surgical timeout was performed. ERAS protocol used.  The patient had an old upper midline incision.  Access to the abdomen was achieved using a 5 mm 0 laparoscope thru a 5 mm trocar In the left upper Quadrant 2 fingerbreadths below the left subcostal margin using the Optiview technique. Pneumoperitoneum was smoothly established up to 15 mm of mercury. The laparoscope was advanced and the abdominal cavity was surveilled.  There was a fair amount of small bowel adhered to the midline all the way up to the falciform extending below the umbilicus.    I placed an additional 5 mm trocar in the left lateral abdominal wall and another in the left lower quadrant.  I then started inspecting the small bowel adhesions to the anterior abdominal wall.  There were multiple loops of small bowel tethered in the midline there was one in the left upper quadrant near my optical entry site.  That loop of bowel was not injured.  I was able to take it down with EndoShears.  There was some fat in the upper midline near the falciform that I was able to take down with EndoShears and with harmonic scalpel.  The small bowel was taken down from the upper midline sometimes taking a cuff of peritoneum to prevent any type of enterotomy.  I was able to free the upper midline.  We then inspected the remaining lower abdominal wall and umbilical area.  There was still probably about an  8 long segment of small bowel that appeared to be fairly densely adhered to the midline to her old fascial closure suture.  I felt that the risk of enterotomy and mobilizing more small bowel was too high.  Moreover she had had a remote CT scan that demonstrated a fascial defect in that area so we would have to  deal with some type of incisional hernia as well.  I therefore decided to convert to a sleeve gastrectomy after discussing it with my colleague.  The patient was then placed in reverse Trendelenburg.   A 5 mm trocar was placed slightly above and to the left of the umbilicus under direct visualization.  The [facility] liver retractor was placed under the left lobe of the liver through a 5 mm trocar incision site in the subxiphoid position. A 5 mm trocar was placed in the lateral right upper quadrant along with a 15 mm trocar in the mid right abdomen. A final 5 mm trocar was placed in the lateral LUQ.  All under direct visualization after exparel had been infiltrated in bilateral lateral upper abdominal walls as a TAP block for postoperative pain relief.  The stomach was inspected. It was completely decompressed and the orogastric tube was removed.  The upper abdomen appeared amendable to proceeding with sleeve gastrectomy.  There is no anterior dimple that was obviously visible.  The patient had had previous upper endoscopy as well as upper GI that showed no hiatal hernia.  We identified the pylorus and measured 6 cm proximal to the pylorus and identified an area of where we would start taking down the short gastric vessels. Harmonic scalpel was used to take down the short gastric vessels along the greater curvature of the stomach. We were able to enter the lesser sac. We continued to march along the greater curvature of the stomach taking down the short gastrics. As we approached the gastrosplenic ligament we took care in this area not to injure the spleen. We were able to take down the entire gastrosplenic ligament. We then mobilized the fundus away from the left crus of diaphragm. There were a fair number posterior gastric avascular attachments.  This had to be taken down with harmonic scalpel.  There was also parts of the posterior stomach that had avascular attachments to the anterior body of the  pancreas.  We had to open up this as well in order to accommodate and get the stapler in that area on later.  This left the stomach completely mobilized. No vessels had been taken down along the lesser curvature of the stomach.  We then reidentified the pylorus. A 40Fr ViSiGi was then placed in the oropharynx and advanced down into the stomach and placed in the distal antrum and positioned along the lesser curvature. It was placed under suction which secured the 40Fr ViSiGi in place along the lesser curve. Then using the Ethicon echelon 60 mm stapler with a gold load with ethicon staple line reinforcement (ESLR), I placed a stapler along the antrum approximately 5 cm from the pylorus. The stapler was angled so that there is ample room at the angularis incisura. I then fired the first staple load after inspecting it posteriorly to ensure adequate space both anteriorly and posteriorly. At this point I still was not completely past the angularis so with another green load with ESLR, I placed the stapler in position just inside the prior stapleline. We then rotated the stomach to insure that there was adequate anteriorly as well as  posteriorly. The stapler was then fired.  At this point I started using 60 mm blue load staple cartridges with ESLR. The echelon stapler was then repositioned with a 60 mm blue load with ESLR and we continued to march up along the ViSiGi. My assistant was holding traction along the greater curvature stomach along the cauterized short gastric vessels ensuring that the stomach was symmetrically retracted. Prior to each firing of the staple, we rotated the stomach to ensure that there is adequate stomach left.  As we approached the fundus, I used 60 mm blue cartridge with ESLR aiming  lateral to the GE junction after mobilizing some of the esophageal fat pad.  The sleeve was inspected. There is no evidence of cork screw. The staple line appeared hemostatic. The CRNA inflated the ViSiGi to the  green zone and the upper abdomen was flooded with saline. There were no bubbles. The sleeve was decompressed and the ViSiGi removed.  Pneumoperitoneum was reduced to 8 mmHg so we can inspect for bleeding along the staple line.  My assistant scrubbed out and performed an upper endoscopy. The sleeve easily distended with air and the scope was easily advanced to the pylorus. There is no evidence of internal bleeding or cork screwing. There was no narrowing at the angularis. There is no evidence of bubbles. Please see his operative note for further details. The gastric sleeve was decompressed and the endoscope was removed.  A 15 mm applied Endo Catch bag was placed through the 15 mm trocar.  The greater curvature of the stomach was placed in the specimen bag.  The greater curvature the stomach was grasped with a laparoscopic grasper and removed from the 15 mm trocar site in the specimen bag..  And extracting the specimen through the abdominal wall parts of the specimen tore but the back remained intact and there was no contamination in the incision.  The liver retractor was removed. I then closed the 15 mm trocar site with 1 interrupted 0 Vicryl sutures through the fascia using the PMI suture passer. . The closure was viewed laparoscopically and it was airtight.  I inspected the area where we had lysed adhesions.  There is no evidence of injury to the small bowel.  Remaining Exparel was then infiltrated in the preperitoneal spaces around the trocar sites. Pneumoperitoneum was released. All trocar sites were closed with a 4-0 Monocryl in a subcuticular fashion followed by the application of steri-strips, and bandaids. The patient was extubated and taken to the recovery room in stable condition. All needle, instrument, and sponge counts were correct x2. There are no immediate complications  (0) 60 mm green with ESLR (1) 60 mm gold with ESLR (5) 60 mm blue with ESLR  PLAN OF CARE: Admit to inpatient   PATIENT  DISPOSITION:  PACU - hemodynamically stable.   Delay start of Pharmacological VTE agent (>24hrs) due to surgical blood loss or risk of bleeding:  no  Mary Sella. Andrey Campanile, MD, FACS FASMBS General, Bariatric, & Minimally Invasive Surgery [facility], [place]

## 2023-10-15 NOTE — Transfer of Care (Addendum)
Immediate Anesthesia Transfer of Care Note  Patient: Ivin Booty  Procedure(s) Performed: Procedure(s): LAPAROSCOPIC GASTRIC SLEEVE AND LYSIS OF ADHESIONS WITH UPPER ENDOSCOPY (N/A)  Patient Location: PACU  Anesthesia Type:General  Level of Consciousness: Patient easily awoken, comfortable, responds to stimulation.   Airway & Oxygen Therapy: Patient spontaneously breathing, ventilating well, oxygen via simple oxygen mask.  Post-op Assessment: Report given to PACU RN, vital signs reviewed and stable, moving all extremities.   Post vital signs: Reviewed and stable.  Complications: No apparent anesthesia complications Last Vitals:  Vitals Value Taken Time  BP 110/56 10/15/23 1301  Temp    Pulse 66 10/15/23 1304  Resp 27 10/15/23 1304  SpO2 99 % 10/15/23 1304  Vitals shown include unfiled device data.  Last Pain:  Vitals:   10/15/23 0912  TempSrc:   PainSc: 0-No pain         Complications: No notable events documented.

## 2023-10-15 NOTE — Anesthesia Postprocedure Evaluation (Signed)
Anesthesia Post Note  Patient: Shelly Larson  Procedure(s) Performed: LAPAROSCOPIC GASTRIC SLEEVE AND LYSIS OF ADHESIONS WITH UPPER ENDOSCOPY     Patient location during evaluation: PACU Anesthesia Type: General Level of consciousness: awake Pain management: pain level controlled Vital Signs Assessment: post-procedure vital signs reviewed and stable Respiratory status: spontaneous breathing, nonlabored ventilation and respiratory function stable Cardiovascular status: blood pressure returned to baseline and stable Postop Assessment: no apparent nausea or vomiting Anesthetic complications: no   No notable events documented.  Last Vitals:  Vitals:   10/15/23 1445 10/15/23 1457  BP: (!) 153/73 116/68  Pulse: 66 (!) 59  Resp: (!) 23 18  Temp:  36.7 C  SpO2: 95% 93%    Last Pain:  Vitals:   10/15/23 1457  TempSrc: Oral  PainSc:                  Linton Rump

## 2023-10-15 NOTE — Op Note (Signed)
Preoperative diagnosis: laparoscopic sleeve gastrectomy ? ?Postoperative diagnosis: Same  ? ?Procedure: Upper endoscopy  ? ?Surgeon: Gurney Maxin, M.D. ? ?Anesthesia: Gen.  ? ?Indications for procedure: This patient was undergoing a laparoscopic sleeve gastrectomy.  ? ?Description of procedure: The endoscopy was placed in the mouth and into the oropharynx and under endoscopic vision it was advanced to the esophagogastric junction.  The stomach was insufflated and no bleeding or bubbles were seen.  The GEJ was identified at 40cm from the teeth. No bleeding or leaks were detected. The scope was withdrawn without difficulty.   ? ?Gurney Maxin, M.D. ?General, Bariatric, & Minimally Invasive Surgery ?[facility] ? ? ?

## 2023-10-16 ENCOUNTER — Other Ambulatory Visit (HOSPITAL_COMMUNITY): Payer: Self-pay

## 2023-10-16 ENCOUNTER — Encounter (HOSPITAL_COMMUNITY): Payer: Self-pay | Admitting: General Surgery

## 2023-10-16 LAB — CBC WITH DIFFERENTIAL/PLATELET
Abs Immature Granulocytes: 0.06 10*3/uL (ref 0.00–0.07)
Basophils Absolute: 0 10*3/uL (ref 0.0–0.1)
Basophils Relative: 0 %
Eosinophils Absolute: 0 10*3/uL (ref 0.0–0.5)
Eosinophils Relative: 0 %
HCT: 41.7 % (ref 36.0–46.0)
Hemoglobin: 13.3 g/dL (ref 12.0–15.0)
Immature Granulocytes: 1 %
Lymphocytes Relative: 6 %
Lymphs Abs: 0.8 10*3/uL (ref 0.7–4.0)
MCH: 27.7 pg (ref 26.0–34.0)
MCHC: 31.9 g/dL (ref 30.0–36.0)
MCV: 86.9 fL (ref 80.0–100.0)
Monocytes Absolute: 0.7 10*3/uL (ref 0.1–1.0)
Monocytes Relative: 5 %
Neutro Abs: 11.6 10*3/uL — ABNORMAL HIGH (ref 1.7–7.7)
Neutrophils Relative %: 88 %
Platelets: 188 10*3/uL (ref 150–400)
RBC: 4.8 MIL/uL (ref 3.87–5.11)
RDW: 14.5 % (ref 11.5–15.5)
WBC: 13.1 10*3/uL — ABNORMAL HIGH (ref 4.0–10.5)
nRBC: 0 % (ref 0.0–0.2)

## 2023-10-16 LAB — COMPREHENSIVE METABOLIC PANEL
ALT: 42 U/L (ref 0–44)
AST: 31 U/L (ref 15–41)
Albumin: 3.5 g/dL (ref 3.5–5.0)
Alkaline Phosphatase: 54 U/L (ref 38–126)
Anion gap: 12 (ref 5–15)
BUN: 12 mg/dL (ref 6–20)
CO2: 24 mmol/L (ref 22–32)
Calcium: 9.1 mg/dL (ref 8.9–10.3)
Chloride: 98 mmol/L (ref 98–111)
Creatinine, Ser: 0.9 mg/dL (ref 0.44–1.00)
GFR, Estimated: >60 mL/min (ref >60)
Glucose, Bld: 180 mg/dL — ABNORMAL HIGH (ref 70–99)
Potassium: 3.7 mmol/L (ref 3.5–5.1)
Sodium: 134 mmol/L — ABNORMAL LOW (ref 135–145)
Total Bilirubin: 0.8 mg/dL (ref 0.3–1.2)
Total Protein: 6.9 g/dL (ref 6.5–8.1)

## 2023-10-16 LAB — SURGICAL PATHOLOGY

## 2023-10-16 MED ORDER — OXYCODONE HCL 5 MG PO TABS
5.0000 mg | ORAL_TABLET | Freq: Four times a day (QID) | ORAL | 0 refills | Status: AC | PRN
  Filled 2023-10-16: qty 10, 3d supply, fill #0

## 2023-10-16 MED ORDER — GABAPENTIN 100 MG PO CAPS
100.0000 mg | ORAL_CAPSULE | Freq: Two times a day (BID) | ORAL | 0 refills | Status: DC
  Filled 2023-10-16: qty 10, 5d supply, fill #0

## 2023-10-16 MED ORDER — ACETAMINOPHEN 500 MG PO TABS: 1000.0000 mg | ORAL_TABLET | Freq: Three times a day (TID) | ORAL | Status: AC

## 2023-10-16 MED ORDER — ONDANSETRON HCL 4 MG PO TABS
4.0000 mg | ORAL_TABLET | Freq: Three times a day (TID) | ORAL | 1 refills | Status: DC | PRN
  Filled 2023-10-16: qty 30, 10d supply, fill #0

## 2023-10-16 NOTE — Progress Notes (Signed)
   10/16/23 0943  TOC Brief Assessment  Insurance and Status Reviewed  Patient has primary care physician Yes  Home environment has been reviewed Resides with spouse in an apartment  Prior level of function: Independent at baseline  Prior/Current Home Services No current home services  Social Determinants of Health Reivew SDOH reviewed no interventions necessary  Readmission risk has been reviewed Yes  Transition of care needs no transition of care needs at this time

## 2023-10-16 NOTE — Progress Notes (Signed)
Patient alert and oriented, pain is controlled. Patient is tolerating fluids, advanced to protein shake today, patient is tolerating well. Reviewed Gastric sleeve/bypass discharge instructions with patient and patient is able to articulate understanding. Provided information on BELT program, Support Group, [facility]STOP[facility]D, and [facility] outpatient pharmacy. Communicated general update of patient status to surgeon. All questions answered. 24hr fluid recall is 720 mL per hydration protocol, bariatric nurse coordinator to make follow-up phone call within one week.

## 2023-10-16 NOTE — Plan of Care (Signed)
  Problem: Education: Goal: Knowledge of General Education information will improve Description: Including pain rating scale, medication(s)/side effects and non-pharmacologic comfort measures Outcome: Adequate for Discharge   Problem: Health Behavior/Discharge Planning: Goal: Ability to manage health-related needs will improve Outcome: Adequate for Discharge   Problem: Clinical Measurements: Goal: Ability to maintain clinical measurements within normal limits will improve Outcome: Adequate for Discharge Goal: Will remain free from infection Outcome: Adequate for Discharge Goal: Diagnostic test results will improve Outcome: Adequate for Discharge Goal: Respiratory complications will improve Outcome: Adequate for Discharge Goal: Cardiovascular complication will be avoided Outcome: Adequate for Discharge   Problem: Activity: Goal: Risk for activity intolerance will decrease Outcome: Adequate for Discharge   Problem: Nutrition: Goal: Adequate nutrition will be maintained Outcome: Adequate for Discharge   Problem: Coping: Goal: Level of anxiety will decrease Outcome: Adequate for Discharge   Problem: Elimination: Goal: Will not experience complications related to bowel motility Outcome: Adequate for Discharge Goal: Will not experience complications related to urinary retention Outcome: Adequate for Discharge   Problem: Pain Managment: Goal: General experience of comfort will improve Outcome: Adequate for Discharge   Problem: Safety: Goal: Ability to remain free from injury will improve Outcome: Adequate for Discharge   Problem: Skin Integrity: Goal: Risk for impaired skin integrity will decrease Outcome: Adequate for Discharge   Problem: Education: Goal: Ability to state signs and symptoms to report to health care provider will improve Outcome: Adequate for Discharge Goal: Knowledge of the prescribed self-care regimen will improve Outcome: Adequate for  Discharge Goal: Knowledge of discharge needs will improve Outcome: Adequate for Discharge   Problem: Activity: Goal: Ability to tolerate increased activity will improve Outcome: Adequate for Discharge   Problem: Bowel/Gastric: Goal: Gastrointestinal status for postoperative course will improve Outcome: Adequate for Discharge Goal: Occurrences of nausea will decrease Outcome: Adequate for Discharge   Problem: Coping: Goal: Development of coping mechanisms to deal with changes in body function or appearance will improve Outcome: Adequate for Discharge   Problem: Fluid Volume: Goal: Maintenance of adequate hydration will improve Outcome: Adequate for Discharge   Problem: Nutritional: Goal: Nutritional status will improve Outcome: Adequate for Discharge   Problem: Clinical Measurements: Goal: Will show no signs or symptoms of venous thromboembolism Outcome: Adequate for Discharge Goal: Will remain free from infection Outcome: Adequate for Discharge Goal: Will show no signs of GI Leak Outcome: Adequate for Discharge   Problem: Respiratory: Goal: Will regain and/or maintain adequate ventilation Outcome: Adequate for Discharge   Problem: Pain Management: Goal: Pain level will decrease Outcome: Adequate for Discharge   Problem: Skin Integrity: Goal: Demonstration of wound healing without infection will improve Outcome: Adequate for Discharge   

## 2023-10-17 ENCOUNTER — Other Ambulatory Visit: Payer: Self-pay | Admitting: Physician Assistant

## 2023-10-17 DIAGNOSIS — F410 Panic disorder [episodic paroxysmal anxiety] without agoraphobia: Secondary | ICD-10-CM

## 2023-10-18 NOTE — Discharge Summary (Signed)
Physician Discharge Summary  GREYLYN BURTNESS EXH:[id number] DOB: 1971-09-28 DOA: 10/15/2023  PCP: Bary Leriche, PA-C  Admit date: 10/15/2023 Discharge date: 10/16/2023  Recommendations for Outpatient Follow-up:    Follow-up Information     Gaynelle Adu, MD Follow up on 11/15/2023.   Specialty: General Surgery Why: Please arrive 15 minutes prior to your appointment at [facility] information: [address] [place] [address] [phone]         Gaynelle Adu, MD. Call today.   Specialty: General Surgery Why: Please call today to recieve your second appointment. Contact information: [address] [place] [address] [phone]                Discharge Diagnoses:  Principal Problem:   S/P laparoscopic sleeve gastrectomy Severe obesity (BMI 40)  History of colon cancer, stage II  Essential hypertension  Prediabetes  Mixed hyperlipidemia  Migraine without aura and with status migrainosus, not intractable  Vitamin D deficiency   Surgical Procedure: Laparoscopic Sleeve Gastrectomy with lysis of adhesions, upper endoscopy  Discharge Condition: Good Disposition: Home  Diet recommendation: Postoperative sleeve gastrectomy diet (liquids only)  Filed Weights   10/15/23 0912  Weight: 129.4 kg     Hospital Course:  The patient was admitted for a planned laparoscopic roux en y gastric bypass but due to fair amount of small bowel adhesions to the anterior abdominal wall.  We converted to a sleeve gastrectomy because of risk of enterotomy with additional lysis of adhesions. Please see operative note. Preoperatively the patient was given 5000 units of subcutaneous heparin for DVT prophylaxis. Postoperative prophylactic heparin dosing was started on the evening of postoperative day 0. ERAS protocol was used. On the evening of postoperative day 0, the patient was started on water and ice chips. On postoperative day 1 the  patient had no fever or tachycardia and was tolerating water in their diet was gradually advanced throughout the day. The patient was ambulating without difficulty. Their vital signs are stable without fever or tachycardia. Their hemoglobin had remained stable. The patient had received discharge instructions and counseling. They were deemed stable for discharge and had met discharge criteria  BP (!) 145/81 (BP Location: Right Arm)   Pulse 69   Temp 98.4 F (36.9 C) (Oral)   Resp 16   Ht 5\' 11"  (1.803 m)   Wt 129.4 kg   SpO2 94%   BMI 39.78 kg/m   Gen: alert, NAD, non-toxic appearing Pupils: equal, no scleral icterus Pulm: Lungs clear to auscultation, symmetric chest rise CV: regular rate and rhythm Abd: soft, mild tender, nondistended.  No cellulitis. No incisional hernia Ext: no edema, no calf tenderness Skin: no rash, no jaundice    Discharge Instructions  Discharge Instructions     Ambulate hourly while awake   Complete by: As directed    Call MD for:  difficulty breathing, headache or visual disturbances   Complete by: As directed    Call MD for:  persistant dizziness or light-headedness   Complete by: As directed    Call MD for:  persistant nausea and vomiting   Complete by: As directed    Call MD for:  redness, tenderness, or signs of infection (pain, swelling, redness, odor or green/yellow discharge around incision site)   Complete by: As directed    Call MD for:  severe uncontrolled pain   Complete by: As directed    Call MD for:  temperature >101 F  Complete by: As directed    Diet bariatric full liquid   Complete by: As directed    Discharge instructions   Complete by: As directed    See bariatric discharge instructions   Incentive spirometry   Complete by: As directed    Perform hourly while awake      Allergies as of 10/16/2023       Reactions   Lisinopril Cough        Medication List     TAKE these medications    acetaminophen 500 MG  tablet Commonly known as: TYLENOL Take 2 tablets (1,000 mg total) by mouth every 8 (eight) hours for 5 days.   albuterol 108 (90 Base) MCG/ACT inhaler Commonly known as: VENTOLIN HFA Inhale 2 puffs into the lungs every 4 (four) hours as needed for wheezing or shortness of breath.   amitriptyline 25 MG tablet Commonly known as: ELAVIL Take 3 tablets (75 mg total) by mouth at bedtime.   amLODipine 10 MG tablet Commonly known as: NORVASC TAKE 1 TABLET BY MOUTH EVERY DAY   amphetamine-dextroamphetamine 15 MG tablet Commonly known as: Adderall Take 1 tab po around 2 pm What changed:  how much to take how to take this when to take this reasons to take this additional instructions   amphetamine-dextroamphetamine 30 MG 24 hr capsule Commonly known as: Adderall XR Take 1 capsule (30 mg total) by mouth every morning. What changed: Another medication with the same name was changed. Make sure you understand how and when to take each.   clonazePAM 0.5 MG tablet Commonly known as: KLONOPIN TAKE 1 TABLET BY MOUTH TWICE A DAY AS NEEDED FOR ANXIETY   gabapentin 100 MG capsule Commonly known as: NEURONTIN Take 1 capsule (100 mg total) by mouth every 12 (twelve) hours for 5 days.   hydrocortisone 2.5 % rectal cream Commonly known as: ANUSOL-HC Place 1 Application rectally 2 (two) times daily. What changed:  when to take this reasons to take this   linaclotide 290 MCG Caps capsule Commonly known as: LINZESS Take 1 capsule (290 mcg total) by mouth daily before breakfast.   losartan-hydrochlorothiazide 100-25 MG tablet Commonly known as: HYZAAR TAKE 1 TABLET BY MOUTH EVERY DAY   metoprolol succinate 50 MG 24 hr tablet Commonly known as: TOPROL-XL Take 50 mg by mouth every morning.   Nurtec 75 MG Tbdp Generic drug: Rimegepant Sulfate Take 75 mg by mouth daily as needed.   omeprazole 40 MG capsule Commonly known as: PRILOSEC Take 1 capsule (40 mg total) by mouth daily. What  changed: when to take this   ondansetron 4 MG tablet Commonly known as: Zofran Take 1 tablet (4 mg total) by mouth every 8 (eight) hours as needed.   oxyCODONE 5 MG immediate release tablet Commonly known as: Oxy IR/ROXICODONE Take 1 tablet (5 mg total) by mouth every 6 (six) hours as needed for breakthrough pain or severe pain (pain score 7-10). What changed:  how much to take when to take this   promethazine 12.5 MG tablet Commonly known as: PHENERGAN Take 1 tablet (12.5 mg total) by mouth every 6 (six) hours as needed for nausea or vomiting.        Follow-up Information     Gaynelle Adu, MD Follow up on 11/15/2023.   Specialty: General Surgery Why: Please arrive 15 minutes prior to your appointment at [facility] information: [address] [place] [address] [phone]         Andrey Campanile,  Minerva Areola, MD. Call today.   Specialty: General Surgery Why: Please call today to recieve your second appointment. Contact information: [address] [place] [address] [phone]                  The results of significant diagnostics from this hospitalization (including imaging, microbiology, ancillary and laboratory) are listed below for reference.    Significant Diagnostic Studies: No results found.  Labs: Basic Metabolic Panel: Recent Labs  Lab 10/16/23 0506  NA 134*  K 3.7  CL 98  CO2 24  GLUCOSE 180*  BUN 12  CREATININE 0.90  CALCIUM 9.1   Liver Function Tests: Recent Labs  Lab 10/16/23 0506  AST 31  ALT 42  ALKPHOS 54  BILITOT 0.8  PROT 6.9  ALBUMIN 3.5    CBC: Recent Labs  Lab 10/15/23 1543 10/16/23 0506  WBC  --  13.1*  NEUTROABS  --  11.6*  HGB 13.8 13.3  HCT 43.1 41.7  MCV  --  86.9  PLT  --  188    CBG: Recent Labs  Lab 10/15/23 0917 10/15/23 1148 10/15/23 1306  GLUCAP 143* 131* 154*    Principal Problem:   S/P laparoscopic sleeve gastrectomy   Time coordinating discharge: 20  min  Signed:  Atilano Ina, MD [facility] [phone] 10/18/2023, 8:27 AM

## 2023-10-18 NOTE — Telephone Encounter (Signed)
Last OV: 08/02/23  Next OV: none  Last Filled: 08/21/2023  Quantity: 30 w/ 1 refill

## 2023-10-22 ENCOUNTER — Telehealth (HOSPITAL_COMMUNITY): Payer: Self-pay | Admitting: *Deleted

## 2023-10-22 ENCOUNTER — Other Ambulatory Visit (HOSPITAL_COMMUNITY): Payer: Self-pay

## 2023-10-22 NOTE — Telephone Encounter (Signed)
1. Tell me about your pain and pain management?     Pt c/o right lower abdominal incisional pain with movement and exertion.   Pt states that s/he has been taking  Discussed with patient to try and splint his/her abdomen with changing positions to assist with the discomfort. Encouraged pt to try getting into a comfortable position and remaining upright, especially after eating/drinking, and/or contact [facility] if still concerned.      2. Let's talk about fluid intake. How much total fluid are you taking in?   Pt states that s/he is getting in at least 40oz to 45oz of fluid including protein shakes, bottled water, and other fluids  Pt instructed to assess status and suggestions daily utilizing Hydration Action Plan on discharge folder and to call [facility] if in the "red zone".     3. How much protein have you taken in the last day?     Pt states she is meeting the goal of 60g of protein each day with the protein shakes and yogurt   4. Have you had nausea? Tell me about when you have experienced nausea and what you did to help?   Pt denies nausea.   5. Has the frequency or color changed with your urine?   Pt states that s/he is urinating "fine" with no changes in frequency or urgency.   6. Tell me what your incisions look like?   "Incisions look fine". Pt denies a fever, chills. Pt states incisions are not swollen, open, or draining. Pt encouraged to call [facility] if incisions change.    7. Have you been passing gas? BM?   Pt states that they have had a BM. Pt is taking Linzess per prescribed Instructions". Pt to call surgeon's office if not able to have BM with medication.    8. If a problem or question were to arise who would you call? Do you know contact numbers for [facility], [facility], and [facility]?   Pt knows to call [facility] for surgical, [facility] for nutrition, and [facility] for non-urgent questions or concerns. Pt denies dehydration symptoms. Pt can describe s/sx of dehydration.   9. How has the walking going?    Pt states s/he is walking around and able to be active without difficulty.   10. Are you still using your incentive spirometer? If so, how often?   Pt states that s/he is doing the I.S. Pt encouraged to use incentive spirometer, at least 10x every hour while awake until s/he sees the surgeon.   11. How are your vitamins and calcium going? How are you taking them?     Pt states that s/he is taking his/her supplements and vitamins without difficulty.

## 2023-10-23 ENCOUNTER — Encounter (HOSPITAL_COMMUNITY): Payer: Self-pay | Admitting: General Surgery

## 2023-10-23 NOTE — Addendum Note (Signed)
Addendum  created 10/23/23 1609 by Linton Rump, MD   Intraprocedure Event edited, Intraprocedure Staff edited

## 2023-10-29 ENCOUNTER — Ambulatory Visit: Payer: Managed Care, Other (non HMO) | Admitting: Internal Medicine

## 2023-10-29 ENCOUNTER — Emergency Department (HOSPITAL_BASED_OUTPATIENT_CLINIC_OR_DEPARTMENT_OTHER)
Admission: EM | Admit: 2023-10-29 | Discharge: 2023-10-29 | Disposition: A | Discharge status: Home / Self Care | Payer: Managed Care, Other (non HMO) | Attending: Emergency Medicine | Admitting: Emergency Medicine

## 2023-10-29 ENCOUNTER — Other Ambulatory Visit: Payer: Self-pay

## 2023-10-29 ENCOUNTER — Encounter: Payer: Self-pay | Admitting: Internal Medicine

## 2023-10-29 ENCOUNTER — Encounter (HOSPITAL_BASED_OUTPATIENT_CLINIC_OR_DEPARTMENT_OTHER): Payer: Self-pay | Admitting: Emergency Medicine

## 2023-10-29 VITALS — BP 130/82 | HR 75 | Temp 97.8°F | Ht 71.0 in | Wt 275.0 lb

## 2023-10-29 DIAGNOSIS — L989 Disorder of the skin and subcutaneous tissue, unspecified: Secondary | ICD-10-CM | POA: Diagnosis not present

## 2023-10-29 DIAGNOSIS — T63301D Toxic effect of unspecified spider venom, accidental (unintentional), subsequent encounter: Secondary | ICD-10-CM

## 2023-10-29 DIAGNOSIS — E119 Type 2 diabetes mellitus without complications: Secondary | ICD-10-CM | POA: Diagnosis not present

## 2023-10-29 DIAGNOSIS — T63391D Toxic effect of venom of other spider, accidental (unintentional), subsequent encounter: Secondary | ICD-10-CM | POA: Diagnosis present

## 2023-10-29 DIAGNOSIS — J45909 Unspecified asthma, uncomplicated: Secondary | ICD-10-CM | POA: Insufficient documentation

## 2023-10-29 DIAGNOSIS — Z79899 Other long term (current) drug therapy: Secondary | ICD-10-CM | POA: Diagnosis not present

## 2023-10-29 DIAGNOSIS — I10 Essential (primary) hypertension: Secondary | ICD-10-CM | POA: Diagnosis not present

## 2023-10-29 LAB — POCT URINALYSIS DIPSTICK
Bilirubin, UA: NEGATIVE
Blood, UA: NEGATIVE
Glucose, UA: NEGATIVE
Ketones, UA: NEGATIVE
Nitrite, UA: NEGATIVE
Protein, UA: NEGATIVE
Spec Grav, UA: 1.015 (ref 1.010–1.025)
Urobilinogen, UA: 0.2 U/dL
pH, UA: 5.5 (ref 5.0–8.0)

## 2023-10-29 LAB — CBC WITH DIFFERENTIAL/PLATELET
Absolute Lymphocytes: 1525 {cells}/uL (ref 850–3900)
Absolute Monocytes: 370 {cells}/uL (ref 200–950)
Basophils Absolute: 40 {cells}/uL (ref 0–200)
Basophils Relative: 0.6 %
Eosinophils Absolute: 416 {cells}/uL (ref 15–500)
Eosinophils Relative: 6.3 %
HCT: 40.9 % (ref 35.0–45.0)
Hemoglobin: 13.4 g/dL (ref 11.7–15.5)
MCH: 27.7 pg (ref 27.0–33.0)
MCHC: 32.8 g/dL (ref 32.0–36.0)
MCV: 84.7 fL (ref 80.0–100.0)
MPV: 13.3 fL — ABNORMAL HIGH (ref 7.5–12.5)
Monocytes Relative: 5.6 %
Neutro Abs: 4250 {cells}/uL (ref 1500–7800)
Neutrophils Relative %: 64.4 %
Platelets: 190 10*3/uL (ref 140–400)
RBC: 4.83 10*6/uL (ref 3.80–5.10)
RDW: 14.3 % (ref 11.0–15.0)
Total Lymphocyte: 23.1 %
WBC: 6.6 10*3/uL (ref 3.8–10.8)

## 2023-10-29 LAB — URIC ACID: Uric Acid, Serum: 7 mg/dL (ref 2.4–7.0)

## 2023-10-29 LAB — PHOSPHORUS: Phosphorus: 3 mg/dL (ref 2.3–4.6)

## 2023-10-29 LAB — APTT: aPTT: 31.9 s (ref 25.4–36.8)

## 2023-10-29 LAB — CK: Total CK: 64 U/L (ref 7–177)

## 2023-10-29 LAB — PROTIME-INR
INR: 1.1 {ratio} — ABNORMAL HIGH (ref 0.8–1.0)
Prothrombin Time: 12.1 s (ref 9.6–13.1)

## 2023-10-29 MED ORDER — DIPHENHYDRAMINE HCL 25 MG PO CAPS
25.0000 mg | ORAL_CAPSULE | Freq: Once | ORAL | Status: AC
  Administered 2023-10-29: 25 mg via ORAL
  Filled 2023-10-29: qty 1

## 2023-10-29 MED ORDER — SULFAMETHOXAZOLE-TRIMETHOPRIM 800-160 MG PO TABS: 1.0000 | ORAL_TABLET | Freq: Two times a day (BID) | ORAL | 0 refills | Status: DC

## 2023-10-29 NOTE — Progress Notes (Signed)
NEUROLOGY FOLLOW UP OFFICE NOTE  Shelly Larson [id number]  Subjective:  Shelly Larson is a 52 y.o. year old right-handed female with a medical history of migraine, HTN, DM, hep B, ADHD, colon cancer, anxiety, OA, plantar fasciitis, gastric sleeve who we last saw on 05/03/23 for chronic migraine without aura.  To briefly review: Patient has had headaches for a lot of her life. They went away when she was in her 51s. About 4-5 years ago, she began having headaches again. She was on metoprolol, other BP meds, and topirimate for prevention. She was on sumatriptan for abortive therapy. This was working until recently. She was getting 3-4 headaches per month at that time. The sumatriptan would make her headaches tolerable. Patient stopped taking topirimate about 1 year ago.    Over the last 2-3 months, she has had worsening headaches. She is having more frequent headaches. PCP switched patient rizatriptan 10 mg PRN. This does not help much. She has not identified a clear trigger.    The headaches patient has now are similar to headaches she had when she was younger. Most of the time the pain is behind both eyes. She describes a "banging, piercing sensation." She endorses photophobia, phonophobia, nausea, and vomiting. Movement and activity makes it worse. She also has aura (flashing lights, vision changes, floaters, dizziness like sensation). The headaches last about 1-1.5 days before dying down to a tolerable nagging pain. At the worst, the pain is 9/10. She has about 6-8 headaches per month. She always gets at least 1 every week, maybe 2-3. There is no positional component per patient. Her headaches slowly build to maximal intensity.   Patient has been having night sweats for at least 6-7 months and hot flashes for about a year. Patient had an ablation 2-3 years ago. She occasionally has occasional spotting that she calls her fake cycle. She gets a migraine when this happens. She is not sleeping well,  maybe sleeping 3-4 hours a night because of hot flashes, sweats, or headaches. She is not hormonal therapy.   She takes OTC about 3 doses per week and triptan 1-2 times per week (total of 5 doses per week, or 20 total doses per month).   Patient occasionally goes to the ED for acute treatment, about 6 times in the last year, due to headache that she cannot control. She last went to the ED around 06/2022.   Past NSAIDS/analgesics:  tylenol, excedrin, ibuprofen Past abortive triptans:  sumatriptan (no longer helping) Past muscle relaxants:  flexeril Past anti-emetic:  phenergan Past antihypertensive medications:  propranolol, metoprolol Past antidepressant medications:  elavil (helped a lot) Past anticonvulsant medications:  topirimate (thinks it may have helped) Past vitamins/Herbal/Supplements:  none Past antihistamines/decongestants:  bendryl   Current NSAIDS/analgesics:  tylenol, excedrin, ibuprofen Current triptans:  rizatriptan (not helping) Current ergotamine:  none Current anti-emetic:  none Current muscle relaxants:  none Current Antihypertensive medications:  metoprolol Current Antidepressant medications:  none Current Anticonvulsant medications:  none Current anti-CGRP:  none Current Vitamins/Herbal/Supplements:  none Current Antihistamines/Decongestants:  bendryl Hormone/birth control:  none   Caffeine:  occasional Alcohol:  occasional wine Smoker:  no Diet:  normal american diet Exercise:  walks 3-4 times per week (about 1 mile); gets a lot of steps in daily Depression:  none; Anxiety:  gets panic attacks and anxiety (on adderall) Other pain:  gets a pain from the neck; getting worse of late Family history of headache:  sister had migraines   01/18/23: Nurtec  was approved on 10/31/22. Patient was unaware and did not ever get this medication.   She was diagnosed with COVID on 11/01/22. Her headaches have been worse since COVID. She had very severe headaches during  COVID. She describes the headaches similar to prior, pain behind eyes, banging, piercing sensation with photophobia, phonophobia, and nausea and vomiting. The headaches are more likely with weather changes, cold weather, or pressure changes. She is having about 5 headaches per week currently (~20 days of headache per month compared to ~16 days of headache on 10/27/22).   Patient started Elavil 25 mg qhs. She also had rizatriptan but it did not work. She takes phenergan for nausea.   Vit D was low at 8.47, so supplementation was recommended. She started this as recommended.  05/03/23: Patient started Nurtec. It has been very helpful. Patient has had about 4 headaches per month, about 7-8 over the last couple of months.   On Monday (04/30/23) patient started having a headache. She initially took tylenol. She then took Nurtec that got rid of the headache for a while. The headache then came back. She thought the headache was going away, but it didn't and started coming back. She took Nurtec again, but still has a headache. This headache is similar to known headaches. This is the first headache that has been debilitating of late.   Patient endorses some increased stress due to husband's health. There has been no other recent changes to health or medications.  Most recent Assessment and Plan (05/03/23): This is Shelly Larson, a 52 y.o. female with: Chronic migraines without aura. She has previously failed propranolol, metoprolol, and topiramate for migraine prevention. She has failed tylenol, Excedrin, ibuprofen, sumatriptan, and rizatriptan for migraine rescue. Vit D deficiency     Plan: Medrol dose pack to break current headache cyle Migraine prevention:  Increase Elavil to 75 mg qhs Migraine rescue:  Nurtec 75 mg PRN Limit use of pain relievers to no more than 2 days out of week to prevent risk of rebound or medication-overuse headache. Keep headache diary Continue vit D 1000 IU daily  Since their  last visit: She was having 2-3 headaches prior to recent surgery. Patient had gastric sleeve surgery in 09/2023. Since the surgery, she has had an increase in frequency and intensity in headaches. She is now having 2-3 headaches per week. The headaches are the same as previous (no change in character). She had one so bad that she went almost went to the ED last week.  She takes Elavil 75 mg at bedtime. Nurtec is also not working as well since surgery. It was working within an hour previously, now taking multiple hours to work. At last visit, I prescribed a medrol dose pack. She is not sure if it worked, but her headaches did get better with some time.  Her neck pain is improved from prior.   MEDICATIONS:  Outpatient Encounter Medications as of 11/07/2023  Medication Sig   albuterol (VENTOLIN HFA) 108 (90 Base) MCG/ACT inhaler Inhale 2 puffs into the lungs every 4 (four) hours as needed for wheezing or shortness of breath.    amitriptyline (ELAVIL) 25 MG tablet Take 3 tablets (75 mg total) by mouth at bedtime.   amLODipine (NORVASC) 10 MG tablet TAKE 1 TABLET BY MOUTH EVERY DAY   amphetamine-dextroamphetamine (ADDERALL XR) 30 MG 24 hr capsule Take 1 capsule (30 mg total) by mouth every morning.   amphetamine-dextroamphetamine (ADDERALL) 15 MG tablet Take 1 tab po around 2  pm (Patient taking differently: Take 15 mg by mouth daily as needed (in the afternoon as needed for focus/attention.).)   clonazePAM (KLONOPIN) 0.5 MG tablet TAKE 1 TABLET BY MOUTH TWICE A DAY AS NEEDED FOR ANXIETY   Erenumab-aooe (AIMOVIG) 140 MG/ML SOAJ Inject 140 mg into the skin every 28 (twenty-eight) days.   gabapentin (NEURONTIN) 100 MG capsule Take 1 capsule (100 mg total) by mouth every 12 (twelve) hours for 5 days.   hydrocortisone (ANUSOL-HC) 2.5 % rectal cream Place 1 Application rectally 2 (two) times daily. (Patient taking differently: Place 1 Application rectally 2 (two) times daily as needed for hemorrhoids or anal  itching.)   LINZESS 290 MCG CAPS capsule TAKE 1 CAPSULE BY MOUTH DAILY BEFORE BREAKFAST.   losartan-hydrochlorothiazide (HYZAAR) 100-25 MG tablet TAKE 1 TABLET BY MOUTH EVERY DAY   methylPREDNISolone (MEDROL DOSEPAK) 4 MG TBPK tablet Take 6 tablets for 1 day, then 5 tablets for 1 day, then 4 tablets for 1 day, then 3 tablets for 1 day, then 2 tablets for 1 day, then 1 tablet for 1 day, then STOP   metoprolol succinate (TOPROL-XL) 50 MG 24 hr tablet Take 50 mg by mouth every morning.    omeprazole (PRILOSEC) 40 MG capsule Take 1 capsule (40 mg total) by mouth daily. (Patient taking differently: Take 40 mg by mouth every evening.)   Rimegepant Sulfate (NURTEC) 75 MG TBDP Take 1 tablet (75 mg total) by mouth daily as needed.   sulfamethoxazole-trimethoprim (BACTRIM DS) 800-160 MG tablet Take 1 tablet by mouth 2 (two) times daily.   Zavegepant HCl (ZAVZPRET) 10 MG/ACT SOLN Place 10 mg into the nose as needed (for migraine).   [DISCONTINUED] ondansetron (ZOFRAN) 4 MG tablet Take 1 tablet (4 mg total) by mouth every 8 (eight) hours as needed.   [DISCONTINUED] promethazine (PHENERGAN) 12.5 MG tablet Take 1 tablet (12.5 mg total) by mouth every 6 (six) hours as needed for nausea or vomiting.   oxyCODONE (OXY IR/ROXICODONE) 5 MG immediate release tablet Take 1 tablet (5 mg total) by mouth every 6 (six) hours as needed for breakthrough pain or severe pain (pain score 7-10). (Patient not taking: Reported on 11/07/2023)   promethazine (PHENERGAN) 12.5 MG tablet Take 1 tablet (12.5 mg total) by mouth every 6 (six) hours as needed for nausea or vomiting.   [DISCONTINUED] linaclotide (LINZESS) 290 MCG CAPS capsule Take 1 capsule (290 mcg total) by mouth daily before breakfast.   [DISCONTINUED] Rimegepant Sulfate (NURTEC) 75 MG TBDP Take 75 mg by mouth daily as needed.   No facility-administered encounter medications on file as of 11/07/2023.    PAST MEDICAL HISTORY: Past Medical History:  Diagnosis Date    ADHD    Allergies    Anemia    Anxiety    Arthritis    Asthma    Biliary acute pancreatitis 2010   Colon cancer (HCC) 2016   Transverse colon    Diabetes mellitus without complication (HCC)    Dyspnea    GERD (gastroesophageal reflux disease)    Hypertension    Hypertension    Migraine    Pneumonia     PAST SURGICAL HISTORY: Past Surgical History:  Procedure Laterality Date   ABLATION     BAND HEMORRHOIDECTOMY     BIOPSY  09/02/2021   Procedure: BIOPSY;  Surgeon: Lanelle Bal, DO;  Location: [facility];  Service: Endoscopy;;   BREAST REDUCTION SURGERY Bilateral    CHOLECYSTECTOMY  2010   [place], biliary pancreatitis  COLECTOMY  09/30/2015   distal transverse d/t adenocarcinoma   COLONOSCOPY N/A 06/11/2018   Procedure: COLONOSCOPY;  Surgeon: Corbin Ade, MD;  Location: [facility];  Service: Endoscopy;  Laterality: N/A;  1:45pm   COLONOSCOPY WITH PROPOFOL N/A 04/02/2023   Procedure: COLONOSCOPY WITH PROPOFOL;  Surgeon: Corbin Ade, MD;  Location: [facility];  Service: Endoscopy;  Laterality: N/A;  12:15 pm   DILITATION & CURRETTAGE/HYSTROSCOPY WITH NOVASURE ABLATION N/A 09/10/2018   Procedure: DILATATION & CURETTAGE/HYSTEROSCOPY WITH MINERVA  ENDOMETRIAL ABLATION;  Surgeon: Tilda Burrow, MD;  Location: [facility];  Service: Gynecology;  Laterality: N/A;   ESOPHAGOGASTRODUODENOSCOPY (EGD) WITH PROPOFOL N/A 09/02/2021   Procedure: ESOPHAGOGASTRODUODENOSCOPY (EGD) WITH PROPOFOL;  Surgeon: Lanelle Bal, DO;  Location: [facility];  Service: Endoscopy;  Laterality: N/A;   GASTRIC ROUX-EN-Y N/A 10/15/2023   Procedure: LAPAROSCOPIC GASTRIC SLEEVE AND LYSIS OF ADHESIONS WITH UPPER ENDOSCOPY;  Surgeon: Gaynelle Adu, MD;  Location: [facility];  Service: General;  Laterality: N/A;   HEMORRHOID SURGERY N/A 07/04/2023   Procedure: EXTENSIVE HEMORRHOIDECTOMY;  Surgeon: Lucretia Roers, MD;  Location: [facility];  Service: General;  Laterality: N/A;   POLYPECTOMY   06/11/2018   Procedure: POLYPECTOMY;  Surgeon: Corbin Ade, MD;  Location: [facility];  Service: Endoscopy;;  colon   POLYPECTOMY  04/02/2023   Procedure: POLYPECTOMY;  Surgeon: Corbin Ade, MD;  Location: [facility];  Service: Endoscopy;;    ALLERGIES: Allergies  Allergen Reactions   Lisinopril Cough    FAMILY HISTORY: Family History  Problem Relation Age of Onset   Hypertension Mother    Diabetes Mellitus II Mother    Heart disease Sister        dx in late 68's   Breast cancer Paternal Grandmother    Breast cancer Paternal Aunt    Colon cancer Neg Hx    Liver disease Neg Hx     SOCIAL HISTORY: Social History   Tobacco Use   Smoking status: Never   Smokeless tobacco: Never  Vaping Use   Vaping status: Never Used  Substance Use Topics   Alcohol use: Yes    Comment: rare   Drug use: No   Social History   Social History Narrative   Are you right handed or left handed? right   Are you currently employed ? 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 And niece   What type of home do you live in: 1 story or 2 story? One     Caffeine 1 soda a day      Objective:  Vital Signs:  BP 117/75   Pulse 72   Ht 5\' 4"  (1.626 m)   Wt 270 lb (122.5 kg)   SpO2 97%   BMI 46.35 kg/m   General: No acute distress.  Patient appears well-groomed.   Head:  Normocephalic/atraumatic Eyes:  fundi examined, disc margins clear. No obvious papilledema Neck: supple Lungs: Non-labored breathing on room air   Neurological Exam: Mental status: alert and oriented, speech fluent and not dysarthric, language intact.  Cranial nerves: CN I: not tested CN II: pupils equal, round and reactive to light, visual fields intact CN III, IV, VI:  full range of motion, no nystagmus, no ptosis CN V: facial sensation intact. CN VII: upper and lower face symmetric CN VIII: hearing intact CN IX, X: uvula midline CN XI:  sternocleidomastoid  and trapezius muscles intact CN XII: tongue midline  Bulk & Tone: normal, no fasciculations. Motor:  muscle strength 5/5 throughout Deep Tendon Reflexes:  2+ throughout.   Sensation:  Light touch sensation intact. Finger to nose testing:  Without dysmetria.   Gait:  Normal station and stride.  Romberg negative.   Labs and Imaging review: New results: 10/16/23: CMP significant for glucose of 180 CBC w/ diff significant for WBC 13.1 (neutrophilic predominance)  HbA1c (09/05/23): 7.3  Previously reviewed results: HbA1c (01/09/22): 6.3   03/28/23: BMP significant for K of 2.9. CBC unremarkable   Vit D (10/27/22): 8.47 B12 (10/27/22): 392   Recent Labs           Lab Results  Component Value Date    HGBA1C 6.3 08/17/2022      Recent Labs           Lab Results  Component Value Date    TSH 1.42 08/17/2022      Recent Labs[] Expand by Default           Lab Results  Component Value Date    ESRSEDRATE 25 (H) 08/31/2021      Normal or unremarkable: CBC, CMP   Iron studies: iron 81, TIBC 364, sat ratio 22.3, ferritin 39.7  Assessment/Plan:  This is Shelly Larson, a 52 y.o. female with: Chronic migraines without aura. She has previously failed propranolol, metoprolol, and topiramate for migraine prevention. She has failed tylenol, Excedrin, ibuprofen, sumatriptan, and rizatriptan for migraine rescue. Patient is now not responding well to Nurtec since gastric sleeve surgery. She is also not responding as well to Elavil for prevention. Her gastric sleeve surgery may have put increased stress on her body increasing her migraine frequency and intensity, but now absorption of medications is a question due to the surgery. For this reason, I will order an injectable for prevention and nasal spray for rescue to avoid any issues with GI absorption. Vit D deficiency     Plan: Medrol dose pak to try to break current headache cycle Migraine prevention:  Continue  Elavil to 75 mg at bedtime; Will order Aimovig 140 mg every 28 days and attempt to get approved by insurance. I will likely slowly taper off Elavil when patient is able to start Aimovig. Migraine rescue:  Continue Nurtec 75 mg PRN; Will order Zavzpret 10 mg nasal spray PRN and attempt to get approved. She will continue Nurtec until this is approved then stop Nurtec. Limit use of pain relievers to no more than 2 days out of week to prevent risk of rebound or medication-overuse headache. Keep headache diary Continue vit D 1000 IU daily  -Blood work: B1, B12, TSH  Return to clinic in 3 months  Total time spent reviewing records, interview, history/exam, documentation, and coordination of care on day of encounter:  40 min  Jacquelyne Balint, MD

## 2023-10-29 NOTE — ED Triage Notes (Signed)
Pt caox4, ambulatory, NAD reports approx 6 wks ago she woke up to multiple spider bites on her R and L arm. Pt was seen at [facility] since but was told by other healthcare providers that she needed further eval since they are not appearing to improve and are still itching. Pt denies pain at present. Denies fevers.

## 2023-10-29 NOTE — Progress Notes (Signed)
[facility]: [phone]   -- Medical Office Visit --  Patient:  Shelly Larson      Age: 52 y.o.       Sex:  female  Date:   10/29/2023 Today's Healthcare Provider: Lula Olszewski, MD  =======================================================================================   Assessment & Plan Skin lesion Rapidly growing, necrotic, non-healing skin ulcers on both arms raise concern for causes beyond suspected brown recluse spider bites, including vasculitis or pyoderma gangrenosum. We will order comprehensive blood work to evaluate for systemic causes and potential distant damage. An urgent referral to dermatology for further evaluation and possible biopsy is necessary. We will switch antibiotics from Doxycycline to Bactrim for suspected secondary infection and continue using MRSA cream.  Assessment and Plan    Skin Ulcers  ASSESSMENT:  Primary Diagnosis: - Progressive Necrotic Skin Ulcers (L98.4)   - Bilateral forearm involvement   - Failed initial therapy   - Associated systemic symptoms   - Concerning for deeper tissue involvement  Differential Diagnoses: 1. Pyoderma gangrenosum    - Post-surgical trigger    - Multiple progressive ulcerative lesions    - Failure to respond to initial therapy 2. Vasculitis    - Bilateral involvement    - Systemic symptoms 3. Spider bite with secondary infection    - Initial presentation    - Treatment-resistant course  PLAN:  1. Diagnostic Evaluation:    - Comprehensive metabolic panel    - CBC with differential    - LDH, CK, and uric acid levels    - Coagulation studies (PT/INR, PTT)    - Urinalysis    - Reticulocyte count    - Tissue pathology review  2. Treatment Modifications:    - Discontinue doxycycline    - Start Bactrim DS 800-160mg  PO BID    - Continue wound care with careful monitoring  3. Referrals:    - Urgent dermatology consultation    - Consider rheumatology evaluation pending lab  results  4. Follow-up:    - Schedule return visit in one week if unable to secure immediate dermatology appointment    - Sooner if systemic symptoms worsen or new lesions develop  5. Patient Education:    - Discussed diagnostic uncertainty    - Reviewed medication changes    - Explained need for comprehensive evaluation    - Emphasized importance of close monitoring  Follow-up If she is unable to get in with dermatology, we plan to see her again next week. We will continue to monitor the progression of skin ulcers and systemic symptoms.      Diagnoses and all orders for this visit: Skin lesion -     Pathologist smear review -     Reticulocytes; Future -     Lactate dehydrogenase; Future -     Phosphorus -     Uric acid -     CK (Creatine Kinase) -     POCT Urinalysis Dipstick -     Protime-INR; Future -     PTT -     sulfamethoxazole-trimethoprim (BACTRIM DS) 800-160 MG tablet; Take 1 tablet by mouth 2 (two) times daily. -     Ambulatory referral to Dermatology -     CBC with Differential/Platelet; Future -     Reticulocytes; Future -     Lactate dehydrogenase; Future  Recommended follow-up: No follow-ups on file. Future Appointments  Date Time Provider Department Center  10/30/2023  3:15 PM [facility] POST[facility]OP [facility] NDM  11/07/2023  10:30 AM Antony Madura, MD [facility] None  01/15/2024 10:00 AM Beulah Gandy, RD [facility] NDM  Patient Care Team: Allwardt, Milus Mallick as PCP - General (Physician Assistant) Jodelle Red, MD as PCP - Cardiology (Cardiology) Jena Gauss Gerrit Friends, MD as Consulting Physician (Gastroenterology)    SUBJECTIVE: 52 y.o. female who has Colon cancer [facility]); Change in bowel function; Prolapsed internal hemorrhoids; Rectal bleeding; H/O colon cancer, stage II; Prediabetes; Morbid obesity (HCC); Essential hypertension; Mixed hyperlipidemia; Menorrhagia with regular cycle; Abnormal uterine bleeding (AUB); History of endometrial ablation;  Abnormal liver enzymes; Jaundice; Abdominal pain; Nausea; Asthma; Type 2 diabetes mellitus (HCC); LFT elevation; Acute hepatitis; Acute hepatitis B virus infection; AKI (acute kidney injury) (HCC); Hepatocellular injury; 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; LFTs abnormal; Migraine without aura and with status migrainosus, not intractable; Difficulty sleeping; Anxiety; Absolute anemia; Adult ADHD; Bilateral impacted cerumen; Panic disorder (episodic paroxysmal anxiety); Menopausal and perimenopausal disorder; Anal or rectal pain; and S/P laparoscopic sleeve gastrectomy on their problem list.. Main reasons for visit/main concerns/chief complaint: Follow-up [name] to ED today for a spider bite. Happened about six weeks ago. Very itchy with soreness at times. Left dark spots.)   ----------------------------------------------------------------------------------------------------------  AI-Extracted: Discussed the use of AI scribe software for clinical note transcription with the patient, who gave verbal consent to proceed.    HISTORY OF PRESENT ILLNESS: Patient is a 52 year old female presenting for follow-up evaluation of progressive skin lesions on bilateral forearms. Initially treated two months ago for suspected insect bites, the lesions have evolved significantly despite prescribed treatment with doxycycline and topical mupirocin. The lesions now present as well-demarcated, purplish ulcerative lesions with surrounding erythema and induration.  The patient reports new onset of systemic symptoms including: - Nausea - Dark-colored urine - Generalized muscle aches - Progressive enlargement of skin lesions - Increased tenderness at lesion sites  Current lesions demonstrate: - Right forearm: Two lesions measuring approximately 12mm in diameter - Left forearm: Single lesion measuring 7mm in diameter - All lesions exhibit raised, hyperkeratotic borders with central purplish  discoloration  Treatment History: - Previous course of doxycycline and topical MRSA cream without improvement - Recent gastric sleeve bypass surgery - Recent labs at [facility] showing elevated WBC count and hyponatremia  Pertinent Negatives: - No recent international travel - No foot or ankle ulcers - No pregnancy possibility - No improvement with initial antibiotic therapy  The patient expresses significant concern about possible deep tissue involvement, as suggested by her surgeon, and desires further diagnostic evaluation. She reports having met her insurance deductible and is agreeable to comprehensive testing including possible biopsy.   Note that patient  has a past medical history of ADHD, Allergies, Anemia, Anxiety, Arthritis, Asthma, Biliary acute pancreatitis (2010), Colon cancer (HCC) (2016), Diabetes mellitus without complication (HCC), Dyspnea, GERD (gastroesophageal reflux disease), Hypertension, Hypertension, Migraine, and Pneumonia.  Problem list overviews that were updated at today's visit:No problems updated.  Med reconciliation: Current Outpatient Medications on File Prior to Visit  Medication Sig   albuterol (VENTOLIN HFA) 108 (90 Base) MCG/ACT inhaler Inhale 2 puffs into the lungs every 4 (four) hours as needed for wheezing or shortness of breath.    amitriptyline (ELAVIL) 25 MG tablet Take 3 tablets (75 mg total) by mouth at bedtime.   amLODipine (NORVASC) 10 MG tablet TAKE 1 TABLET BY MOUTH EVERY DAY   amphetamine-dextroamphetamine (ADDERALL XR) 30 MG 24 hr capsule Take 1 capsule (30 mg total) by mouth every morning.   amphetamine-dextroamphetamine (ADDERALL) 15 MG  tablet Take 1 tab po around 2 pm (Patient taking differently: Take 15 mg by mouth daily as needed (in the afternoon as needed for focus/attention.).)   clonazePAM (KLONOPIN) 0.5 MG tablet TAKE 1 TABLET BY MOUTH TWICE A DAY AS NEEDED FOR ANXIETY   gabapentin (NEURONTIN) 100 MG capsule Take 1 capsule (100  mg total) by mouth every 12 (twelve) hours for 5 days.   hydrocortisone (ANUSOL-HC) 2.5 % rectal cream Place 1 Application rectally 2 (two) times daily. (Patient taking differently: Place 1 Application rectally 2 (two) times daily as needed for hemorrhoids or anal itching.)   linaclotide (LINZESS) 290 MCG CAPS capsule Take 1 capsule (290 mcg total) by mouth daily before breakfast.   losartan-hydrochlorothiazide (HYZAAR) 100-25 MG tablet TAKE 1 TABLET BY MOUTH EVERY DAY   metoprolol succinate (TOPROL-XL) 50 MG 24 hr tablet Take 50 mg by mouth every morning.    omeprazole (PRILOSEC) 40 MG capsule Take 1 capsule (40 mg total) by mouth daily. (Patient taking differently: Take 40 mg by mouth every evening.)   ondansetron (ZOFRAN) 4 MG tablet Take 1 tablet (4 mg total) by mouth every 8 (eight) hours as needed.   oxyCODONE (OXY IR/ROXICODONE) 5 MG immediate release tablet Take 1 tablet (5 mg total) by mouth every 6 (six) hours as needed for breakthrough pain or severe pain (pain score 7-10).   promethazine (PHENERGAN) 12.5 MG tablet Take 1 tablet (12.5 mg total) by mouth every 6 (six) hours as needed for nausea or vomiting.   Rimegepant Sulfate (NURTEC) 75 MG TBDP Take 75 mg by mouth daily as needed.   No current facility-administered medications on file prior to visit.  There are no discontinued medications.   Objective   Physical Exam     10/29/2023    1:16 PM 10/29/2023   10:24 AM 10/29/2023   10:23 AM  Vitals with BMI  Height 5\' 11"   5\' 11"   Weight 275 lbs 275 lbs 11 oz   BMI 38.37 38.47   Systolic 130    Diastolic 82    Pulse 75     Wt Readings from Last 10 Encounters:  10/29/23 275 lb (124.7 kg)  10/29/23 275 lb 11.2 oz (125.1 kg)  10/15/23 285 lb 3.2 oz (129.4 kg)  10/03/23 286 lb (129.7 kg)  09/05/23 285 lb 9.6 oz (129.5 kg)  08/23/23 286 lb (129.7 kg)  08/02/23 282 lb 12.8 oz (128.3 kg)  07/31/23 282 lb (127.9 kg)  07/17/23 289 lb (131.1 kg)  07/16/23 288 lb (130.6 kg)   Vital  signs reviewed.  Nursing notes reviewed. Weight trend reviewed. Abnormalities and Problem-Specific physical exam findings:  Photographs Taken 10/29/2023 :      General Appearance:  No acute distress appreciable.   Well-groomed, healthy-appearing female.  Well proportioned with no abnormal fat distribution.  Good muscle tone. Pulmonary:  Normal work of breathing at rest, no respiratory distress apparent. SpO2: 96 %  Musculoskeletal: All extremities are intact.  Neurological:  Awake, alert, oriented, and engaged.  No obvious focal neurological deficits or cognitive impairments.  Sensorium seems unclouded.   Speech is clear and coherent with logical content. Psychiatric:  Appropriate mood, pleasant and cooperative demeanor, thoughtful and engaged during the exam  Results   LABS CBC: Leukocytosis (10/22/2023) CMP: Hyponatremia (10/22/2023)        Results for orders placed or performed in visit on 10/29/23  Phosphorus  Result Value Ref Range   Phosphorus 3.0 2.3 - 4.6 mg/dL  Uric acid  Result Value Ref Range   Uric Acid, Serum 7.0 2.4 - 7.0 mg/dL  CK (Creatine Kinase)  Result Value Ref Range   Total CK 64 7 - 177 U/L  PTT  Result Value Ref Range   aPTT 31.9 25.4 - 36.8 SEC  Protime-INR  Result Value Ref Range   INR 1.1 (H) 0.8 - 1.0 ratio   Prothrombin Time 12.1 9.6 - 13.1 sec  POCT Urinalysis Dipstick  Result Value Ref Range   Color, UA yellow    Clarity, UA cloudy    Glucose, UA Negative Negative   Bilirubin, UA Negative    Ketones, UA Negative    Spec Grav, UA 1.015 1.010 - 1.025   Blood, UA Negative    pH, UA 5.5 5.0 - 8.0   Protein, UA Negative Negative   Urobilinogen, UA 0.2 0.2 or 1.0 E.U./dL   Nitrite, UA Negative    Leukocytes, UA Trace (A) Negative   Appearance     Odor      Office Visit on 10/29/2023  Component Date Value   Phosphorus 10/29/2023 3.0    Uric Acid, Serum 10/29/2023 7.0    Total CK 10/29/2023 64    Color, UA 10/29/2023 yellow    Clarity,  UA 10/29/2023 cloudy    Glucose, UA 10/29/2023 Negative    Bilirubin, UA 10/29/2023 Negative    Ketones, UA 10/29/2023 Negative    Spec Grav, UA 10/29/2023 1.015    Blood, UA 10/29/2023 Negative    pH, UA 10/29/2023 5.5    Protein, UA 10/29/2023 Negative    Urobilinogen, UA 10/29/2023 0.2    Nitrite, UA 10/29/2023 Negative    Leukocytes, UA 10/29/2023 Trace (A)    aPTT 10/29/2023 31.9    INR 10/29/2023 1.1 (H)    Prothrombin Time 10/29/2023 12.1   Admission on 10/15/2023, Discharged on 10/16/2023  Component Date Value   Glucose-Capillary 10/15/2023 143 (H)    Comment 1 10/15/2023 Notify RN    Glucose-Capillary 10/15/2023 131 (H)    SURGICAL PATHOLOGY 10/15/2023                     Value:SURGICAL PATHOLOGY CASE: [id number] PATIENT: Quitman Livings Surgical Pathology Report     Clinical History: Morbid obesity (crm)     FINAL MICROSCOPIC DIAGNOSIS:  A. STOMACH, SLEEVE RESECTION: - Gross diagnosis only: Portion of unremarkable stomach.  GROSS DESCRIPTION:  Specimen: Received fresh a portion of stomach with staple line at 1 end Size: 26 x 4.5 x 2.1 cm Serosa: Pink-tan and smooth Mucosa/Wall: Tan, glistening, and rugated without distinct lesions.  The wall has a thickness of 0.7 cm. The specimen is for gross examination only and no sections are submitted to histology. (KW, 10/15/2023)   Final Diagnosis performed by Jimmy Picket, MD.   Electronically signed 10/16/2023 Technical component performed at [facility], [address], [place], [place] [address].  Professional component performed at [facility], [address], [place], [place] [address].  Immunohistochemistry Technical component (if applica                         ble) was performed at [facility]. [address], [place], [place] [address].   IMMUNOHISTOCHEMISTRY DISCLAIMER (if applicable): Some of these immunohistochemical stains may  have been developed and the performance characteristics determine by [facility]. Some may not have been cleared or approved by the U.S. Food and Drug  Administration. The FDA has determined that such clearance or approval is not necessary. This test is used for clinical purposes. It should not be regarded as investigational or for research. This laboratory is certified under the Clinical Laboratory Improvement Amendments of 1988 (CLIA-88) as qualified to perform high complexity clinical laboratory testing.  The controls stained appropriately.   IHC stains are performed on formalin fixed, paraffin embedded tissue using a 3,3"diaminobenzidine (DAB) chromogen and Leica Bond Autostainer System. The staining intensity of the nucleus is score manually and is reporte                         d as the percentage of tumor cell nuclei demonstrating specific nuclear staining. The specimens are fixed in 10% Neutral Formalin for at least 6 hours and up to 72hrs. These tests are validated on decalcified tissue. Results should be interpreted with caution given the possibility of false negative results on decalcified specimens. Antibody Clones are as follows ER-clone [id number], PR-clone 16, Ki67- clone MM1. Some of these immunohistochemical stains may have been developed and the performance characteristics determined by [facility].    Glucose-Capillary 10/15/2023 154 (H)    Hemoglobin 10/15/2023 13.8    HCT 10/15/2023 43.1    WBC 10/16/2023 13.1 (H)    RBC 10/16/2023 4.80    Hemoglobin 10/16/2023 13.3    HCT 10/16/2023 41.7    MCV 10/16/2023 86.9    MCH 10/16/2023 27.7    MCHC 10/16/2023 31.9    RDW 10/16/2023 14.5    Platelets 10/16/2023 188    nRBC 10/16/2023 0.0    Neutrophils Relative % 10/16/2023 88    Neutro Abs 10/16/2023 11.6 (H)    Lymphocytes Relative 10/16/2023 6    Lymphs Abs 10/16/2023 0.8    Monocytes Relative 10/16/2023 5    Monocytes Absolute 10/16/2023 0.7     Eosinophils Relative 10/16/2023 0    Eosinophils Absolute 10/16/2023 0.0    Basophils Relative 10/16/2023 0    Basophils Absolute 10/16/2023 0.0    Immature Granulocytes 10/16/2023 1    Abs Immature Granulocytes 10/16/2023 0.06    Sodium 10/16/2023 134 (L)    Potassium 10/16/2023 3.7    Chloride 10/16/2023 98    CO2 10/16/2023 24    Glucose, Bld 10/16/2023 180 (H)    BUN 10/16/2023 12    Creatinine, Ser 10/16/2023 0.90    Calcium 10/16/2023 9.1    Total Protein 10/16/2023 6.9    Albumin 10/16/2023 3.5    AST 10/16/2023 31    ALT 10/16/2023 42    Alkaline Phosphatase 10/16/2023 54    Total Bilirubin 10/16/2023 0.8    GFR, Estimated 10/16/2023 >60    Anion gap 10/16/2023 12   Hospital Outpatient Visit on 10/03/2023  Component Date Value   Sodium 10/03/2023 138    Potassium 10/03/2023 3.2 (L)    Chloride 10/03/2023 105    CO2 10/03/2023 26    Glucose, Bld 10/03/2023 93    BUN 10/03/2023 8    Creatinine, Ser 10/03/2023 0.67    Calcium 10/03/2023 8.8 (L)    Total Protein 10/03/2023 7.0    Albumin 10/03/2023 3.7    AST 10/03/2023 13 (L)    ALT 10/03/2023 16    Alkaline Phosphatase 10/03/2023 59    Total Bilirubin 10/03/2023 0.6    GFR, Estimated 10/03/2023 >60    Anion gap 10/03/2023 7    WBC 10/03/2023 6.0    RBC 10/03/2023 5.04  Hemoglobin 10/03/2023 13.9    HCT 10/03/2023 44.8    MCV 10/03/2023 88.9    MCH 10/03/2023 27.6    MCHC 10/03/2023 31.0    RDW 10/03/2023 14.0    Platelets 10/03/2023 172    nRBC 10/03/2023 0.0    Glucose-Capillary 10/03/2023 101 (H)    ABO/RH(D) 10/03/2023 A POS    Antibody Screen 10/03/2023 NEG    Sample Expiration 10/03/2023 10/17/2023,2359    Extend sample reason 10/03/2023                     Value:NO TRANSFUSIONS OR PREGNANCY IN THE PAST 3 MONTHS Performed at [facility], [address], [place], [place] [address]   Hospital Outpatient Visit on 09/05/2023  Component Date Value   Hgb A1c MFr Bld 09/05/2023 7.3  (H)    Mean Plasma Glucose 09/05/2023 163    WBC 09/05/2023 6.2    RBC 09/05/2023 4.90    Hemoglobin 09/05/2023 13.6    HCT 09/05/2023 43.1    MCV 09/05/2023 88.0    MCH 09/05/2023 27.8    MCHC 09/05/2023 31.6    RDW 09/05/2023 13.9    Platelets 09/05/2023 189    nRBC 09/05/2023 0.0    Neutrophils Relative % 09/05/2023 66    Neutro Abs 09/05/2023 4.1    Lymphocytes Relative 09/05/2023 22    Lymphs Abs 09/05/2023 1.4    Monocytes Relative 09/05/2023 7    Monocytes Absolute 09/05/2023 0.4    Eosinophils Relative 09/05/2023 4    Eosinophils Absolute 09/05/2023 0.3    Basophils Relative 09/05/2023 1    Basophils Absolute 09/05/2023 0.0    Immature Granulocytes 09/05/2023 0    Abs Immature Granulocytes 09/05/2023 0.02    ABO/RH(D) 09/05/2023 A POS    Antibody Screen 09/05/2023 NEG    Sample Expiration 09/05/2023 09/19/2023,2359    Extend sample reason 09/05/2023                     Value:NO TRANSFUSIONS OR PREGNANCY IN THE PAST 3 MONTHS Performed at [facility], [address], [place], [place] [address]    Sodium 09/05/2023 134 (L)    Potassium 09/05/2023 3.2 (L)    Chloride 09/05/2023 102    CO2 09/05/2023 24    Glucose, Bld 09/05/2023 132 (H)    BUN 09/05/2023 11    Creatinine, Ser 09/05/2023 0.74    Calcium 09/05/2023 8.6 (L)    Total Protein 09/05/2023 7.1    Albumin 09/05/2023 3.7    AST 09/05/2023 16    ALT 09/05/2023 18    Alkaline Phosphatase 09/05/2023 59    Total Bilirubin 09/05/2023 0.6    GFR, Estimated 09/05/2023 >60    Anion gap 09/05/2023 8    Glucose-Capillary 09/05/2023 146 (H)   Office Visit on 07/17/2023  Component Date Value   WBC 07/23/2023 5.7    RBC 07/23/2023 4.84    Hemoglobin 07/23/2023 13.7    Hematocrit 07/23/2023 43.2    MCV 07/23/2023 89    MCH 07/23/2023 28.3    MCHC 07/23/2023 31.7    RDW 07/23/2023 14.3    Platelets 07/23/2023 190    Neutrophils 07/23/2023 64    Lymphs 07/23/2023 23    Monocytes 07/23/2023 7     Eos 07/23/2023 5    Basos 07/23/2023 1    Neutrophils Absolute 07/23/2023 3.6    Lymphocytes Absolute 07/23/2023 1.3    Monocytes Absolute 07/23/2023 0.4    EOS (  ABSOLUTE) 07/23/2023 0.3    Basophils Absolute 07/23/2023 0.0    Immature Granulocytes 07/23/2023 0    Immature Grans (Abs) 07/23/2023 0.0   Admission on 07/16/2023, Discharged on 07/16/2023  Component Date Value   WBC 07/16/2023 8.2    RBC 07/16/2023 4.68    Hemoglobin 07/16/2023 13.2    HCT 07/16/2023 40.6    MCV 07/16/2023 86.8    MCH 07/16/2023 28.2    MCHC 07/16/2023 32.5    RDW 07/16/2023 14.6    Platelets 07/16/2023 162    nRBC 07/16/2023 0.0    Neutrophils Relative % 07/16/2023 73    Neutro Abs 07/16/2023 6.0    Lymphocytes Relative 07/16/2023 18    Lymphs Abs 07/16/2023 1.5    Monocytes Relative 07/16/2023 6    Monocytes Absolute 07/16/2023 0.5    Eosinophils Relative 07/16/2023 2    Eosinophils Absolute 07/16/2023 0.1    Basophils Relative 07/16/2023 0    Basophils Absolute 07/16/2023 0.0    Immature Granulocytes 07/16/2023 1    Abs Immature Granulocytes 07/16/2023 0.04    Sodium 07/16/2023 137    Potassium 07/16/2023 3.3 (L)    Chloride 07/16/2023 103    CO2 07/16/2023 26    Glucose, Bld 07/16/2023 109 (H)    BUN 07/16/2023 11    Creatinine, Ser 07/16/2023 0.67    Calcium 07/16/2023 9.3    GFR, Estimated 07/16/2023 >60    Anion gap 07/16/2023 8    hCG, Beta Chain, Quant, S 07/16/2023 <1    Hemoglobin 07/16/2023 12.6    HCT 07/16/2023 38.8   Admission on 07/10/2023, Discharged on 07/10/2023  Component Date Value   Sodium 07/10/2023 133 (L)    Potassium 07/10/2023 4.0    Chloride 07/10/2023 103    CO2 07/10/2023 23    Glucose, Bld 07/10/2023 116 (H)    BUN 07/10/2023 11    Creatinine, Ser 07/10/2023 0.84    Calcium 07/10/2023 8.8 (L)    GFR, Estimated 07/10/2023 >60    Anion gap 07/10/2023 7    WBC 07/10/2023 5.5    RBC 07/10/2023 5.12 (H)    Hemoglobin 07/10/2023 14.4    HCT  07/10/2023 44.6    MCV 07/10/2023 87.1    MCH 07/10/2023 28.1    MCHC 07/10/2023 32.3    RDW 07/10/2023 14.4    Platelets 07/10/2023 158    nRBC 07/10/2023 0.0    Neutrophils Relative % 07/10/2023 60    Neutro Abs 07/10/2023 3.3    Lymphocytes Relative 07/10/2023 26    Lymphs Abs 07/10/2023 1.5    Monocytes Relative 07/10/2023 7    Monocytes Absolute 07/10/2023 0.4    Eosinophils Relative 07/10/2023 5    Eosinophils Absolute 07/10/2023 0.3    Basophils Relative 07/10/2023 1    Basophils Absolute 07/10/2023 0.0    WBC Morphology 07/10/2023 REACTIVE LYMPHOCTES PRESENT    RBC Morphology 07/10/2023 MORPHOLOGY UNREMARKABLE    Smear Review 07/10/2023 MORPHOLOGY UNREMARKABLE    Abs Immature Granulocytes 07/10/2023 0.04   Admission on 07/04/2023, Discharged on 07/04/2023  Component Date Value   WBC 07/04/2023 4.9    RBC 07/04/2023 4.93    Hemoglobin 07/04/2023 14.0    HCT 07/04/2023 43.3    MCV 07/04/2023 87.8    MCH 07/04/2023 28.4    MCHC 07/04/2023 32.3    RDW 07/04/2023 14.2    Platelets 07/04/2023 146 (L)    nRBC 07/04/2023 0.0    Sodium 07/04/2023 135    Potassium 07/04/2023 3.8  Chloride 07/04/2023 101    CO2 07/04/2023 26    Glucose, Bld 07/04/2023 143 (H)    BUN 07/04/2023 11    Creatinine, Ser 07/04/2023 0.75    Calcium 07/04/2023 8.5 (L)    Total Protein 07/04/2023 6.8    Albumin 07/04/2023 3.6    AST 07/04/2023 175 (H)    ALT 07/04/2023 79 (H)    Alkaline Phosphatase 07/04/2023 65    Total Bilirubin 07/04/2023 0.8    GFR, Estimated 07/04/2023 >60    Anion gap 07/04/2023 8    Preg Test, Ur 07/04/2023 NEGATIVE    SURGICAL PATHOLOGY 07/04/2023                     Value:SURGICAL PATHOLOGY CASE: [id number] [phone] PATIENT: Quitman Livings Surgical Pathology Report     Clinical History: Prolapsed grade IV hemorrhoids (las)     FINAL MICROSCOPIC DIAGNOSIS:  A. RIGHT ANTERIOR HEMORRHOID, HEMORRHOIDECTOMY: Benign external hemorrhoid with thrombus  B. LEFT  LATERAL HEMORRHOID, HEMORRHOIDECTOMY: Benign external hemorrhoid with thrombus  C. RIGHT POSTERIOR HEMORRHOID, HEMORRHOIDECTOMY: Benign external hemorrhoid with thrombus   GROSS DESCRIPTION:  Specimen A: Received in formalin is a 4.9 x 3.5 x 1.6 cm polypoid tissue partially covered by pink-brown wrinkled skin and pink-gray smooth to wrinkled mucosa.  Cut surfaces are edematous and focally congested. Representative sections are submitted in 1 block.  Specimen B: Received in formalin is a 3.4 x 2.4 x 1.2 cm polypoid tissue partially covered by pink-brown wrinkled skin and pink to dark red-purple smooth mucosa.  Cut surfaces are edematous and congested. Representative sections are                          submitted 1 block.  Specimen C: Received in formalin is a 2.5 x 2 x 1.3 cm polypoid tissue partially covered by pink-brown wrinkled skin and pink to dark red-purple smooth mucosa.  Cut surfaces are edematous and focally congested.  A representative section is submitted in 1 block.  SW 07/05/2023   Final Diagnosis performed by Jerene Bears, MD.   Electronically signed 07/06/2023 Technical component performed at [facility], [address], [place], [place] [address].  Professional component performed at [employer]. [facility], [address], [place], [place] [address].  Immunohistochemistry Technical component (if applicable) was performed at [facility]. [address], [address], [place], [place] [address].   IMMUNOHISTOCHEMISTRY DISCLAIMER (if applicable): Some of these immunohistochemical stains may have been developed and the performance characteristics determine by [facility]. Some may not have been                          cleared or approved by the U.S. Food and Drug Administration. The FDA has determined that such clearance or approval is not necessary. This test is used for clinical purposes. It should  not be regarded as investigational or for research. This laboratory is certified under the Clinical Laboratory Improvement Amendments of 1988 (CLIA-88) as qualified to perform high complexity clinical laboratory testing.  The controls stained appropriately.   IHC stains are performed on formalin fixed, paraffin embedded tissue using a 3,3"diaminobenzidine (DAB) chromogen and Leica Bond Autostainer System. The staining intensity of the nucleus is score manually and is reported as the percentage of tumor cell nuclei demonstrating specific nuclear staining. The specimens are fixed in 10% Neutral Formalin for at least 6 hours and up to  72hrs. These tests are validated on decalcified tissue. Results should be interpreted with caution given the possibility of false negative results on decalcifie                         d specimens. Antibody Clones are as follows ER-clone 57F, PR-clone 16, Ki67- clone MM1. Some of these immunohistochemical stains may have been developed and the performance characteristics determined by [facility].   Office Visit on 05/08/2023  Component Date Value   Hemoglobin A1C 05/08/2023 6.7 (A)   Appointment on 05/07/2023  Component Date Value   S' Lateral 05/07/2023 2.90    Area-P 1/2 05/07/2023 3.27    P 1/2 time 05/07/2023 451    Est EF 05/07/2023 60 - 65%   There may be more visits with results that are not included.         Additional Info: This encounter employed real-time, collaborative documentation. The patient actively reviewed and updated their medical record on a shared screen, ensuring transparency and facilitating joint problem-solving for the problem list, overview, and plan. This approach promotes accurate, informed care. The treatment plan was discussed and reviewed in detail, including medication safety, potential side effects, and all patient questions. We confirmed understanding and comfort with the plan. Follow-up instructions were  established, including contacting the office for any concerns, returning if symptoms worsen, persist, or new symptoms develop, and precautions for potential emergency department visits.

## 2023-10-29 NOTE — Patient Instructions (Addendum)
AFTER VISIT SUMMARY  Today's Visit Summary: You were seen today for evaluation of progressive skin ulcers on both arms. Due to ongoing concerns and new symptoms, we are changing your treatment plan and ordering additional tests.  New Medications: - Bactrim DS (sulfamethoxazole-trimethoprim)   - Take 1 tablet twice daily with food   - Complete entire course as prescribed   - Stop if you develop rash or severe diarrhea  Important Tests Ordered: - Multiple blood tests to check for underlying conditions - Possible skin biopsy through dermatology - Urine test  Follow-Up Instructions: 1. Continue wound care as discussed 2. Keep dermatology appointment when scheduled 3. Return to clinic in one week if unable to see dermatologist 4. Monitor wounds for:    - Increased size or depth    - New lesions    - Increased pain or redness    - Fever  Seek Immediate Medical Attention If You Develop: - Fever above 101.44F - Severe pain - Spreading redness - Yellow skin or eyes - Difficulty breathing - Severe nausea or vomiting  Questions or Concerns: Contact our office during business hours or seek emergency care if symptoms worsen significantly after hours.  Next Steps: Our office will contact you with: 1. Dermatology appointment details 2. Lab test results 3. Any additional recommendations based on test results    VISIT SUMMARY:  During today's visit, we discussed your rapidly growing skin lesions and associated symptoms, including nausea and dark urine. We reviewed your recent medical history, including your gastric sleeve bypass surgery and recent blood work results. We also discussed your concerns about the potential causes of your symptoms and the need for further testing.  YOUR PLAN:  -SKIN ULCERS: Your skin ulcers are growing quickly and not healing, which raises concerns about other possible causes like vasculitis or pyoderma gangrenosum. We will do comprehensive blood work to  check for systemic causes and any potential damage elsewhere in your body. You will be referred urgently to a dermatologist for further evaluation and a possible biopsy. We are changing your antibiotic from Doxycycline to Bactrim to better address the suspected secondary infection and continuing the use of MRSA cream.  -SYSTEMIC SYMPTOMS: Your nausea and dark urine might be related to your skin ulcers or your recent gastric sleeve bypass surgery. We will include liver function tests in your blood work to check for any liver issues. Please watch for any yellowing of your eyes, which could indicate liver damage.  INSTRUCTIONS:  If you cannot get an appointment with dermatology, please schedule a follow-up visit with [place] next week. We will continue to monitor your skin ulcers and systemic symptoms.It was a pleasure seeing you today! Your health and satisfaction are our top priorities.  Glenetta Hew, MD  Your Providers PCP: Bethanie Dicker,  [phone]) Referring Provider: Allwardt, Milus Mallick,  [phone]) Care Team Provider: Corbin Ade, MD,  [phone]) Care Team Provider: Jodelle Red, MD,  [phone])     NEXT STEPS: [x]  Early Intervention: Schedule sooner appointment, call our on-call services, or go to emergency room if there is any significant Increase in pain or discomfort New or worsening symptoms Sudden or severe changes in your health [x]  Flexible Follow-Up: We recommend a No follow-ups on file. for optimal routine care. This allows for progress monitoring and treatment adjustments. [x]  Preventive Care: Schedule your annual preventive care visit! It's typically covered by insurance and helps identify potential health issues early. [x]  Lab & X-ray Appointments: Incomplete tests  scheduled today, or call to schedule. X-rays: [facility] (M-F, 8:30am-noon or 1pm-5pm). [x]  Medical Information Release: Sign a release form at front  desk to obtain relevant medical information we don't have.  MAKING THE MOST OF OUR FOCUSED 20 MINUTE APPOINTMENTS: [x]   Clearly state your top concerns at the beginning of the visit to focus our discussion [x]   If you anticipate you will need more time, please inform the front desk during scheduling - we can book multiple appointments in the same week. [x]   If you have transportation problems- use our convenient video appointments or ask about transportation support. [x]   We can get down to business faster if you use MyChart to update information before the visit and submit non-urgent questions before your visit. Thank you for taking the time to provide details through MyChart.  Let our nurse know and she can import this information into your encounter documents.  Arrival and Wait Times: [x]   Arriving on time ensures that everyone receives prompt attention. [x]   Early morning (8a) and afternoon (1p) appointments tend to have shortest wait times. [x]   Unfortunately, we cannot delay appointments for late arrivals or hold slots during phone calls.  Getting Answers and Following Up [x]   Simple Questions & Concerns: For quick questions or basic follow-up after your visit, reach [place] at [phone] or MyChart messaging. [x]   Complex Concerns: If your concern is more complex, scheduling an appointment might be best. Discuss this with the staff to find the most suitable option. [x]   Lab & Imaging Results: We'll contact you directly if results are abnormal or you don't use MyChart. Most normal results will be on MyChart within 2-3 business days, with a review message from Dr. Jon Billings. Haven't heard back in 2 weeks? Need results sooner? Contact us at [phone]. [x]   Referrals: Our referral coordinator will manage specialist referrals. The specialist's office should contact you within 2 weeks to schedule an appointment. Call us if you haven't heard from them after 2 weeks.  Staying Connected [x]    MyChart: Activate your MyChart for the fastest way to access results and message [place]. See the last page of this paperwork for instructions on how to activate.  Bring to Your Next Appointment [x]   Medications: Please bring all your medication bottles to your next appointment to ensure we have an accurate record of your prescriptions. [x]   Health Diaries: If you're monitoring any health conditions at home, keeping a diary of your readings can be very helpful for discussions at your next appointment.  Billing [x]   X-ray & Lab Orders: These are billed by separate companies. Contact the invoicing company directly for questions or concerns. [x]   Visit Charges: Discuss any billing inquiries with our administrative services team.  Your Satisfaction Matters [x]   Share Your Experience: We strive for your satisfaction! If you have any complaints, or preferably compliments, please let Dr. Jon Billings know directly or contact our Practice Administrators, Edwena Felty or [employer], by asking at the front desk.   Reviewing Your Records [x]   Review this early draft of your clinical encounter notes below and the final encounter summary tomorrow on MyChart after its been completed.  All orders placed so far are visible here: Skin lesion -     Pathologist smear review -     Reticulocytes; Future -     Lactate dehydrogenase; Future -     Phosphorus -     Uric acid -     CK -  POCT urinalysis dipstick -     Protime-INR; Future -     APTT -     Sulfamethoxazole-Trimethoprim; Take 1 tablet by mouth 2 (two) times daily.  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 Pyoderma gangrenosum    - Post-surgical trigger    - Multiple progressive ulcerative lesions    - Failure to respond to initial therapy    - Associated with systemic symptoms  2. Vasculitis    - Bilateral involvement    - Systemic symptoms    - Pattern of distribution  3. Infectious Etiologies    - Treatment-resistant MRSA    - Deep fungal infection    - Atypical mycobacterial infection    - Secondary bacterial infection  4. Post-surgical Complications    - Nutritional deficiency following gastric bypass    - Protein-energy malnutrition affecting wound healing    - Micronutrient deficiencies  5. Autoimmune Conditions    - Early presentation of systemic lupus erythematosus    - Polyarteritis nodosa    - Other connective tissue diseases  6. Vascular Disorders    - Superficial thrombophlebitis    - Antiphospholipid syndrome    - Peripheral arterial disease  7. 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 Autoimmune workup:    - ANA    - ANCA    - Complement levels    - RF and anti-CCP  2. Infectious disease workup:    - Fungal cultures    - AFB cultures    - Blood cultures if febrile  3. Nutritional assessment:    - Vitamin D, B12    - Iron studies    - Protein/albumin levels    - Zinc levels  4. Vascular studies:    - Doppler ultrasound if concerned for vascular component  [Rest of plan remains as previously documented with addition of these tests]  This expanded differential better reflects the complexity of the case and will guide our diagnostic approach. Will prioritize testing based on clinical suspicion and patient's symptoms progression.

## 2023-10-29 NOTE — Discharge Instructions (Addendum)
It was a pleasure caring for you today.  Physical exam was reassuring.  Please follow-up with your primary care provider.  Seek emergency care if experiencing any new or worsening symptoms.

## 2023-10-29 NOTE — ED Provider Notes (Signed)
[place] [facility]ERGENCY DEPARTMENT AT [facility] Provider Note   CSN: [id number] Arrival date & time: 10/29/23  1005     History  Chief Complaint  Patient presents with   Insect Bite    Shelly Larson is a 52 y.o. female with PMHx anxiety, arthritis, asthma, DM, GERD, HTN, migraine who presents to ED concerned for scattered spider bites on right and left arm x3 months. Patient concerned because a provider that she saw a couple of weeks ago told her that she should get the wounds looked at again. Patient then woke up this morning and noticed that the wounds were itching again.  Patient has not taken benadryl today.  Denies fever, chest pain, dyspnea, cough, nausea, vomiting, diarrhea, dysuria, hematuria, hematochezia, arthralgias, seizures.   HPI     Home Medications Prior to Admission medications   Medication Sig Start Date End Date Taking? Authorizing Provider  albuterol (VENTOLIN HFA) 108 (90 Base) MCG/ACT inhaler Inhale 2 puffs into the lungs every 4 (four) hours as needed for wheezing or shortness of breath.  10/17/17   [provider]  amitriptyline (ELAVIL) 25 MG tablet Take 3 tablets (75 mg total) by mouth at bedtime. 05/03/23 05/02/24  Antony Madura, MD  amLODipine (NORVASC) 10 MG tablet TAKE 1 TABLET BY MOUTH EVERY DAY 06/04/23   Allwardt, Alyssa M, PA-C  amphetamine-dextroamphetamine (ADDERALL XR) 30 MG 24 hr capsule Take 1 capsule (30 mg total) by mouth every morning. 07/08/23   Allwardt, Alyssa M, PA-C  amphetamine-dextroamphetamine (ADDERALL) 15 MG tablet Take 1 tab po around 2 pm Patient taking differently: Take 15 mg by mouth daily as needed (in the afternoon as needed for focus/attention.). 10/09/22   Allwardt, Crist Infante, PA-C  clonazePAM (KLONOPIN) 0.5 MG tablet TAKE 1 TABLET BY MOUTH TWICE A DAY AS NEEDED FOR ANXIETY 10/19/23   Allwardt, Alyssa M, PA-C  gabapentin (NEURONTIN) 100 MG capsule Take 1 capsule (100 mg total) by mouth every 12 (twelve) hours  for 5 days. 10/16/23 10/21/23  Gaynelle Adu, MD  hydrocortisone (ANUSOL-HC) 2.5 % rectal cream Place 1 Application rectally 2 (two) times daily. Patient taking differently: Place 1 Application rectally 2 (two) times daily as needed for hemorrhoids or anal itching. 07/04/23   Geoffery Lyons, MD  linaclotide Karlene Einstein) 290 MCG CAPS capsule Take 1 capsule (290 mcg total) by mouth daily before breakfast. 10/12/22   Aida Raider, NP  losartan-hydrochlorothiazide (HYZAAR) 100-25 MG tablet TAKE 1 TABLET BY MOUTH EVERY DAY 10/08/23   Allwardt, Crist Infante, PA-C  metoprolol succinate (TOPROL-XL) 50 MG 24 hr tablet Take 50 mg by mouth every morning.  09/19/17   [provider]  omeprazole (PRILOSEC) 40 MG capsule Take 1 capsule (40 mg total) by mouth daily. Patient taking differently: Take 40 mg by mouth every evening. 03/19/23   Aida Raider, NP  ondansetron (ZOFRAN) 4 MG tablet Take 1 tablet (4 mg total) by mouth every 8 (eight) hours as needed. 10/16/23 10/15/24  Gaynelle Adu, MD  oxyCODONE (OXY IR/ROXICODONE) 5 MG immediate release tablet Take 1 tablet (5 mg total) by mouth every 6 (six) hours as needed for breakthrough pain or severe pain (pain score 7-10). 10/16/23   Gaynelle Adu, MD  promethazine (PHENERGAN) 12.5 MG tablet Take 1 tablet (12.5 mg total) by mouth every 6 (six) hours as needed for nausea or vomiting. 05/03/23   Antony Madura, MD  Rimegepant Sulfate (NURTEC) 75 MG TBDP Take 75 mg by mouth daily as needed. 10/27/22  Antony Madura, MD      Allergies    Lisinopril    Review of Systems   Review of Systems  Musculoskeletal:        Spider bite    Physical Exam Updated Vital Signs BP (!) 157/104 (BP Location: Left Arm)   Pulse 77   Temp 98.3 F (36.8 C) (Oral)   Resp 16   Ht 5\' 11"  (1.803 m)   Wt 125.1 kg   SpO2 98%   BMI 38.45 kg/m  Physical Exam Vitals and nursing note reviewed.  Constitutional:      General: She is not in acute distress.    Appearance: She is  not ill-appearing or toxic-appearing.  HENT:     Head: Normocephalic and atraumatic.  Eyes:     General: No scleral icterus.       Right eye: No discharge.        Left eye: No discharge.     Conjunctiva/sclera: Conjunctivae normal.  Cardiovascular:     Rate and Rhythm: Normal rate.  Pulmonary:     Effort: Pulmonary effort is normal.  Abdominal:     General: Abdomen is flat.  Skin:    General: Skin is warm and dry.     Comments: 3 scattered 1cm areas of circular hyperpigmented scarring on BL forearms. No increased warmth, erythema, or swelling/purulence appreciated. +2 radial pulses BL and sensation to light touch intact. Negative nikolsky sign.  Neurological:     General: No focal deficit present.     Mental Status: She is alert. Mental status is at baseline.  Psychiatric:        Mood and Affect: Mood normal.        Behavior: Behavior normal.     ED Results / Procedures / Treatments   Labs (all labs ordered are listed, but only abnormal results are displayed) Labs Reviewed - No data to display  EKG None  Radiology No results found.  Procedures Procedures    Medications Ordered in ED Medications  diphenhydrAMINE (BENADRYL) capsule 25 mg (25 mg Oral Given 10/29/23 1114)    ED Course/ Medical Decision Making/ A&P                                 Medical Decision Making  This patient presents to the ED for concern of spider bite, this involves an extensive number of treatment options, and is a complaint that carries with it a high risk of complications and morbidity.  The differential diagnosis includes cellulitis/abscess, sepsis, SJS/TEN,    Co morbidities that complicate the patient evaluation  anxiety, arthritis, asthma, DM, GERD, HTN, migraine    Additional history obtained:  PCP Allwardt PA-C   Problem List / ED Course / Critical interventions / Medication management  Patient presents to ED concerned for brown recluse spider bites that has been  present for the past 3 months.  Patient stating that she is concerned because the provider told her a couple weeks ago that she should get these wounds checked out again.  Patient woke up this morning and her wounds for itching so she came to the ED.  Did not take Benadryl.  Denying any other infectious complaints today.  Physical exam reassuring.  Brown recluse spider bite seem to be healing appropriately.  Patient afebrile with stable vitals.  Provided patient with a dose of Benadryl.  Educated patient that she will need to follow-up with  her PCP and possibly dermatology as well.  She verbalized understanding of plan.  Patient stating that she has a appointment set up with dermatology. Staffed patient with Dr. Lockie Mola I have reviewed the patients home medicines and have made adjustments as needed Patient afebrile with stable vitals.  Provided with return precautions.  Discharged in good condition.   Social Determinants of Health:  none          Final Clinical Impression(s) / ED Diagnoses Final diagnoses:  Spider bite wound, accidental or unintentional, subsequent encounter    Rx / DC Orders ED Discharge Orders     None         Dorthy Cooler, [place] 10/29/23 1120    Virgina Norfolk, DO 10/29/23 1435

## 2023-10-30 ENCOUNTER — Encounter: Payer: Managed Care, Other (non HMO) | Attending: General Surgery | Admitting: Dietician

## 2023-10-30 ENCOUNTER — Encounter: Payer: Self-pay | Admitting: Dietician

## 2023-10-30 VITALS — Ht 70.5 in | Wt 272.2 lb

## 2023-10-30 DIAGNOSIS — I10 Essential (primary) hypertension: Secondary | ICD-10-CM | POA: Diagnosis present

## 2023-10-30 DIAGNOSIS — Z9884 Bariatric surgery status: Secondary | ICD-10-CM | POA: Insufficient documentation

## 2023-10-30 DIAGNOSIS — Z48815 Encounter for surgical aftercare following surgery on the digestive system: Secondary | ICD-10-CM | POA: Diagnosis not present

## 2023-10-30 DIAGNOSIS — E559 Vitamin D deficiency, unspecified: Secondary | ICD-10-CM | POA: Insufficient documentation

## 2023-10-30 DIAGNOSIS — E782 Mixed hyperlipidemia: Secondary | ICD-10-CM | POA: Diagnosis not present

## 2023-10-30 DIAGNOSIS — E119 Type 2 diabetes mellitus without complications: Secondary | ICD-10-CM

## 2023-10-30 DIAGNOSIS — R7303 Prediabetes: Secondary | ICD-10-CM | POA: Diagnosis present

## 2023-10-30 DIAGNOSIS — Z713 Dietary counseling and surveillance: Secondary | ICD-10-CM | POA: Insufficient documentation

## 2023-10-30 DIAGNOSIS — Z6838 Body mass index (BMI) 38.0-38.9, adult: Secondary | ICD-10-CM | POA: Insufficient documentation

## 2023-10-30 DIAGNOSIS — E669 Obesity, unspecified: Secondary | ICD-10-CM

## 2023-10-30 LAB — PATHOLOGIST SMEAR REVIEW

## 2023-10-30 LAB — LACTATE DEHYDROGENASE: LDH: 167 U/L (ref 100–200)

## 2023-10-30 LAB — RETICULOCYTES
ABS Retic: 58440 {cells}/uL (ref 20000–80000)
Retic Ct Pct: 1.2 %

## 2023-10-30 NOTE — Progress Notes (Signed)
2 Week Post-Operative Nutrition Class   Patient was seen on 10/30/2023 for Post-Operative Nutrition education at the [facility].    Surgery date: 10/15/2023 Surgery type: Sleeve Gastrectomy  Anthropometrics  Start weight at [facility]: 282.6 lbs (date: 04/06/2023)  Height: 70 in Weight today: 272.2 lb    Clinical   Pharmacotherapy: History of weight loss medication used: phentermine  Medical hx: cancer, asthma, obesity, HTN Medications: adderall, amlodipine, losartan, linzess, omeprazole  Labs: glucose 103; RBC 5.32 Notable signs/symptoms: none noted Any previous deficiencies? No Bowel Habits: Every day to every other day no complaints   Body Composition Scale 10/30/2023  Current Body Weight 272.2  Total Body Fat % 43.8  Visceral Fat 14  Fat-Free Mass % 56.1   Total Body Water % 42.5  Muscle-Mass lbs 36.6  BMI 38.2  Body Fat Displacement          Torso  lbs 74.1         Left Leg  lbs 14.8         Right Leg  lbs 14.8         Left Arm  lbs 7.4         Right Arm  lbs 7.4    The following the learning objectives were met by the patient during this course: Identifies Soft Prepped Plan Advancement Guide  Identifies Soft, High Proteins (Phase 1), beginning 2 weeks post-operatively to 3 weeks post-operatively Identifies Additional Soft High Proteins, soft non-starchy vegetables, fruits and starches (Phase 2), beginning 3 weeks post-operatively to 3 months post-operatively Identifies appropriate sources of fluids, proteins, vegetables, fruits and starches Identifies appropriate fat sources and healthy verses unhealthy fat types   States protein, vegetable, fruit and starch recommendations and appropriate sources post-operatively Identifies the need for appropriate texture modifications, mastication, and bite sizes when consuming solids Identifies appropriate fat consumption and sources Identifies appropriate multivitamin and calcium sources  post-operatively Describes the need for physical activity post-operatively and will follow MD recommendations States when to call healthcare provider regarding medication questions or post-operative complications   Handouts given during class include: Soft Prepped Plan Advancement Guide   Follow-Up Plan: Patient will follow-up at [facility] in 10 weeks for 3 month post-op nutrition visit for diet advancement per MD.

## 2023-10-31 ENCOUNTER — Other Ambulatory Visit: Payer: Self-pay | Admitting: Neurology

## 2023-10-31 DIAGNOSIS — G43E09 Chronic migraine with aura, not intractable, without status migrainosus: Secondary | ICD-10-CM

## 2023-11-01 ENCOUNTER — Telehealth: Payer: Self-pay

## 2023-11-01 ENCOUNTER — Other Ambulatory Visit (HOSPITAL_COMMUNITY): Payer: Self-pay

## 2023-11-01 MED ORDER — NURTEC 75 MG PO TBDP: 75.0000 mg | ORAL_TABLET | Freq: Every day | ORAL | 5 refills | Status: AC | PRN

## 2023-11-01 NOTE — Telephone Encounter (Signed)
No PA is needed

## 2023-11-01 NOTE — Addendum Note (Signed)
Addended by: Antony Madura on: 11/01/2023 11:57 AM   Modules accepted: Orders

## 2023-11-03 ENCOUNTER — Other Ambulatory Visit: Payer: Self-pay | Admitting: Gastroenterology

## 2023-11-06 ENCOUNTER — Telehealth: Payer: Self-pay | Admitting: Dietician

## 2023-11-06 NOTE — Telephone Encounter (Signed)
RD called pt to verify fluid intake once starting soft, solid proteins 2 week post-bariatric surgery.   Daily Fluid intake:  Daily Protein intake:  Bowel Habits:   Concerns/issues:    Left Voice Message 

## 2023-11-07 ENCOUNTER — Telehealth: Payer: Self-pay

## 2023-11-07 ENCOUNTER — Ambulatory Visit (INDEPENDENT_AMBULATORY_CARE_PROVIDER_SITE_OTHER): Payer: Managed Care, Other (non HMO) | Admitting: Neurology

## 2023-11-07 ENCOUNTER — Encounter: Payer: Self-pay | Admitting: Neurology

## 2023-11-07 ENCOUNTER — Other Ambulatory Visit (INDEPENDENT_AMBULATORY_CARE_PROVIDER_SITE_OTHER): Payer: Managed Care, Other (non HMO)

## 2023-11-07 VITALS — BP 117/75 | HR 72 | Ht 64.0 in | Wt 270.0 lb

## 2023-11-07 DIAGNOSIS — G43E09 Chronic migraine with aura, not intractable, without status migrainosus: Secondary | ICD-10-CM

## 2023-11-07 DIAGNOSIS — E559 Vitamin D deficiency, unspecified: Secondary | ICD-10-CM

## 2023-11-07 DIAGNOSIS — R29898 Other symptoms and signs involving the musculoskeletal system: Secondary | ICD-10-CM

## 2023-11-07 DIAGNOSIS — M542 Cervicalgia: Secondary | ICD-10-CM

## 2023-11-07 DIAGNOSIS — R110 Nausea: Secondary | ICD-10-CM

## 2023-11-07 LAB — TSH: TSH: 1.34 u[IU]/mL (ref 0.35–5.50)

## 2023-11-07 LAB — VITAMIN B12: Vitamin B-12: 551 pg/mL (ref 211–911)

## 2023-11-07 MED ORDER — ZAVZPRET 10 MG/ACT NA SOLN: 10.0000 mg | NASAL | 5 refills | Status: AC | PRN

## 2023-11-07 MED ORDER — METHYLPREDNISOLONE 4 MG PO TBPK: ORAL_TABLET | ORAL | 0 refills | Status: DC

## 2023-11-07 MED ORDER — AIMOVIG 140 MG/ML ~~LOC~~ SOAJ: 140.0000 mg | SUBCUTANEOUS | 11 refills | Status: AC

## 2023-11-07 MED ORDER — PROMETHAZINE HCL 12.5 MG PO TABS
12.5000 mg | ORAL_TABLET | Freq: Four times a day (QID) | ORAL | 3 refills | Status: AC | PRN
Start: 2023-11-07 — End: ?

## 2023-11-07 NOTE — Telephone Encounter (Signed)
Medication Samples have been provided to the patient.  Drug name: Zavegepant HCl    Strength: 10 MG/ACT SOLN            Qty: 1  LOT: [id number]  Exp.Date: 2026-01  Dosing instructions:  Place 10 mg into the nose as needed (for migraine).   The patient has been instructed regarding the correct time, dose, and frequency of taking this medication, including desired effects and most common side effects.   Lenise Herald 11:29 AM 11/07/2023

## 2023-11-07 NOTE — Patient Instructions (Addendum)
Medrol dose pak to try to break current headache cycle Migraine prevention:  Continue Elavil to 75 mg at bedtime; Will order Aimovig today. This is an injectable that you take every 4 weeks. It may take a month to get this approved, so continue current meds for now. When you have the Aimovig, let me know and I will tell you how to slowly stop the Elavil. Migraine rescue:  Continue Nurtec 75 mg PRN; Will order Zavzpret nasal spray today. This may also take a month to get approved, so continue Nurtec until we get it approved. Limit use of pain relievers to no more than 2 days out of week to prevent risk of rebound or medication-overuse headache. Keep headache diary Continue vit D 1000 IU daily  -Blood work today. I will be in touch when I have the results.  I will see you back in clinic in 3 months.  Please let me know if you have any questions or concerns in the meantime.  The physicians and staff at [facility] are committed to providing excellent care. You may receive a survey requesting feedback about your experience at our office. We strive to receive "very good" responses to the survey questions. If you feel that your experience would prevent you from giving the office a "very good " response, please contact our office to try to remedy the situation. We may be reached at [phone]. Thank you for taking the time out of your busy day to complete the survey.  Jacquelyne Balint, MD [facility]

## 2023-11-09 ENCOUNTER — Other Ambulatory Visit (HOSPITAL_COMMUNITY): Payer: Self-pay

## 2023-11-09 NOTE — Telephone Encounter (Signed)
Pharmacy Patient Advocate Encounter  Insurance verification completed.    The patient is insured through  system showing multiple plans-both running a $0.00 with a manufacture card through [facility]    Ran test claim for Zavzpret 10mg /act solution. Currently a quantity of 6 is a 30 day supply and the co-pay is $0.00 .   This test claim was processed through [facility]- copay amounts may vary at other pharmacies due to pharmacy/plan contracts, or as the patient moves through the different stages of their insurance plan.

## 2023-11-10 LAB — VITAMIN B1: Vitamin B1 (Thiamine): 15 nmol/L (ref 8–30)

## 2023-11-12 ENCOUNTER — Emergency Department (HOSPITAL_COMMUNITY)
Admission: EM | Admit: 2023-11-12 | Discharge: 2023-11-12 | Discharge status: Against Medical Advice | Payer: Managed Care, Other (non HMO)

## 2023-11-12 ENCOUNTER — Other Ambulatory Visit (HOSPITAL_COMMUNITY): Payer: Self-pay

## 2023-11-12 NOTE — ED Notes (Signed)
Pt called x 3 no answer 

## 2023-11-15 ENCOUNTER — Other Ambulatory Visit: Payer: Self-pay | Admitting: Neurology

## 2023-11-15 DIAGNOSIS — G43E09 Chronic migraine with aura, not intractable, without status migrainosus: Secondary | ICD-10-CM

## 2023-11-16 ENCOUNTER — Other Ambulatory Visit (HOSPITAL_COMMUNITY): Payer: Self-pay

## 2023-11-16 ENCOUNTER — Telehealth: Payer: Self-pay | Admitting: Pharmacy Technician

## 2023-11-16 NOTE — Telephone Encounter (Signed)
Pharmacy Patient Advocate Encounter   Received notification from RX Request Messages that prior authorization for NURTEC 75MG  is required/requested.   Insurance verification completed.   The patient is insured through [employer] .   Per test claim: PA required; PA submitted to above mentioned insurance via CoverMyMeds Key/confirmation #/EOC [id number] Status is pending

## 2023-11-16 NOTE — Telephone Encounter (Signed)
PA has been submitted, and telephone encounter has been created. 

## 2023-11-19 ENCOUNTER — Other Ambulatory Visit: Payer: Self-pay | Admitting: General Surgery

## 2023-11-19 ENCOUNTER — Ambulatory Visit: Payer: Managed Care, Other (non HMO) | Admitting: Dermatology

## 2023-11-19 ENCOUNTER — Encounter: Payer: Self-pay | Admitting: Dermatology

## 2023-11-19 VITALS — BP 158/105 | HR 85

## 2023-11-19 DIAGNOSIS — L810 Postinflammatory hyperpigmentation: Secondary | ICD-10-CM

## 2023-11-19 DIAGNOSIS — L853 Xerosis cutis: Secondary | ICD-10-CM

## 2023-11-19 DIAGNOSIS — Z9884 Bariatric surgery status: Secondary | ICD-10-CM

## 2023-11-19 DIAGNOSIS — R1310 Dysphagia, unspecified: Secondary | ICD-10-CM

## 2023-11-19 DIAGNOSIS — L819 Disorder of pigmentation, unspecified: Secondary | ICD-10-CM | POA: Diagnosis not present

## 2023-11-19 DIAGNOSIS — L300 Nummular dermatitis: Secondary | ICD-10-CM

## 2023-11-19 DIAGNOSIS — R109 Unspecified abdominal pain: Secondary | ICD-10-CM

## 2023-11-19 MED ORDER — CLOBETASOL PROPIONATE 0.05 % EX OINT: 1.0000 | TOPICAL_OINTMENT | Freq: Two times a day (BID) | CUTANEOUS | 4 refills | Status: DC

## 2023-11-19 MED ORDER — SAFETY SEAL MISCELLANEOUS MISC: 1.0000 | Freq: Every evening | 0 refills | Status: AC

## 2023-11-19 NOTE — Progress Notes (Signed)
   New Patient Visit   Subjective  Shelly Larson is a 52 y.o. female who presents for the following: Skin Lesions  Patient states she has skin lesions located at the Right posterior arm and right hand that she would like to have examined. Patient reports the areas have been there for 2 months. She states she was told by her pcp that the lesions were some sort of spider bites. She reports the areas are bothersome.Patient rates irritation 10 out of 10. She states that the areas have spread. Patient reports she has previously been treated for these areas. Patient denies Hx of bx. Patient denies family history of skin cancer(s).  The following portions of the chart were reviewed this encounter and updated as appropriate: medications, allergies, medical history  Review of Systems:  No other skin or systemic complaints except as noted in HPI or Assessment and Plan.  Objective  Well appearing patient in no apparent distress; mood and affect are within normal limits.  A focused examination was performed of the following areas: Right arm and hand  Relevant exam findings are noted in the Assessment and Plan.                   Assessment & Plan   1. Nummular Dermatitis - Assessment: Patient presents with itchy, inflamed, pink/red lesions with occasional blistering, initially misdiagnosed as a spider bite. Symptoms have persisted for approximately 2.5 months. 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 - Plan: Prescribe clobetasol, a strong topical steroid. Apply clobetasol twice daily for 2 weeks. Discontinue previously prescribed mupirocin cream and antibiotic. If dermatitis returns, restart clobetasol immediately, but limit use to 2 weeks at a time. Follow-up in 3 months.  2. Hyperpigmentation - Assessment: Patient reports a hyperpigmented patch on the face, initially presenting like a cold sore. The area sloughs off when washed  and recurs. - Plan: Prescribe custom compounded cream from [facility] pharmacy containing tranexamic acid, resveratrol, vitamin C, and niacinamide. Apply cream twice daily for one month, then apply all over for even skin tone. Pharmacy to contact patient regarding prescription (approximate cost $40).  3. 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 - Plan: Continue using Aquaphor on damp skin after showering. Consider adding Eucerin Advanced Repair under Aquaphor. Recommend Aquaphor spray for easier application. Suggest Aveeno oatmeal baths once or twice weekly if bathtub is accessible. Recommend moisturizers containing urea, such as Lipikar Urea 10% Hydrating Lotion. Advise use of hyaluronic acid-based products before moisturizer, such as Vichy Mineral 89 Thermal Water. Provide samples of face washes from [facility] and Excedrin. Consider using a humidifier during sleep.  Follow-up as needed for any unresolved or worsening issues.      Nummular eczema  Related Medications clobetasol ointment (TEMOVATE) 0.05 % Apply 1 Application topically 2 (two) times daily. AFTER 2 WEEKS THEN STOP  Return in about 3 months (around 02/19/2024) for Nummular Eczema nad PIH F/U.  Documentation: I have reviewed the above documentation for accuracy and completeness, and I agree with the above.  Stasia Cavalier, am acting as scribe for Langston Reusing, DO.  Langston Reusing, DO

## 2023-11-19 NOTE — Patient Instructions (Addendum)
Hello Shelly Larson,  Thank you for visiting my office today. Your dedication to enhancing your skin health is greatly appreciated. Below is a summary of our discussion and the established treatment plan:  - Clobetasol Cream:   - Application: Apply twice daily to affected areas on arm and hand.   - Duration: Continue this treatment for two weeks to address nummular dermatitis.  - Moisturizers:   - Products: Continue with Aquaphor, applying especially after showers on damp skin. Additionally, consider incorporating Eucerin Advanced Repair for enhanced hydration.  - Lightening Cream:   - Ingredients: After the inflammation has subsided, use the prescribed cream containing tranexamic acid, resveratrol, vitamin C, and niacinamide.   - Frequency: Apply twice daily.   - Duration: Use for one month to treat hyperpigmentation.  - Hydration Tips:   - Recommendations: Apply hyaluronic acid before your moisturizer to boost skin hydration. Also, consider using a humidifier at night to help maintain skin moisture.  - Bathing:   - Suggestion: Use Aveeno oatmeal baths 1-2 times a week, followed by Aquaphor to lock in moisture.  - Follow-Up Appointment:   - Schedule: We will see you again in three months to assess progress and possibly adjust your treatment plan.  Please ensure to pick up your prescriptions from your designated pharmacies as instructed. Should you have any questions or concerns before our next meeting, please feel free to reach out to the office.  Wishing you a healthy and joyful holiday season.  Warm regards,  Dr. [facility] Reusing [facility]            Important Information   Due to recent changes in healthcare laws, you may see results of your pathology and/or laboratory studies on MyChart before the doctors have had a chance to review them. We understand that in some cases there may be results that are confusing or concerning to you. Please understand that not all results are  received at the same time and often the doctors may need to interpret multiple results in order to provide you with the best plan of care or course of treatment. Therefore, we ask that you please give [place] 2 business days to thoroughly review all your results before contacting the office for clarification. Should we see a critical lab result, you will be contacted sooner.     If You Need Anything After Your Visit   If you have any questions or concerns for your doctor, please call our main line at [phone]. If no one answers, please leave a voicemail as directed and we will return your call as soon as possible. Messages left after 4 pm will be answered the following business day.    You may also send [place] a message via MyChart. We typically respond to MyChart messages within 1-2 business days.  For prescription refills, please ask your pharmacy to contact our office. Our fax number is [phone].  If you have an urgent issue when the clinic is closed that cannot wait until the next business day, you can page your doctor at the number below.     Please note that while we do our best to be available for urgent issues outside of office hours, we are not available 24/7.    If you have an urgent issue and are unable to reach [place], you may choose to seek medical care at your doctor's office, retail clinic, urgent care center, or emergency room.   If you have a medical emergency, please immediately call 911 or go  to the emergency department. In the event of inclement weather, please call our main line at [phone] for an update on the status of any delays or closures.  Dermatology Medication Tips: Please keep the boxes that topical medications come in in order to help keep track of the instructions about where and how to use these. Pharmacies typically print the medication instructions only on the boxes and not directly on the medication tubes.   If your medication is too expensive, please contact  our office at [phone] or send [place] a message through MyChart.    We are unable to tell what your co-pay for medications will be in advance as this is different depending on your insurance coverage. However, we may be able to find a substitute medication at lower cost or fill out paperwork to get insurance to cover a needed medication.    If a prior authorization is required to get your medication covered by your insurance company, please allow [place] 1-2 business days to complete this process.   Drug prices often vary depending on where the prescription is filled and some pharmacies may offer cheaper prices.   The website www.goodrx.com contains coupons for medications through different pharmacies. The prices here do not account for what the cost may be with help from insurance (it may be cheaper with your insurance), but the website can give you the price if you did not use any insurance.  - You can print the associated coupon and take it with your prescription to the pharmacy.  - You may also stop by our office during regular business hours and pick up a GoodRx coupon card.  - If you need your prescription sent electronically to a different pharmacy, notify our office through [facility] or by phone at [phone]

## 2023-11-21 ENCOUNTER — Telehealth (HOSPITAL_COMMUNITY): Payer: Self-pay | Admitting: *Deleted

## 2023-11-21 NOTE — Telephone Encounter (Signed)
Patient stated she still pressing through with the with minimal intake. Patient states that she does not feel hungry, nonetheless she has only been able to take a couple bites of food before feeling discomfort (cramp-like sensation). Patient stated the Carafate has been slightly helpful, but she can only take but so much at a time then she feels her pouch getting full.  Patient aware to contact [facility] should any concerns arise prior to next appointment.

## 2023-11-26 ENCOUNTER — Other Ambulatory Visit: Payer: Managed Care, Other (non HMO)

## 2023-11-26 ENCOUNTER — Ambulatory Visit
Admission: RE | Admit: 2023-11-26 | Discharge: 2023-11-26 | Disposition: A | Discharge status: Home / Self Care | Payer: Managed Care, Other (non HMO) | Source: Ambulatory Visit | Attending: General Surgery | Admitting: General Surgery

## 2023-11-26 ENCOUNTER — Telehealth (HOSPITAL_COMMUNITY): Payer: Self-pay | Admitting: *Deleted

## 2023-11-26 ENCOUNTER — Other Ambulatory Visit: Payer: Self-pay | Admitting: General Surgery

## 2023-11-26 DIAGNOSIS — R1310 Dysphagia, unspecified: Secondary | ICD-10-CM

## 2023-11-26 DIAGNOSIS — Z9884 Bariatric surgery status: Secondary | ICD-10-CM

## 2023-11-26 DIAGNOSIS — R109 Unspecified abdominal pain: Secondary | ICD-10-CM

## 2023-11-26 NOTE — Telephone Encounter (Signed)
Followed up with the patient today to ask about fluid intake, she reported that she is still struggling with intake of fluids. It has improved slightly, she has gotten up to 32oz of fluids within the past few days. Patient denies dizziness or HR increase. Patient however stated "I had to take medication (Nurtec) for a migraine onset today" (despite her prior hx with migraines).  Bariatric surgeon and NDES dietician notified via Epic Message

## 2023-11-27 ENCOUNTER — Other Ambulatory Visit (HOSPITAL_COMMUNITY): Payer: Self-pay

## 2023-11-27 ENCOUNTER — Other Ambulatory Visit: Payer: Self-pay

## 2023-11-27 ENCOUNTER — Telehealth: Payer: Self-pay | Admitting: Pharmacy Technician

## 2023-11-27 ENCOUNTER — Ambulatory Visit: Payer: Managed Care, Other (non HMO)

## 2023-11-27 VITALS — BP 173/90 | HR 59 | Temp 98.3°F | Resp 16 | Ht 70.0 in | Wt 260.4 lb

## 2023-11-27 DIAGNOSIS — E860 Dehydration: Secondary | ICD-10-CM | POA: Insufficient documentation

## 2023-11-27 MED ORDER — SODIUM CHLORIDE 0.9 % IV BOLUS
2000.0000 mL | Freq: Once | INTRAVENOUS | Status: AC
  Administered 2023-11-27: 2000 mL via INTRAVENOUS

## 2023-11-27 NOTE — Progress Notes (Signed)
Diagnosis: Dehydration  Provider:  Chilton Greathouse MD  Procedure: IV Infusion  IV Type: Peripheral, IV Location: R Forearm  Normal Saline, Dose: 2000 ml  Infusion Start Time: 1138  Infusion Stop Time: 1352  Post Infusion IV Care: Peripheral IV Discontinued  Discharge: Condition: Good, Destination: Home . AVS Declined  Performed by:  Rico Ala, LPN

## 2023-11-27 NOTE — Telephone Encounter (Signed)
Pharmacy Patient Advocate Encounter   Received notification from Fax that prior authorization for ZAVZPRET 10MG  is required/requested.   Insurance verification completed.   The patient is insured through [employer] .   Per test claim: PA required; PA submitted to above mentioned insurance via CoverMyMeds Key/confirmation #/EOC [id number] Status is pending

## 2023-12-06 ENCOUNTER — Other Ambulatory Visit (HOSPITAL_COMMUNITY): Payer: Self-pay

## 2023-12-06 ENCOUNTER — Other Ambulatory Visit: Payer: Managed Care, Other (non HMO)

## 2023-12-07 ENCOUNTER — Telehealth: Payer: Self-pay

## 2023-12-07 ENCOUNTER — Telehealth: Payer: Self-pay | Admitting: Pharmacy Technician

## 2023-12-07 ENCOUNTER — Other Ambulatory Visit (HOSPITAL_COMMUNITY): Payer: Self-pay

## 2023-12-07 NOTE — Telephone Encounter (Signed)
Pharmacy Patient Advocate Encounter  Received notification from [employer] that Prior Authorization for AIMOVIG 140MG  has been APPROVED from 12.13.24 to 12.13.25. Ran test claim, Copay is $0. This test claim was processed through [facility]- copay amounts may vary at other pharmacies due to pharmacy/plan contracts, or as the patient moves through the different stages of their insurance plan.   PA #/Case ID/Reference #: [id number]

## 2023-12-07 NOTE — Telephone Encounter (Signed)
Pharmacy Patient Advocate Encounter  Received notification from [employer] that Prior Authorization for ZAVZPRET 10MG  has been APPROVED from 12.3.24 to 12.3.25. Unable to obtain price due to refill too soon rejection, last fill date 12.6.24 next available fill date12.20.24   PA #/Case ID/Reference #: [id number]

## 2023-12-07 NOTE — Telephone Encounter (Signed)
Pharmacy Patient Advocate Encounter   Received notification from CoverMyMeds that prior authorization for AIMOVIG 140MG  is required/requested.   Insurance verification completed.   The patient is insured through [employer] .   Per test claim: PA required; PA submitted to above mentioned insurance via CoverMyMeds Key/confirmation #/EOC [id number] Status is pending

## 2023-12-07 NOTE — Telephone Encounter (Signed)
Called patient and made her aware that all meds are approved. She understood.

## 2023-12-07 NOTE — Telephone Encounter (Signed)
PA has been submitted, and telephone encounter has been created. PA IS APPROVED

## 2023-12-13 ENCOUNTER — Other Ambulatory Visit: Payer: Self-pay | Admitting: Physician Assistant

## 2023-12-13 DIAGNOSIS — F410 Panic disorder [episodic paroxysmal anxiety] without agoraphobia: Secondary | ICD-10-CM

## 2023-12-14 ENCOUNTER — Telehealth: Payer: Self-pay

## 2023-12-14 DIAGNOSIS — F410 Panic disorder [episodic paroxysmal anxiety] without agoraphobia: Secondary | ICD-10-CM

## 2023-12-14 MED ORDER — CLONAZEPAM 0.5 MG PO TABS: 0.5000 mg | ORAL_TABLET | Freq: Two times a day (BID) | ORAL | 1 refills | Status: AC

## 2023-12-14 NOTE — Telephone Encounter (Signed)
Copied from CRM [id number]. Topic: Clinical - Medication Question >> Dec 13, 2023  4:02 PM Samuel Jester B wrote: Reason for CRM: The PT would like to speak with her provider regarding medication clonazepam (0.5 MG tablet, she stated that she is aware of having 1 refill left, however the pharmacy is stating they are not able to give her the medication due to "waiting on a answer" from her PCP.  Please see above CRM message from 12/13/23, pt called regarding medication refill. Refill request is in pending status.

## 2023-12-17 ENCOUNTER — Ambulatory Visit: Payer: Managed Care, Other (non HMO) | Admitting: Dietician

## 2023-12-24 ENCOUNTER — Other Ambulatory Visit (HOSPITAL_COMMUNITY): Payer: Self-pay

## 2024-01-08 ENCOUNTER — Telehealth (HOSPITAL_COMMUNITY): Payer: Self-pay | Admitting: *Deleted

## 2024-01-08 NOTE — Telephone Encounter (Signed)
 Spoke with patient to assess how she was doing post-operatively. Patient stated that she is now at the point where she either has intense pain upon drinking/eating or everything comes back up as emesis. Her intake of fluids per the patient has been minimal. Patient stated that she has tried multiple efforts for intake of fluids - soups, broth, water  (alkaline) and all is producing the same response. She admitted to drinking some carbonated beverage to help with belching and did not report much relief from that.  She tried taking phenergan  as prescribed and she ended up throwing that up. She said that she does not have any other s/s to report and that overall she does not feel like she is tired or hungry at all. In fact she has to set reminders to eat/drink.   Patient made aware that her surgeon would be updated.

## 2024-01-15 ENCOUNTER — Ambulatory Visit: Payer: Managed Care, Other (non HMO) | Admitting: Dietician

## 2024-01-23 ENCOUNTER — Encounter: Payer: Self-pay | Admitting: General Surgery

## 2024-01-24 ENCOUNTER — Ambulatory Visit: Payer: Managed Care, Other (non HMO) | Admitting: Physician Assistant

## 2024-01-25 ENCOUNTER — Ambulatory Visit: Payer: Managed Care, Other (non HMO) | Admitting: Dietician

## 2024-02-14 ENCOUNTER — Other Ambulatory Visit (HOSPITAL_COMMUNITY): Payer: Self-pay

## 2024-02-18 ENCOUNTER — Encounter: Payer: Self-pay | Admitting: Physician Assistant

## 2024-02-19 ENCOUNTER — Ambulatory Visit: Payer: Managed Care, Other (non HMO) | Admitting: Dermatology

## 2024-02-22 NOTE — Telephone Encounter (Signed)
 Marland Kitchen

## 2024-02-28 ENCOUNTER — Emergency Department (HOSPITAL_BASED_OUTPATIENT_CLINIC_OR_DEPARTMENT_OTHER)

## 2024-02-28 ENCOUNTER — Other Ambulatory Visit: Payer: Self-pay

## 2024-02-28 ENCOUNTER — Encounter (HOSPITAL_BASED_OUTPATIENT_CLINIC_OR_DEPARTMENT_OTHER): Payer: Self-pay | Admitting: Emergency Medicine

## 2024-02-28 ENCOUNTER — Inpatient Hospital Stay (HOSPITAL_BASED_OUTPATIENT_CLINIC_OR_DEPARTMENT_OTHER)
Admission: EM | Admit: 2024-02-28 | Discharge: 2024-03-03 | DRG: 389 | Discharge status: Against Medical Advice | Attending: Surgery | Admitting: Surgery

## 2024-02-28 ENCOUNTER — Encounter: Payer: Self-pay | Admitting: General Surgery

## 2024-02-28 DIAGNOSIS — Z85038 Personal history of other malignant neoplasm of large intestine: Secondary | ICD-10-CM

## 2024-02-28 DIAGNOSIS — Z903 Acquired absence of stomach [part of]: Secondary | ICD-10-CM | POA: Diagnosis not present

## 2024-02-28 DIAGNOSIS — F1090 Alcohol use, unspecified, uncomplicated: Secondary | ICD-10-CM | POA: Diagnosis present

## 2024-02-28 DIAGNOSIS — I10 Essential (primary) hypertension: Secondary | ICD-10-CM | POA: Diagnosis present

## 2024-02-28 DIAGNOSIS — N179 Acute kidney failure, unspecified: Secondary | ICD-10-CM | POA: Diagnosis present

## 2024-02-28 DIAGNOSIS — Z9049 Acquired absence of other specified parts of digestive tract: Secondary | ICD-10-CM

## 2024-02-28 DIAGNOSIS — E669 Obesity, unspecified: Secondary | ICD-10-CM | POA: Diagnosis present

## 2024-02-28 DIAGNOSIS — Z8719 Personal history of other diseases of the digestive system: Secondary | ICD-10-CM

## 2024-02-28 DIAGNOSIS — Z6831 Body mass index (BMI) 31.0-31.9, adult: Secondary | ICD-10-CM

## 2024-02-28 DIAGNOSIS — R066 Hiccough: Secondary | ICD-10-CM | POA: Diagnosis not present

## 2024-02-28 DIAGNOSIS — Z833 Family history of diabetes mellitus: Secondary | ICD-10-CM

## 2024-02-28 DIAGNOSIS — K56609 Unspecified intestinal obstruction, unspecified as to partial versus complete obstruction: Secondary | ICD-10-CM | POA: Diagnosis present

## 2024-02-28 DIAGNOSIS — Z8249 Family history of ischemic heart disease and other diseases of the circulatory system: Secondary | ICD-10-CM | POA: Diagnosis not present

## 2024-02-28 DIAGNOSIS — Z79899 Other long term (current) drug therapy: Secondary | ICD-10-CM

## 2024-02-28 DIAGNOSIS — Z803 Family history of malignant neoplasm of breast: Secondary | ICD-10-CM

## 2024-02-28 DIAGNOSIS — Z5329 Procedure and treatment not carried out because of patient's decision for other reasons: Secondary | ICD-10-CM | POA: Diagnosis present

## 2024-02-28 DIAGNOSIS — Z888 Allergy status to other drugs, medicaments and biological substances status: Secondary | ICD-10-CM | POA: Diagnosis not present

## 2024-02-28 DIAGNOSIS — K56600 Partial intestinal obstruction, unspecified as to cause: Principal | ICD-10-CM | POA: Diagnosis present

## 2024-02-28 DIAGNOSIS — E119 Type 2 diabetes mellitus without complications: Secondary | ICD-10-CM | POA: Diagnosis present

## 2024-02-28 DIAGNOSIS — K219 Gastro-esophageal reflux disease without esophagitis: Secondary | ICD-10-CM | POA: Diagnosis present

## 2024-02-28 LAB — COMPREHENSIVE METABOLIC PANEL
ALT: 15 U/L (ref 0–44)
AST: 15 U/L (ref 15–41)
Albumin: 4.6 g/dL (ref 3.5–5.0)
Alkaline Phosphatase: 61 U/L (ref 38–126)
Anion gap: 11 (ref 5–15)
BUN: 16 mg/dL (ref 6–20)
CO2: 25 mmol/L (ref 22–32)
Calcium: 10.2 mg/dL (ref 8.9–10.3)
Chloride: 101 mmol/L (ref 98–111)
Creatinine, Ser: 1.22 mg/dL — ABNORMAL HIGH (ref 0.44–1.00)
GFR, Estimated: 54 mL/min — ABNORMAL LOW (ref >60)
Glucose, Bld: 147 mg/dL — ABNORMAL HIGH (ref 70–99)
Potassium: 3.5 mmol/L (ref 3.5–5.1)
Sodium: 137 mmol/L (ref 135–145)
Total Bilirubin: 0.8 mg/dL (ref 0.0–1.2)
Total Protein: 8.7 g/dL — ABNORMAL HIGH (ref 6.5–8.1)

## 2024-02-28 LAB — CBC
HCT: 48.7 % — ABNORMAL HIGH (ref 36.0–46.0)
Hemoglobin: 15.9 g/dL — ABNORMAL HIGH (ref 12.0–15.0)
MCH: 27.4 pg (ref 26.0–34.0)
MCHC: 32.6 g/dL (ref 30.0–36.0)
MCV: 84 fL (ref 80.0–100.0)
Platelets: 186 10*3/uL (ref 150–400)
RBC: 5.8 MIL/uL — ABNORMAL HIGH (ref 3.87–5.11)
RDW: 14.2 % (ref 11.5–15.5)
WBC: 8 10*3/uL (ref 4.0–10.5)
nRBC: 0 % (ref 0.0–0.2)

## 2024-02-28 LAB — GLUCOSE, CAPILLARY
Glucose-Capillary: 127 mg/dL — ABNORMAL HIGH (ref 70–99)
Glucose-Capillary: 137 mg/dL — ABNORMAL HIGH (ref 70–99)

## 2024-02-28 LAB — CBG MONITORING, ED: Glucose-Capillary: 127 mg/dL — ABNORMAL HIGH (ref 70–99)

## 2024-02-28 LAB — LIPASE, BLOOD: Lipase: 32 U/L (ref 11–51)

## 2024-02-28 LAB — HIV ANTIBODY (ROUTINE TESTING W REFLEX): HIV Screen 4th Generation wRfx: NONREACTIVE

## 2024-02-28 LAB — HCG, SERUM, QUALITATIVE: Preg, Serum: NEGATIVE

## 2024-02-28 MED ORDER — DICYCLOMINE HCL 10 MG/ML IM SOLN
20.0000 mg | Freq: Once | INTRAMUSCULAR | Status: AC
  Administered 2024-02-28: 20 mg via INTRAMUSCULAR
  Filled 2024-02-28: qty 2

## 2024-02-28 MED ORDER — HYDROMORPHONE HCL 1 MG/ML IJ SOLN
0.5000 mg | INTRAMUSCULAR | Status: DC | PRN
  Administered 2024-02-28 – 2024-03-03 (×26): 1 mg via INTRAVENOUS
  Filled 2024-02-28 (×26): qty 1

## 2024-02-28 MED ORDER — FENTANYL CITRATE PF 50 MCG/ML IJ SOSY
50.0000 ug | PREFILLED_SYRINGE | Freq: Once | INTRAMUSCULAR | Status: AC
  Administered 2024-02-28: 50 ug via INTRAVENOUS
  Filled 2024-02-28: qty 1

## 2024-02-28 MED ORDER — FAMOTIDINE IN NACL 20-0.9 MG/50ML-% IV SOLN
20.0000 mg | Freq: Once | INTRAVENOUS | Status: AC
  Administered 2024-02-28: 20 mg via INTRAVENOUS
  Filled 2024-02-28: qty 50

## 2024-02-28 MED ORDER — IOHEXOL 300 MG/ML  SOLN
125.0000 mL | Freq: Once | INTRAMUSCULAR | Status: AC | PRN
  Administered 2024-02-28: 125 mL via INTRAVENOUS

## 2024-02-28 MED ORDER — ENOXAPARIN SODIUM 40 MG/0.4ML IJ SOSY
40.0000 mg | PREFILLED_SYRINGE | INTRAMUSCULAR | Status: DC
  Administered 2024-02-28 – 2024-03-03 (×5): 40 mg via SUBCUTANEOUS
  Filled 2024-02-28 (×5): qty 0.4

## 2024-02-28 MED ORDER — PROCHLORPERAZINE EDISYLATE 10 MG/2ML IJ SOLN
5.0000 mg | Freq: Four times a day (QID) | INTRAMUSCULAR | Status: DC | PRN
  Administered 2024-02-28 – 2024-03-03 (×10): 10 mg via INTRAVENOUS
  Filled 2024-02-28 (×11): qty 2

## 2024-02-28 MED ORDER — POTASSIUM CHLORIDE IN NACL 20-0.9 MEQ/L-% IV SOLN
INTRAVENOUS | Status: AC
  Filled 2024-02-28 (×4): qty 1000

## 2024-02-28 MED ORDER — HYDROMORPHONE HCL 1 MG/ML IJ SOLN
1.0000 mg | INTRAMUSCULAR | Status: DC | PRN
  Administered 2024-02-28: 1 mg via INTRAVENOUS
  Filled 2024-02-28: qty 1

## 2024-02-28 MED ORDER — METHOCARBAMOL 1000 MG/10ML IJ SOLN: 500.0000 mg | Freq: Three times a day (TID) | INTRAMUSCULAR | Status: DC | PRN

## 2024-02-28 MED ORDER — KETOROLAC TROMETHAMINE 15 MG/ML IJ SOLN
15.0000 mg | Freq: Once | INTRAMUSCULAR | Status: AC
  Administered 2024-02-28: 15 mg via INTRAVENOUS
  Filled 2024-02-28: qty 1

## 2024-02-28 MED ORDER — DIATRIZOATE MEGLUMINE & SODIUM 66-10 % PO SOLN
90.0000 mL | Freq: Once | ORAL | Status: AC
  Administered 2024-02-28: 90 mL via ORAL
  Filled 2024-02-28: qty 90

## 2024-02-28 MED ORDER — ONDANSETRON 4 MG PO TBDP: 4.0000 mg | ORAL_TABLET | Freq: Once | ORAL | Status: DC | PRN

## 2024-02-28 MED ORDER — DIPHENHYDRAMINE HCL 50 MG/ML IJ SOLN: 25.0000 mg | Freq: Four times a day (QID) | INTRAMUSCULAR | Status: DC | PRN

## 2024-02-28 MED ORDER — ONDANSETRON HCL 4 MG/2ML IJ SOLN
4.0000 mg | Freq: Once | INTRAMUSCULAR | Status: AC
  Administered 2024-02-28: 4 mg via INTRAVENOUS
  Filled 2024-02-28: qty 2

## 2024-02-28 MED ORDER — LACTATED RINGERS IV BOLUS
1000.0000 mL | Freq: Once | INTRAVENOUS | Status: AC
  Administered 2024-02-28: 1000 mL via INTRAVENOUS

## 2024-02-28 MED ORDER — INSULIN ASPART 100 UNIT/ML IJ SOLN
0.0000 [IU] | Freq: Three times a day (TID) | INTRAMUSCULAR | Status: DC
  Administered 2024-02-28 (×2): 2 [IU] via SUBCUTANEOUS
  Administered 2024-02-29 (×2): 3 [IU] via SUBCUTANEOUS
  Administered 2024-02-29 – 2024-03-02 (×4): 2 [IU] via SUBCUTANEOUS
  Filled 2024-02-28: qty 0.15

## 2024-02-28 MED ORDER — PROCHLORPERAZINE MALEATE 10 MG PO TABS: 10.0000 mg | ORAL_TABLET | Freq: Four times a day (QID) | ORAL | Status: DC | PRN

## 2024-02-28 MED ORDER — SODIUM CHLORIDE 0.9 % IV SOLN: Freq: Once | INTRAVENOUS | Status: AC

## 2024-02-28 MED ORDER — METHOCARBAMOL 500 MG PO TABS
500.0000 mg | ORAL_TABLET | Freq: Three times a day (TID) | ORAL | Status: DC | PRN
  Administered 2024-03-01 – 2024-03-03 (×3): 500 mg via ORAL
  Filled 2024-02-28 (×3): qty 1

## 2024-02-28 MED ORDER — DIPHENHYDRAMINE HCL 25 MG PO CAPS: 25.0000 mg | ORAL_CAPSULE | Freq: Four times a day (QID) | ORAL | Status: DC | PRN

## 2024-02-28 MED ORDER — HYDROMORPHONE HCL 1 MG/ML IJ SOLN
1.0000 mg | Freq: Once | INTRAMUSCULAR | Status: AC
  Administered 2024-02-28: 1 mg via INTRAVENOUS
  Filled 2024-02-28: qty 1

## 2024-02-28 MED ORDER — METOPROLOL TARTRATE 5 MG/5ML IV SOLN
5.0000 mg | Freq: Four times a day (QID) | INTRAVENOUS | Status: DC | PRN
  Administered 2024-02-29 – 2024-03-01 (×3): 5 mg via INTRAVENOUS
  Filled 2024-02-28 (×3): qty 5

## 2024-02-28 MED ORDER — DROPERIDOL 2.5 MG/ML IJ SOLN
1.2500 mg | Freq: Once | INTRAMUSCULAR | Status: AC
  Administered 2024-02-28: 1.25 mg via INTRAVENOUS
  Filled 2024-02-28: qty 2

## 2024-02-28 MED ORDER — SODIUM CHLORIDE 0.9 % IV SOLN
8.0000 mg | Freq: Three times a day (TID) | INTRAVENOUS | Status: DC | PRN
  Administered 2024-02-28 – 2024-03-03 (×4): 8 mg via INTRAVENOUS
  Filled 2024-02-28 (×6): qty 4

## 2024-02-28 NOTE — ED Triage Notes (Signed)
 Pt c/o abd pain and vomiting. She has gastric sleeve in October 2024. Pt states that her stomach started hurting after having some egg for breakfast, and it has not resolved all day. Pt says she has vomited throughout the day, estimated 5 episodes. No diarrhea, chills, fevers, body aches. Pt states she has LUQ/epigastric pain constant, rated 8/10, "twisting and cramping."

## 2024-02-28 NOTE — ED Provider Notes (Addendum)
 53 year old female [facility].  Bariatric surgery patient, partial small bowel obstruction.  Not currently vomiting.  Spoke with on-call general surgeon Dr. Magnus Ivan.  Day team is going to evaluate the patient.  Analgesia and antiemetics for the patient in the ED.  Patient admitted to surgical service.   Anders Simmonds T, DO 02/28/24 [id number]    Anders Simmonds T, DO 02/28/24 [phone]

## 2024-02-28 NOTE — ED Notes (Signed)
Report given to Mara, RN ?

## 2024-02-28 NOTE — ED Notes (Signed)
 Report given to [employer] at [employer], [facility]link at bedside

## 2024-02-28 NOTE — Plan of Care (Signed)

## 2024-02-28 NOTE — H&P (Signed)
 Admission Note  Shelly Larson 1971-08-13  [id number].    Requesting MD: Dr. Nicanor Alcon Chief Complaint/Reason for Consult: SBO  HPI:  53 y.o. female with medical history significant for colon cancer s/p partial colectomy, GERD, HTN, migraine, T2DM not on medication who presented to [facility] ED with abdominal pain, nausea, vomiting. She underwent laparoscopic sleeve gastrectomy with LOA by Dr. Andrey Campanile 09/2023 and had an uncomplicated post operative course with pain significantly improved. Yesterday she was eating an egg in the morning and developed acute severe abdominal pain in epigastrium and left upper quadrant. She developed associated nausea and vomiting and presented to ED for further evaluation. She takes linzess at baseline with bowel movements usually daily. Last BM 2 days ago and she has not been passing flatus in last 24 hours. She has had sick contacts at work with GI complaints.   Work up in ED concerning for SBO and patient transferred to [facility] ED for surgical evaluation.   This am pain is improved to 6/10 and nausea improved. No emesis today. She has HTN at baseline and has not taken her medications this am. She states she has worsening HTN with acute pain.  She is accompanied by her spouse and niece  Substance use: occ etoh use Allergies: lisinopril - cough Blood thinners: none Past Surgeries: laparoscopic cholecystectomy, lap gastric sleeve, lap partial colectomy   ROS: Reviewed and as above  Family History  Problem Relation Age of Onset   Hypertension Mother    Diabetes Mellitus II Mother    Heart disease Sister        dx in late 66's   Breast cancer Paternal Grandmother    Breast cancer Paternal Aunt    Colon cancer Neg Hx    Liver disease Neg Hx     Past Medical History:  Diagnosis Date   ADHD    Allergies    Anemia    Anxiety    Arthritis    Asthma    Biliary acute pancreatitis 2010   Colon cancer (HCC) 2016   Transverse colon    Diabetes mellitus  without complication (HCC)    Dyspnea    GERD (gastroesophageal reflux disease)    Hypertension    Hypertension    Migraine    Pneumonia     Past Surgical History:  Procedure Laterality Date   ABLATION     BAND HEMORRHOIDECTOMY     BIOPSY  09/02/2021   Procedure: BIOPSY;  Surgeon: Lanelle Bal, DO;  [facility];  Service: Endoscopy;;   BREAST REDUCTION SURGERY Bilateral    CHOLECYSTECTOMY  2010   [place], biliary pancreatitis   COLECTOMY  09/30/2015   distal transverse d/t adenocarcinoma   COLONOSCOPY N/A 06/11/2018   Procedure: COLONOSCOPY;  Surgeon: Corbin Ade, MD;  [facility]: [facility];  Service: Endoscopy;  Laterality: N/A;  1:45pm   COLONOSCOPY WITH PROPOFOL N/A 04/02/2023   Procedure: COLONOSCOPY WITH PROPOFOL;  Surgeon: Corbin Ade, MD;  [facility]: [facility];  Service: Endoscopy;  Laterality: N/A;  12:15 pm   DILITATION & CURRETTAGE/HYSTROSCOPY WITH NOVASURE ABLATION N/A 09/10/2018   Procedure: DILATATION & CURETTAGE/HYSTEROSCOPY WITH MINERVA  ENDOMETRIAL ABLATION;  Surgeon: Tilda Burrow, MD;  Location: [facility];  Service: Gynecology;  Laterality: N/A;   ESOPHAGOGASTRODUODENOSCOPY (EGD) WITH PROPOFOL N/A 09/02/2021   Procedure: ESOPHAGOGASTRODUODENOSCOPY (EGD) WITH PROPOFOL;  Surgeon: Lanelle Bal, DO;  [facility];  Service: Endoscopy;  Laterality: N/A;  GASTRIC ROUX-EN-Y N/A 10/15/2023   Procedure: LAPAROSCOPIC GASTRIC SLEEVE AND LYSIS OF ADHESIONS WITH UPPER ENDOSCOPY;  Surgeon: Gaynelle Adu, MD;  Location: [facility];  Service: General;  Laterality: N/A;   HEMORRHOID SURGERY N/A 07/04/2023   Procedure: EXTENSIVE HEMORRHOIDECTOMY;  Surgeon: Lucretia Roers, MD;  Location: [facility];  Service: General;  Laterality: N/A;   POLYPECTOMY  06/11/2018   Procedure: POLYPECTOMY;  Surgeon: Corbin Ade, MD;  [facility];  Service: Endoscopy;;  colon   POLYPECTOMY  04/02/2023   Procedure: POLYPECTOMY;  Surgeon: Corbin Ade, MD;  [facility];  Service: Endoscopy;;    Social History:  reports that she has never smoked. She has never been exposed to tobacco smoke. She has never used smokeless tobacco. She reports current alcohol use. She reports that she does not use drugs.  Allergies:  Allergies  Allergen Reactions   Lisinopril Cough    (Not in a hospital admission)   Blood pressure (!) 131/111, pulse 65, temperature 98 F (36.7 C), resp. rate 15, height 5\' 11"  (1.803 m), weight 104.6 kg, SpO2 98%. Physical Exam: General: pleasant, WD, female who is laying in bed in NAD HEENT: head is normocephalic, atraumatic.  Sclera are noninjected.  Pupils equal and round. EOMs intact.  Ears and nose without any masses or lesions.  Mouth is pink and moist Heart: regular, rate, and rhythm.   Lungs: respiratory effort nonlabored on room air Abd: soft, mild distension. Moderate TTP epigastrium and LUQ with voluntary guarding  MSK: all 4 extremities are symmetrical with no cyanosis, clubbing, or edema. Skin: warm and dry with no masses, lesions, or rashes Neuro: Cranial nerves 2-12 grossly intact, sensation is normal throughout Psych: A&Ox3 with an appropriate affect.    Results for orders placed or performed during the hospital encounter of 02/28/24 (from the past 48 hours)  Lipase, blood     Status: None   Collection Time: 02/28/24 12:13 AM  Result Value Ref Range   Lipase 32 11 - 51 U/L    Comment: Performed at [facility], [address] [place], [place] [address]  Comprehensive metabolic panel     Status: Abnormal   Collection Time: 02/28/24 12:13 AM  Result Value Ref Range   Sodium 137 135 - 145 mmol/L   Potassium 3.5 3.5 - 5.1 mmol/L   Chloride 101 98 - 111 mmol/L   CO2 25 22 - 32 mmol/L   Glucose, Bld 147 (H) 70 - 99 mg/dL    Comment: Glucose reference range applies only to samples taken after fasting for at least 8 hours.   BUN 16 6 - 20 mg/dL   Creatinine, Ser 1.91 (H) 0.44  - 1.00 mg/dL   Calcium 47.8 8.9 - 29.5 mg/dL   Total Protein 8.7 (H) 6.5 - 8.1 g/dL   Albumin 4.6 3.5 - 5.0 g/dL   AST 15 15 - 41 U/L   ALT 15 0 - 44 U/L   Alkaline Phosphatase 61 38 - 126 U/L   Total Bilirubin 0.8 0.0 - 1.2 mg/dL   GFR, Estimated 54 (L) >60 mL/min    Comment: (NOTE) Calculated using the CKD-EPI Creatinine Equation (2021)    Anion gap 11 5 - 15    Comment: Performed at [facility], [address] [place], [place] [address]  CBC     Status: Abnormal   Collection Time: 02/28/24 12:13 AM  Result Value Ref Range   WBC  8.0 4.0 - 10.5 K/uL    Comment: REPEATED TO VERIFY WHITE COUNT CONFIRMED ON SMEAR    RBC 5.80 (H) 3.87 - 5.11 MIL/uL   Hemoglobin 15.9 (H) 12.0 - 15.0 g/dL   HCT 40.9 (H) 81.1 - 91.4 %   MCV 84.0 80.0 - 100.0 fL   MCH 27.4 26.0 - 34.0 pg   MCHC 32.6 30.0 - 36.0 g/dL   RDW 78.2 95.6 - 21.3 %   Platelets 186 150 - 400 K/uL    Comment: REPEATED TO VERIFY   nRBC 0.0 0.0 - 0.2 %    Comment: Performed at [facility], [address] [place], [place] [address]  hCG, serum, qualitative     Status: None   Collection Time: 02/28/24 12:52 AM  Result Value Ref Range   Preg, Serum NEGATIVE NEGATIVE    Comment:        THE SENSITIVITY OF THIS METHODOLOGY IS >10 mIU/mL. Performed at [facility], [address] [place], [place] [address]    CT ABDOMEN PELVIS W CONTRAST Result Date: 02/28/2024 CLINICAL DATA:  Epigastric pain and vomiting, initial encounter EXAM: CT ABDOMEN AND PELVIS WITH CONTRAST TECHNIQUE: Multidetector CT imaging of the abdomen and pelvis was performed using the standard protocol following bolus administration of intravenous contrast. RADIATION DOSE REDUCTION: This exam was performed according to the departmental dose-optimization program which includes automated exposure control, adjustment of the mA and/or kV according to patient size and/or use of iterative reconstruction technique. CONTRAST:   OMNIPAQUE IOHEXOL 300 MG/ML  SOLN COMPARISON:  07/16/2023 FINDINGS: Lower chest: No acute abnormality. Hepatobiliary: No focal liver abnormality is seen. Status post cholecystectomy. No biliary dilatation. Pancreas: Unremarkable. No pancreatic ductal dilatation or surrounding inflammatory changes. Spleen: Normal in size without focal abnormality. Adrenals/Urinary Tract: Adrenal glands are within normal limits. Kidneys demonstrate a normal enhancement pattern bilaterally. No renal calculi are seen. The ureters are within normal limits. Stomach/Bowel: No obstructive or inflammatory changes of the colon are seen. The appendix is within normal limits. 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 Mildly dilated loops of jejunum are identified in the left mid abdomen. Some mildly inflamed loops of small bowel are noted within the obstructive change although a transition zone is noted on image number 111 of series 601 in the left mid abdomen. The more distal small bowel is within normal limits. Vascular/Lymphatic: No significant vascular findings are present. No enlarged abdominal or pelvic lymph nodes. Reproductive: Uterus is somewhat bulky consistent with uterine fibroid change. Small simple cysts are noted bilaterally. No follow-up is recommended. Other: No abdominal wall hernia or abnormality. No abdominopelvic ascites. Musculoskeletal: No acute or significant osseous findings. IMPRESSION: Mild partial small bowel obstruction in the jejunum secondary to a transition zone in the left mid abdomen as described. This is likely related to adhesions as no focal mass is seen. Uterine fibroid change. Electronically Signed   By: Alcide Clever M.D.   On: 02/28/2024 02:07      Assessment/Plan pSBO H/o lap sleeve gastrectomy with LOA for 30 min 10/15/23 Dr. Andrey Campanile  - CT w/ Mild partial small bowel obstruction in the jejunum secondary to a transition zone in the left mid abdomen. There is some  inflammation of loops of SB  - No current indication for emergency surgery - Not currently n/v and history of sleeve. No NGT - Start SBO protocol PO - Keep K > 4 and Mg > 2 for bowel function -  Mobilize for bowel function - Hopefully patient will improve with conservative management. If patient fails to improve with conservative management, she may require exploratory surgery during admission  FEN: NPO, IVF @ 125 ml/hr ID: none indicated VTE: SCDs. lovenox  HTN - on multiple PO meds at baseline. States she has had elevated diastolic BP specifically in the past and that it worsens with pain. PRN IV metoprolol T2DM - SSI AKI - received 1L bolus in ED. Continue IVF at 125 ml/hr today  I reviewed ED provider notes, last 24 h vitals and pain scores, last 48 h intake and output, last 24 h labs and trends, and last 24 h imaging results.   Eric Form, [facility] 02/28/2024, 7:59 AM Please see Amion for pager number during day hours 7:00am-4:30pm

## 2024-02-28 NOTE — ED Provider Notes (Signed)
 [place] [facility] AT [facility] [place] Provider Note   CSN: [id number] Arrival date & time: 02/28/24  0003     History  No chief complaint on file.   Shelly Larson is a 53 y.o. female.  The history is provided by the patient.  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 Andrey Campanile.  Patient presents with nausea, vomiting and LUQ pain.  No diarrhea or constipation.  No fevers.  She has had several sick contacts at work.  No fevers.      Past Medical History:  Diagnosis Date   ADHD    Allergies    Anemia    Anxiety    Arthritis    Asthma    Biliary acute pancreatitis 2010   Colon cancer (HCC) 2016   Transverse colon    Diabetes mellitus without complication (HCC)    Dyspnea    GERD (gastroesophageal reflux disease)    Hypertension    Hypertension    Migraine    Pneumonia     Home Medications Prior to Admission medications   Medication Sig Start Date End Date Taking? Authorizing Provider  albuterol (VENTOLIN HFA) 108 (90 Base) MCG/ACT inhaler Inhale 2 puffs into the lungs every 4 (four) hours as needed for wheezing or shortness of breath.  10/17/17   [provider]  amitriptyline (ELAVIL) 25 MG tablet Take 3 tablets (75 mg total) by mouth at bedtime. 05/03/23 05/02/24  Antony Madura, MD  amLODipine (NORVASC) 10 MG tablet TAKE 1 TABLET BY MOUTH EVERY DAY 06/04/23   Allwardt, Alyssa M, PA-C  amphetamine-dextroamphetamine (ADDERALL XR) 30 MG 24 hr capsule Take 1 capsule (30 mg total) by mouth  every morning. 07/08/23   Allwardt, Alyssa M, PA-C  amphetamine-dextroamphetamine (ADDERALL) 15 MG tablet Take 1 tab po around 2 pm Patient taking differently: Take 15 mg by mouth daily as needed (in the afternoon as needed for focus/attention.). 10/09/22   Allwardt, Crist Infante, PA-C  clobetasol ointment (TEMOVATE) 0.05 % Apply 1 Application topically 2 (two) times daily. AFTER 2 WEEKS THEN STOP 11/19/23   Terri Piedra, DO  clonazePAM (KLONOPIN) 0.5 MG tablet Take 1 tablet (0.5 mg total) by mouth 2 (two) times daily. 12/14/23   Allwardt, Crist Infante, PA-C  Erenumab-aooe (AIMOVIG) 140 MG/ML SOAJ Inject 140 mg into the skin every 28 (twenty-eight) days. 11/07/23   Antony Madura, MD  gabapentin (NEURONTIN) 100 MG capsule Take 1 capsule (100 mg total) by mouth every 12 (twelve) hours for 5 days. Patient not taking: Reported on 11/19/2023 10/16/23 11/07/23  Gaynelle Adu, MD  hydrocortisone (ANUSOL-HC) 2.5 % rectal cream Place 1 Application rectally 2 (two) times daily. Patient taking differently: Place 1 Application rectally 2 (two) times daily as needed for hemorrhoids or anal itching. 07/04/23   Geoffery Lyons, MD  LINZESS 290 MCG CAPS capsule TAKE 1 CAPSULE BY MOUTH DAILY BEFORE BREAKFAST. 11/05/23   Aida Raider, NP  losartan-hydrochlorothiazide (HYZAAR) 100-25 MG tablet TAKE 1 TABLET BY MOUTH EVERY DAY 10/08/23   Allwardt,  Alyssa M, PA-C  methylPREDNISolone (MEDROL DOSEPAK) 4 MG TBPK tablet Take 6 tablets for 1 day, then 5 tablets for 1 day, then 4 tablets for 1 day, then 3 tablets for 1 day, then 2 tablets for 1 day, then 1 tablet for 1 day, then STOP Patient not taking: Reported on 11/19/2023 11/07/23   Antony Madura, MD  metoprolol succinate (TOPROL-XL) 50 MG 24 hr tablet Take 50 mg by mouth every morning.  09/19/17   [provider]  omeprazole (PRILOSEC) 40 MG capsule Take 1 capsule (40 mg total) by mouth daily. Patient taking differently: Take 40 mg by mouth every evening. 03/19/23    Aida Raider, NP  oxyCODONE (OXY IR/ROXICODONE) 5 MG immediate release tablet Take 1 tablet (5 mg total) by mouth every 6 (six) hours as needed for breakthrough pain or severe pain (pain score 7-10). Patient not taking: Reported on 11/07/2023 10/16/23   Gaynelle Adu, MD  promethazine (PHENERGAN) 12.5 MG tablet Take 1 tablet (12.5 mg total) by mouth every 6 (six) hours as needed for nausea or vomiting. 11/07/23   Antony Madura, MD  Rimegepant Sulfate (NURTEC) 75 MG TBDP Take 1 tablet (75 mg total) by mouth daily as needed. 11/01/23   Antony Madura, MD  Safety Seal Miscellaneous MISC Apply 1 Application topically at bedtime. Medication Name: Melaxemic Cream (Tranexamic Acid, Kojic Acid, Vit C, Hydrocortisone,Hyaluronic Acid) 11/19/23   Terri Piedra, DO  sucralfate (CARAFATE) 1 GM/10ML suspension Take by mouth. 11/15/23 12/06/23  [provider]  sulfamethoxazole-trimethoprim (BACTRIM DS) 800-160 MG tablet Take 1 tablet by mouth 2 (two) times daily. Patient not taking: Reported on 11/19/2023 10/29/23   Lula Olszewski, MD  Zavegepant HCl (ZAVZPRET) 10 MG/ACT SOLN Place 10 mg into the nose as needed (for migraine). 11/07/23   Antony Madura, MD      Allergies    Lisinopril    Review of Systems   Review of Systems  Constitutional:  Negative for fever.  HENT:  Negative for facial swelling.   Respiratory:  Negative for wheezing and stridor.   Gastrointestinal:  Positive for abdominal pain, nausea and vomiting.  Genitourinary:  Negative for dysuria.  All other systems reviewed and are negative.   Physical Exam Updated Vital Signs BP (!) 145/103 (BP Location: Left Arm)   Pulse 80   Temp 97.7 F (36.5 C)   Resp 18   Ht 5\' 11"  (1.803 m)   Wt 104.6 kg   SpO2 100%   BMI 32.15 kg/m  Physical Exam Vitals and nursing note reviewed.  Constitutional:      General: She is not in acute distress.    Appearance: Normal appearance. She is well-developed.  HENT:     Head:  Normocephalic and atraumatic.     Nose: Nose normal.  Eyes:     Pupils: Pupils are equal, round, and reactive to light.  Cardiovascular:     Rate and Rhythm: Normal rate and regular rhythm.     Pulses: Normal pulses.     Heart sounds: Normal heart sounds.  Pulmonary:     Effort: Pulmonary effort is normal. No respiratory distress.     Breath sounds: Normal breath sounds.  Abdominal:     General: There is no distension.     Palpations: Abdomen is soft.     Tenderness: There is abdominal tenderness. There is no guarding or rebound.     Comments: Hyperactive BS LUQ  Musculoskeletal:  General: Normal range of motion.     Cervical back: Normal range of motion and neck supple.  Skin:    General: Skin is warm and dry.     Capillary Refill: Capillary refill takes less than 2 seconds.     Findings: No erythema or rash.  Neurological:     General: No focal deficit present.     Mental Status: She is alert and oriented to person, place, and time.     Deep Tendon Reflexes: Reflexes normal.  Psychiatric:        Mood and Affect: Mood normal.     ED Results / Procedures / Treatments   Labs (all labs ordered are listed, but only abnormal results are displayed) Results for orders placed or performed during the hospital encounter of 02/28/24  Lipase, blood   Collection Time: 02/28/24 12:13 AM  Result Value Ref Range   Lipase 32 11 - 51 U/L  Comprehensive metabolic panel   Collection Time: 02/28/24 12:13 AM  Result Value Ref Range   Sodium 137 135 - 145 mmol/L   Potassium 3.5 3.5 - 5.1 mmol/L   Chloride 101 98 - 111 mmol/L   CO2 25 22 - 32 mmol/L   Glucose, Bld 147 (H) 70 - 99 mg/dL   BUN 16 6 - 20 mg/dL   Creatinine, Ser 0.98 (H) 0.44 - 1.00 mg/dL   Calcium 11.9 8.9 - 14.7 mg/dL   Total Protein 8.7 (H) 6.5 - 8.1 g/dL   Albumin 4.6 3.5 - 5.0 g/dL   AST 15 15 - 41 U/L   ALT 15 0 - 44 U/L   Alkaline Phosphatase 61 38 - 126 U/L   Total Bilirubin 0.8 0.0 - 1.2 mg/dL   GFR,  Estimated 54 (L) >60 mL/min   Anion gap 11 5 - 15  CBC   Collection Time: 02/28/24 12:13 AM  Result Value Ref Range   WBC 8.0 4.0 - 10.5 K/uL   RBC 5.80 (H) 3.87 - 5.11 MIL/uL   Hemoglobin 15.9 (H) 12.0 - 15.0 g/dL   HCT 82.9 (H) 56.2 - 13.0 %   MCV 84.0 80.0 - 100.0 fL   MCH 27.4 26.0 - 34.0 pg   MCHC 32.6 30.0 - 36.0 g/dL   RDW 86.5 78.4 - 69.6 %   Platelets 186 150 - 400 K/uL   nRBC 0.0 0.0 - 0.2 %  hCG, serum, qualitative   Collection Time: 02/28/24 12:52 AM  Result Value Ref Range   Preg, Serum NEGATIVE NEGATIVE   CT ABDOMEN PELVIS W CONTRAST Result Date: 02/28/2024 CLINICAL DATA:  Epigastric pain and vomiting, initial encounter EXAM: CT ABDOMEN AND PELVIS WITH CONTRAST TECHNIQUE: Multidetector CT imaging of the abdomen and pelvis was performed using the standard protocol following bolus administration of intravenous contrast. RADIATION DOSE REDUCTION: This exam was performed according to the departmental dose-optimization program which includes automated exposure control, adjustment of the mA and/or kV according to patient size and/or use of iterative reconstruction technique. CONTRAST:  OMNIPAQUE IOHEXOL 300 MG/ML  SOLN COMPARISON:  07/16/2023 FINDINGS: Lower chest: No acute abnormality. Hepatobiliary: No focal liver abnormality is seen. Status post cholecystectomy. No biliary dilatation. Pancreas: Unremarkable. No pancreatic ductal dilatation or surrounding inflammatory changes. Spleen: Normal in size without focal abnormality. Adrenals/Urinary Tract: Adrenal glands are within normal limits. Kidneys demonstrate a normal enhancement pattern bilaterally. No renal calculi are seen. The ureters are within normal limits. Stomach/Bowel: No obstructive or inflammatory changes of the colon are seen. The appendix  is within normal limits. 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 Mildly dilated loops of jejunum are identified in the left mid abdomen. Some mildly  inflamed loops of small bowel are noted within the obstructive change although a transition zone is noted on image number 111 of series 601 in the left mid abdomen. The more distal small bowel is within normal limits. Vascular/Lymphatic: No significant vascular findings are present. No enlarged abdominal or pelvic lymph nodes. Reproductive: Uterus is somewhat bulky consistent with uterine fibroid change. Small simple cysts are noted bilaterally. No follow-up is recommended. Other: No abdominal wall hernia or abnormality. No abdominopelvic ascites. Musculoskeletal: No acute or significant osseous findings. IMPRESSION: Mild partial small bowel obstruction in the jejunum secondary to a transition zone in the left mid abdomen as described. This is likely related to adhesions as no focal mass is seen. Uterine fibroid change. Electronically Signed   By: Alcide Clever M.D.   On: 02/28/2024 02:07     EKG EKG Interpretation Date/Time:  Thursday February 28 2024 00:25:47 EST Ventricular Rate:  69 PR Interval:  192 QRS Duration:  80 QT Interval:  426 QTC Calculation: 457 R Axis:   10  Text Interpretation: Sinus rhythm LVH with secondary repolarization abnormality Confirmed by Nicanor Alcon, Shamere Dilworth ([id number]) on 02/28/2024 12:30:32 AM  Radiology CT ABDOMEN PELVIS W CONTRAST Result Date: 02/28/2024 CLINICAL DATA:  Epigastric pain and vomiting, initial encounter EXAM: CT ABDOMEN AND PELVIS WITH CONTRAST TECHNIQUE: Multidetector CT imaging of the abdomen and pelvis was performed using the standard protocol following bolus administration of intravenous contrast. RADIATION DOSE REDUCTION: This exam was performed according to the departmental dose-optimization program which includes automated exposure control, adjustment of the mA and/or kV according to patient size and/or use of iterative reconstruction technique. CONTRAST:  OMNIPAQUE IOHEXOL 300 MG/ML  SOLN COMPARISON:  07/16/2023 FINDINGS: Lower chest: No acute abnormality.  Hepatobiliary: No focal liver abnormality is seen. Status post cholecystectomy. No biliary dilatation. Pancreas: Unremarkable. No pancreatic ductal dilatation or surrounding inflammatory changes. Spleen: Normal in size without focal abnormality. Adrenals/Urinary Tract: Adrenal glands are within normal limits. Kidneys demonstrate a normal enhancement pattern bilaterally. No renal calculi are seen. The ureters are within normal limits. Stomach/Bowel: No obstructive or inflammatory changes of the colon are seen. The appendix is within normal limits. 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 Mildly dilated loops of jejunum are identified in the left mid abdomen. Some mildly inflamed loops of small bowel are noted within the obstructive change although a transition zone is noted on image number 111 of series 601 in the left mid abdomen. The more distal small bowel is within normal limits. Vascular/Lymphatic: No significant vascular findings are present. No enlarged abdominal or pelvic lymph nodes. Reproductive: Uterus is somewhat bulky consistent with uterine fibroid change. Small simple cysts are noted bilaterally. No follow-up is recommended. Other: No abdominal wall hernia or abnormality. No abdominopelvic ascites. Musculoskeletal: No acute or significant osseous findings. IMPRESSION: Mild partial small bowel obstruction in the jejunum secondary to a transition zone in the left mid abdomen as described. This is likely related to adhesions as no focal mass is seen. Uterine fibroid change. Electronically Signed   By: Alcide Clever M.D.   On: 02/28/2024 02:07    Procedures Procedures    Medications Ordered in ED Medications  ondansetron (ZOFRAN-ODT) disintegrating tablet 4 mg (has no administration in time range)  droperidol (INAPSINE) 2.5 MG/ML injection 1.25 mg (1.25 mg Intravenous Given 02/28/24 0044)  ketorolac (  TORADOL) 15 MG/ML injection 15 mg (15 mg Intravenous Given 02/28/24 0108)   dicyclomine (BENTYL) injection 20 mg (20 mg Intramuscular Given 02/28/24 0111)  famotidine (PEPCID) IVPB 20 mg premix (0 mg Intravenous Stopped 02/28/24 0215)  iohexol (OMNIPAQUE) 300 MG/ML solution 125 mL (125 mLs Intravenous Contrast Given 02/28/24 0138)  fentaNYL (SUBLIMAZE) injection 50 mcg (50 mcg Intravenous Given 02/28/24 0219)  0.9 %  sodium chloride infusion ( Intravenous New Bag/Given 02/28/24 0233)    ED Course/ Medical Decision Making/ A&P                                 Medical Decision Making LUQ pain and emesis today, has had sick contacts   Amount and/or Complexity of Data Reviewed External Data Reviewed: labs and notes.    Details: Previous notes and labs reviewed  Labs: ordered.    Details: Normal lipase 32, normal white count 8, hemoglobin elevated 15.9, normal platelets 186, negative pregnancy test.  Normal sodium 137, normal potassium 3.5, creatinine slight elevation 1.22 normal LFTS Radiology: ordered.    Details: SBO on CT ECG/medicine tests: ordered and independent interpretation performed. Decision-making details documented in ED Course. Discussion of management or test interpretation with external provider(s): Case d/w Dr. Magnus Ivan, will need to be seen by surgery upon arrival.  No NG tube due to concern for internal hernia as is a bariatric surgery patient.  Please transfer ED to [facility] to be seen expeditiously.    Risk Prescription drug management. Parenteral controlled substances. Decision regarding hospitalization.    Final Clinical Impression(s) / ED Diagnoses Final diagnoses:  SBO (small bowel obstruction) (HCC)   Transfer to [facility] ED Rx / DC Orders ED Discharge Orders     None         Rogina Schiano, MD 02/28/24 7829

## 2024-02-28 NOTE — ED Notes (Signed)
 ED TO INPATIENT HANDOFF REPORT  Name/Age/Gender Shelly Larson 53 y.o. female  Code Status    Code Status Orders  (From admission, onward)           Start     Ordered   02/28/24 0836  Full code  Continuous       Question:  By:  Answer:  Other   02/28/24 0838           Code Status History     Date Active Date Inactive Code Status Order ID Comments User Context   02/28/2024 0356 02/28/2024 0838 Full Code [id number]  Abigail Miyamoto, MD ED   10/15/2023 1516 10/16/2023 2025 Full Code [id number]  Gaynelle Adu, MD Inpatient   08/31/2021 2142 09/11/2021 1739 Full Code [id number]  Frankey Shown, DO ED       Home/SNF/Other Home  Chief Complaint SBO (small bowel obstruction) (HCC) [K56.609] Hx SBO [Z87.19]  Level of Care/Admitting Diagnosis ED Disposition     ED Disposition  Admit   Condition  --   Comment  Hospital Area: [facility] [[id number]]  Level of Care: Med-Surg [16]  May admit patient to [facility] or [facility] if equivalent level of care is available:: No  Covid Evaluation: Asymptomatic - no recent exposure (last 10 days) testing not required  Diagnosis: Hx SBO [629528]  Admitting Physician: CCS, MD [[id number]]  Attending Physician: CCS, MD [[id number]]  Bed request comments: 3E  Certification:: I certify this patient will need inpatient services for at least 2 midnights  Expected Medical Readiness: 03/01/2024          Medical History Past Medical History:  Diagnosis Date   ADHD    Allergies    Anemia    Anxiety    Arthritis    Asthma    Biliary acute pancreatitis 2010   Colon cancer (HCC) 2016   Transverse colon    Diabetes mellitus without complication (HCC)    Dyspnea    GERD (gastroesophageal reflux disease)    Hypertension    Hypertension    Migraine    Pneumonia     Allergies Allergies  Allergen Reactions   Lisinopril Cough    IV Location/Drains/Wounds Patient Lines/Drains/Airways Status     Active  Line/Drains/Airways     Name Placement date Placement time Site Days   Peripheral IV 02/28/24 20 G Anterior;Distal;Right;Upper Arm 02/28/24  0044  Arm  less than 1   Incision - 1 Port 1: Left;Lateral 10/15/23  1120  -- 136   Incision - 6 Ports Abdomen 1: Right;Lateral 2: Right;Medial 3: Mid;Upper 4: Left;Medial;Upper Left;Medial;Lower 6: Left;Mid 10/15/23  1120  -- 136            Labs/Imaging Results for orders placed or performed during the hospital encounter of 02/28/24 (from the past 48 hours)  Lipase, blood     Status: None   Collection Time: 02/28/24 12:13 AM  Result Value Ref Range   Lipase 32 11 - 51 U/L    Comment: Performed at [facility], [address] [place], [place] [address]  Comprehensive metabolic panel     Status: Abnormal   Collection Time: 02/28/24 12:13 AM  Result Value Ref Range   Sodium 137 135 - 145 mmol/L   Potassium 3.5 3.5 - 5.1 mmol/L   Chloride 101 98 - 111 mmol/L   CO2 25 22 - 32 mmol/L   Glucose, Bld 147 (H) 70 - 99 mg/dL    Comment:  Glucose reference range applies only to samples taken after fasting for at least 8 hours.   BUN 16 6 - 20 mg/dL   Creatinine, Ser 1.61 (H) 0.44 - 1.00 mg/dL   Calcium 09.6 8.9 - 04.5 mg/dL   Total Protein 8.7 (H) 6.5 - 8.1 g/dL   Albumin 4.6 3.5 - 5.0 g/dL   AST 15 15 - 41 U/L   ALT 15 0 - 44 U/L   Alkaline Phosphatase 61 38 - 126 U/L   Total Bilirubin 0.8 0.0 - 1.2 mg/dL   GFR, Estimated 54 (L) >60 mL/min    Comment: (NOTE) Calculated using the CKD-EPI Creatinine Equation (2021)    Anion gap 11 5 - 15    Comment: Performed at [facility], [address] [place], [place] [address]  CBC     Status: Abnormal   Collection Time: 02/28/24 12:13 AM  Result Value Ref Range   WBC 8.0 4.0 - 10.5 K/uL    Comment: REPEATED TO VERIFY WHITE COUNT CONFIRMED ON SMEAR    RBC 5.80 (H) 3.87 - 5.11 MIL/uL   Hemoglobin 15.9 (H) 12.0 - 15.0 g/dL   HCT 19.1 (H) 47.8 - 29.5 %   MCV 84.0 80.0 - 100.0  fL   MCH 27.4 26.0 - 34.0 pg   MCHC 32.6 30.0 - 36.0 g/dL   RDW 62.1 30.8 - 65.7 %   Platelets 186 150 - 400 K/uL    Comment: REPEATED TO VERIFY   nRBC 0.0 0.0 - 0.2 %    Comment: Performed at [facility], [address] [place], [place] [address]  hCG, serum, qualitative     Status: None   Collection Time: 02/28/24 12:52 AM  Result Value Ref Range   Preg, Serum NEGATIVE NEGATIVE    Comment:        THE SENSITIVITY OF THIS METHODOLOGY IS >10 mIU/mL. Performed at [facility], [address] [place], [place] [address]   HIV Antibody (routine testing w rflx)     Status: None   Collection Time: 02/28/24  8:51 AM  Result Value Ref Range   HIV Screen 4th Generation wRfx Non Reactive Non Reactive    Comment: Performed at [facility], [address], [place], [place] [address]  CBC     Status: None   Collection Time: 02/28/24  8:51 AM  Result Value Ref Range   WBC 6.0 4.0 - 10.5 K/uL   RBC 4.68 3.87 - 5.11 MIL/uL   Hemoglobin 12.7 12.0 - 15.0 g/dL   HCT 40.1 02.7 - 25.3 %   MCV 88.7 80.0 - 100.0 fL   MCH 27.1 26.0 - 34.0 pg   MCHC 30.6 30.0 - 36.0 g/dL   RDW 66.4 40.3 - 47.4 %   Platelets 151 150 - 400 K/uL    Comment: SPECIMEN CHECKED FOR CLOTS CONSISTENT WITH PREVIOUS RESULT REPEATED TO VERIFY    nRBC 0.0 0.0 - 0.2 %    Comment: Performed at [facility], [address], [place], [place] [address]  Creatinine, serum     Status: Abnormal   Collection Time: 02/28/24  8:51 AM  Result Value Ref Range   Creatinine, Ser 1.20 (H) 0.44 - 1.00 mg/dL   GFR, Estimated 55 (L) >60 mL/min    Comment: (NOTE) Calculated using the CKD-EPI Creatinine Equation (2021) Performed at [facility], [address] [place], [place] [address]   CBG  monitoring, ED     Status: Abnormal   Collection Time: 02/28/24 12:34 PM  Result Value Ref Range   Glucose-Capillary 127 (H) 70 - 99 mg/dL    Comment: Glucose reference range  applies only to samples taken after fasting for at least 8 hours.   CT ABDOMEN PELVIS W CONTRAST Result Date: 02/28/2024 CLINICAL DATA:  Epigastric pain and vomiting, initial encounter EXAM: CT ABDOMEN AND PELVIS WITH CONTRAST TECHNIQUE: Multidetector CT imaging of the abdomen and pelvis was performed using the standard protocol following bolus administration of intravenous contrast. RADIATION DOSE REDUCTION: This exam was performed according to the departmental dose-optimization program which includes automated exposure control, adjustment of the mA and/or kV according to patient size and/or use of iterative reconstruction technique. CONTRAST:  OMNIPAQUE IOHEXOL 300 MG/ML  SOLN COMPARISON:  07/16/2023 FINDINGS: Lower chest: No acute abnormality. Hepatobiliary: No focal liver abnormality is seen. Status post cholecystectomy. No biliary dilatation. Pancreas: Unremarkable. No pancreatic ductal dilatation or surrounding inflammatory changes. Spleen: Normal in size without focal abnormality. Adrenals/Urinary Tract: Adrenal glands are within normal limits. Kidneys demonstrate a normal enhancement pattern bilaterally. No renal calculi are seen. The ureters are within normal limits. Stomach/Bowel: No obstructive or inflammatory changes of the colon are seen. The appendix is within normal limits. 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 Mildly dilated loops of jejunum are identified in the left mid abdomen. Some mildly inflamed loops of small bowel are noted within the obstructive change although a transition zone is noted on image number 111 of series 601 in the left mid abdomen. The more distal small bowel is within normal limits. Vascular/Lymphatic: No significant vascular findings are present. No enlarged abdominal or pelvic lymph nodes. Reproductive: Uterus is somewhat bulky consistent with uterine fibroid change. Small simple cysts are noted bilaterally. No follow-up is recommended.  Other: No abdominal wall hernia or abnormality. No abdominopelvic ascites. Musculoskeletal: No acute or significant osseous findings. IMPRESSION: Mild partial small bowel obstruction in the jejunum secondary to a transition zone in the left mid abdomen as described. This is likely related to adhesions as no focal mass is seen. Uterine fibroid change. Electronically Signed   By: Alcide Clever M.D.   On: 02/28/2024 02:07    Pending Labs Unresulted Labs (From admission, onward)     Start     Ordered   03/06/24 0500  Creatinine, serum  (enoxaparin (LOVENOX)    CrCl >/= 30 ml/min)  Weekly,   R     Comments: while on enoxaparin therapy    02/28/24 0838   02/29/24 0500  CBC  Tomorrow morning,   R        02/28/24 0838   02/29/24 0500  Magnesium  Tomorrow morning,   R        02/28/24 0838   02/29/24 0500  Basic metabolic panel  Tomorrow morning,   R        02/28/24 0838   02/28/24 0012  Urinalysis, Routine w reflex microscopic -Urine, Clean Catch  Once,   URGENT       Question Answer Comment  Specimen Source Urine, Clean Catch   Release to patient Immediate      02/28/24 0011            Vitals/Pain Today's Vitals   02/28/24 1151 02/28/24 1208 02/28/24 1314 02/28/24 1500  BP:  (!) 159/95  (!) 148/87  Pulse:  80  62  Resp:  13  11  Temp: 98.1 F (36.7 C)  TempSrc:      SpO2:  94%  93%  Weight:      Height:      PainSc:   7      Isolation Precautions No active isolations  Medications Medications  0.9 % NaCl with KCl 20 mEq/ L  infusion ( Intravenous New Bag/Given 02/28/24 0847)  HYDROmorphone (DILAUDID) injection 0.5-1 mg (1 mg Intravenous Given 02/28/24 1314)  enoxaparin (LOVENOX) injection 40 mg (40 mg Subcutaneous Given 02/28/24 0907)  methocarbamol (ROBAXIN) tablet 500 mg (has no administration in time range)    Or  methocarbamol (ROBAXIN) injection 500 mg (has no administration in time range)  diphenhydrAMINE (BENADRYL) capsule 25 mg (has no administration in time range)     Or  diphenhydrAMINE (BENADRYL) injection 25 mg (has no administration in time range)  prochlorperazine (COMPAZINE) tablet 10 mg ( Oral See Alternative 02/28/24 0907)    Or  prochlorperazine (COMPAZINE) injection 5-10 mg (10 mg Intravenous Given 02/28/24 0907)  metoprolol tartrate (LOPRESSOR) injection 5 mg (has no administration in time range)  diatrizoate meglumine-sodium (GASTROGRAFIN) 66-10 % solution 90 mL (has no administration in time range)  insulin aspart (novoLOG) injection 0-15 Units (2 Units Subcutaneous Given 02/28/24 1311)  droperidol (INAPSINE) 2.5 MG/ML injection 1.25 mg (1.25 mg Intravenous Given 02/28/24 0044)  ketorolac (TORADOL) 15 MG/ML injection 15 mg (15 mg Intravenous Given 02/28/24 0108)  dicyclomine (BENTYL) injection 20 mg (20 mg Intramuscular Given 02/28/24 0111)  famotidine (PEPCID) IVPB 20 mg premix (0 mg Intravenous Stopped 02/28/24 0215)  iohexol (OMNIPAQUE) 300 MG/ML solution 125 mL (125 mLs Intravenous Contrast Given 02/28/24 0138)  fentaNYL (SUBLIMAZE) injection 50 mcg (50 mcg Intravenous Given 02/28/24 0219)  0.9 %  sodium chloride infusion (0 mLs Intravenous Stopped 02/28/24 0411)  HYDROmorphone (DILAUDID) injection 1 mg (1 mg Intravenous Given 02/28/24 0358)  ondansetron (ZOFRAN) injection 4 mg (4 mg Intravenous Given 02/28/24 0358)  lactated ringers bolus 1,000 mL (0 mLs Intravenous Stopped 02/28/24 0856)    Mobility walks

## 2024-02-28 NOTE — ED Notes (Signed)
 Received call from [facility] in regards to transport, No Current ETA ED Nurse aware  Received call @ 02:30 am

## 2024-02-29 ENCOUNTER — Inpatient Hospital Stay (HOSPITAL_COMMUNITY)

## 2024-02-29 LAB — CBC
HCT: 48.4 % — ABNORMAL HIGH (ref 36.0–46.0)
Hemoglobin: 15.2 g/dL — ABNORMAL HIGH (ref 12.0–15.0)
MCH: 27.4 pg (ref 26.0–34.0)
MCHC: 31.4 g/dL (ref 30.0–36.0)
MCV: 87.4 fL (ref 80.0–100.0)
Platelets: 175 10*3/uL (ref 150–400)
RBC: 5.54 MIL/uL — ABNORMAL HIGH (ref 3.87–5.11)
RDW: 14.3 % (ref 11.5–15.5)
WBC: 7.4 10*3/uL (ref 4.0–10.5)
nRBC: 0 % (ref 0.0–0.2)

## 2024-02-29 LAB — BASIC METABOLIC PANEL
Anion gap: 12 (ref 5–15)
BUN: 20 mg/dL (ref 6–20)
CO2: 23 mmol/L (ref 22–32)
Calcium: 9 mg/dL (ref 8.9–10.3)
Chloride: 104 mmol/L (ref 98–111)
Creatinine, Ser: 0.89 mg/dL (ref 0.44–1.00)
GFR, Estimated: >60 mL/min (ref >60)
Glucose, Bld: 155 mg/dL — ABNORMAL HIGH (ref 70–99)
Potassium: 3.6 mmol/L (ref 3.5–5.1)
Sodium: 139 mmol/L (ref 135–145)

## 2024-02-29 LAB — GLUCOSE, CAPILLARY
Glucose-Capillary: 162 mg/dL — ABNORMAL HIGH (ref 70–99)
Glucose-Capillary: 153 mg/dL — ABNORMAL HIGH (ref 70–99)
Glucose-Capillary: 138 mg/dL — ABNORMAL HIGH (ref 70–99)
Glucose-Capillary: 135 mg/dL — ABNORMAL HIGH (ref 70–99)

## 2024-02-29 LAB — MAGNESIUM: Magnesium: 2.2 mg/dL (ref 1.7–2.4)

## 2024-02-29 MED ORDER — PROMETHAZINE (PHENERGAN) 6.25MG IN NS 50ML IVPB
6.2500 mg | Freq: Four times a day (QID) | INTRAVENOUS | Status: DC | PRN
  Administered 2024-02-29 – 2024-03-03 (×7): 6.25 mg via INTRAVENOUS
  Filled 2024-02-29 (×2): qty 6.25
  Filled 2024-02-29: qty 50
  Filled 2024-02-29 (×3): qty 6.25
  Filled 2024-02-29 (×2): qty 50

## 2024-02-29 MED ORDER — POTASSIUM CHLORIDE IN NACL 20-0.9 MEQ/L-% IV SOLN
INTRAVENOUS | Status: AC
  Filled 2024-02-29 (×3): qty 1000

## 2024-02-29 NOTE — Progress Notes (Signed)
   02/29/24 1014  TOC Brief Assessment  Insurance and Status Reviewed  Patient has primary care physician Yes  Home environment has been reviewed Home with spouse  Prior level of function: independent  Prior/Current Home Services No current home services  Social Drivers of Health Review SDOH reviewed no interventions necessary  Readmission risk has been reviewed Yes  Transition of care needs no transition of care needs at this time

## 2024-02-29 NOTE — Plan of Care (Signed)

## 2024-02-29 NOTE — Progress Notes (Signed)
 Progress Note     Subjective: She has had ongoing n/v since my visit with her yesterday and vomits while I am in the room. Abdominal pain is stable to slightly worse. Denies flatus or BM   Objective: Vital signs in last 24 hours: Temp:  [98.1 F (36.7 C)-98.6 F (37 C)] 98.1 F (36.7 C) (03/07 0846) Pulse Rate:  [61-99] 90 (03/07 0846) Resp:  [11-20] 16 (03/07 0846) BP: (132-175)/(77-98) 169/95 (03/07 0846) SpO2:  [91 %-97 %] 95 % (03/07 0846) Last BM Date : 02/26/24  Intake/Output from previous day: 03/06 0701 - 03/07 0700 In: 2984.6 [I.V.:1930.6; IV Piggyback:1054] Out: -  Intake/Output this shift: No intake/output data recorded.  PE: General: pleasant, WD, female who is laying in bed in NAD HEENT: head is normocephalic, atraumatic.  Sclera are noninjected.  Pupils equal and round. EOMs intact.  Ears and nose without any masses or lesions.  Mouth is pink and moist Lungs:  Respiratory effort nonlabored Abd: soft, mild distension. Moderate TTP across upper abdomen greatest in LUQ MSK: all 4 extremities are symmetrical with no cyanosis, clubbing, or edema. Skin: warm and dry  Psych: A&Ox3 with an appropriate affect.    Lab Results:  Recent Labs    02/28/24 0851 02/29/24 0405  WBC 6.0 7.4  HGB 12.7 15.2*  HCT 41.5 48.4*  PLT 151 175   BMET Recent Labs    02/28/24 0013 02/28/24 0851 02/29/24 0405  NA 137  --  139  K 3.5  --  3.6  CL 101  --  104  CO2 25  --  23  GLUCOSE 147*  --  155*  BUN 16  --  20  CREATININE 1.22* 1.20* 0.89  CALCIUM 10.2  --  9.0   PT/INR No results for input(s): "LABPROT", "INR" in the last 72 hours. CMP     Component Value Date/Time   NA 139 02/29/2024 0405   K 3.6 02/29/2024 0405   CL 104 02/29/2024 0405   CO2 23 02/29/2024 0405   GLUCOSE 155 (H) 02/29/2024 0405   BUN 20 02/29/2024 0405   BUN 9 05/03/2018 0000   CREATININE 0.89 02/29/2024 0405   CALCIUM 9.0 02/29/2024 0405   PROT 8.7 (H) 02/28/2024 0013   ALBUMIN  4.6 02/28/2024 0013   AST 15 02/28/2024 0013   ALT 15 02/28/2024 0013   ALKPHOS 61 02/28/2024 0013   BILITOT 0.8 02/28/2024 0013   GFRNONAA >60 02/29/2024 0405   GFRAA >60 10/21/2018 1838   Lipase     Component Value Date/Time   LIPASE 32 02/28/2024 0013       Studies/Results: CT ABDOMEN PELVIS W CONTRAST Result Date: 02/28/2024 CLINICAL DATA:  Epigastric pain and vomiting, initial encounter EXAM: CT ABDOMEN AND PELVIS WITH CONTRAST TECHNIQUE: Multidetector CT imaging of the abdomen and pelvis was performed using the standard protocol following bolus administration of intravenous contrast. RADIATION DOSE REDUCTION: This exam was performed according to the departmental dose-optimization program which includes automated exposure control, adjustment of the mA and/or kV according to patient size and/or use of iterative reconstruction technique. CONTRAST:  OMNIPAQUE IOHEXOL 300 MG/ML  SOLN COMPARISON:  07/16/2023 FINDINGS: Lower chest: No acute abnormality. Hepatobiliary: No focal liver abnormality is seen. Status post cholecystectomy. No biliary dilatation. Pancreas: Unremarkable. No pancreatic ductal dilatation or surrounding inflammatory changes. Spleen: Normal in size without focal abnormality. Adrenals/Urinary Tract: Adrenal glands are within normal limits. Kidneys demonstrate a normal enhancement pattern bilaterally. No renal calculi are seen. The  ureters are within normal limits. Stomach/Bowel: No obstructive or inflammatory changes of the colon are seen. The appendix is within normal limits. 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 Mildly dilated loops of jejunum are identified in the left mid abdomen. Some mildly inflamed loops of small bowel are noted within the obstructive change although a transition zone is noted on image number 111 of series 601 in the left mid abdomen. The more distal small bowel is within normal limits. Vascular/Lymphatic: No significant  vascular findings are present. No enlarged abdominal or pelvic lymph nodes. Reproductive: Uterus is somewhat bulky consistent with uterine fibroid change. Small simple cysts are noted bilaterally. No follow-up is recommended. Other: No abdominal wall hernia or abnormality. No abdominopelvic ascites. Musculoskeletal: No acute or significant osseous findings. IMPRESSION: Mild partial small bowel obstruction in the jejunum secondary to a transition zone in the left mid abdomen as described. This is likely related to adhesions as no focal mass is seen. Uterine fibroid change. Electronically Signed   By: Alcide Clever M.D.   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 Andrey Campanile   - CT w/ Mild partial small bowel obstruction in the jejunum secondary to a transition zone in the left mid abdomen. There is some inflammation of loops of SB  - No current indication for emergency surgery - gastrografin given PO. 8 hour film pending but given ongoing n/v recc NGT placement for decompression - Keep K > 4 and Mg > 2 for bowel function - Mobilize for bowel function  FEN: NPO/NGT LIWS, IVF @ 100 ml/hr ID: none indicated VTE: SCDs. lovenox   HTN - on multiple PO meds at baseline. States she has had elevated diastolic BP specifically in the past and that it worsens with pain. PRN IV metoprolol T2DM - SSI AKI - received 1L bolus in ED. Resolved. Continue IVF at 100 ml/hr today given NPO status    I reviewed last 24 h vitals and pain scores, last 48 h intake and output, last 24 h labs and trends, and last 24 h imaging results.    LOS: 1 day   Eric Form, [facility] 02/29/2024, 10:08 AM Please see Amion for pager number during day hours 7:00am-4:30pm

## 2024-02-29 NOTE — Progress Notes (Deleted)
 NEUROLOGY FOLLOW UP OFFICE NOTE  Shelly Larson [id number]  Subjective:  Shelly Larson is a 53 y.o. year old right-handed female with a medical history of migraine, HTN, DM, hep B, ADHD, colon cancer, anxiety, OA, plantar fasciitis, gastric sleeve who we last saw on 11/07/23 for migraine without aura.  To briefly review: Patient has had headaches for a lot of her life. They went away when she was in her 23s. About 4-5 years ago, she began having headaches again. She was on metoprolol, other BP meds, and topirimate for prevention. She was on sumatriptan for abortive therapy. This was working until recently. She was getting 3-4 headaches per month at that time. The sumatriptan would make her headaches tolerable. Patient stopped taking topirimate about 1 year ago.    Over the last 2-3 months, she has had worsening headaches. She is having more frequent headaches. PCP switched patient rizatriptan 10 mg PRN. This does not help much. She has not identified a clear trigger.    The headaches patient has now are similar to headaches she had when she was younger. Most of the time the pain is behind both eyes. She describes a "banging, piercing sensation." She endorses photophobia, phonophobia, nausea, and vomiting. Movement and activity makes it worse. She also has aura (flashing lights, vision changes, floaters, dizziness like sensation). The headaches last about 1-1.5 days before dying down to a tolerable nagging pain. At the worst, the pain is 9/10. She has about 6-8 headaches per month. She always gets at least 1 every week, maybe 2-3. There is no positional component per patient. Her headaches slowly build to maximal intensity.   Patient has been having night sweats for at least 6-7 months and hot flashes for about a year. Patient had an ablation 2-3 years ago. She occasionally has occasional spotting that she calls her fake cycle. She gets a migraine when this happens. She is not sleeping well, maybe  sleeping 3-4 hours a night because of hot flashes, sweats, or headaches. She is not hormonal therapy.   She takes OTC about 3 doses per week and triptan 1-2 times per week (total of 5 doses per week, or 20 total doses per month).   Patient occasionally goes to the ED for acute treatment, about 6 times in the last year, due to headache that she cannot control. She last went to the ED around 06/2022.   Past NSAIDS/analgesics:  tylenol, excedrin, ibuprofen Past abortive triptans:  sumatriptan (no longer helping) Past muscle relaxants:  flexeril Past anti-emetic:  phenergan Past antihypertensive medications:  propranolol, metoprolol Past antidepressant medications:  elavil (helped a lot) Past anticonvulsant medications:  topirimate (thinks it may have helped) Past vitamins/Herbal/Supplements:  none Past antihistamines/decongestants:  bendryl   Current NSAIDS/analgesics:  tylenol, excedrin, ibuprofen Current triptans:  rizatriptan (not helping) Current ergotamine:  none Current anti-emetic:  none Current muscle relaxants:  none Current Antihypertensive medications:  metoprolol Current Antidepressant medications:  none Current Anticonvulsant medications:  none Current anti-CGRP:  none Current Vitamins/Herbal/Supplements:  none Current Antihistamines/Decongestants:  bendryl Hormone/birth control:  none   Caffeine:  occasional Alcohol:  occasional wine Smoker:  no Diet:  normal american diet Exercise:  walks 3-4 times per week (about 1 mile); gets a lot of steps in daily Depression:  none; Anxiety:  gets panic attacks and anxiety (on adderall) Other pain:  gets a pain from the neck; getting worse of late Family history of headache:  sister had migraines   01/18/23: Nurtec was  approved on 10/31/22. Patient was unaware and did not ever get this medication.   She was diagnosed with COVID on 11/01/22. Her headaches have been worse since COVID. She had very severe headaches during COVID. She  describes the headaches similar to prior, pain behind eyes, banging, piercing sensation with photophobia, phonophobia, and nausea and vomiting. The headaches are more likely with weather changes, cold weather, or pressure changes. She is having about 5 headaches per week currently (~20 days of headache per month compared to ~16 days of headache on 10/27/22).   Patient started Elavil 25 mg qhs. She also had rizatriptan but it did not work. She takes phenergan for nausea.   Vit D was low at 8.47, so supplementation was recommended. She started this as recommended.   05/03/23: Patient started Nurtec. It has been very helpful. Patient has had about 4 headaches per month, about 7-8 over the last couple of months.   On Monday (04/30/23) patient started having a headache. She initially took tylenol. She then took Nurtec that got rid of the headache for a while. The headache then came back. She thought the headache was going away, but it didn't and started coming back. She took Nurtec again, but still has a headache. This headache is similar to known headaches. This is the first headache that has been debilitating of late.   Patient endorses some increased stress due to husband's health. There has been no other recent changes to health or medications.  11/07/23: She was having 2-3 headaches prior to recent surgery. Patient had gastric sleeve surgery in 09/2023. Since the surgery, she has had an increase in frequency and intensity in headaches. She is now having 2-3 headaches per week. The headaches are the same as previous (no change in character). She had one so bad that she went almost went to the ED last week.   She takes Elavil 75 mg at bedtime. Nurtec is also not working as well since surgery. It was working within an hour previously, now taking multiple hours to work. At last visit, I prescribed a medrol dose pack. She is not sure if it worked, but her headaches did get better with some time.   Her neck  pain is improved from prior.   Most recent Assessment and Plan (11/07/23): This is Ivin Booty, a 53 y.o. female with: Chronic migraines without aura. She has previously failed propranolol, metoprolol, and topiramate for migraine prevention. She has failed tylenol, Excedrin, ibuprofen, sumatriptan, and rizatriptan for migraine rescue. Patient is now not responding well to Nurtec since gastric sleeve surgery. She is also not responding as well to Elavil for prevention. Her gastric sleeve surgery may have put increased stress on her body increasing her migraine frequency and intensity, but now absorption of medications is a question due to the surgery. For this reason, I will order an injectable for prevention and nasal spray for rescue to avoid any issues with GI absorption. Vit D deficiency      Plan: Medrol dose pak to try to break current headache cycle Migraine prevention:  Continue Elavil to 75 mg at bedtime; Will order Aimovig 140 mg every 28 days and attempt to get approved by insurance. I will likely slowly taper off Elavil when patient is able to start Aimovig. Migraine rescue:  Continue Nurtec 75 mg PRN; Will order Zavzpret 10 mg nasal spray PRN and attempt to get approved. She will continue Nurtec until this is approved then stop Nurtec. Limit use of pain  relievers to no more than 2 days out of week to prevent risk of rebound or medication-overuse headache. Keep headache diary Continue vit D 1000 IU daily   -Blood work: B1, B12, TSH  Since their last visit: Lab work was normal.  Zavzpret was approved on 11/27/23 to 11/26/24.*** Aimovig was approved on 12/07/23 to 12/06/24.***  Headaches?  Current medications: ***  Of note, patient was admitted on 02/28/24 for SBO.***  MEDICATIONS:  Facility-Administered Encounter Medications as of 03/07/2024  Medication   [COMPLETED] 0.9 %  sodium chloride infusion   0.9 % NaCl with KCl 20 mEq/ L  infusion   [COMPLETED] diatrizoate  meglumine-sodium (GASTROGRAFIN) 66-10 % solution 90 mL   [COMPLETED] dicyclomine (BENTYL) injection 20 mg   diphenhydrAMINE (BENADRYL) capsule 25 mg   Or   diphenhydrAMINE (BENADRYL) injection 25 mg   [COMPLETED] droperidol (INAPSINE) 2.5 MG/ML injection 1.25 mg   enoxaparin (LOVENOX) injection 40 mg   [COMPLETED] famotidine (PEPCID) IVPB 20 mg premix   [COMPLETED] fentaNYL (SUBLIMAZE) injection 50 mcg   HYDROmorphone (DILAUDID) injection 0.5-1 mg   [COMPLETED] HYDROmorphone (DILAUDID) injection 1 mg   insulin aspart (novoLOG) injection 0-15 Units   [COMPLETED] iohexol (OMNIPAQUE) 300 MG/ML solution 125 mL   [COMPLETED] ketorolac (TORADOL) 15 MG/ML injection 15 mg   [COMPLETED] lactated ringers bolus 1,000 mL   methocarbamol (ROBAXIN) tablet 500 mg   Or   methocarbamol (ROBAXIN) injection 500 mg   metoprolol tartrate (LOPRESSOR) injection 5 mg   ondansetron (ZOFRAN) 8 mg in sodium chloride 0.9 % 50 mL IVPB   [COMPLETED] ondansetron (ZOFRAN) injection 4 mg   prochlorperazine (COMPAZINE) tablet 10 mg   Or   prochlorperazine (COMPAZINE) injection 5-10 mg   [DISCONTINUED] HYDROmorphone (DILAUDID) injection 1 mg   [DISCONTINUED] ondansetron (ZOFRAN-ODT) disintegrating tablet 4 mg   Outpatient Encounter Medications as of 03/07/2024  Medication Sig   albuterol (VENTOLIN HFA) 108 (90 Base) MCG/ACT inhaler Inhale 2 puffs into the lungs every 4 (four) hours as needed for wheezing or shortness of breath.    amitriptyline (ELAVIL) 25 MG tablet Take 3 tablets (75 mg total) by mouth at bedtime.   amLODipine (NORVASC) 10 MG tablet TAKE 1 TABLET BY MOUTH EVERY DAY   amphetamine-dextroamphetamine (ADDERALL XR) 30 MG 24 hr capsule Take 1 capsule (30 mg total) by mouth every morning.   amphetamine-dextroamphetamine (ADDERALL) 15 MG tablet Take 1 tab po around 2 pm (Patient taking differently: Take 15 mg by mouth daily as needed (in the afternoon as needed for focus/attention.).)   clobetasol ointment  (TEMOVATE) 0.05 % Apply 1 Application topically 2 (two) times daily. AFTER 2 WEEKS THEN STOP (Patient not taking: Reported on 02/28/2024)   clonazePAM (KLONOPIN) 0.5 MG tablet Take 1 tablet (0.5 mg total) by mouth 2 (two) times daily.   Erenumab-aooe (AIMOVIG) 140 MG/ML SOAJ Inject 140 mg into the skin every 28 (twenty-eight) days.   hydrocortisone (ANUSOL-HC) 2.5 % rectal cream Place 1 Application rectally 2 (two) times daily. (Patient taking differently: Place 1 Application rectally 2 (two) times daily as needed for hemorrhoids or anal itching.)   LINZESS 290 MCG CAPS capsule TAKE 1 CAPSULE BY MOUTH DAILY BEFORE BREAKFAST.   losartan-hydrochlorothiazide (HYZAAR) 100-25 MG tablet TAKE 1 TABLET BY MOUTH EVERY DAY   metoprolol succinate (TOPROL-XL) 50 MG 24 hr tablet Take 50 mg by mouth every morning.    omeprazole (PRILOSEC) 40 MG capsule Take 1 capsule (40 mg total) by mouth daily. (Patient taking differently: Take 40 mg by mouth  every evening.)   oxyCODONE (OXY IR/ROXICODONE) 5 MG immediate release tablet Take 1 tablet (5 mg total) by mouth every 6 (six) hours as needed for breakthrough pain or severe pain (pain score 7-10). (Patient not taking: Reported on 11/07/2023)   promethazine (PHENERGAN) 12.5 MG tablet Take 1 tablet (12.5 mg total) by mouth every 6 (six) hours as needed for nausea or vomiting.   Rimegepant Sulfate (NURTEC) 75 MG TBDP Take 1 tablet (75 mg total) by mouth daily as needed.   Safety Seal Miscellaneous MISC Apply 1 Application topically at bedtime. Medication Name: Melaxemic Cream (Tranexamic Acid, Kojic Acid, Vit C, Hydrocortisone,Hyaluronic Acid)   sucralfate (CARAFATE) 1 GM/10ML suspension Take by mouth.   Zavegepant HCl (ZAVZPRET) 10 MG/ACT SOLN Place 10 mg into the nose as needed (for migraine).   [DISCONTINUED] gabapentin (NEURONTIN) 100 MG capsule Take 1 capsule (100 mg total) by mouth every 12 (twelve) hours for 5 days. (Patient not taking: Reported on 11/19/2023)    [DISCONTINUED] methylPREDNISolone (MEDROL DOSEPAK) 4 MG TBPK tablet Take 6 tablets for 1 day, then 5 tablets for 1 day, then 4 tablets for 1 day, then 3 tablets for 1 day, then 2 tablets for 1 day, then 1 tablet for 1 day, then STOP (Patient not taking: Reported on 11/19/2023)   [DISCONTINUED] sulfamethoxazole-trimethoprim (BACTRIM DS) 800-160 MG tablet Take 1 tablet by mouth 2 (two) times daily. (Patient not taking: Reported on 11/19/2023)    PAST MEDICAL HISTORY: Past Medical History:  Diagnosis Date   ADHD    Allergies    Anemia    Anxiety    Arthritis    Asthma    Biliary acute pancreatitis 2010   Colon cancer (HCC) 2016   Transverse colon    Diabetes mellitus without complication (HCC)    Dyspnea    GERD (gastroesophageal reflux disease)    Hypertension    Hypertension    Migraine    Pneumonia     PAST SURGICAL HISTORY: Past Surgical History:  Procedure Laterality Date   ABLATION     BAND HEMORRHOIDECTOMY     BIOPSY  09/02/2021   Procedure: BIOPSY;  Surgeon: Lanelle Bal, DO;  Location: [facility];  Service: Endoscopy;;   BREAST REDUCTION SURGERY Bilateral    CHOLECYSTECTOMY  2010   [place], biliary pancreatitis   COLECTOMY  09/30/2015   distal transverse d/t adenocarcinoma   COLONOSCOPY N/A 06/11/2018   Procedure: COLONOSCOPY;  Surgeon: Corbin Ade, MD;  Location: [facility];  Service: Endoscopy;  Laterality: N/A;  1:45pm   COLONOSCOPY WITH PROPOFOL N/A 04/02/2023   Procedure: COLONOSCOPY WITH PROPOFOL;  Surgeon: Corbin Ade, MD;  Location: [facility];  Service: Endoscopy;  Laterality: N/A;  12:15 pm   DILITATION & CURRETTAGE/HYSTROSCOPY WITH NOVASURE ABLATION N/A 09/10/2018   Procedure: DILATATION & CURETTAGE/HYSTEROSCOPY WITH MINERVA  ENDOMETRIAL ABLATION;  Surgeon: Tilda Burrow, MD;  Location: [facility];  Service: Gynecology;  Laterality: N/A;   ESOPHAGOGASTRODUODENOSCOPY (EGD) WITH PROPOFOL N/A 09/02/2021   Procedure:  ESOPHAGOGASTRODUODENOSCOPY (EGD) WITH PROPOFOL;  Surgeon: Lanelle Bal, DO;  Location: [facility];  Service: Endoscopy;  Laterality: N/A;   GASTRIC ROUX-EN-Y N/A 10/15/2023   Procedure: LAPAROSCOPIC GASTRIC SLEEVE AND LYSIS OF ADHESIONS WITH UPPER ENDOSCOPY;  Surgeon: Gaynelle Adu, MD;  Location: [facility];  Service: General;  Laterality: N/A;   HEMORRHOID SURGERY N/A 07/04/2023   Procedure: EXTENSIVE HEMORRHOIDECTOMY;  Surgeon: Lucretia Roers, MD;  Location: [facility];  Service: General;  Laterality: N/A;  POLYPECTOMY  06/11/2018   Procedure: POLYPECTOMY;  Surgeon: Corbin Ade, MD;  [facility]: [facility];  Service: Endoscopy;;  colon   POLYPECTOMY  04/02/2023   Procedure: POLYPECTOMY;  Surgeon: Corbin Ade, MD;  [facility]: [facility];  Service: Endoscopy;;    ALLERGIES: Allergies  Allergen Reactions   Lisinopril Cough    FAMILY HISTORY: Family History  Problem Relation Age of Onset   Hypertension Mother    Diabetes Mellitus II Mother    Heart disease Sister        dx in late 77's   Breast cancer Paternal Grandmother    Breast cancer Paternal Aunt    Colon cancer Neg Hx    Liver disease Neg Hx     SOCIAL HISTORY: Social History   Tobacco Use   Smoking status: Never    Passive exposure: Never   Smokeless tobacco: Never  Vaping Use   Vaping status: Never Used  Substance Use Topics   Alcohol use: Yes    Comment: rare   Drug use: No   Social History   Social History Narrative   Are you right handed or left handed? right   Are you currently employed ? 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 And niece   What type of home do you live in: 1 story or 2 story? One     Caffeine 1 soda a day      Objective:  Vital Signs:  There were no vitals taken for this visit.  ***  Labs and Imaging review: New results: 11/07/23: TSH wnl B12: 551 B1 wnl  Previously reviewed  results: 10/16/23: CMP significant for glucose of 180 CBC w/ diff significant for WBC 13.1 (neutrophilic predominance)   HbA1c (09/05/23): 7.3 HbA1c (01/09/22): 6.3   03/28/23: BMP significant for K of 2.9. CBC unremarkable   Vit D (10/27/22): 8.47 B12 (10/27/22): 392   Recent Labs           Lab Results  Component Value Date    HGBA1C 6.3 08/17/2022      Recent Labs           Lab Results  Component Value Date    TSH 1.42 08/17/2022      Recent Labs[] Expand by Default           Lab Results  Component Value Date    ESRSEDRATE 25 (H) 08/31/2021      Normal or unremarkable: CBC, CMP   Iron studies: iron 81, TIBC 364, sat ratio 22.3, ferritin 39.7  Assessment/Plan:  This is Ivin Booty, a 53 y.o. female with: ***   Plan: ***  Return to clinic in ***  Total time spent reviewing records, interview, history/exam, documentation, and coordination of care on day of encounter:  *** min  Jacquelyne Balint, MD

## 2024-02-29 NOTE — Progress Notes (Addendum)
 The patient requests that NGT be removed. Education provided regarding need for continuation. She is not agreeable at this time. Removal initiated by patient. No erythema or redness/injury at insertion site post removal.  On-call provider contacted with update.

## 2024-03-01 ENCOUNTER — Inpatient Hospital Stay (HOSPITAL_COMMUNITY)

## 2024-03-01 LAB — GLUCOSE, CAPILLARY
Glucose-Capillary: 126 mg/dL — ABNORMAL HIGH (ref 70–99)
Glucose-Capillary: 132 mg/dL — ABNORMAL HIGH (ref 70–99)
Glucose-Capillary: 118 mg/dL — ABNORMAL HIGH (ref 70–99)
Glucose-Capillary: 128 mg/dL — ABNORMAL HIGH (ref 70–99)

## 2024-03-01 MED ORDER — SODIUM CHLORIDE 0.9 % IV SOLN: INTRAVENOUS | Status: DC

## 2024-03-01 MED ORDER — AMLODIPINE BESYLATE 10 MG PO TABS
10.0000 mg | ORAL_TABLET | Freq: Every day | ORAL | Status: DC
  Administered 2024-03-01 – 2024-03-03 (×3): 10 mg via ORAL
  Filled 2024-03-01 (×3): qty 1

## 2024-03-01 MED ORDER — ALUM & MAG HYDROXIDE-SIMETH 200-200-20 MG/5ML PO SUSP
15.0000 mL | ORAL | Status: DC | PRN
  Administered 2024-03-01 (×2): 15 mL via ORAL
  Filled 2024-03-01 (×2): qty 30

## 2024-03-01 MED ORDER — CHLORPROMAZINE HCL 25 MG/ML IJ SOLN
50.0000 mg | Freq: Three times a day (TID) | INTRAMUSCULAR | Status: DC | PRN
  Administered 2024-03-01 – 2024-03-02 (×2): 50 mg via INTRAMUSCULAR
  Filled 2024-03-01 (×6): qty 2

## 2024-03-01 NOTE — Plan of Care (Signed)

## 2024-03-01 NOTE — Progress Notes (Signed)
 Subjective/Chief Complaint: Patient pulled out her NG tube.  A liter was removed from it.  She refuses the NG tube now.  Complains of hiccups.   Objective: Vital signs in last 24 hours: Temp:  [98.5 F (36.9 C)-98.9 F (37.2 C)] 98.9 F (37.2 C) (03/08 0422) Pulse Rate:  [73-96] 73 (03/08 0848) Resp:  [16-18] 16 (03/08 0848) BP: (164-187)/(86-115) 186/115 (03/08 0848) SpO2:  [90 %-95 %] 95 % (03/08 0848) Last BM Date : 02/28/24  Intake/Output from previous day: 03/07 0701 - 03/08 0700 In: 1831.9 [I.V.:1670.5; IV Piggyback:161.4] Out: 1080 [Emesis/NG output:1080] Intake/Output this shift: Total I/O In: -  Out: 100 [Emesis/NG output:100]  General appearance: alert and cooperative Resp: clear to auscultation bilaterally Cardio: Normal sinus rhythm GI: Obese flat soft.  Exam amended by obesity.  No peritonitis.  Lab Results:  Recent Labs    02/28/24 0851 02/29/24 0405  WBC 6.0 7.4  HGB 12.7 15.2*  HCT 41.5 48.4*  PLT 151 175   BMET Recent Labs    02/28/24 0013 02/28/24 0851 02/29/24 0405  NA 137  --  139  K 3.5  --  3.6  CL 101  --  104  CO2 25  --  23  GLUCOSE 147*  --  155*  BUN 16  --  20  CREATININE 1.22* 1.20* 0.89  CALCIUM 10.2  --  9.0   PT/INR No results for input(s): "LABPROT", "INR" in the last 72 hours. ABG No results for input(s): "PHART", "HCO3" in the last 72 hours.  Invalid input(s): "PCO2", "PO2"  Studies/Results: DG Abd Portable 1V Result Date: 02/29/2024 CLINICAL DATA:  Nasogastric tube placement. EXAM: PORTABLE ABDOMEN - 1 VIEW COMPARISON:  Earlier today FINDINGS: Tip and side port of the enteric tube below the diaphragm in the stomach. Gaseous small bowel distention in the upper abdomen is partially included in the field of view. IMPRESSION: Tip and side port of the enteric tube below the diaphragm in the stomach. Electronically Signed   By: Narda Rutherford M.D.   On: 02/29/2024 12:43   DG Abd Portable 1V-Small Bowel Obstruction  Protocol-initial, 8 hr delay Result Date: 02/29/2024 CLINICAL DATA:  Abdominal pain and vomiting. Mild partial small bowel obstruction seen on an abdomen and pelvis CT yesterday. 8 hour delayed image after drinking oral contrast. EXAM: PORTABLE ABDOMEN - 1 VIEW COMPARISON:  Abdomen and pelvis CT dated 02/28/2024. FINDINGS: Mildly dilated loops of jejunum with improvement. Gas and stool in normal caliber colon. Left upper quadrant abdominal surgical clips. Cholecystectomy clips. No oral contrast seen in the bowel. The stomach is not included on the image. Unremarkable bones. IMPRESSION: 1. Mild partial small bowel obstruction with improvement. 2. No oral contrast seen in the bowel.  The stomach is not included. Electronically Signed   By: Beckie Salts M.D.   On: 02/29/2024 11:55    Anti-infectives: Anti-infectives (From admission, onward)    None       Assessment/Plan:   pSBO H/o lap sleeve gastrectomy with LOA for 30 min 10/15/23 Dr. Andrey Campanile   - CT w/ Mild partial small bowel obstruction in the jejunum secondary to a transition zone in the left mid abdomen. There is some inflammation of loops of SB  - No current indication for emergency surgery -Patient pulled out NG tube.  Does not want to replace.  KUB this morning show contrast in the colon but this has not been read by radiology.  Patient has hiccups.  Trial of Thorazine ordered  Repeat KUB tomorrow  Patient's on amlodipine.  Will restart that with sips of medicine due to blood pressure control issues.   - Keep K > 4 and Mg > 2 for bowel function - Mobilize for bowel function   FEN: NPO/NGT LIWS, IVF @ 100 ml/hr ID: none indicated VTE: SCDs. lovenox   HTN - on multiple PO meds at baseline. States she has had elevated diastolic BP specifically in the past and that it worsens with pain. PRN IV metoprolol T2DM - SSI AKI - received 1L bolus in ED. Resolved. Continue IVF at 100 ml/hr today given NPO status     I reviewed last 24  h vitals and pain scores, last 48 h intake and output, last 24 h labs and trends, and last 24 h imaging results.  LOS: 2 days    Dortha Schwalbe MD     03/01/2024 Moderate complexity

## 2024-03-02 ENCOUNTER — Inpatient Hospital Stay (HOSPITAL_COMMUNITY)

## 2024-03-02 LAB — GLUCOSE, CAPILLARY
Glucose-Capillary: 118 mg/dL — ABNORMAL HIGH (ref 70–99)
Glucose-Capillary: 132 mg/dL — ABNORMAL HIGH (ref 70–99)
Glucose-Capillary: 127 mg/dL — ABNORMAL HIGH (ref 70–99)
Glucose-Capillary: 123 mg/dL — ABNORMAL HIGH (ref 70–99)

## 2024-03-02 MED ORDER — DEXTROSE-SODIUM CHLORIDE 5-0.9 % IV SOLN: INTRAVENOUS | Status: DC

## 2024-03-02 NOTE — Plan of Care (Signed)

## 2024-03-02 NOTE — Progress Notes (Signed)
 Called  Rapid RN for advice on Blood pressure results. Notified of all communications with Dr Carolynne Edouard.  Patient asymptomatic with blood pressures.

## 2024-03-02 NOTE — Progress Notes (Signed)
   Subjective/Chief Complaint: Complains of pain   Objective: Vital signs in last 24 hours: Temp:  [98 F (36.7 C)-98.4 F (36.9 C)] 98 F (36.7 C) (03/09 0824) Pulse Rate:  [81-113] 99 (03/09 0824) Resp:  [16-22] 19 (03/09 0626) BP: (150-189)/(97-102) 168/100 (03/09 0824) SpO2:  [89 %-95 %] 93 % (03/09 0824) Last BM Date : 02/28/24  Intake/Output from previous day: 03/08 0701 - 03/09 0700 In: 454.7 [P.O.:50; I.V.:312.2; IV Piggyback:92.5] Out: 101 [Urine:1; Emesis/NG output:100] Intake/Output this shift: No intake/output data recorded.  General appearance: alert and cooperative Resp: clear to auscultation bilaterally Cardio: regular rate and rhythm GI: soft, mild tenderness. quiet  Lab Results:  Recent Labs    02/29/24 0405  WBC 7.4  HGB 15.2*  HCT 48.4*  PLT 175   BMET Recent Labs    02/29/24 0405  NA 139  K 3.6  CL 104  CO2 23  GLUCOSE 155*  BUN 20  CREATININE 0.89  CALCIUM 9.0   PT/INR No results for input(s): "LABPROT", "INR" in the last 72 hours. ABG No results for input(s): "PHART", "HCO3" in the last 72 hours.  Invalid input(s): "PCO2", "PO2"  Studies/Results: DG Abd 1 View Result Date: 03/01/2024 CLINICAL DATA:  Small-bowel obstruction EXAM: ABDOMEN - 1 VIEW COMPARISON:  X-ray 02/29/2024.  CT 02/28/2024 FINDINGS: Multiple dilated air-filled loops of bowel throughout the mid abdomen again seen consistent with known SBO. There is some air in nondilated loops of colon with some mild stool including towards the rectum. Surgical clips in the right upper quadrant. No obvious free air on these supine radiographs. The diaphragm is clipped off the edge of the film. In addition the films are under penetrated and there is motion limiting evaluation. IMPRESSION: Limited x-rays. Persistent air-filled dilated loops of small bowel in the mid abdomen. Recommend continued follow-up. Electronically Signed   By: Karen Kays M.D.   On: 03/01/2024 12:42   DG Abd  Portable 1V Result Date: 02/29/2024 CLINICAL DATA:  Nasogastric tube placement. EXAM: PORTABLE ABDOMEN - 1 VIEW COMPARISON:  Earlier today FINDINGS: Tip and side port of the enteric tube below the diaphragm in the stomach. Gaseous small bowel distention in the upper abdomen is partially included in the field of view. IMPRESSION: Tip and side port of the enteric tube below the diaphragm in the stomach. Electronically Signed   By: Narda Rutherford M.D.   On: 02/29/2024 12:43    Anti-infectives: Anti-infectives (From admission, onward)    None       Assessment/Plan: s/p * No surgery found * Continue bowel rest Ng to be placed in radiology tomorrow. They don't have staff to do over weekend. Once placed then will start small bowel protocol Ambulate sbo  LOS: 3 days    Chevis Pretty III 03/02/2024

## 2024-03-02 NOTE — Plan of Care (Signed)

## 2024-03-03 ENCOUNTER — Inpatient Hospital Stay (HOSPITAL_COMMUNITY)

## 2024-03-03 LAB — BASIC METABOLIC PANEL
Anion gap: 8 (ref 5–15)
BUN: 18 mg/dL (ref 6–20)
CO2: 24 mmol/L (ref 22–32)
Calcium: 8.2 mg/dL — ABNORMAL LOW (ref 8.9–10.3)
Chloride: 113 mmol/L — ABNORMAL HIGH (ref 98–111)
Creatinine, Ser: 0.89 mg/dL (ref 0.44–1.00)
GFR, Estimated: >60 mL/min (ref >60)
Glucose, Bld: 125 mg/dL — ABNORMAL HIGH (ref 70–99)
Potassium: 3.6 mmol/L (ref 3.5–5.1)
Sodium: 145 mmol/L (ref 135–145)

## 2024-03-03 LAB — GLUCOSE, CAPILLARY
Glucose-Capillary: 118 mg/dL — ABNORMAL HIGH (ref 70–99)
Glucose-Capillary: 109 mg/dL — ABNORMAL HIGH (ref 70–99)

## 2024-03-03 LAB — CBC
HCT: 45.9 % (ref 36.0–46.0)
Hemoglobin: 13.7 g/dL (ref 12.0–15.0)
MCH: 27.1 pg (ref 26.0–34.0)
MCHC: 29.8 g/dL — ABNORMAL LOW (ref 30.0–36.0)
MCV: 90.9 fL (ref 80.0–100.0)
Platelets: 151 10*3/uL (ref 150–400)
RBC: 5.05 MIL/uL (ref 3.87–5.11)
RDW: 14.8 % (ref 11.5–15.5)
WBC: 4.2 10*3/uL (ref 4.0–10.5)
nRBC: 0 % (ref 0.0–0.2)

## 2024-03-03 LAB — MAGNESIUM: Magnesium: 2.4 mg/dL (ref 1.7–2.4)

## 2024-03-03 MED ORDER — SODIUM CHLORIDE 0.9 % IV SOLN: INTRAVENOUS | Status: DC

## 2024-03-03 NOTE — Progress Notes (Addendum)
 Notified Phylliss Blakes MD. MD states that it would be considered AMA.  Patient is aware and would like to leave.

## 2024-03-03 NOTE — Progress Notes (Addendum)
 Patient states she wants to leave the hospital. She is getting nothing done and she started a job and needs to go back to it. Patient  request for NG tube to come out. Asked patient if she could wait until I spoke to the surgeon. Patient stated "Sure"

## 2024-03-03 NOTE — Progress Notes (Signed)
   Subjective/Chief Complaint: Complains of pain than yesterday.  Hiccups but no nausea per se,   Objective: Vital signs in last 24 hours: Temp:  [97.6 F (36.4 C)-99.6 F (37.6 C)] 98.2 F (36.8 C) (03/10 0600) Pulse Rate:  [97-103] 97 (03/10 0600) Resp:  [16-18] 18 (03/10 0600) BP: (137-159)/(74-99) 159/98 (03/10 0600) SpO2:  [90 %-95 %] 90 % (03/10 0600) Last BM Date : 02/28/24  Intake/Output from previous day: No intake/output data recorded. Intake/Output this shift: No intake/output data recorded.  General appearance: alert and cooperative Resp: clear to auscultation bilaterally Cardio: regular rate and rhythm GI: soft, mild left hemiabdominal tenderness as well as epigastric and right upper quadrant tenderness, no peritoneal signs, nondistended  Lab Results:  No labs done   Studies/Results: DG Abd 2 Views Result Date: 03/02/2024 CLINICAL DATA:  Small bowel obstruction. EXAM: ABDOMEN - 2 VIEW COMPARISON:  Radiograph yesterday, CT 02/28/2024 FINDINGS: Multiple small bowel air-fluid levels in the central abdomen with scattered gaseous small bowel distention up to 5.3 cm. No definite formed stool in the colon. Calcifications in the pelvis consistent with phleboliths. IMPRESSION: Multiple small bowel air-fluid levels with scattered gaseous small bowel distention, suspicious for small bowel obstruction. Electronically Signed   By: Narda Rutherford M.D.   On: 03/02/2024 16:57    Anti-infectives: Anti-infectives (From admission, onward)    None       Assessment/Plan: SBO-short segment enteritis? Continue bowel rest Ng to be placed in radiology. This was ordered on Saturday but apparently staff was not available over the weekend. Once placed then will start small bowel protocol Ambulate Check labs   LOS: 4 days    Shelly Larson 03/03/2024

## 2024-03-07 ENCOUNTER — Ambulatory Visit: Payer: Managed Care, Other (non HMO) | Admitting: Neurology

## 2024-03-07 NOTE — Discharge Summary (Signed)
 Patient ID: Shelly Larson [id number] 02-Mar-1971 53 y.o.  Admit date: 02/28/2024 Discharge date: 03/03/24 (patient left AMA)  Admitting Diagnosis: pSBO H/o lap sleeve gastrectomy with LOA for 30 min 10/15/23 Dr. Andrey Campanile  Discharge Diagnosis SBO-short segment enteritis?   Consultants None/Radiology for NGT placement   HPI: 53 y.o. female with medical history significant for colon cancer s/p partial colectomy, GERD, HTN, migraine, T2DM not on medication who presented to [facility] ED with abdominal pain, nausea, vomiting. She underwent laparoscopic sleeve gastrectomy with LOA by Dr. Andrey Campanile 09/2023 and had an uncomplicated post operative course with pain significantly improved. Yesterday she was eating an egg in the morning and developed acute severe abdominal pain in epigastrium and left upper quadrant. She developed associated nausea and vomiting and presented to ED for further evaluation. She takes linzess at baseline with bowel movements usually daily. Last BM 2 days ago and she has not been passing flatus in last 24 hours. She has had sick contacts at work with GI complaints.    Work up in ED concerning for SBO and patient transferred to [facility] ED for surgical evaluation.    This am pain is improved to 6/10 and nausea improved. No emesis today. She has HTN at baseline and has not taken her medications this am. She states she has worsening HTN with acute pain.   She is accompanied by her spouse and niece   Substance use: occ etoh use Allergies: lisinopril - cough Blood thinners: none Past Surgeries: laparoscopic cholecystectomy, lap gastric sleeve, lap partial colectomy  Procedures None  Hospital Course:  Patient presented as above and CT showed partial small bowel obstruction in the jejunum secondary to a transition zone in the left mid abdomen. There was some inflammation of loops of SB. She was admitted to general surgery. She was started on SBO protocol with gastrografin PO. On  hospital day 1 she had n/v overnight. NGT was ordered and placed. Patient reportedly pulled NGT out after placement. An order was placed for radiology to place NGT. NGT placed by radiology on hospital day 4, 3/10. On 3/10 the patient left AMA.   I was not directly involved in this patient's care and did not see the patient during their hospital stay, therefore the information in this discharge summary was taken entirely from the chart.   Allergies as of 03/03/2024       Reactions   Lisinopril Cough        Medication List     ASK your doctor about these medications    Aimovig 140 MG/ML Soaj Generic drug: Erenumab-aooe Inject 140 mg into the skin every 28 (twenty-eight) days.   albuterol 108 (90 Base) MCG/ACT inhaler Commonly known as: VENTOLIN HFA Inhale 2 puffs into the lungs every 4 (four) hours as needed for wheezing or shortness of breath.   amitriptyline 25 MG tablet Commonly known as: ELAVIL Take 3 tablets (75 mg total) by mouth at bedtime.   amLODipine 10 MG tablet Commonly known as: NORVASC TAKE 1 TABLET BY MOUTH EVERY DAY   amphetamine-dextroamphetamine 15 MG tablet Commonly known as: Adderall Take 1 tab po around 2 pm   amphetamine-dextroamphetamine 30 MG 24 hr capsule Commonly known as: Adderall XR Take 1 capsule (30 mg total) by mouth every morning.   clobetasol ointment 0.05 % Commonly known as: TEMOVATE Apply 1 Application topically 2 (two) times daily. AFTER 2 WEEKS THEN STOP   clonazePAM 0.5 MG tablet Commonly known as: KLONOPIN Take 1 tablet (  0.5 mg total) by mouth 2 (two) times daily.   hydrocortisone 2.5 % rectal cream Commonly known as: ANUSOL-HC Place 1 Application rectally 2 (two) times daily.   Linzess 290 MCG Caps capsule Generic drug: linaclotide TAKE 1 CAPSULE BY MOUTH DAILY BEFORE BREAKFAST.   losartan-hydrochlorothiazide 100-25 MG tablet Commonly known as: HYZAAR TAKE 1 TABLET BY MOUTH EVERY DAY   metoprolol succinate 50 MG 24 hr  tablet Commonly known as: TOPROL-XL Take 50 mg by mouth every morning.   Nurtec 75 MG Tbdp Generic drug: Rimegepant Sulfate Take 1 tablet (75 mg total) by mouth daily as needed.   omeprazole 40 MG capsule Commonly known as: PRILOSEC Take 1 capsule (40 mg total) by mouth daily.   oxyCODONE 5 MG immediate release tablet Commonly known as: Oxy IR/ROXICODONE Take 1 tablet (5 mg total) by mouth every 6 (six) hours as needed for breakthrough pain or severe pain (pain score 7-10).   promethazine 12.5 MG tablet Commonly known as: PHENERGAN Take 1 tablet (12.5 mg total) by mouth every 6 (six) hours as needed for nausea or vomiting.   Safety Seal Miscellaneous Misc Apply 1 Application topically at bedtime. Medication Name: Melaxemic Cream (Tranexamic Acid, Kojic Acid, Vit C, Hydrocortisone,Hyaluronic Acid)   sucralfate 1 GM/10ML suspension Commonly known as: CARAFATE Take by mouth.   Zavzpret 10 MG/ACT Soln Generic drug: Zavegepant HCl Place 10 mg into the nose as needed (for migraine).           Signed: Leary Roca, [facility] 03/07/2024, 2:19 PM Please see Amion for pager number during day hours 7:00am-4:30pm

## 2024-03-10 ENCOUNTER — Other Ambulatory Visit: Payer: Self-pay

## 2024-03-10 ENCOUNTER — Ambulatory Visit (INDEPENDENT_AMBULATORY_CARE_PROVIDER_SITE_OTHER)

## 2024-03-10 ENCOUNTER — Other Ambulatory Visit: Payer: Self-pay | Admitting: General Surgery

## 2024-03-10 VITALS — BP 152/94 | HR 98 | Temp 97.9°F | Resp 16 | Ht 71.0 in | Wt 224.8 lb

## 2024-03-10 DIAGNOSIS — E860 Dehydration: Secondary | ICD-10-CM | POA: Diagnosis not present

## 2024-03-10 MED ORDER — SODIUM CHLORIDE 0.9 % IV BOLUS
1000.0000 mL | Freq: Once | INTRAVENOUS | Status: AC
  Administered 2024-03-10: 1000 mL via INTRAVENOUS
  Filled 2024-03-10: qty 1000

## 2024-03-10 NOTE — Progress Notes (Signed)
 Diagnosis: Dehydration  Provider:  Chilton Greathouse MD  Procedure: IV Infusion  IV Type: Peripheral, IV Location: L Antecubital  Normal Saline, Dose: 1000 ml  Infusion Start Time: 1430  Infusion Stop Time: 1537  Post Infusion IV Care: Peripheral IV Discontinued  Discharge: Condition: Good, Destination: Home . AVS Declined  Performed by:  Nat Math, RN

## 2024-03-11 LAB — CBC WITH DIFFERENTIAL/PLATELET
Absolute Lymphocytes: 1300 {cells}/uL (ref 850–3900)
Absolute Monocytes: 503 {cells}/uL (ref 200–950)
Basophils Absolute: 40 {cells}/uL (ref 0–200)
Basophils Relative: 0.6 %
Eosinophils Absolute: 188 {cells}/uL (ref 15–500)
Eosinophils Relative: 2.8 %
HCT: 46.2 % — ABNORMAL HIGH (ref 35.0–45.0)
Hemoglobin: 15.1 g/dL (ref 11.7–15.5)
MCH: 27.4 pg (ref 27.0–33.0)
MCHC: 32.7 g/dL (ref 32.0–36.0)
MCV: 83.8 fL (ref 80.0–100.0)
MPV: 13.5 fL — ABNORMAL HIGH (ref 7.5–12.5)
Monocytes Relative: 7.5 %
Neutro Abs: 4670 {cells}/uL (ref 1500–7800)
Neutrophils Relative %: 69.7 %
Platelets: 225 10*3/uL (ref 140–400)
RBC: 5.51 10*6/uL — ABNORMAL HIGH (ref 3.80–5.10)
RDW: 13.7 % (ref 11.0–15.0)
Total Lymphocyte: 19.4 %
WBC: 6.7 10*3/uL (ref 3.8–10.8)

## 2024-03-11 LAB — BASIC METABOLIC PANEL
BUN: 13 mg/dL (ref 7–25)
CO2: 24 mmol/L (ref 20–32)
Calcium: 8.8 mg/dL (ref 8.6–10.4)
Chloride: 103 mmol/L (ref 98–110)
Creat: 1.01 mg/dL (ref 0.50–1.03)
Glucose, Bld: 98 mg/dL (ref 65–99)
Potassium: 3.1 mmol/L — ABNORMAL LOW (ref 3.5–5.3)
Sodium: 138 mmol/L (ref 135–146)

## 2024-03-12 ENCOUNTER — Other Ambulatory Visit (HOSPITAL_COMMUNITY): Payer: Self-pay | Admitting: General Surgery

## 2024-03-12 DIAGNOSIS — Z9884 Bariatric surgery status: Secondary | ICD-10-CM

## 2024-03-13 ENCOUNTER — Encounter

## 2024-03-13 ENCOUNTER — Ambulatory Visit

## 2024-03-13 ENCOUNTER — Ambulatory Visit (HOSPITAL_COMMUNITY)
Admission: RE | Admit: 2024-03-13 | Discharge: 2024-03-13 | Disposition: A | Discharge status: Home / Self Care | Source: Ambulatory Visit | Attending: General Surgery | Admitting: General Surgery

## 2024-03-13 ENCOUNTER — Other Ambulatory Visit: Payer: Self-pay | Admitting: General Surgery

## 2024-03-13 DIAGNOSIS — Z9884 Bariatric surgery status: Secondary | ICD-10-CM | POA: Diagnosis present

## 2024-03-13 MED ORDER — IOHEXOL 9 MG/ML PO SOLN
1000.0000 mL | ORAL | Status: AC
  Administered 2024-03-13: 1000 mL via ORAL

## 2024-03-13 MED ORDER — SODIUM CHLORIDE (PF) 0.9 % IJ SOLN
INTRAMUSCULAR | Status: AC
  Filled 2024-03-13: qty 50

## 2024-03-13 MED ORDER — IOHEXOL 300 MG/ML  SOLN
100.0000 mL | Freq: Once | INTRAMUSCULAR | Status: AC | PRN
  Administered 2024-03-13: 100 mL via INTRAVENOUS

## 2024-03-13 MED ORDER — IOHEXOL 9 MG/ML PO SOLN
ORAL | Status: AC
  Filled 2024-03-13: qty 1000

## 2024-03-14 ENCOUNTER — Telehealth: Payer: Self-pay | Admitting: General Surgery

## 2024-03-14 NOTE — Telephone Encounter (Signed)
 Patient has been admitted to the hospital earlier this with SBO.  She left the hospital AMA.  She had called the office several times with nausea, feeling lethargic.  We got labs and sent her for IV fluids.  She continued to have GI issues so we ordered a stat CT scan.  Unfortunately it shows persistent small bowel obstruction which is worsened.  This is likely due to adhesions from her prior colon surgery.  During her laparoscopic sleeve gastrectomy we took down some small bowel adhesions to the upper midline in order to do her sleeve gastrectomy but we left small bowel adhesions in her lower midline due to how dense they were and made an intraoperative decision not to perform a gastric bypass but to do a sleeve gastrectomy.  My guess is this is where her blockages.  We (I and my office staff) have attempted to contact the patient through her cell phone but it goes answered and her voicemail box is full.  Will attempt to send the patient a [facility] MyChart message instructing her to report to the [facility] emergency room for evaluation, IV fluids and bowel rest.  Patient had some mild hypokalemia the other day as an outpatient.

## 2024-03-15 ENCOUNTER — Other Ambulatory Visit: Payer: Self-pay | Admitting: Gastroenterology

## 2024-03-16 ENCOUNTER — Telehealth: Payer: Self-pay | Admitting: General Surgery

## 2024-03-16 NOTE — Telephone Encounter (Signed)
 Attempted to reach patient again regarding her persistent symptoms.  Please see chart for additional details.  We tried to contact the patient on Friday to tell her her CT scan still showed a small bowel obstruction which was worsening.  We were unable to reach her we left messages as well as sent MyChart messages through [facility] and [facility].  I reached out to the patient again today and left a voicemail message encouraging her and recommending that she come to the [facility] emergency room for evaluation.  I discussed potential complications if this is left untreated on the VM.  It appears the patient read her MyChart message in the [facility] system on March 21 at around 5:48 PM.

## 2024-03-17 ENCOUNTER — Other Ambulatory Visit: Payer: Self-pay

## 2024-03-17 ENCOUNTER — Inpatient Hospital Stay (HOSPITAL_COMMUNITY)
Admission: EM | Admit: 2024-03-17 | Discharge: 2024-03-21 | DRG: 390 | Disposition: A | Discharge status: Home / Self Care | Attending: Internal Medicine | Admitting: Internal Medicine

## 2024-03-17 ENCOUNTER — Encounter

## 2024-03-17 ENCOUNTER — Inpatient Hospital Stay (HOSPITAL_COMMUNITY)

## 2024-03-17 ENCOUNTER — Emergency Department (HOSPITAL_COMMUNITY)

## 2024-03-17 ENCOUNTER — Encounter (HOSPITAL_COMMUNITY): Payer: Self-pay

## 2024-03-17 ENCOUNTER — Ambulatory Visit

## 2024-03-17 DIAGNOSIS — Z9049 Acquired absence of other specified parts of digestive tract: Secondary | ICD-10-CM | POA: Diagnosis not present

## 2024-03-17 DIAGNOSIS — Z8701 Personal history of pneumonia (recurrent): Secondary | ICD-10-CM | POA: Diagnosis not present

## 2024-03-17 DIAGNOSIS — Z8249 Family history of ischemic heart disease and other diseases of the circulatory system: Secondary | ICD-10-CM | POA: Diagnosis not present

## 2024-03-17 DIAGNOSIS — E876 Hypokalemia: Secondary | ICD-10-CM | POA: Diagnosis present

## 2024-03-17 DIAGNOSIS — Z85038 Personal history of other malignant neoplasm of large intestine: Secondary | ICD-10-CM | POA: Diagnosis not present

## 2024-03-17 DIAGNOSIS — K56600 Partial intestinal obstruction, unspecified as to cause: Secondary | ICD-10-CM | POA: Diagnosis present

## 2024-03-17 DIAGNOSIS — E782 Mixed hyperlipidemia: Secondary | ICD-10-CM | POA: Diagnosis present

## 2024-03-17 DIAGNOSIS — F410 Panic disorder [episodic paroxysmal anxiety] without agoraphobia: Secondary | ICD-10-CM | POA: Diagnosis present

## 2024-03-17 DIAGNOSIS — E66811 Obesity, class 1: Secondary | ICD-10-CM | POA: Diagnosis present

## 2024-03-17 DIAGNOSIS — Z803 Family history of malignant neoplasm of breast: Secondary | ICD-10-CM | POA: Diagnosis not present

## 2024-03-17 DIAGNOSIS — Z6831 Body mass index (BMI) 31.0-31.9, adult: Secondary | ICD-10-CM | POA: Diagnosis not present

## 2024-03-17 DIAGNOSIS — Z888 Allergy status to other drugs, medicaments and biological substances status: Secondary | ICD-10-CM | POA: Diagnosis not present

## 2024-03-17 DIAGNOSIS — Z9884 Bariatric surgery status: Secondary | ICD-10-CM

## 2024-03-17 DIAGNOSIS — K56609 Unspecified intestinal obstruction, unspecified as to partial versus complete obstruction: Principal | ICD-10-CM | POA: Diagnosis present

## 2024-03-17 DIAGNOSIS — Z79899 Other long term (current) drug therapy: Secondary | ICD-10-CM

## 2024-03-17 DIAGNOSIS — I10 Essential (primary) hypertension: Secondary | ICD-10-CM | POA: Diagnosis present

## 2024-03-17 DIAGNOSIS — J45909 Unspecified asthma, uncomplicated: Secondary | ICD-10-CM | POA: Diagnosis present

## 2024-03-17 DIAGNOSIS — E119 Type 2 diabetes mellitus without complications: Secondary | ICD-10-CM

## 2024-03-17 DIAGNOSIS — Z833 Family history of diabetes mellitus: Secondary | ICD-10-CM | POA: Diagnosis not present

## 2024-03-17 LAB — BASIC METABOLIC PANEL
Anion gap: 8 (ref 5–15)
BUN: 16 mg/dL (ref 6–20)
CO2: 26 mmol/L (ref 22–32)
Calcium: 8.6 mg/dL — ABNORMAL LOW (ref 8.9–10.3)
Chloride: 102 mmol/L (ref 98–111)
Creatinine, Ser: 0.92 mg/dL (ref 0.44–1.00)
GFR, Estimated: >60 mL/min (ref >60)
Glucose, Bld: 102 mg/dL — ABNORMAL HIGH (ref 70–99)
Potassium: 3.1 mmol/L — ABNORMAL LOW (ref 3.5–5.1)
Sodium: 136 mmol/L (ref 135–145)

## 2024-03-17 LAB — MAGNESIUM: Magnesium: 2 mg/dL (ref 1.7–2.4)

## 2024-03-17 LAB — CBC WITH DIFFERENTIAL/PLATELET
Abs Immature Granulocytes: 0.01 10*3/uL (ref 0.00–0.07)
Basophils Absolute: 0.1 10*3/uL (ref 0.0–0.1)
Basophils Relative: 1 %
Eosinophils Absolute: 0.2 10*3/uL (ref 0.0–0.5)
Eosinophils Relative: 4 %
HCT: 43.9 % (ref 36.0–46.0)
Hemoglobin: 13.8 g/dL (ref 12.0–15.0)
Immature Granulocytes: 0 %
Lymphocytes Relative: 31 %
Lymphs Abs: 1.2 10*3/uL (ref 0.7–4.0)
MCH: 27.4 pg (ref 26.0–34.0)
MCHC: 31.4 g/dL (ref 30.0–36.0)
MCV: 87.1 fL (ref 80.0–100.0)
Monocytes Absolute: 0.4 10*3/uL (ref 0.1–1.0)
Monocytes Relative: 9 %
Neutro Abs: 2.2 10*3/uL (ref 1.7–7.7)
Neutrophils Relative %: 55 %
Platelets: 256 10*3/uL (ref 150–400)
RBC: 5.04 MIL/uL (ref 3.87–5.11)
RDW: 13.8 % (ref 11.5–15.5)
WBC: 4 10*3/uL (ref 4.0–10.5)
nRBC: 0 % (ref 0.0–0.2)

## 2024-03-17 LAB — PHOSPHORUS: Phosphorus: 3.6 mg/dL (ref 2.5–4.6)

## 2024-03-17 LAB — GLUCOSE, CAPILLARY: Glucose-Capillary: 90 mg/dL (ref 70–99)

## 2024-03-17 MED ORDER — DIATRIZOATE MEGLUMINE & SODIUM 66-10 % PO SOLN
90.0000 mL | Freq: Once | ORAL | Status: AC
  Administered 2024-03-18: 90 mL
  Filled 2024-03-17: qty 90

## 2024-03-17 MED ORDER — ENOXAPARIN SODIUM 40 MG/0.4ML IJ SOSY
40.0000 mg | PREFILLED_SYRINGE | INTRAMUSCULAR | Status: DC
  Administered 2024-03-17 – 2024-03-20 (×4): 40 mg via SUBCUTANEOUS
  Filled 2024-03-17 (×4): qty 0.4

## 2024-03-17 MED ORDER — IOHEXOL 300 MG/ML  SOLN
100.0000 mL | Freq: Once | INTRAMUSCULAR | Status: AC | PRN
  Administered 2024-03-17: 100 mL via INTRAVENOUS

## 2024-03-17 MED ORDER — SODIUM CHLORIDE 0.9 % IV BOLUS
2000.0000 mL | Freq: Once | INTRAVENOUS | Status: DC
Start: 2024-03-17 — End: 2024-03-18
  Filled 2024-03-17: qty 2000

## 2024-03-17 MED ORDER — HYDROMORPHONE HCL 1 MG/ML IJ SOLN: 0.5000 mg | INTRAMUSCULAR | Status: DC | PRN

## 2024-03-17 MED ORDER — METOPROLOL TARTRATE 5 MG/5ML IV SOLN
5.0000 mg | Freq: Four times a day (QID) | INTRAVENOUS | Status: DC
  Administered 2024-03-17 – 2024-03-20 (×10): 5 mg via INTRAVENOUS
  Filled 2024-03-17 (×11): qty 5

## 2024-03-17 MED ORDER — POTASSIUM CHLORIDE 10 MEQ/100ML IV SOLN
10.0000 meq | Freq: Once | INTRAVENOUS | Status: AC
  Administered 2024-03-17: 10 meq via INTRAVENOUS
  Filled 2024-03-17: qty 100

## 2024-03-17 MED ORDER — ACETAMINOPHEN 650 MG RE SUPP: 650.0000 mg | Freq: Four times a day (QID) | RECTAL | Status: DC | PRN

## 2024-03-17 MED ORDER — HYDROMORPHONE HCL 1 MG/ML IJ SOLN
1.0000 mg | Freq: Once | INTRAMUSCULAR | Status: AC
  Administered 2024-03-17: 1 mg via INTRAVENOUS
  Filled 2024-03-17: qty 1

## 2024-03-17 MED ORDER — PANTOPRAZOLE SODIUM 40 MG IV SOLR
40.0000 mg | INTRAVENOUS | Status: DC
  Administered 2024-03-17 – 2024-03-20 (×4): 40 mg via INTRAVENOUS
  Filled 2024-03-17 (×4): qty 10

## 2024-03-17 MED ORDER — LIDOCAINE HCL URETHRAL/MUCOSAL 2 % EX GEL
1.0000 | CUTANEOUS | Status: DC | PRN
  Administered 2024-03-17: 1
  Filled 2024-03-17: qty 11

## 2024-03-17 MED ORDER — ONDANSETRON HCL 4 MG/2ML IJ SOLN
4.0000 mg | Freq: Four times a day (QID) | INTRAMUSCULAR | Status: DC | PRN
  Administered 2024-03-17 – 2024-03-20 (×8): 4 mg via INTRAVENOUS
  Filled 2024-03-17 (×8): qty 2

## 2024-03-17 MED ORDER — CARMEX CLASSIC LIP BALM EX OINT
TOPICAL_OINTMENT | Freq: Once | CUTANEOUS | Status: AC
  Filled 2024-03-17: qty 10

## 2024-03-17 MED ORDER — POTASSIUM CHLORIDE IN NACL 20-0.9 MEQ/L-% IV SOLN
INTRAVENOUS | Status: AC
Start: 2024-03-17 — End: 2024-03-18
  Filled 2024-03-17 (×2): qty 1000

## 2024-03-17 MED ORDER — ACETAMINOPHEN 325 MG PO TABS
650.0000 mg | ORAL_TABLET | Freq: Four times a day (QID) | ORAL | Status: DC | PRN
  Administered 2024-03-18: 650 mg via ORAL
  Filled 2024-03-17: qty 2

## 2024-03-17 MED ORDER — HYDROMORPHONE HCL 1 MG/ML IJ SOLN
1.0000 mg | INTRAMUSCULAR | Status: DC | PRN
  Administered 2024-03-17 – 2024-03-18 (×4): 1 mg via INTRAVENOUS
  Filled 2024-03-17 (×4): qty 1

## 2024-03-17 MED ORDER — PROCHLORPERAZINE EDISYLATE 10 MG/2ML IJ SOLN
10.0000 mg | Freq: Once | INTRAMUSCULAR | Status: AC
  Administered 2024-03-17: 10 mg via INTRAVENOUS
  Filled 2024-03-17: qty 2

## 2024-03-17 NOTE — Telephone Encounter (Signed)
 PA was done and it was approved

## 2024-03-17 NOTE — H&P (Signed)
 History and Physical    Patient: Shelly Larson WGN:[id number] DOB: [birth year] DOA: 03/17/2024 DOS: the patient was seen and examined on 03/17/2024 PCP: Allwardt, Crist Infante, PA-C  Patient coming from: Home  Chief Complaint:  Chief Complaint  Patient presents with   Abdominal Pain   HPI: Shelly Larson is a 53 y.o. female with medical history significant of ADHD, seasonal allergies, iron deficiency anemia, anxiety, panic attacks, osteoarthritis, asthma, acute biliary pancreatitis, mixed hyperlipidemia, colon cancer, type 2 diabetes, dyspnea, GERD, hypertension, migraine headaches, history of pneumonia, history of gastric sleeve surgery, history of SBO who presented to the emergency department complaints of progressively worse abdominal pain associated with nausea, constipation with occasional bowel movements with small stool content who was sent to the emergency department by her PCP after she had a CAT scan done on Friday which showed worsening small bowel obstruction.   No chest pain, palpitations, diaphoresis, PND, orthopnea or pitting edema of the lower extremities.  No melena or hematochezia. He denied fever, chills, rhinorrhea, sore throat, wheezing or hemoptysis. No flank pain, dysuria, frequency or hematuria.  No polyuria, polydipsia, polyphagia or blurred vision.   ED course: Initial vital signs were temperature 98 F, pulse 88, respiration 16, BP 146/103 mmHg O2 sat 99% on room air.  Lab work: CBC was normal.  BMP showed potassium of 3.1 mmol/L, glucose of 102 and calcium of 8.6 mg/dL.  Imaging: CT abdomen/pelvis with contrast show small bowel obstruction with minimal interval decrease in the dilation of the proximal small bowel loops.  No evidence of bowel ischemia or pneumoperitoneum.  Uterine leiomyomas.    Review of Systems: As mentioned in the history of present illness. All other systems reviewed and are negative.  Past Medical History:  Diagnosis Date   ADHD    Allergies     Anemia    Anxiety    Arthritis    Asthma    Biliary acute pancreatitis 2010   Colon cancer (HCC) 2016   Transverse colon    Diabetes mellitus without complication (HCC)    Dyspnea    GERD (gastroesophageal reflux disease)    Hypertension    Hypertension    Migraine    Pneumonia    Past Surgical History:  Procedure Laterality Date   ABLATION     BAND HEMORRHOIDECTOMY     BIOPSY  09/02/2021   Procedure: BIOPSY;  Surgeon: Lanelle Bal, DO;  [facility]: [facility];  Service: Endoscopy;;   BREAST REDUCTION SURGERY Bilateral    CHOLECYSTECTOMY  2010   [place], biliary pancreatitis   COLECTOMY  09/30/2015   distal transverse d/t adenocarcinoma   COLONOSCOPY N/A 06/11/2018   Procedure: COLONOSCOPY;  Surgeon: Corbin Ade, MD;  Location: [facility];  Service: Endoscopy;  Laterality: N/A;  1:45pm   COLONOSCOPY WITH PROPOFOL N/A 04/02/2023   Procedure: COLONOSCOPY WITH PROPOFOL;  Surgeon: Corbin Ade, MD;  Location: [facility];  Service: Endoscopy;  Laterality: N/A;  12:15 pm   DILITATION & CURRETTAGE/HYSTROSCOPY WITH NOVASURE ABLATION N/A 09/10/2018   Procedure: DILATATION & CURETTAGE/HYSTEROSCOPY WITH MINERVA  ENDOMETRIAL ABLATION;  Surgeon: Tilda Burrow, MD;  Location: [facility];  Service: Gynecology;  Laterality: N/A;   ESOPHAGOGASTRODUODENOSCOPY (EGD) WITH PROPOFOL N/A 09/02/2021   Procedure: ESOPHAGOGASTRODUODENOSCOPY (EGD) WITH PROPOFOL;  Surgeon: Lanelle Bal, DO;  Location: [facility];  Service: Endoscopy;  Laterality: N/A;   GASTRIC ROUX-EN-Y N/A 10/15/2023   Procedure: LAPAROSCOPIC GASTRIC SLEEVE AND LYSIS OF ADHESIONS WITH UPPER ENDOSCOPY;  Surgeon: Gaynelle Adu, MD;  Location: [facility];  Service: General;  Laterality: N/A;   HEMORRHOID SURGERY N/A 07/04/2023   Procedure: EXTENSIVE HEMORRHOIDECTOMY;  Surgeon: Lucretia Roers, MD;  Location: [facility];  Service: General;  Laterality: N/A;   POLYPECTOMY  06/11/2018   Procedure: POLYPECTOMY;  Surgeon:  Corbin Ade, MD;  Location: [facility];  Service: Endoscopy;;  colon   POLYPECTOMY  04/02/2023   Procedure: POLYPECTOMY;  Surgeon: Corbin Ade, MD;  [facility]: [facility];  Service: Endoscopy;;   Social History:  reports that she has never smoked. She has never been exposed to tobacco smoke. She has never used smokeless tobacco. She reports current alcohol use. She reports that she does not use drugs.  Allergies  Allergen Reactions   Lisinopril Cough    Family History  Problem Relation Age of Onset   Hypertension Mother    Diabetes Mellitus II Mother    Heart disease Sister        dx in late 34's   Breast cancer Paternal Grandmother    Breast cancer Paternal Aunt    Colon cancer Neg Hx    Liver disease Neg Hx     Prior to Admission medications   Medication Sig Start Date End Date Taking? Authorizing Provider  Erenumab-aooe (AIMOVIG) 140 MG/ML SOAJ Inject 140 mg into the skin every 28 (twenty-eight) days. 11/07/23  Yes Antony Madura, MD  albuterol (VENTOLIN HFA) 108 (90 Base) MCG/ACT inhaler Inhale 2 puffs into the lungs every 4 (four) hours as needed for wheezing or shortness of breath.  10/17/17   [provider]  amitriptyline (ELAVIL) 25 MG tablet Take 3 tablets (75 mg total) by mouth at bedtime. 05/03/23 05/02/24  Antony Madura, MD  amLODipine (NORVASC) 10 MG tablet TAKE 1 TABLET BY MOUTH EVERY DAY 06/04/23   Allwardt, Alyssa M, PA-C  amphetamine-dextroamphetamine (ADDERALL XR) 30 MG 24 hr capsule Take 1 capsule (30 mg total) by mouth every morning. 07/08/23   Allwardt, Alyssa M, PA-C  amphetamine-dextroamphetamine (ADDERALL) 15 MG tablet Take 1 tab po around 2 pm Patient taking differently: Take 15 mg by mouth daily as needed (in the afternoon as needed for focus/attention.). 10/09/22   Allwardt, Crist Infante, PA-C  clonazePAM (KLONOPIN) 0.5 MG tablet Take 1 tablet (0.5 mg total) by mouth 2 (two) times daily. 12/14/23   Allwardt, Crist Infante, PA-C  hydrocortisone  (ANUSOL-HC) 2.5 % rectal cream Place 1 Application rectally 2 (two) times daily. Patient taking differently: Place 1 Application rectally 2 (two) times daily as needed for hemorrhoids or anal itching. 07/04/23   Geoffery Lyons, MD  LINZESS 290 MCG CAPS capsule TAKE 1 CAPSULE BY MOUTH DAILY BEFORE BREAKFAST. 03/17/24   Aida Raider, NP  losartan-hydrochlorothiazide (HYZAAR) 100-25 MG tablet TAKE 1 TABLET BY MOUTH EVERY DAY 10/08/23   Allwardt, Crist Infante, PA-C  metoprolol succinate (TOPROL-XL) 50 MG 24 hr tablet Take 50 mg by mouth every morning.  09/19/17   [provider]  omeprazole (PRILOSEC) 40 MG capsule Take 1 capsule (40 mg total) by mouth daily. Patient taking differently: Take 40 mg by mouth every evening. 03/19/23   Aida Raider, NP  ondansetron (ZOFRAN-ODT) 4 MG disintegrating tablet Take 4 mg by mouth every 6 (six) hours as needed for nausea or vomiting. 03/10/24 03/17/24  [provider]  oxyCODONE (OXY IR/ROXICODONE) 5 MG immediate release tablet Take 1 tablet (5 mg total) by mouth every 6 (six) hours as needed for breakthrough  pain or severe pain (pain score 7-10). Patient not taking: Reported on 11/07/2023 10/16/23   Gaynelle Adu, MD  pantoprazole (PROTONIX) 40 MG tablet Take 40 mg by mouth daily. 03/10/24 04/09/24  [provider]  promethazine (PHENERGAN) 12.5 MG tablet Take 1 tablet (12.5 mg total) by mouth every 6 (six) hours as needed for nausea or vomiting. 11/07/23   Antony Madura, MD  Rimegepant Sulfate (NURTEC) 75 MG TBDP Take 1 tablet (75 mg total) by mouth daily as needed. 11/01/23   Antony Madura, MD  Safety Seal Miscellaneous MISC Apply 1 Application topically at bedtime. Medication Name: Melaxemic Cream (Tranexamic Acid, Kojic Acid, Vit C, Hydrocortisone,Hyaluronic Acid) 11/19/23   Terri Piedra, DO  sucralfate (CARAFATE) 1 GM/10ML suspension Take by mouth. 11/15/23 02/28/24  [provider]  Zavegepant HCl (ZAVZPRET) 10 MG/ACT SOLN  Place 10 mg into the nose as needed (for migraine). 11/07/23   Antony Madura, MD    Physical Exam: Vitals:   03/17/24 0844 03/17/24 0931 03/17/24 1130 03/17/24 1416  BP: (!) 146/103 (!) 145/97 (!) 151/110 (!) 147/102  Pulse: 88 75 87 95  Resp: 16 18 17 16   Temp: 98 F (36.7 C) 98.6 F (37 C)  98.2 F (36.8 C)  TempSrc: Oral Oral  Oral  SpO2: 99% 98% 97% 98%   Physical Exam Vitals and nursing note reviewed.  Constitutional:      General: She is awake. She is not in acute distress.    Appearance: She is obese. She is ill-appearing.  HENT:     Head: Normocephalic.     Comments: NGT in place.    Nose: No rhinorrhea.     Mouth/Throat:     Mouth: Mucous membranes are dry.  Eyes:     General: No scleral icterus.    Pupils: Pupils are equal, round, and reactive to light.  Neck:     Vascular: No JVD.  Cardiovascular:     Rate and Rhythm: Normal rate and regular rhythm.     Heart sounds: S1 normal and S2 normal.  Pulmonary:     Effort: Pulmonary effort is normal.     Breath sounds: Normal breath sounds.  Abdominal:     General: Abdomen is protuberant. Bowel sounds are normal. There is distension.     Palpations: Abdomen is soft.     Tenderness: There is abdominal tenderness in the epigastric area. There is no right CVA tenderness, left CVA tenderness, guarding or rebound.  Musculoskeletal:     Cervical back: Neck supple.     Right lower leg: No edema.     Left lower leg: No edema.  Skin:    General: Skin is warm and dry.  Neurological:     General: No focal deficit present.     Mental Status: She is alert and oriented to person, place, and time.  Psychiatric:        Mood and Affect: Mood normal.        Behavior: Behavior normal. Behavior is cooperative.     Data Reviewed:  Results are pending, will review when available.  Assessment and Plan: Principal Problem:   SBO (small bowel obstruction) (HCC) Inpatient/MedSurg. Keep NPO. Continue NTG at LIS. Continue IV  fluids. Analgesics as needed. Antiemetics as needed. Pantoprazole 40 mg IVP every 24 hours. Keep electrolytes optimized. Follow-up CBC and CMP in AM. Follow-up imaging in the morning. General surgery input appreciated.  Active Problems:   Essential hypertension Metoprolol 5 mg p.o. every  6 hours. -Switch to oral formulation once cleared for oral intake.    Type 2 diabetes mellitus (HCC) Currently NPO. CBG monitoring every 6 hours.    Panic disorder (episodic paroxysmal anxiety) Anxiolytic as needed.    Class 1 obesity Current BMI 31.58 kg/m. Continue lifestyle modifications. Follow-up closely with PCP and/or bariatric clinic.    Hypokalemia Supplementing. Check phosphorus and magnesium level.    Hypocalcemia Recheck calcium with albumin level in the morning.    Advance Care Planning:   Code Status: Full Code   Consults: [facility].  Family Communication:   Severity of Illness: The appropriate patient status for this patient is INPATIENT. Inpatient status is judged to be reasonable and necessary in order to provide the required intensity of service to ensure the patient's safety. The patient's presenting symptoms, physical exam findings, and initial radiographic and laboratory data in the context of their chronic comorbidities is felt to place them at high risk for further clinical deterioration. Furthermore, it is not anticipated that the patient will be medically stable for discharge from the hospital within 2 midnights of admission.   * I certify that at the point of admission it is my clinical judgment that the patient will require inpatient hospital care spanning beyond 2 midnights from the point of admission due to high intensity of service, high risk for further deterioration and high frequency of surveillance required.*  Author: Bobette Mo, MD 03/17/2024 2:44 PM  For on call review [email or web address].   This document was prepared using Dragon  voice recognition software and may contain some unintended transcription errors.

## 2024-03-17 NOTE — ED Triage Notes (Signed)
 Pt referred to the ED by PCP after having a CT on Friday that showed worsening SBO. Pt states symptoms originally started several weeks ago and she was being treated outpatient. Her symptoms worsened with abd pain, nausea, and obstipation. Pt now endorses small, loose BM's. She is also now able to pass gas. She reports a hx of lap sleeve gastrectomy and transverse colon resection.

## 2024-03-17 NOTE — Consult Note (Signed)
 Consult Note  Shelly Larson 1971/09/23  [id number].    Requesting MD: Lorre Nick, MD Chief Complaint/Reason for Consult: pSBO, Hx of sleeve gastrectomy  HPI:  53 y.o. female with medical history significant for colon cancer s/p partial colectomy, GERD, HTN, migraine, T2DM not on medication who presented to [facility] with persistent abdominal pain, nausea, vomiting. She underwent laparoscopic sleeve gastrectomy with LOA by Dr. Andrey Campanile 09/2023 and had an uncomplicated post operative course with pain significantly improved. She was admitted from 02/28/24-03/03/24 with similar but signed out AMA on 3/10. During that admission there was question of possible enteritis as the source of symptoms as patient had some sick contacts at work. Since leaving the hospital she has had persistent dull upper abdominal pain with intermittent sharper pain. This is not associated with eating but she has not been able to keep much more that small amounts liquid down. She has had intermittent nausea and vomiting, last emesis Thursday or Friday last week but does still have some nausea. She is having some bowel function, passing flatus and describes some spurts of liquid BM. Initially she was having some dark stools but was taking pepto bismol, now reports stools are more clay colored and semi-solid. She reports sensation of fullness in upper abdomen. Also reports feeling lethargic. She was taking linzess at baseline but has been out for about 2 weeks with change in job/insurance. She is concerned with current PO intake that she is not able to maintain adequate nutrition and hydration.   Substance use: occ etoh use Allergies: lisinopril - cough Blood thinners: none Past Surgeries: laparoscopic cholecystectomy, lap gastric sleeve, lap partial colectomy  ROS: Negative other than HPI  Family History  Problem Relation Age of Onset   Hypertension Mother    Diabetes Mellitus II Mother    Heart disease Sister         dx in late 50's   Breast cancer Paternal Grandmother    Breast cancer Paternal Aunt    Colon cancer Neg Hx    Liver disease Neg Hx     Past Medical History:  Diagnosis Date   ADHD    Allergies    Anemia    Anxiety    Arthritis    Asthma    Biliary acute pancreatitis 2010   Colon cancer (HCC) 2016   Transverse colon    Diabetes mellitus without complication (HCC)    Dyspnea    GERD (gastroesophageal reflux disease)    Hypertension    Hypertension    Migraine    Pneumonia     Past Surgical History:  Procedure Laterality Date   ABLATION     BAND HEMORRHOIDECTOMY     BIOPSY  09/02/2021   Procedure: BIOPSY;  Surgeon: Lanelle Bal, DO;  [facility];  Service: Endoscopy;;   BREAST REDUCTION SURGERY Bilateral    CHOLECYSTECTOMY  2010   [place], biliary pancreatitis   COLECTOMY  09/30/2015   distal transverse d/t adenocarcinoma   COLONOSCOPY N/A 06/11/2018   Procedure: COLONOSCOPY;  Surgeon: Corbin Ade, MD;  [facility]: [facility];  Service: Endoscopy;  Laterality: N/A;  1:45pm   COLONOSCOPY WITH PROPOFOL N/A 04/02/2023   Procedure: COLONOSCOPY WITH PROPOFOL;  Surgeon: Corbin Ade, MD;  [facility]: [facility];  Service: Endoscopy;  Laterality: N/A;  12:15 pm   DILITATION & CURRETTAGE/HYSTROSCOPY WITH NOVASURE ABLATION N/A 09/10/2018   Procedure: DILATATION & CURETTAGE/HYSTEROSCOPY WITH MINERVA  ENDOMETRIAL ABLATION;  Surgeon: Christin Bach  V, MD;  Location: [facility];  Service: Gynecology;  Laterality: N/A;   ESOPHAGOGASTRODUODENOSCOPY (EGD) WITH PROPOFOL N/A 09/02/2021   Procedure: ESOPHAGOGASTRODUODENOSCOPY (EGD) WITH PROPOFOL;  Surgeon: Lanelle Bal, DO;  [facility];  Service: Endoscopy;  Laterality: N/A;   GASTRIC ROUX-EN-Y N/A 10/15/2023   Procedure: LAPAROSCOPIC GASTRIC SLEEVE AND LYSIS OF ADHESIONS WITH UPPER ENDOSCOPY;  Surgeon: Gaynelle Adu, MD;  Location: [facility];  Service: General;  Laterality: N/A;   HEMORRHOID SURGERY N/A  07/04/2023   Procedure: EXTENSIVE HEMORRHOIDECTOMY;  Surgeon: Lucretia Roers, MD;  Location: [facility];  Service: General;  Laterality: N/A;   POLYPECTOMY  06/11/2018   Procedure: POLYPECTOMY;  Surgeon: Corbin Ade, MD;  [facility];  Service: Endoscopy;;  colon   POLYPECTOMY  04/02/2023   Procedure: POLYPECTOMY;  Surgeon: Corbin Ade, MD;  [facility];  Service: Endoscopy;;    Social History:  reports that she has never smoked. She has never been exposed to tobacco smoke. She has never used smokeless tobacco. She reports current alcohol use. She reports that she does not use drugs.  Allergies:  Allergies  Allergen Reactions   Lisinopril Cough    (Not in a hospital admission)   Blood pressure (!) 145/97, pulse 75, temperature 98.6 F (37 C), temperature source Oral, resp. rate 18, SpO2 98%. Physical Exam:  General: pleasant, WD, obese female who is laying in bed in NAD HEENT: head is normocephalic, atraumatic.  Sclera are noninjected.  Pupils equal and round.  Ears and nose without any masses or lesions.  Mouth is pink and moist Heart: regular, rate, and rhythm.   Lungs: Respiratory effort nonlabored Abd: soft, mild upper abdominal ttp without peritonitis, some fullness to palpation of upper abdomen but lower abdomen is very soft MS: all 4 extremities are symmetrical with no cyanosis, clubbing, or edema. Skin: warm and dry with no masses, lesions, or rashes Neuro: Cranial nerves 2-12 grossly intact, sensation is normal throughout Psych: A&Ox3 with an appropriate affect.   Results for orders placed or performed during the hospital encounter of 03/17/24 (from the past 48 hours)  CBC with Differential/Platelet     Status: None   Collection Time: 03/17/24  9:47 AM  Result Value Ref Range   WBC 4.0 4.0 - 10.5 K/uL   RBC 5.04 3.87 - 5.11 MIL/uL   Hemoglobin 13.8 12.0 - 15.0 g/dL   HCT 16.1 09.6 - 04.5 %   MCV 87.1 80.0 - 100.0 fL   MCH 27.4 26.0 -  34.0 pg   MCHC 31.4 30.0 - 36.0 g/dL   RDW 40.9 81.1 - 91.4 %   Platelets 256 150 - 400 K/uL   nRBC 0.0 0.0 - 0.2 %   Neutrophils Relative % 55 %   Neutro Abs 2.2 1.7 - 7.7 K/uL   Lymphocytes Relative 31 %   Lymphs Abs 1.2 0.7 - 4.0 K/uL   Monocytes Relative 9 %   Monocytes Absolute 0.4 0.1 - 1.0 K/uL   Eosinophils Relative 4 %   Eosinophils Absolute 0.2 0.0 - 0.5 K/uL   Basophils Relative 1 %   Basophils Absolute 0.1 0.0 - 0.1 K/uL   Immature Granulocytes 0 %   Abs Immature Granulocytes 0.01 0.00 - 0.07 K/uL    Comment: Performed at [facility], [address], [place], [place] [address]  Basic metabolic panel     Status: Abnormal   Collection Time: 03/17/24  9:47 AM  Result Value  Ref Range   Sodium 136 135 - 145 mmol/L   Potassium 3.1 (L) 3.5 - 5.1 mmol/L   Chloride 102 98 - 111 mmol/L   CO2 26 22 - 32 mmol/L   Glucose, Bld 102 (H) 70 - 99 mg/dL    Comment: Glucose reference range applies only to samples taken after fasting for at least 8 hours.   BUN 16 6 - 20 mg/dL   Creatinine, Ser 4.09 0.44 - 1.00 mg/dL   Calcium 8.6 (L) 8.9 - 10.3 mg/dL   GFR, Estimated >81 >19 mL/min    Comment: (NOTE) Calculated using the CKD-EPI Creatinine Equation (2021)    Anion gap 8 5 - 15    Comment: Performed at [facility], [address] [place], [place] [address]   No results found.    Assessment/Plan pSBO Hx of constipation managed on Linzess H/o lap sleeve gastrectomy with LOA for 30 min 10/15/23 Dr. Andrey Campanile - CT 3/6 w/ Mild partial small bowel obstruction in the jejunum secondary to a transition zone in the left mid abdomen. There is some inflammation of loops of SB  - CT [facility] 3/20 with worsening mid small bowel obstruction since prior exam with transition point in the anterior mid abdomen, no pneumatosis, free air or abscess, prior sleeve gastrectomy, previous colonic anastomosis in the distal transverse colon - pt also reports being out of  linzess for the last 2 weeks with change in insurance from changing jobs. Don't see huge stool burden on CT from 3/20  - having some bowel function but only able to tolerate small amounts of liquids - No current indication for emergency surgery but concerned that patient is having persistent symptoms - reviewed with MD and will plan repeat CT [facility] with PO contrast today - if patient requires admission would recommend medical admission but surgery will follow as well  - may consider repeat SBO protocol pending CT results    FEN: NPO ID: none indicated VTE: ok to have SQH or LMWH from surgery standpoint     HTN - on multiple PO meds at baseline. PRN IV meds  T2DM - SSI   I reviewed ED provider notes, last 24 h vitals and pain scores, last 48 h intake and output, last 24 h labs and trends, and last 24 h imaging results.  This care required high  level of medical decision making.   Juliet Rude, [facility] Surgery 03/17/2024, 11:06 AM Please see Amion for pager number during day hours 7:00am-4:30pm

## 2024-03-17 NOTE — ED Provider Notes (Signed)
 [name] [facility] AT [facility] Provider Note   CSN: [id number] Arrival date & time: 03/17/24  4098     History  Chief Complaint  Patient presents with   Abdominal Pain    Shelly Larson is a 53 y.o. female.  53 year old female presents due to recent diagnosis of small bowel obstruction.  States that she was diagnosed last week by her surgeon Dr. Andrey Campanile.  Conservative management was done and they did a repeat CAT scan 3 days ago which showed worsening obstruction.  States that she received a voicemail last night to come to the hospital.  She notes that she has not had any emesis.  States that she is passing gas and stool.  Does have severe cramping at times.  Denies any fever.  She does have a history of colon resection due to colon cancer as well as history of gastric bypass surgery       Home Medications Prior to Admission medications   Medication Sig Start Date End Date Taking? Authorizing Provider  albuterol (VENTOLIN HFA) 108 (90 Base) MCG/ACT inhaler Inhale 2 puffs into the lungs every 4 (four) hours as needed for wheezing or shortness of breath.  10/17/17   [provider]  amitriptyline (ELAVIL) 25 MG tablet Take 3 tablets (75 mg total) by mouth at bedtime. 05/03/23 05/02/24  Antony Madura, MD  amLODipine (NORVASC) 10 MG tablet TAKE 1 TABLET BY MOUTH EVERY DAY 06/04/23   Allwardt, Alyssa M, PA-C  amphetamine-dextroamphetamine (ADDERALL XR) 30 MG 24 hr capsule Take 1 capsule (30 mg total) by mouth every morning. 07/08/23   Allwardt, Alyssa M, PA-C  amphetamine-dextroamphetamine (ADDERALL) 15 MG tablet Take 1 tab po around 2 pm Patient taking differently: Take 15 mg by mouth daily as needed (in the afternoon as needed for focus/attention.). 10/09/22   Allwardt, Crist Infante, PA-C  clobetasol ointment (TEMOVATE) 0.05 % Apply 1 Application topically 2 (two) times daily. AFTER 2 WEEKS THEN STOP Patient not taking: Reported on 02/28/2024 11/19/23   Terri Piedra, DO  clonazePAM (KLONOPIN) 0.5 MG tablet Take 1 tablet (0.5 mg total) by mouth 2 (two) times daily. 12/14/23   Allwardt, Crist Infante, PA-C  Erenumab-aooe (AIMOVIG) 140 MG/ML SOAJ Inject 140 mg into the skin every 28 (twenty-eight) days. 11/07/23   Antony Madura, MD  hydrocortisone (ANUSOL-HC) 2.5 % rectal cream Place 1 Application rectally 2 (two) times daily. Patient taking differently: Place 1 Application rectally 2 (two) times daily as needed for hemorrhoids or anal itching. 07/04/23   Geoffery Lyons, MD  LINZESS 290 MCG CAPS capsule TAKE 1 CAPSULE BY MOUTH DAILY BEFORE BREAKFAST. 03/17/24   Aida Raider, NP  losartan-hydrochlorothiazide (HYZAAR) 100-25 MG tablet TAKE 1 TABLET BY MOUTH EVERY DAY 10/08/23   Allwardt, Crist Infante, PA-C  metoprolol succinate (TOPROL-XL) 50 MG 24 hr tablet Take 50 mg by mouth every morning.  09/19/17   [provider]  omeprazole (PRILOSEC) 40 MG capsule Take 1 capsule (40 mg total) by mouth daily. Patient taking differently: Take 40 mg by mouth every evening. 03/19/23   Aida Raider, NP  oxyCODONE (OXY IR/ROXICODONE) 5 MG immediate release tablet Take 1 tablet (5 mg total) by mouth every 6 (six) hours as needed for breakthrough pain or severe pain (pain score 7-10). Patient not taking: Reported on 11/07/2023 10/16/23   Gaynelle Adu, MD  promethazine (PHENERGAN) 12.5 MG tablet Take 1 tablet (12.5 mg total) by mouth every 6 (six) hours as  needed for nausea or vomiting. 11/07/23   Antony Madura, MD  Rimegepant Sulfate (NURTEC) 75 MG TBDP Take 1 tablet (75 mg total) by mouth daily as needed. 11/01/23   Antony Madura, MD  Safety Seal Miscellaneous MISC Apply 1 Application topically at bedtime. Medication Name: Melaxemic Cream (Tranexamic Acid, Kojic Acid, Vit C, Hydrocortisone,Hyaluronic Acid) 11/19/23   Terri Piedra, DO  sucralfate (CARAFATE) 1 GM/10ML suspension Take by mouth. 11/15/23 02/28/24  [provider]  Zavegepant HCl (ZAVZPRET)  10 MG/ACT SOLN Place 10 mg into the nose as needed (for migraine). 11/07/23   Antony Madura, MD      Allergies    Lisinopril    Review of Systems   Review of Systems  All other systems reviewed and are negative.   Physical Exam Updated Vital Signs BP (!) 146/103 (BP Location: Left Arm)   Pulse 88   Temp 98 F (36.7 C) (Oral)   Resp 16   SpO2 99%  Physical Exam Vitals and nursing note reviewed.  Constitutional:      General: She is not in acute distress.    Appearance: Normal appearance. She is well-developed. She is not toxic-appearing.  HENT:     Head: Normocephalic and atraumatic.  Eyes:     General: Lids are normal.     Conjunctiva/sclera: Conjunctivae normal.     Pupils: Pupils are equal, round, and reactive to light.  Neck:     Thyroid: No thyroid mass.     Trachea: No tracheal deviation.  Cardiovascular:     Rate and Rhythm: Normal rate and regular rhythm.     Heart sounds: Normal heart sounds. No murmur heard.    No gallop.  Pulmonary:     Effort: Pulmonary effort is normal. No respiratory distress.     Breath sounds: Normal breath sounds. No stridor. No decreased breath sounds, wheezing, rhonchi or rales.  Abdominal:     General: There is no distension.     Palpations: Abdomen is soft.     Tenderness: There is generalized abdominal tenderness. There is no rebound.  Musculoskeletal:        General: No tenderness. Normal range of motion.     Cervical back: Normal range of motion and neck supple.  Skin:    General: Skin is warm and dry.     Findings: No abrasion or rash.  Neurological:     Mental Status: She is alert and oriented to person, place, and time. Mental status is at baseline.     GCS: GCS eye subscore is 4. GCS verbal subscore is 5. GCS motor subscore is 6.     Cranial Nerves: No cranial nerve deficit.     Sensory: No sensory deficit.     Motor: Motor function is intact.  Psychiatric:        Attention and Perception: Attention normal.         Speech: Speech normal.        Behavior: Behavior normal.     ED Results / Procedures / Treatments   Labs (all labs ordered are listed, but only abnormal results are displayed) Labs Reviewed  CBC WITH DIFFERENTIAL/PLATELET  BASIC METABOLIC PANEL    EKG None  Radiology No results found.  Procedures Procedures    Medications Ordered in ED Medications - No data to display  ED Course/ Medical Decision Making/ A&P  Medical Decision Making Amount and/or Complexity of Data Reviewed Labs: ordered.   Patient's labs are reassuring with exception of a slightly low potassium at 3.1.  Will give IV potassium for this.  Discussed with general surgery who is requesting a repeat CT scan but would like the patient admitted and they will see the patient in consultation.  Repeat CT scan did show continued dilated small bowel and concern for persistent obstruction.  They will likely have an NG tube placed.  Will consult hospitalist team        Final Clinical Impression(s) / ED Diagnoses Final diagnoses:  None    Rx / DC Orders ED Discharge Orders     None         Lorre Nick, MD 03/17/24 1424

## 2024-03-18 ENCOUNTER — Encounter: Payer: Self-pay | Admitting: General Surgery

## 2024-03-18 ENCOUNTER — Inpatient Hospital Stay (HOSPITAL_COMMUNITY)

## 2024-03-18 DIAGNOSIS — K56609 Unspecified intestinal obstruction, unspecified as to partial versus complete obstruction: Secondary | ICD-10-CM | POA: Diagnosis not present

## 2024-03-18 LAB — COMPREHENSIVE METABOLIC PANEL
ALT: 19 U/L (ref 0–44)
AST: 12 U/L — ABNORMAL LOW (ref 15–41)
Albumin: 3 g/dL — ABNORMAL LOW (ref 3.5–5.0)
Alkaline Phosphatase: 70 U/L (ref 38–126)
Anion gap: 4 — ABNORMAL LOW (ref 5–15)
BUN: 14 mg/dL (ref 6–20)
CO2: 27 mmol/L (ref 22–32)
Calcium: 8.4 mg/dL — ABNORMAL LOW (ref 8.9–10.3)
Chloride: 106 mmol/L (ref 98–111)
Creatinine, Ser: 0.91 mg/dL (ref 0.44–1.00)
GFR, Estimated: >60 mL/min (ref >60)
Glucose, Bld: 81 mg/dL (ref 70–99)
Potassium: 3.6 mmol/L (ref 3.5–5.1)
Sodium: 137 mmol/L (ref 135–145)
Total Bilirubin: 0.7 mg/dL (ref 0.0–1.2)
Total Protein: 5.6 g/dL — ABNORMAL LOW (ref 6.5–8.1)

## 2024-03-18 LAB — CBC
HCT: 43.6 % (ref 36.0–46.0)
Hemoglobin: 13.4 g/dL (ref 12.0–15.0)
MCH: 27.7 pg (ref 26.0–34.0)
MCHC: 30.7 g/dL (ref 30.0–36.0)
MCV: 90.1 fL (ref 80.0–100.0)
Platelets: 251 10*3/uL (ref 150–400)
RBC: 4.84 MIL/uL (ref 3.87–5.11)
RDW: 14.1 % (ref 11.5–15.5)
WBC: 4.9 10*3/uL (ref 4.0–10.5)
nRBC: 0 % (ref 0.0–0.2)

## 2024-03-18 LAB — GLUCOSE, CAPILLARY
Glucose-Capillary: 77 mg/dL (ref 70–99)
Glucose-Capillary: 85 mg/dL (ref 70–99)
Glucose-Capillary: 88 mg/dL (ref 70–99)

## 2024-03-18 MED ORDER — HYDROMORPHONE HCL 1 MG/ML IJ SOLN
1.0000 mg | INTRAMUSCULAR | Status: DC | PRN
  Administered 2024-03-18 – 2024-03-20 (×14): 1 mg via INTRAVENOUS
  Filled 2024-03-18 (×14): qty 1

## 2024-03-18 NOTE — Assessment & Plan Note (Signed)
 Home regimen resumed

## 2024-03-18 NOTE — Assessment & Plan Note (Signed)
-   On scheduled Lopressor IV -As needed labetalol and hydralazine also ordered

## 2024-03-18 NOTE — Assessment & Plan Note (Addendum)
-   History of colon cancer with partial resection and gastric sleeve with LOA; now presents with abdominal pain and recurrent SBO noted on CT on admission -Nonoperative management being pursued for now.  Small bowel follow-through being performed by surgery and NG tube remains in place.  Some contrast noted to have made it to the colon and rectum on x-ray imaging today -Continue pain control - Diet advancement per surgery; started on CLD on 3/25

## 2024-03-18 NOTE — Plan of Care (Signed)

## 2024-03-18 NOTE — Progress Notes (Signed)
 Progress Note    Shelly Larson   UJW:[id number]  DOB: 09-Nov-1971  DOA: 03/17/2024     1 PCP: Allwardt, Crist Infante, PA-C  Initial CC: abdominal pain  Hospital Course: Shelly Larson is a 53 yo female with PMH gastric sleeve with LOA, colon cancer s/p partial resection, DM II, HTN, GERD, anxiety who presented with abdominal pain. CT abdomen/pelvis on admission showed concern for SBO involving proximal small bowel.  She was placed on bowel rest and admitted for general surgery evaluation.  NG tube also inserted.  Interval History:  NG tube in place.  Some nausea but no vomiting.  Endorses having flatus.  Assessment and Plan: * SBO (small bowel obstruction) (HCC) - History of colon cancer with partial resection and gastric sleeve with LOA; now presents with abdominal pain and recurrent SBO noted on CT on admission -Nonoperative management being pursued for now.  Small bowel follow-through being performed by surgery and NG tube remains in place.  Some contrast noted to have made it to the colon and rectum on x-ray imaging today -Continue pain control - Diet advancement per surgery; started on CLD on 3/25  Hypocalcemia - Replete as needed  Hypokalemia - Replete as needed  Class 1 obesity - s/p hx gastric sleeve  Panic disorder (episodic paroxysmal anxiety) - Resuming home meds as able  Type 2 diabetes mellitus (HCC) - CBGs stable without treatment necessary  Essential hypertension - On scheduled Lopressor IV   Old records reviewed in assessment of this patient  Antimicrobials:   DVT prophylaxis:  enoxaparin (LOVENOX) injection 40 mg Start: 03/17/24 2200   Code Status:   Code Status: Full Code  Mobility Assessment (Last 72 Hours)     Mobility Assessment     Row Name 03/18/24 1043 03/17/24 2040 03/17/24 1700       Does patient have an order for bedrest or is patient medically unstable No - Continue assessment No - Continue assessment No - Continue assessment     What  is the highest level of mobility based on the progressive mobility assessment? Level 5 (Walks with assist in room/hall) - Balance while stepping forward/back and can walk in room with assist - Complete Level 6 (Walks independently in room and hall) - Balance while walking in room without assist - Complete Level 5 (Walks with assist in room/hall) - Balance while stepping forward/back and can walk in room with assist - Complete              Barriers to discharge: none Disposition Plan: Home HH orders placed:  Status is: Inpatient  Objective: Blood pressure (!) 145/89, pulse 60, temperature 98.4 F (36.9 C), temperature source Oral, resp. rate 18, height 5\' 11"  (1.803 m), weight 102.7 kg, SpO2 95%.  Examination:  Physical Exam Constitutional:      Appearance: Normal appearance.  HENT:     Head: Normocephalic and atraumatic.     Mouth/Throat:     Mouth: Mucous membranes are moist.  Eyes:     Extraocular Movements: Extraocular movements intact.  Cardiovascular:     Rate and Rhythm: Normal rate and regular rhythm.  Pulmonary:     Effort: Pulmonary effort is normal. No respiratory distress.     Breath sounds: Normal breath sounds. No wheezing.  Abdominal:     General: Bowel sounds are normal. There is distension (mild).     Palpations: Abdomen is soft.     Tenderness: There is no abdominal tenderness.  Musculoskeletal:  General: Normal range of motion.     Cervical back: Normal range of motion and neck supple.  Skin:    General: Skin is warm and dry.  Neurological:     General: No focal deficit present.     Mental Status: She is alert.  Psychiatric:        Mood and Affect: Mood normal.      Consultants:  General surgery  Procedures:    Data Reviewed: Results for orders placed or performed during the hospital encounter of 03/17/24 (from the past 24 hours)  Magnesium     Status: None   Collection Time: 03/17/24  7:32 PM  Result Value Ref Range   Magnesium 2.0  1.7 - 2.4 mg/dL  Phosphorus     Status: None   Collection Time: 03/17/24  7:32 PM  Result Value Ref Range   Phosphorus 3.6 2.5 - 4.6 mg/dL  Glucose, capillary     Status: None   Collection Time: 03/17/24 11:26 PM  Result Value Ref Range   Glucose-Capillary 90 70 - 99 mg/dL  CBC     Status: None   Collection Time: 03/18/24  5:39 AM  Result Value Ref Range   WBC 4.9 4.0 - 10.5 K/uL   RBC 4.84 3.87 - 5.11 MIL/uL   Hemoglobin 13.4 12.0 - 15.0 g/dL   HCT 40.9 81.1 - 91.4 %   MCV 90.1 80.0 - 100.0 fL   MCH 27.7 26.0 - 34.0 pg   MCHC 30.7 30.0 - 36.0 g/dL   RDW 78.2 95.6 - 21.3 %   Platelets 251 150 - 400 K/uL   nRBC 0.0 0.0 - 0.2 %  Comprehensive metabolic panel     Status: Abnormal   Collection Time: 03/18/24  5:39 AM  Result Value Ref Range   Sodium 137 135 - 145 mmol/L   Potassium 3.6 3.5 - 5.1 mmol/L   Chloride 106 98 - 111 mmol/L   CO2 27 22 - 32 mmol/L   Glucose, Bld 81 70 - 99 mg/dL   BUN 14 6 - 20 mg/dL   Creatinine, Ser 0.86 0.44 - 1.00 mg/dL   Calcium 8.4 (L) 8.9 - 10.3 mg/dL   Total Protein 5.6 (L) 6.5 - 8.1 g/dL   Albumin 3.0 (L) 3.5 - 5.0 g/dL   AST 12 (L) 15 - 41 U/L   ALT 19 0 - 44 U/L   Alkaline Phosphatase 70 38 - 126 U/L   Total Bilirubin 0.7 0.0 - 1.2 mg/dL   GFR, Estimated >57 >84 mL/min   Anion gap 4 (L) 5 - 15  Glucose, capillary     Status: None   Collection Time: 03/18/24  6:34 AM  Result Value Ref Range   Glucose-Capillary 77 70 - 99 mg/dL  Glucose, capillary     Status: None   Collection Time: 03/18/24 12:24 PM  Result Value Ref Range   Glucose-Capillary 85 70 - 99 mg/dL    I have reviewed pertinent nursing notes, vitals, labs, and images as necessary. I have ordered labwork to follow up on as indicated.  I have reviewed the last notes from staff over past 24 hours. I have discussed patient's care plan and test results with nursing staff, CM/SW, and other staff as appropriate.  Time spent: Greater than 50% of the 55 minute visit was spent in  counseling/coordination of care for the patient as laid out in the A&P.   LOS: 1 day   Lewie Chamber, MD [facility] 03/18/2024,  5:40 PM

## 2024-03-18 NOTE — Hospital Course (Signed)
 Shelly Larson is a 53 yo female with PMH gastric sleeve with LOA, colon cancer s/p partial resection, DM II, HTN, GERD, anxiety who presented with abdominal pain. CT abdomen/pelvis on admission showed concern for SBO involving proximal small bowel.  She was placed on bowel rest and admitted for general surgery evaluation.  NG tube also inserted. She had slow improvement and underwent small bowel follow-through study with contrast noted to advance to the colon and rectum.  NG tube removed on 03/19/2024.  Diet slowly advanced per surgery.

## 2024-03-18 NOTE — Assessment & Plan Note (Signed)
-   Home regimen resumed 

## 2024-03-18 NOTE — Assessment & Plan Note (Addendum)
 Repleted.

## 2024-03-18 NOTE — Assessment & Plan Note (Signed)
-   s/p hx gastric sleeve

## 2024-03-18 NOTE — Assessment & Plan Note (Signed)
 Repleted.

## 2024-03-18 NOTE — Progress Notes (Signed)
   03/18/24 1452  TOC Brief Assessment  Insurance and Status Reviewed  Patient has primary care physician Yes  Home environment has been reviewed Home w/ spouse  Prior level of function: Independent  Prior/Current Home Services No current home services  Social Drivers of Health Review SDOH reviewed no interventions necessary  Readmission risk has been reviewed Yes  Transition of care needs no transition of care needs at this time

## 2024-03-18 NOTE — Progress Notes (Signed)
 Progress Note     Subjective: Pt still passing a small amount of gas and had a small BM. Has not been able to ambulate much, frustrated with NGT not functioning well overnight.   Objective: Vital signs in last 24 hours: Temp:  [97.6 F (36.4 C)-98.2 F (36.8 C)] 97.8 F (36.6 C) (03/25 0631) Pulse Rate:  [62-95] 62 (03/25 0631) Resp:  [16-20] 18 (03/25 0631) BP: (130-151)/(82-110) 131/82 (03/25 0631) SpO2:  [93 %-98 %] 94 % (03/25 0631) Weight:  [102.7 kg] 102.7 kg (03/24 1736) Last BM Date : 03/17/24  Intake/Output from previous day: 03/24 0701 - 03/25 0700 In: 115.9 [I.V.:21; IV Piggyback:94.9] Out: 200 [Emesis/NG output:200] Intake/Output this shift: No intake/output data recorded.  PE: General: pleasant, WD, obese female who is laying in bed in NAD Heart: regular, rate, and rhythm.   Lungs: Respiratory effort nonlabored Abd: soft, mild upper abdominal ttp without peritonitis, some fullness to palpation of upper abdomen but lower abdomen is very soft, NGT with thin bilious drainage Psych: A&Ox3 with an appropriate affect.   Lab Results:  Recent Labs    03/17/24 0947 03/18/24 0539  WBC 4.0 4.9  HGB 13.8 13.4  HCT 43.9 43.6  PLT 256 251   BMET Recent Labs    03/17/24 0947 03/18/24 0539  NA 136 137  K 3.1* 3.6  CL 102 106  CO2 26 27  GLUCOSE 102* 81  BUN 16 14  CREATININE 0.92 0.91  CALCIUM 8.6* 8.4*   PT/INR No results for input(s): "LABPROT", "INR" in the last 72 hours. CMP     Component Value Date/Time   NA 137 03/18/2024 0539   K 3.6 03/18/2024 0539   CL 106 03/18/2024 0539   CO2 27 03/18/2024 0539   GLUCOSE 81 03/18/2024 0539   BUN 14 03/18/2024 0539   BUN 9 05/03/2018 0000   CREATININE 0.91 03/18/2024 0539   CREATININE 1.01 03/10/2024 1422   CALCIUM 8.4 (L) 03/18/2024 0539   PROT 5.6 (L) 03/18/2024 0539   ALBUMIN 3.0 (L) 03/18/2024 0539   AST 12 (L) 03/18/2024 0539   ALT 19 03/18/2024 0539   ALKPHOS 70 03/18/2024 0539   BILITOT  0.7 03/18/2024 0539   GFRNONAA >60 03/18/2024 0539   GFRAA >60 10/21/2018 1838   Lipase     Component Value Date/Time   LIPASE 32 02/28/2024 0013       Studies/Results: DG Abdomen 1 View Result Date: 03/17/2024 CLINICAL DATA:  Enteric tube placement. EXAM: ABDOMEN - 1 VIEW COMPARISON:  CT abdomen pelvis from same day. FINDINGS: Enteric tube looped in the gastric fundus with the tip near the pylorus. Unchanged dilated small bowel loops. IMPRESSION: 1. Enteric tube looped in the gastric fundus with the tip near the pylorus. Electronically Signed   By: Obie Dredge M.D.   On: 03/17/2024 18:04   CT ABDOMEN PELVIS W CONTRAST Result Date: 03/17/2024 CLINICAL DATA:  Bowel obstruction suspected. Abdominal pain. Nausea. EXAM: CT ABDOMEN AND PELVIS WITH CONTRAST TECHNIQUE: Multidetector CT imaging of the abdomen and pelvis was performed using the standard protocol following bolus administration of intravenous contrast. RADIATION DOSE REDUCTION: This exam was performed according to the departmental dose-optimization program which includes automated exposure control, adjustment of the mA and/or kV according to patient size and/or use of iterative reconstruction technique. CONTRAST:  OMNIPAQUE IOHEXOL 300 MG/ML  SOLN COMPARISON:  CT scan abdomen and pelvis from 03/13/2024. FINDINGS: Lower chest: There are patchy atelectatic changes in the visualized lung bases.  No overt consolidation. No pleural effusion. The heart is normal in size. No pericardial effusion. Hepatobiliary: The liver is normal in size. Non-cirrhotic configuration. No suspicious mass. No intrahepatic or extrahepatic bile duct dilation. Gallbladder is surgically absent. Pancreas: Unremarkable. No pancreatic ductal dilatation or surrounding inflammatory changes. Spleen: Within normal limits. No focal lesion. Adrenals/Urinary Tract: Adrenal glands are unremarkable. No suspicious renal mass. No hydronephrosis. No renal or ureteric calculi.  Urinary bladder is under distended, precluding optimal assessment. However, no large mass or stones identified. No perivesical fat stranding. Stomach/Bowel: Patient is status post sleeve gastrectomy. Redemonstration of gaseous dilation of multiple proximal small bowel loops measuring up to 4.1 cm in diameter. The point of transition is in the mid abdomen (series 2, image 56). When compared to the prior exam, there is minimal interval decrease in the degree of small-bowel dilation. Findings are likely secondary to underlying adhesions. Distal small bowel loops and colon are nondilated. Unremarkable appendix. No abnormal bowel wall thickening or surrounding fat stranding. Vascular/Lymphatic: No ascites or pneumoperitoneum. No abdominal or pelvic lymphadenopathy, by size criteria. No aneurysmal dilation of the major abdominal arteries. Reproductive: Redemonstration of bulky uterus with multiple probable uterine leiomyomas. No large adnexal mass. Other: Midline surgical scar noted. The soft tissues and abdominal wall are otherwise unremarkable. Musculoskeletal: No suspicious osseous lesions. There are mild multilevel degenerative changes in the visualized spine. IMPRESSION: 1. Redemonstration of small-bowel obstruction with minimal interval decrease in the degree of dilation of the proximal small bowel loops. No evidence of bowel ischemia or pneumoperitoneum. 2. Multiple other nonacute observations, as described above. Electronically Signed   By: Jules Schick M.D.   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 Andrey Campanile - CT 3/6 w/ Mild partial small bowel obstruction in the jejunum secondary to a transition zone in the left mid abdomen. There is some inflammation of loops of SB  - CT AP 3/20 with worsening mid small bowel obstruction  since prior exam with transition point in the anterior mid abdomen, no pneumatosis, free air or abscess, prior sleeve gastrectomy, previous colonic anastomosis in the distal transverse colon - CT yesterday with persistent SBO with minimal decrease in degree of proximal small bowel dilation, transition in mid abdomen - NGT placed yesterday and SBO protocol started this AM, 8h delay film should be around 1400  - if not progressing then may need to consider proceeding to OR for exploration, will continue to follow  - no indication for emergent surgical intervention at this time     FEN: NPO with ice chips, NGT to LIWS, IVF per [facility]  ID: none indicated VTE: ok to have SQH or LMWH from surgery standpoint      HTN - on multiple PO meds at baseline. PRN IV meds  T2DM - SSI   LOS: 1 day   I reviewed hospitalist notes, last 24 h vitals and pain scores, last 48 h intake and output, last 24 h labs and trends, and last 24 h imaging results.  This care required moderate level of medical decision making.    Juliet Rude, [facility] 03/18/2024, 10:45 AM Please see Amion for pager number during day hours 7:00am-4:30pm

## 2024-03-19 ENCOUNTER — Inpatient Hospital Stay (HOSPITAL_COMMUNITY)

## 2024-03-19 DIAGNOSIS — E66811 Obesity, class 1: Secondary | ICD-10-CM

## 2024-03-19 DIAGNOSIS — K56609 Unspecified intestinal obstruction, unspecified as to partial versus complete obstruction: Secondary | ICD-10-CM | POA: Diagnosis not present

## 2024-03-19 LAB — BASIC METABOLIC PANEL
Anion gap: 6 (ref 5–15)
BUN: 14 mg/dL (ref 6–20)
CO2: 29 mmol/L (ref 22–32)
Calcium: 8.8 mg/dL — ABNORMAL LOW (ref 8.9–10.3)
Chloride: 101 mmol/L (ref 98–111)
Creatinine, Ser: 0.85 mg/dL (ref 0.44–1.00)
GFR, Estimated: >60 mL/min (ref >60)
Glucose, Bld: 90 mg/dL (ref 70–99)
Potassium: 3.5 mmol/L (ref 3.5–5.1)
Sodium: 136 mmol/L (ref 135–145)

## 2024-03-19 LAB — GLUCOSE, CAPILLARY
Glucose-Capillary: 88 mg/dL (ref 70–99)
Glucose-Capillary: 85 mg/dL (ref 70–99)
Glucose-Capillary: 92 mg/dL (ref 70–99)
Glucose-Capillary: 113 mg/dL — ABNORMAL HIGH (ref 70–99)

## 2024-03-19 LAB — MAGNESIUM: Magnesium: 2 mg/dL (ref 1.7–2.4)

## 2024-03-19 MED ORDER — POTASSIUM CHLORIDE CRYS ER 20 MEQ PO TBCR
40.0000 meq | EXTENDED_RELEASE_TABLET | Freq: Once | ORAL | Status: AC
  Administered 2024-03-19: 40 meq via ORAL
  Filled 2024-03-19: qty 2

## 2024-03-19 MED ORDER — LABETALOL HCL 5 MG/ML IV SOLN
10.0000 mg | INTRAVENOUS | Status: DC | PRN
  Filled 2024-03-19: qty 4

## 2024-03-19 MED ORDER — HYDRALAZINE HCL 20 MG/ML IJ SOLN: 10.0000 mg | INTRAMUSCULAR | Status: DC | PRN

## 2024-03-19 NOTE — Progress Notes (Signed)
 Progress Note    Shelly Larson   YNW:[id number]  DOB: 08/27/1971  DOA: 03/17/2024     2 PCP: Allwardt, Crist Infante, PA-C  Initial CC: abdominal pain  Hospital Course: Ms. Bilski is a 53 yo female with PMH gastric sleeve with LOA, colon cancer s/p partial resection, DM II, HTN, GERD, anxiety who presented with abdominal pain. CT abdomen/pelvis on admission showed concern for SBO involving proximal small bowel.  She was placed on bowel rest and admitted for general surgery evaluation.  NG tube also inserted. She had slow improvement and underwent small bowel follow-through study with contrast noted to advance to the colon and rectum.  NG tube removed on 03/19/2024.  Diet slowly advanced per surgery.  Interval History:  No events overnight.  Still endorses having flatus and even a bowel movement.  Remains somewhat uncomfortable and still requiring pain control.  Denies any vomiting.  NG tube being removed this morning and continuing on clear liquids today.  Assessment and Plan: * SBO (small bowel obstruction) (HCC) - History of colon cancer with partial resection and gastric sleeve with LOA; now presents with abdominal pain and recurrent SBO noted on CT on admission -Nonoperative management being pursued for now.  Small bowel follow-through being performed by surgery and NG tube remains in place.  Some contrast noted to have made it to the colon and rectum on x-ray imaging on 03/18/2024 -Continue pain control; slightly better but still requiring Dilaudid - Diet advancement per surgery; started on CLD on 3/25 -NG tube removed 03/19/2024 per surgery  Hypocalcemia - Replete as needed  Hypokalemia - Replete as needed  Class 1 obesity - s/p hx gastric sleeve  Panic disorder (episodic paroxysmal anxiety) - Resuming home meds as able  Type 2 diabetes mellitus (HCC) - CBGs stable without treatment necessary  Essential hypertension - On scheduled Lopressor IV -As needed labetalol and  hydralazine also ordered   Old records reviewed in assessment of this patient  Antimicrobials:   DVT prophylaxis:  enoxaparin (LOVENOX) injection 40 mg Start: 03/17/24 2200   Code Status:   Code Status: Full Code  Mobility Assessment (Last 72 Hours)     Mobility Assessment     Row Name 03/19/24 0816 03/18/24 2000 03/18/24 1043 03/17/24 2040 03/17/24 1700   Does patient have an order for bedrest or is patient medically unstable No - Continue assessment No - Continue assessment No - Continue assessment No - Continue assessment No - Continue assessment   What is the highest level of mobility based on the progressive mobility assessment? Level 6 (Walks independently in room and hall) - Balance while walking in room without assist - Complete Level 6 (Walks independently in room and hall) - Balance while walking in room without assist - Complete Level 5 (Walks with assist in room/hall) - Balance while stepping forward/back and can walk in room with assist - Complete Level 6 (Walks independently in room and hall) - Balance while walking in room without assist - Complete Level 5 (Walks with assist in room/hall) - Balance while stepping forward/back and can walk in room with assist - Complete            Barriers to discharge: none Disposition Plan: Home HH orders placed:  Status is: Inpatient  Objective: Blood pressure (!) 148/100, pulse 82, temperature 98.4 F (36.9 C), temperature source Oral, resp. rate 16, height 5\' 11"  (1.803 m), weight 102.7 kg, SpO2 97%.  Examination:  Physical Exam Constitutional:  Appearance: Normal appearance.  HENT:     Head: Normocephalic and atraumatic.     Mouth/Throat:     Mouth: Mucous membranes are moist.  Eyes:     Extraocular Movements: Extraocular movements intact.  Cardiovascular:     Rate and Rhythm: Normal rate and regular rhythm.  Pulmonary:     Effort: Pulmonary effort is normal. No respiratory distress.     Breath sounds: Normal  breath sounds. No wheezing.  Abdominal:     General: Bowel sounds are normal. There is distension (mild).     Palpations: Abdomen is soft.     Tenderness: There is no abdominal tenderness.  Musculoskeletal:        General: Normal range of motion.     Cervical back: Normal range of motion and neck supple.  Skin:    General: Skin is warm and dry.  Neurological:     General: No focal deficit present.     Mental Status: She is alert.  Psychiatric:        Mood and Affect: Mood normal.      Consultants:  General surgery  Procedures:    Data Reviewed: Results for orders placed or performed during the hospital encounter of 03/17/24 (from the past 24 hours)  Glucose, capillary     Status: None   Collection Time: 03/18/24  5:54 PM  Result Value Ref Range   Glucose-Capillary 88 70 - 99 mg/dL  Glucose, capillary     Status: None   Collection Time: 03/19/24 12:59 AM  Result Value Ref Range   Glucose-Capillary 88 70 - 99 mg/dL  Glucose, capillary     Status: None   Collection Time: 03/19/24  5:31 AM  Result Value Ref Range   Glucose-Capillary 85 70 - 99 mg/dL  Basic metabolic panel     Status: Abnormal   Collection Time: 03/19/24  5:56 AM  Result Value Ref Range   Sodium 136 135 - 145 mmol/L   Potassium 3.5 3.5 - 5.1 mmol/L   Chloride 101 98 - 111 mmol/L   CO2 29 22 - 32 mmol/L   Glucose, Bld 90 70 - 99 mg/dL   BUN 14 6 - 20 mg/dL   Creatinine, Ser 6.04 0.44 - 1.00 mg/dL   Calcium 8.8 (L) 8.9 - 10.3 mg/dL   GFR, Estimated >54 >09 mL/min   Anion gap 6 5 - 15  Magnesium     Status: None   Collection Time: 03/19/24  5:56 AM  Result Value Ref Range   Magnesium 2.0 1.7 - 2.4 mg/dL  Glucose, capillary     Status: None   Collection Time: 03/19/24 11:56 AM  Result Value Ref Range   Glucose-Capillary 92 70 - 99 mg/dL   Comment 1 Notify RN    Comment 2 Document in Chart     I have reviewed pertinent nursing notes, vitals, labs, and images as necessary. I have ordered labwork to  follow up on as indicated.  I have reviewed the last notes from staff over past 24 hours. I have discussed patient's care plan and test results with nursing staff, CM/SW, and other staff as appropriate.  Time spent: Greater than 50% of the 55 minute visit was spent in counseling/coordination of care for the patient as laid out in the A&P.   LOS: 2 days   Lewie Chamber, MD [facility] 03/19/2024, 2:03 PM

## 2024-03-19 NOTE — Plan of Care (Signed)
  Problem: Education: Goal: Knowledge of General Education information will improve Description: Including pain rating scale, medication(s)/side effects and non-pharmacologic comfort measures Outcome: Progressing   Problem: Clinical Measurements: Goal: Ability to maintain clinical measurements within normal limits will improve Outcome: Progressing Goal: Will remain free from infection Outcome: Progressing Goal: Cardiovascular complication will be avoided Outcome: Progressing   Problem: Nutrition: Goal: Adequate nutrition will be maintained Outcome: Progressing   Problem: Coping: Goal: Level of anxiety will decrease Outcome: Progressing   Problem: Elimination: Goal: Will not experience complications related to bowel motility Outcome: Progressing Goal: Will not experience complications related to urinary retention Outcome: Progressing   Problem: Pain Managment: Goal: General experience of comfort will improve and/or be controlled Outcome: Progressing   Problem: Safety: Goal: Ability to remain free from injury will improve Outcome: Progressing   Problem: Skin Integrity: Goal: Risk for impaired skin integrity will decrease Outcome: Progressing

## 2024-03-19 NOTE — Progress Notes (Signed)
 Subjective/Chief Complaint: Tube clamped no N/V had 2 BMs contrast in the colon    Objective: Vital signs in last 24 hours: Temp:  [98.3 F (36.8 C)-98.4 F (36.9 C)] 98.4 F (36.9 C) (03/26 0533) Pulse Rate:  [60-63] 63 (03/25 2030) Resp:  [16-18] 16 (03/25 2030) BP: (120-145)/(82-90) 135/82 (03/26 0533) SpO2:  [91 %-97 %] 97 % (03/26 0533) Last BM Date : 03/18/24 (per pt)  Intake/Output from previous day: 03/25 0701 - 03/26 0700 In: 240 [P.O.:240] Out: 1200 [Emesis/NG output:1200] Intake/Output this shift: No intake/output data recorded.  Abd: soft NT ND   Lab Results:  Recent Labs    03/17/24 0947 03/18/24 0539  WBC 4.0 4.9  HGB 13.8 13.4  HCT 43.9 43.6  PLT 256 251   BMET Recent Labs    03/18/24 0539 03/19/24 0556  NA 137 136  K 3.6 3.5  CL 106 101  CO2 27 29  GLUCOSE 81 90  BUN 14 14  CREATININE 0.91 0.85  CALCIUM 8.4* 8.8*   PT/INR No results for input(s): "LABPROT", "INR" in the last 72 hours. ABG No results for input(s): "PHART", "HCO3" in the last 72 hours.  Invalid input(s): "PCO2", "PO2"  Studies/Results: DG Abd Portable 1V-Small Bowel Obstruction Protocol-initial, 8 hr delay Result Date: 03/18/2024 CLINICAL DATA:  Small bowel protocol, 8 hour delay EXAM: PORTABLE ABDOMEN - 1 VIEW COMPARISON:  03/17/2024, CT 03/17/2024 FINDINGS: Enteric tube tip and side port overlie the distal stomach. Decreased small bowel distension compared to prior. Enteral contrast is present within nondilated right lower quadrant small bowel, the colon and rectum. IMPRESSION: Decreased small bowel distension compared to prior. Enteral contrast is present within nondilated right lower quadrant small bowel, the colon and rectum. Electronically Signed   By: Jasmine Pang M.D.   On: 03/18/2024 15:50   DG Abdomen 1 View Result Date: 03/17/2024 CLINICAL DATA:  Enteric tube placement. EXAM: ABDOMEN - 1 VIEW COMPARISON:  CT abdomen pelvis from same day. FINDINGS: Enteric  tube looped in the gastric fundus with the tip near the pylorus. Unchanged dilated small bowel loops. IMPRESSION: 1. Enteric tube looped in the gastric fundus with the tip near the pylorus. Electronically Signed   By: Obie Dredge M.D.   On: 03/17/2024 18:04   CT ABDOMEN PELVIS W CONTRAST Result Date: 03/17/2024 CLINICAL DATA:  Bowel obstruction suspected. Abdominal pain. Nausea. EXAM: CT ABDOMEN AND PELVIS WITH CONTRAST TECHNIQUE: Multidetector CT imaging of the abdomen and pelvis was performed using the standard protocol following bolus administration of intravenous contrast. RADIATION DOSE REDUCTION: This exam was performed according to the departmental dose-optimization program which includes automated exposure control, adjustment of the mA and/or kV according to patient size and/or use of iterative reconstruction technique. CONTRAST:  OMNIPAQUE IOHEXOL 300 MG/ML  SOLN COMPARISON:  CT scan abdomen and pelvis from 03/13/2024. FINDINGS: Lower chest: There are patchy atelectatic changes in the visualized lung bases. No overt consolidation. No pleural effusion. The heart is normal in size. No pericardial effusion. Hepatobiliary: The liver is normal in size. Non-cirrhotic configuration. No suspicious mass. No intrahepatic or extrahepatic bile duct dilation. Gallbladder is surgically absent. Pancreas: Unremarkable. No pancreatic ductal dilatation or surrounding inflammatory changes. Spleen: Within normal limits. No focal lesion. Adrenals/Urinary Tract: Adrenal glands are unremarkable. No suspicious renal mass. No hydronephrosis. No renal or ureteric calculi. Urinary bladder is under distended, precluding optimal assessment. However, no large mass or stones identified. No perivesical fat stranding. Stomach/Bowel: Patient is status post sleeve gastrectomy. Redemonstration  of gaseous dilation of multiple proximal small bowel loops measuring up to 4.1 cm in diameter. The point of transition is in the mid  abdomen (series 2, image 56). When compared to the prior exam, there is minimal interval decrease in the degree of small-bowel dilation. Findings are likely secondary to underlying adhesions. Distal small bowel loops and colon are nondilated. Unremarkable appendix. No abnormal bowel wall thickening or surrounding fat stranding. Vascular/Lymphatic: No ascites or pneumoperitoneum. No abdominal or pelvic lymphadenopathy, by size criteria. No aneurysmal dilation of the major abdominal arteries. Reproductive: Redemonstration of bulky uterus with multiple probable uterine leiomyomas. No large adnexal mass. Other: Midline surgical scar noted. The soft tissues and abdominal wall are otherwise unremarkable. Musculoskeletal: No suspicious osseous lesions. There are mild multilevel degenerative changes in the visualized spine. IMPRESSION: 1. Redemonstration of small-bowel obstruction with minimal interval decrease in the degree of dilation of the proximal small bowel loops. No evidence of bowel ischemia or pneumoperitoneum. 2. Multiple other nonacute observations, as described above. Electronically Signed   By: Jules Schick M.D.   On: 03/17/2024 13:17    Anti-infectives: Anti-infectives (From admission, onward)    None       Assessment/Plan:    pSBO Hx of constipation managed on Linzess H/o lap sleeve gastrectomy with LOA for 30 min 10/15/23 Dr. Andrey Campanile - CT 3/6 w/ Mild partial small bowel obstruction in the jejunum secondary to a transition zone in the left mid abdomen. There is some inflammation of loops of SB  - CT AP 3/20 with worsening mid small bowel obstruction since prior exam with transition point in the anterior mid abdomen, no pneumatosis, free air or abscess, prior sleeve gastrectomy, previous colonic anastomosis in the distal transverse colon   Contrast in  the colon  Stay on clears Remove NGT    FEN:  IVF per [facility]  ID: none indicated VTE: ok to have SQH or LMWH from surgery standpoint       HTN - on multiple PO meds at baseline. PRN IV meds  T2DM - SSI    Cl[place] [facility] 03/19/2024 Low complexity

## 2024-03-19 NOTE — Plan of Care (Signed)
  Problem: Activity: Goal: Risk for activity intolerance will decrease Outcome: Progressing   Problem: Coping: Goal: Level of anxiety will decrease Outcome: Progressing   Problem: Elimination: Goal: Will not experience complications related to bowel motility Outcome: Progressing   Problem: Pain Managment: Goal: General experience of comfort will improve and/or be controlled Outcome: Progressing   Problem: Safety: Goal: Ability to remain free from injury will improve Outcome: Progressing   Problem: Skin Integrity: Goal: Risk for impaired skin integrity will decrease Outcome: Progressing

## 2024-03-20 DIAGNOSIS — K56609 Unspecified intestinal obstruction, unspecified as to partial versus complete obstruction: Secondary | ICD-10-CM | POA: Diagnosis not present

## 2024-03-20 DIAGNOSIS — I10 Essential (primary) hypertension: Secondary | ICD-10-CM | POA: Diagnosis not present

## 2024-03-20 LAB — GLUCOSE, CAPILLARY
Glucose-Capillary: 71 mg/dL (ref 70–99)
Glucose-Capillary: 78 mg/dL (ref 70–99)
Glucose-Capillary: 79 mg/dL (ref 70–99)
Glucose-Capillary: 92 mg/dL (ref 70–99)
Glucose-Capillary: 94 mg/dL (ref 70–99)

## 2024-03-20 MED ORDER — METOPROLOL SUCCINATE ER 50 MG PO TB24
50.0000 mg | ORAL_TABLET | Freq: Every morning | ORAL | Status: DC
  Administered 2024-03-20 – 2024-03-21 (×2): 50 mg via ORAL
  Filled 2024-03-20: qty 1

## 2024-03-20 MED ORDER — HYDROCHLOROTHIAZIDE 25 MG PO TABS
25.0000 mg | ORAL_TABLET | Freq: Every day | ORAL | Status: DC
  Administered 2024-03-20 – 2024-03-21 (×2): 25 mg via ORAL
  Filled 2024-03-20 (×2): qty 1

## 2024-03-20 MED ORDER — LINACLOTIDE 145 MCG PO CAPS
290.0000 ug | ORAL_CAPSULE | Freq: Every day | ORAL | Status: DC
  Administered 2024-03-20 – 2024-03-21 (×2): 290 ug via ORAL
  Filled 2024-03-20 (×2): qty 2

## 2024-03-20 MED ORDER — AMLODIPINE BESYLATE 10 MG PO TABS
10.0000 mg | ORAL_TABLET | Freq: Every day | ORAL | Status: DC
  Administered 2024-03-21: 10 mg via ORAL
  Filled 2024-03-20: qty 1

## 2024-03-20 MED ORDER — LOSARTAN POTASSIUM-HCTZ 100-25 MG PO TABS: 1.0000 | ORAL_TABLET | Freq: Every day | ORAL | Status: DC

## 2024-03-20 MED ORDER — LOSARTAN POTASSIUM 50 MG PO TABS
100.0000 mg | ORAL_TABLET | Freq: Every day | ORAL | Status: DC
  Administered 2024-03-20 – 2024-03-21 (×2): 100 mg via ORAL
  Filled 2024-03-20 (×2): qty 2

## 2024-03-20 MED ORDER — CLONAZEPAM 0.5 MG PO TABS: 0.5000 mg | ORAL_TABLET | Freq: Two times a day (BID) | ORAL | Status: DC | PRN

## 2024-03-20 MED ORDER — POLYETHYLENE GLYCOL 3350 17 G PO PACK
17.0000 g | PACK | Freq: Every day | ORAL | Status: DC
  Administered 2024-03-20: 17 g via ORAL
  Filled 2024-03-20: qty 1

## 2024-03-20 MED ORDER — AMITRIPTYLINE HCL 25 MG PO TABS
75.0000 mg | ORAL_TABLET | Freq: Every day | ORAL | Status: DC
  Administered 2024-03-20: 75 mg via ORAL
  Filled 2024-03-20: qty 3

## 2024-03-20 NOTE — Plan of Care (Signed)

## 2024-03-20 NOTE — Progress Notes (Signed)
 Progress Note     Subjective: Pt tolerating CLD and still having bowel function. Denies vomiting. Reports some abdominal pain but generally improved after having bowel function. Abdomen overall feels softer.   Objective: Vital signs in last 24 hours: Temp:  [98.4 F (36.9 C)-98.9 F (37.2 C)] 98.6 F (37 C) (03/27 0415) Pulse Rate:  [64-82] 64 (03/27 0415) Resp:  [18] 18 (03/27 0415) BP: (135-154)/(83-100) 154/83 (03/27 0415) SpO2:  [95 %-98 %] 95 % (03/27 0415) Last BM Date : 03/19/24  Intake/Output from previous day: 03/26 0701 - 03/27 0700 In: 240 [P.O.:240] Out: -  Intake/Output this shift: No intake/output data recorded.  PE: General: pleasant, WD, obese female who is laying in bed in NAD Heart: regular, rate, and rhythm.   Lungs: Respiratory effort nonlabored Abd: soft, NT, ND Psych: A&Ox3 with an appropriate affect.   Lab Results:  Recent Labs    03/18/24 0539  WBC 4.9  HGB 13.4  HCT 43.6  PLT 251   BMET Recent Labs    03/18/24 0539 03/19/24 0556  NA 137 136  K 3.6 3.5  CL 106 101  CO2 27 29  GLUCOSE 81 90  BUN 14 14  CREATININE 0.91 0.85  CALCIUM 8.4* 8.8*   PT/INR No results for input(s): "LABPROT", "INR" in the last 72 hours. CMP     Component Value Date/Time   NA 136 03/19/2024 0556   K 3.5 03/19/2024 0556   CL 101 03/19/2024 0556   CO2 29 03/19/2024 0556   GLUCOSE 90 03/19/2024 0556   BUN 14 03/19/2024 0556   BUN 9 05/03/2018 0000   CREATININE 0.85 03/19/2024 0556   CREATININE 1.01 03/10/2024 1422   CALCIUM 8.8 (L) 03/19/2024 0556   PROT 5.6 (L) 03/18/2024 0539   ALBUMIN 3.0 (L) 03/18/2024 0539   AST 12 (L) 03/18/2024 0539   ALT 19 03/18/2024 0539   ALKPHOS 70 03/18/2024 0539   BILITOT 0.7 03/18/2024 0539   GFRNONAA >60 03/19/2024 0556   GFRAA >60 10/21/2018 1838   Lipase     Component Value Date/Time   LIPASE 32 02/28/2024 0013       Studies/Results: DG Abd Portable 1V Result Date: 03/19/2024 CLINICAL DATA:   Small bowel obstruction EXAM: PORTABLE ABDOMEN - 1 VIEW COMPARISON:  03/18/2024 FINDINGS: NG tube remains in place with the tip in the distal stomach or proximal duodenum. Oral contrast material within the colon. Mildly prominent left abdominal small bowel loops noted. Prior cholecystectomy. No free air or organomegaly. IMPRESSION: Continued mildly dilated left abdominal small bowel loops. Contrast material now noted within the colon. Findings could reflect continued partial small bowel obstruction pattern. Electronically Signed   By: Charlett Nose M.D.   On: 03/19/2024 11:15   DG Abd Portable 1V-Small Bowel Obstruction Protocol-initial, 8 hr delay Result Date: 03/18/2024 CLINICAL DATA:  Small bowel protocol, 8 hour delay EXAM: PORTABLE ABDOMEN - 1 VIEW COMPARISON:  03/17/2024, CT 03/17/2024 FINDINGS: Enteric tube tip and side port overlie the distal stomach. Decreased small bowel distension compared to prior. Enteral contrast is present within nondilated right lower quadrant small bowel, the colon and rectum. IMPRESSION: Decreased small bowel distension compared to prior. Enteral contrast is present within nondilated right lower quadrant small bowel, the colon and rectum. Electronically Signed   By: Jasmine Pang M.D.   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 Andrey Campanile - CT 3/6 w/ Mild partial small bowel obstruction in the jejunum secondary to a transition zone in the left mid abdomen. There is some inflammation of loops of SB  - CT AP 3/20 with worsening mid small bowel obstruction since prior exam with transition point in the anterior mid abdomen, no pneumatosis, free air or abscess, prior sleeve gastrectomy, previous colonic anastomosis in the distal transverse colon - tolerating CLD and having bowel function - advance  to soft diet this AM - restart home linzess and add miralax - continue mobilization  - keep K> 4.0 and Mg >2.0  - no indication for emergent surgical intervention at this time  - may be ready for discharge home tomorrow if tolerating soft diet and having bowel function    FEN: soft diet  ID: none indicated VTE: LMWH     HTN - ok to resume home meds from surgery standpoint  T2DM - SSI    LOS: 3 days   I reviewed hospitalist notes, last 24 h vitals and pain scores, last 48 h intake and output, last 24 h labs and trends, and last 24 h imaging results.  This care required moderate level of medical decision making.    Juliet Rude, PA-C [facility] 03/20/2024, 11:00 AM Please see Amion for pager number during day hours 7:00am-4:30pm

## 2024-03-20 NOTE — Progress Notes (Signed)
 Progress Note    Shelly Larson   DGL:[id number]  DOB: 1971-03-18  DOA: 03/17/2024     3 PCP: Allwardt, Crist Infante, PA-C  Initial CC: abdominal pain  Hospital Course: Shelly Larson is a 53 yo female with PMH gastric sleeve with LOA, colon cancer s/p partial resection, DM II, HTN, GERD, anxiety who presented with abdominal pain. CT abdomen/pelvis on admission showed concern for SBO involving proximal small bowel.  She was placed on bowel rest and admitted for general surgery evaluation.  NG tube also inserted. She had slow improvement and underwent small bowel follow-through study with contrast noted to advance to the colon and rectum.  NG tube removed on 03/19/2024.  Diet slowly advanced per surgery.  Interval History:  No events overnight.  Slow improvement in pain.  She has been ambulating.  Still endorses flatus and bowel movements.  Tolerating liquid diet and being advanced to soft diet today per surgery.  Assessment and Plan: * SBO (small bowel obstruction) (HCC) - History of colon cancer with partial resection and gastric sleeve with LOA; now presents with abdominal pain and recurrent SBO noted on CT on admission -Nonoperative management being pursued for now.  Small bowel follow-through being performed by surgery and NG tube remains in place.  Some contrast noted to have made it to the colon and rectum on x-ray imaging on 03/18/2024 -Continue pain control; slightly better but still requiring Dilaudid - Diet advancement per surgery; started on CLD on 3/25 -NG tube removed 03/19/2024 per surgery -Advanced to soft diet today  Hypocalcemia - Replete as needed  Hypokalemia - Replete as needed  Class 1 obesity - s/p hx gastric sleeve  Panic disorder (episodic paroxysmal anxiety) - Resuming home meds as able  Type 2 diabetes mellitus (HCC) - CBGs stable without treatment necessary  Essential hypertension - On scheduled Lopressor IV -As needed labetalol and hydralazine also  ordered   Old records reviewed in assessment of this patient  Antimicrobials:   DVT prophylaxis:  enoxaparin (LOVENOX) injection 40 mg Start: 03/17/24 2200   Code Status:   Code Status: Full Code  Mobility Assessment (Last 72 Hours)     Mobility Assessment     Row Name 03/20/24 0900 03/19/24 2100 03/19/24 0816 03/18/24 2000 03/18/24 1043   Does patient have an order for bedrest or is patient medically unstable No - Continue assessment No - Continue assessment No - Continue assessment No - Continue assessment No - Continue assessment   What is the highest level of mobility based on the progressive mobility assessment? Level 6 (Walks independently in room and hall) - Balance while walking in room without assist - Complete Level 6 (Walks independently in room and hall) - Balance while walking in room without assist - Complete Level 6 (Walks independently in room and hall) - Balance while walking in room without assist - Complete Level 6 (Walks independently in room and hall) - Balance while walking in room without assist - Complete Level 5 (Walks with assist in room/hall) - Balance while stepping forward/back and can walk in room with assist - Complete    Row Name 03/17/24 2040 03/17/24 1700         Does patient have an order for bedrest or is patient medically unstable No - Continue assessment No - Continue assessment      What is the highest level of mobility based on the progressive mobility assessment? Level 6 (Walks independently in room and hall) - Balance while walking in  room without assist - Complete Level 5 (Walks with assist in room/hall) - Balance while stepping forward/back and can walk in room with assist - Complete               Barriers to discharge: none Disposition Plan: Home HH orders placed:  Status is: Inpatient  Objective: Blood pressure (!) 144/88, pulse (!) 58, temperature 98.7 F (37.1 C), temperature source Oral, resp. rate 18, height 5\' 11"  (1.803 m),  weight 102.7 kg, SpO2 99%.  Examination:  Physical Exam Constitutional:      Appearance: Normal appearance.  HENT:     Head: Normocephalic and atraumatic.     Mouth/Throat:     Mouth: Mucous membranes are moist.  Eyes:     Extraocular Movements: Extraocular movements intact.  Cardiovascular:     Rate and Rhythm: Normal rate and regular rhythm.  Pulmonary:     Effort: Pulmonary effort is normal. No respiratory distress.     Breath sounds: Normal breath sounds. No wheezing.  Abdominal:     General: Bowel sounds are decreased. There is no distension.     Palpations: Abdomen is soft.     Tenderness: There is no abdominal tenderness.  Musculoskeletal:        General: Normal range of motion.     Cervical back: Normal range of motion and neck supple.  Skin:    General: Skin is warm and dry.  Neurological:     General: No focal deficit present.     Mental Status: She is alert.  Psychiatric:        Mood and Affect: Mood normal.      Consultants:  General surgery  Procedures:    Data Reviewed: Results for orders placed or performed during the hospital encounter of 03/17/24 (from the past 24 hours)  Glucose, capillary     Status: Abnormal   Collection Time: 03/19/24  6:01 PM  Result Value Ref Range   Glucose-Capillary 113 (H) 70 - 99 mg/dL   Comment 1 Notify RN    Comment 2 Document in Chart   Glucose, capillary     Status: None   Collection Time: 03/20/24 12:18 AM  Result Value Ref Range   Glucose-Capillary 78 70 - 99 mg/dL  Glucose, capillary     Status: None   Collection Time: 03/20/24  6:16 AM  Result Value Ref Range   Glucose-Capillary 79 70 - 99 mg/dL  Glucose, capillary     Status: None   Collection Time: 03/20/24 12:20 PM  Result Value Ref Range   Glucose-Capillary 94 70 - 99 mg/dL   Comment 1 Notify RN    Comment 2 Document in Chart     I have reviewed pertinent nursing notes, vitals, labs, and images as necessary. I have ordered labwork to follow up on as  indicated.  I have reviewed the last notes from staff over past 24 hours. I have discussed patient's care plan and test results with nursing staff, CM/SW, and other staff as appropriate.  Time spent: Greater than 50% of the 55 minute visit was spent in counseling/coordination of care for the patient as laid out in the A&P.   LOS: 3 days   Lewie Chamber, MD [facility] 03/20/2024, 1:03 PM

## 2024-03-21 DIAGNOSIS — K56609 Unspecified intestinal obstruction, unspecified as to partial versus complete obstruction: Secondary | ICD-10-CM | POA: Diagnosis not present

## 2024-03-21 LAB — GLUCOSE, CAPILLARY
Glucose-Capillary: 85 mg/dL (ref 70–99)
Glucose-Capillary: 88 mg/dL (ref 70–99)

## 2024-03-21 MED ORDER — HYDROMORPHONE HCL 1 MG/ML IJ SOLN: 1.0000 mg | INTRAMUSCULAR | Status: DC | PRN

## 2024-03-21 MED ORDER — OXYCODONE HCL 5 MG PO TABS: 5.0000 mg | ORAL_TABLET | ORAL | Status: DC | PRN

## 2024-03-21 MED ORDER — ONDANSETRON 4 MG PO TBDP: 4.0000 mg | ORAL_TABLET | Freq: Four times a day (QID) | ORAL | 0 refills | Status: AC | PRN

## 2024-03-21 MED ORDER — OXYCODONE HCL 5 MG PO TABS: 5.0000 mg | ORAL_TABLET | ORAL | 0 refills | Status: AC | PRN

## 2024-03-21 NOTE — Progress Notes (Signed)
   Subjective/Chief Complaint: Patient with crampy pain last night but improved with passing of flatus.  No nausea vomiting.  Tolerating diet.   Objective: Vital signs in last 24 hours: Temp:  [98 F (36.7 C)-98.7 F (37.1 C)] 98.2 F (36.8 C) (03/28 0434) Pulse Rate:  [58-87] 87 (03/28 0434) Resp:  [18] 18 (03/28 0434) BP: (134-159)/(85-99) 134/85 (03/28 0434) SpO2:  [95 %-99 %] 95 % (03/28 0434) Last BM Date : 03/20/24  Intake/Output from previous day: No intake/output data recorded. Intake/Output this shift: Total I/O In: 118 [P.O.:118] Out: -   Abdomen: Soft nontender without rebound guarding or distention  Lab Results:  No results for input(s): "WBC", "HGB", "HCT", "PLT" in the last 72 hours. BMET Recent Labs    03/19/24 0556  NA 136  K 3.5  CL 101  CO2 29  GLUCOSE 90  BUN 14  CREATININE 0.85  CALCIUM 8.8*   PT/INR No results for input(s): "LABPROT", "INR" in the last 72 hours. ABG No results for input(s): "PHART", "HCO3" in the last 72 hours.  Invalid input(s): "PCO2", "PO2"  Studies/Results: No results found.  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 Andrey Campanile - CT 3/6 w/ Mild partial small bowel obstruction in the jejunum secondary to a transition zone in the left mid abdomen. There is some inflammation of loops of SB  - CT AP 3/20 with worsening mid small bowel obstruction since prior exam with transition point in the anterior mid abdomen, no pneumatosis, free air or abscess, prior sleeve gastrectomy, previous colonic anastomosis in the distal transverse colon -She is tolerating soft diet  Okay for discharge from surgery standpoint      FEN: soft diet  ID: none indicated VTE: LMWH     HTN - ok to resume home meds from surgery standpoint  T2DM - SSI    LOS: 4 days    Shelly Larson 03/21/2024

## 2024-03-21 NOTE — Discharge Summary (Signed)
 Physician Discharge Summary   Shelly Larson BMW:[id number] DOB: 24-Feb-1971 DOA: 03/17/2024  PCP: Bary Leriche, PA-C  Admit date: 03/17/2024 Discharge date: 03/21/2024   Admitted From: Home Disposition:  Home Discharging physician: Lewie Chamber, MD Barriers to discharge: none   Discharge Condition: stable CODE STATUS: Full Diet recommendation:  Diet Orders (From admission, onward)     Start     Ordered   03/20/24 1112  DIET SOFT Room service appropriate? Yes; Fluid consistency: Thin  Diet effective now       Question Answer Comment  Room service appropriate? Yes   Fluid consistency: Thin      03/20/24 1111            Hospital Course: Shelly Larson is a 53 yo female with PMH gastric sleeve with LOA, colon cancer s/p partial resection, DM II, HTN, GERD, anxiety who presented with abdominal pain. CT abdomen/pelvis on admission showed concern for SBO involving proximal small bowel.  She was placed on bowel rest and admitted for general surgery evaluation.  NG tube also inserted. She had slow improvement and underwent small bowel follow-through study with contrast noted to advance to the colon and rectum.  NG tube removed on 03/19/2024.  Diet slowly advanced per surgery.  Assessment and Plan: * SBO (small bowel obstruction) (HCC) - History of colon cancer with partial resection and gastric sleeve with LOA; now presents with abdominal pain and recurrent SBO noted on CT on admission -Nonoperative management being pursued for now.  Small bowel follow-through being performed by surgery and NG tube remains in place.  Some contrast noted to have made it to the colon and rectum on x-ray imaging on 03/18/2024 -Continue pain control; slightly better but still requiring Dilaudid - Diet advancement per surgery; started on CLD on 3/25 -NG tube removed 03/19/2024 per surgery -Advanced to soft diet 3/27 and tolerated well - patient stable for d/c home per surgery - refilled zofran and gave  short course oxy PRN  Hypocalcemia - Repleted  Hypokalemia - Repleted  Class 1 obesity - s/p hx gastric sleeve  Panic disorder (episodic paroxysmal anxiety) Home regimen resumed  Type 2 diabetes mellitus (HCC) Home regimen resumed  Essential hypertension - Home regimen resumed   The patient's acute and chronic medical conditions were treated accordingly. On day of discharge, patient was felt deemed stable for discharge. Patient/family member advised to call PCP or come back to ER if needed.   Principal Diagnosis: SBO (small bowel obstruction) (HCC)  Discharge Diagnoses: Active Hospital Problems   Diagnosis Date Noted   SBO (small bowel obstruction) (HCC) 02/28/2024    Priority: 1.   Class 1 obesity 03/17/2024   Hypokalemia 03/17/2024   Hypocalcemia 03/17/2024   Panic disorder (episodic paroxysmal anxiety) 01/09/2023   Type 2 diabetes mellitus (HCC) 08/31/2021   Essential hypertension 07/09/2018    Resolved Hospital Problems  No resolved problems to display.     Discharge Instructions     Increase activity slowly   Complete by: As directed       Allergies as of 03/21/2024       Reactions   Lisinopril Cough        Medication List     TAKE these medications    Aimovig 140 MG/ML Soaj Generic drug: Erenumab-aooe Inject 140 mg into the skin every 28 (twenty-eight) days.   albuterol 108 (90 Base) MCG/ACT inhaler Commonly known as: VENTOLIN HFA Inhale 2 puffs into the lungs every 4 (four) hours as needed  for wheezing or shortness of breath.   amitriptyline 25 MG tablet Commonly known as: ELAVIL Take 3 tablets (75 mg total) by mouth at bedtime.   amLODipine 10 MG tablet Commonly known as: NORVASC TAKE 1 TABLET BY MOUTH EVERY DAY   amphetamine-dextroamphetamine 15 MG tablet Commonly known as: Adderall Take 1 tab po around 2 pm What changed:  how much to take how to take this when to take this reasons to take this additional instructions    amphetamine-dextroamphetamine 30 MG 24 hr capsule Commonly known as: Adderall XR Take 1 capsule (30 mg total) by mouth every morning. What changed: Another medication with the same name was changed. Make sure you understand how and when to take each.   clonazePAM 0.5 MG tablet Commonly known as: KLONOPIN Take 1 tablet (0.5 mg total) by mouth 2 (two) times daily.   hydrocortisone 2.5 % rectal cream Commonly known as: ANUSOL-HC Place 1 Application rectally 2 (two) times daily. What changed:  when to take this reasons to take this   Linzess 290 MCG Caps capsule Generic drug: linaclotide TAKE 1 CAPSULE BY MOUTH DAILY BEFORE BREAKFAST.   losartan-hydrochlorothiazide 100-25 MG tablet Commonly known as: HYZAAR TAKE 1 TABLET BY MOUTH EVERY DAY   metoprolol succinate 50 MG 24 hr tablet Commonly known as: TOPROL-XL Take 50 mg by mouth every morning.   Nurtec 75 MG Tbdp Generic drug: Rimegepant Sulfate Take 1 tablet (75 mg total) by mouth daily as needed.   omeprazole 40 MG capsule Commonly known as: PRILOSEC Take 1 capsule (40 mg total) by mouth daily. What changed: when to take this   ondansetron 4 MG disintegrating tablet Commonly known as: ZOFRAN-ODT Take 1 tablet (4 mg total) by mouth every 6 (six) hours as needed for up to 7 days for nausea or vomiting.   oxyCODONE 5 MG immediate release tablet Commonly known as: Oxy IR/ROXICODONE Take 1 tablet (5 mg total) by mouth every 6 (six) hours as needed for breakthrough pain or severe pain (pain score 7-10). What changed: Another medication with the same name was added. Make sure you understand how and when to take each.   oxyCODONE 5 MG immediate release tablet Commonly known as: Oxy IR/ROXICODONE Take 1 tablet (5 mg total) by mouth every 4 (four) hours as needed for moderate pain (pain score 4-6) or severe pain (pain score 7-10). What changed: You were already taking a medication with the same name, and this prescription was  added. Make sure you understand how and when to take each.   pantoprazole 40 MG tablet Commonly known as: PROTONIX Take 40 mg by mouth daily.   promethazine 12.5 MG tablet Commonly known as: PHENERGAN Take 1 tablet (12.5 mg total) by mouth every 6 (six) hours as needed for nausea or vomiting.   Safety Seal Miscellaneous Misc Apply 1 Application topically at bedtime. Medication Name: Melaxemic Cream (Tranexamic Acid, Kojic Acid, Vit C, Hydrocortisone,Hyaluronic Acid)   Zavzpret 10 MG/ACT Soln Generic drug: Zavegepant HCl Place 10 mg into the nose as needed (for migraine).        Allergies  Allergen Reactions   Lisinopril Cough    Consultations: General surgery  Procedures:   Discharge Exam: BP 134/85 (BP Location: Left Arm)   Pulse 87   Temp 98.2 F (36.8 C)   Resp 18   Ht 5\' 11"  (1.803 m)   Wt 102.7 kg   SpO2 95%   BMI 31.58 kg/m  Physical Exam Constitutional:  Appearance: Normal appearance.  HENT:     Head: Normocephalic and atraumatic.     Mouth/Throat:     Mouth: Mucous membranes are moist.  Eyes:     Extraocular Movements: Extraocular movements intact.  Cardiovascular:     Rate and Rhythm: Normal rate and regular rhythm.  Pulmonary:     Effort: Pulmonary effort is normal. No respiratory distress.     Breath sounds: Normal breath sounds. No wheezing.  Abdominal:     General: Bowel sounds are normal. There is no distension.     Palpations: Abdomen is soft.     Tenderness: There is no abdominal tenderness.  Musculoskeletal:        General: Normal range of motion.     Cervical back: Normal range of motion and neck supple.  Skin:    General: Skin is warm and dry.  Neurological:     General: No focal deficit present.     Mental Status: She is alert.  Psychiatric:        Mood and Affect: Mood normal.      The results of significant diagnostics from this hospitalization (including imaging, microbiology, ancillary and laboratory) are listed  below for reference.   Microbiology: No results found for this or any previous visit (from the past 240 hours).   Labs: BNP (last 3 results) No results for input(s): "BNP" in the last 8760 hours. Basic Metabolic Panel: Recent Labs  Lab 03/17/24 0947 03/17/24 1932 03/18/24 0539 03/19/24 0556  NA 136  --  137 136  K 3.1*  --  3.6 3.5  CL 102  --  106 101  CO2 26  --  27 29  GLUCOSE 102*  --  81 90  BUN 16  --  14 14  CREATININE 0.92  --  0.91 0.85  CALCIUM 8.6*  --  8.4* 8.8*  MG  --  2.0  --  2.0  PHOS  --  3.6  --   --    Liver Function Tests: Recent Labs  Lab 03/18/24 0539  AST 12*  ALT 19  ALKPHOS 70  BILITOT 0.7  PROT 5.6*  ALBUMIN 3.0*   No results for input(s): "LIPASE", "AMYLASE" in the last 168 hours. No results for input(s): "AMMONIA" in the last 168 hours. CBC: Recent Labs  Lab 03/17/24 0947 03/18/24 0539  WBC 4.0 4.9  NEUTROABS 2.2  --   HGB 13.8 13.4  HCT 43.9 43.6  MCV 87.1 90.1  PLT 256 251   Cardiac Enzymes: No results for input(s): "CKTOTAL", "CKMB", "CKMBINDEX", "TROPONINI" in the last 168 hours. BNP: Invalid input(s): "POCBNP" CBG: Recent Labs  Lab 03/20/24 1220 03/20/24 1755 03/20/24 2319 03/21/24 0737 03/21/24 1207  GLUCAP 94 71 92 85 88   D-Dimer No results for input(s): "DDIMER" in the last 72 hours. Hgb A1c No results for input(s): "HGBA1C" in the last 72 hours. Lipid Profile No results for input(s): "CHOL", "HDL", "LDLCALC", "TRIG", "CHOLHDL", "LDLDIRECT" in the last 72 hours. Thyroid function studies No results for input(s): "TSH", "T4TOTAL", "T3FREE", "THYROIDAB" in the last 72 hours.  Invalid input(s): "FREET3" Anemia work up No results for input(s): "VITAMINB12", "FOLATE", "FERRITIN", "TIBC", "IRON", "RETICCTPCT" in the last 72 hours. Urinalysis    Component Value Date/Time   COLORURINE ORANGE (A) 09/02/2021 1644   APPEARANCEUR CLEAR 09/02/2021 1644   LABSPEC 1.020 09/02/2021 1644   PHURINE 5.5 09/02/2021  1644   GLUCOSEU 100 (A) 09/02/2021 1644   HGBUR NEGATIVE 09/02/2021 1644  BILIRUBINUR Negative 10/29/2023 1643   KETONESUR NEGATIVE 09/02/2021 1644   PROTEINUR Negative 10/29/2023 1643   PROTEINUR TRACE (A) 09/02/2021 1644   UROBILINOGEN 0.2 10/29/2023 1643   UROBILINOGEN 4.0 (H) 11/21/2010 2210   NITRITE Negative 10/29/2023 1643   NITRITE NEGATIVE 09/02/2021 1644   LEUKOCYTESUR Trace (A) 10/29/2023 1643   LEUKOCYTESUR TRACE (A) 09/02/2021 1644   Sepsis Labs Recent Labs  Lab 03/17/24 0947 03/18/24 0539  WBC 4.0 4.9   Microbiology No results found for this or any previous visit (from the past 240 hours).  Procedures/Studies: DG Naso G Tube Plc W/Fl W/Rad Addendum Date: 03/21/2024 ADDENDUM REPORT: 03/21/2024 08:27 ADDENDUM: I personally placed the nasogastric tube under fluoroscopic guidance in the Radiology department. Electronically Signed   By: Leanna Battles M.D.   On: 03/21/2024 08:27   Result Date: 03/21/2024 CLINICAL DATA:  Nasogastric tube placement. EXAM: NASO G TUBE PLACEMENT WITH FL AND WITH RAD FLUOROSCOPY: Fluoroscopy Time:  0 minutes 18 seconds Radiation Exposure Index (if provided by the fluoroscopic device): 3.4 mGy Number of Acquired Spot Images: 0 COMPARISON:  None Available. FINDINGS: Nasogastric tube was placed under fluoroscopic guidance. Tip was positioned in the distal stomach. IMPRESSION: Nasogastric tube placed under fluoroscopic guidance with tip positioned in the distal stomach. Electronically Signed: By: Leanna Battles M.D. On: 03/03/2024 16:06   DG Abd Portable 1V Result Date: 03/19/2024 CLINICAL DATA:  Small bowel obstruction EXAM: PORTABLE ABDOMEN - 1 VIEW COMPARISON:  03/18/2024 FINDINGS: NG tube remains in place with the tip in the distal stomach or proximal duodenum. Oral contrast material within the colon. Mildly prominent left abdominal small bowel loops noted. Prior cholecystectomy. No free air or organomegaly. IMPRESSION: Continued mildly dilated  left abdominal small bowel loops. Contrast material now noted within the colon. Findings could reflect continued partial small bowel obstruction pattern. Electronically Signed   By: Charlett Nose M.D.   On: 03/19/2024 11:15   DG Abd Portable 1V-Small Bowel Obstruction Protocol-initial, 8 hr delay Result Date: 03/18/2024 CLINICAL DATA:  Small bowel protocol, 8 hour delay EXAM: PORTABLE ABDOMEN - 1 VIEW COMPARISON:  03/17/2024, CT 03/17/2024 FINDINGS: Enteric tube tip and side port overlie the distal stomach. Decreased small bowel distension compared to prior. Enteral contrast is present within nondilated right lower quadrant small bowel, the colon and rectum. IMPRESSION: Decreased small bowel distension compared to prior. Enteral contrast is present within nondilated right lower quadrant small bowel, the colon and rectum. Electronically Signed   By: Jasmine Pang M.D.   On: 03/18/2024 15:50   DG Abdomen 1 View Result Date: 03/17/2024 CLINICAL DATA:  Enteric tube placement. EXAM: ABDOMEN - 1 VIEW COMPARISON:  CT abdomen pelvis from same day. FINDINGS: Enteric tube looped in the gastric fundus with the tip near the pylorus. Unchanged dilated small bowel loops. IMPRESSION: 1. Enteric tube looped in the gastric fundus with the tip near the pylorus. Electronically Signed   By: Obie Dredge M.D.   On: 03/17/2024 18:04   CT ABDOMEN PELVIS W CONTRAST Result Date: 03/17/2024 CLINICAL DATA:  Bowel obstruction suspected. Abdominal pain. Nausea. EXAM: CT ABDOMEN AND PELVIS WITH CONTRAST TECHNIQUE: Multidetector CT imaging of the abdomen and pelvis was performed using the standard protocol following bolus administration of intravenous contrast. RADIATION DOSE REDUCTION: This exam was performed according to the departmental dose-optimization program which includes automated exposure control, adjustment of the mA and/or kV according to patient size and/or use of iterative reconstruction technique. CONTRAST:   OMNIPAQUE IOHEXOL 300 MG/ML  SOLN COMPARISON:  CT scan abdomen and pelvis from 03/13/2024. FINDINGS: Lower chest: There are patchy atelectatic changes in the visualized lung bases. No overt consolidation. No pleural effusion. The heart is normal in size. No pericardial effusion. Hepatobiliary: The liver is normal in size. Non-cirrhotic configuration. No suspicious mass. No intrahepatic or extrahepatic bile duct dilation. Gallbladder is surgically absent. Pancreas: Unremarkable. No pancreatic ductal dilatation or surrounding inflammatory changes. Spleen: Within normal limits. No focal lesion. Adrenals/Urinary Tract: Adrenal glands are unremarkable. No suspicious renal mass. No hydronephrosis. No renal or ureteric calculi. Urinary bladder is under distended, precluding optimal assessment. However, no large mass or stones identified. No perivesical fat stranding. Stomach/Bowel: Patient is status post sleeve gastrectomy. Redemonstration of gaseous dilation of multiple proximal small bowel loops measuring up to 4.1 cm in diameter. The point of transition is in the mid abdomen (series 2, image 56). When compared to the prior exam, there is minimal interval decrease in the degree of small-bowel dilation. Findings are likely secondary to underlying adhesions. Distal small bowel loops and colon are nondilated. Unremarkable appendix. No abnormal bowel wall thickening or surrounding fat stranding. Vascular/Lymphatic: No ascites or pneumoperitoneum. No abdominal or pelvic lymphadenopathy, by size criteria. No aneurysmal dilation of the major abdominal arteries. Reproductive: Redemonstration of bulky uterus with multiple probable uterine leiomyomas. No large adnexal mass. Other: Midline surgical scar noted. The soft tissues and abdominal wall are otherwise unremarkable. Musculoskeletal: No suspicious osseous lesions. There are mild multilevel degenerative changes in the visualized spine. IMPRESSION: 1. Redemonstration of  small-bowel obstruction with minimal interval decrease in the degree of dilation of the proximal small bowel loops. No evidence of bowel ischemia or pneumoperitoneum. 2. Multiple other nonacute observations, as described above. Electronically Signed   By: Jules Schick M.D.   On: 03/17/2024 13:17   CT ABDOMEN PELVIS W CONTRAST Result Date: 03/13/2024 CLINICAL DATA:  Small-bowel obstruction. Previous sleeve gastrectomy of the lysis of adhesions. EXAM: CT ABDOMEN AND PELVIS WITH CONTRAST TECHNIQUE: Multidetector CT imaging of the abdomen and pelvis was performed using the standard protocol following bolus administration of intravenous contrast. RADIATION DOSE REDUCTION: This exam was performed according to the departmental dose-optimization program which includes automated exposure control, adjustment of the mA and/or kV according to patient size and/or use of iterative reconstruction technique. CONTRAST:  OMNIPAQUE IOHEXOL 300 MG/ML  SOLN COMPARISON:  02/28/2024 FINDINGS: Lower Chest: Increased bibasilar atelectasis Hepatobiliary: No suspicious hepatic masses identified. Prior cholecystectomy. No evidence of biliary obstruction. Pancreas:  No mass or inflammatory changes. Spleen: Within normal limits in size and appearance. Adrenals/Urinary Tract: No suspicious masses identified. No evidence of ureteral calculi or hydronephrosis. Stomach/Bowel: Surgical anastomosis again seen in the distal transverse colon. No No evidence of colonic dilatation. Normal appendix visualized. Prior sleeve gastrectomy again noted. Increased moderate dilatation of proximal small bowel with air-fluid levels is seen compared to prior study, consistent with mid small bowel obstruction. Transition point is seen in the anterior mid abdomen on images 44 through 46/series 2, highly suspicious for adhesion. No evidence pneumatosis, free intraperitoneal air, or abscess. Vascular/Lymphatic: No pathologically enlarged lymph nodes. No acute  vascular findings. Reproductive: Several small uterine fibroids again noted. Adnexal regions are unremarkable. Other:  None. Musculoskeletal:  No suspicious bone lesions identified. IMPRESSION: Worsening mid small bowel obstruction since prior exam. Transition point in the anterior mid abdomen, suspicious for adhesion. No evidence of pneumatosis, free air, or abscess. Prior sleeve gastrectomy in surgical staples in distal transverse colon. Small uterine fibroids. Electronically Signed  By: Danae Orleans M.D.   On: 03/13/2024 13:07   DG Abd 2 Views Result Date: 03/02/2024 CLINICAL DATA:  Small bowel obstruction. EXAM: ABDOMEN - 2 VIEW COMPARISON:  Radiograph yesterday, CT 02/28/2024 FINDINGS: Multiple small bowel air-fluid levels in the central abdomen with scattered gaseous small bowel distention up to 5.3 cm. No definite formed stool in the colon. Calcifications in the pelvis consistent with phleboliths. IMPRESSION: Multiple small bowel air-fluid levels with scattered gaseous small bowel distention, suspicious for small bowel obstruction. Electronically Signed   By: Narda Rutherford M.D.   On: 03/02/2024 16:57   DG Abd 1 View Result Date: 03/01/2024 CLINICAL DATA:  Small-bowel obstruction EXAM: ABDOMEN - 1 VIEW COMPARISON:  X-ray 02/29/2024.  CT 02/28/2024 FINDINGS: Multiple dilated air-filled loops of bowel throughout the mid abdomen again seen consistent with known SBO. There is some air in nondilated loops of colon with some mild stool including towards the rectum. Surgical clips in the right upper quadrant. No obvious free air on these supine radiographs. The diaphragm is clipped off the edge of the film. In addition the films are under penetrated and there is motion limiting evaluation. IMPRESSION: Limited x-rays. Persistent air-filled dilated loops of small bowel in the mid abdomen. Recommend continued follow-up. Electronically Signed   By: Karen Kays M.D.   On: 03/01/2024 12:42   DG Abd Portable  1V Result Date: 02/29/2024 CLINICAL DATA:  Nasogastric tube placement. EXAM: PORTABLE ABDOMEN - 1 VIEW COMPARISON:  Earlier today FINDINGS: Tip and side port of the enteric tube below the diaphragm in the stomach. Gaseous small bowel distention in the upper abdomen is partially included in the field of view. IMPRESSION: Tip and side port of the enteric tube below the diaphragm in the stomach. Electronically Signed   By: Narda Rutherford M.D.   On: 02/29/2024 12:43   DG Abd Portable 1V-Small Bowel Obstruction Protocol-initial, 8 hr delay Result Date: 02/29/2024 CLINICAL DATA:  Abdominal pain and vomiting. Mild partial small bowel obstruction seen on an abdomen and pelvis CT yesterday. 8 hour delayed image after drinking oral contrast. EXAM: PORTABLE ABDOMEN - 1 VIEW COMPARISON:  Abdomen and pelvis CT dated 02/28/2024. FINDINGS: Mildly dilated loops of jejunum with improvement. Gas and stool in normal caliber colon. Left upper quadrant abdominal surgical clips. Cholecystectomy clips. No oral contrast seen in the bowel. The stomach is not included on the image. Unremarkable bones. IMPRESSION: 1. Mild partial small bowel obstruction with improvement. 2. No oral contrast seen in the bowel.  The stomach is not included. Electronically Signed   By: Beckie Salts M.D.   On: 02/29/2024 11:55   CT ABDOMEN PELVIS W CONTRAST Result Date: 02/28/2024 CLINICAL DATA:  Epigastric pain and vomiting, initial encounter EXAM: CT ABDOMEN AND PELVIS WITH CONTRAST TECHNIQUE: Multidetector CT imaging of the abdomen and pelvis was performed using the standard protocol following bolus administration of intravenous contrast. RADIATION DOSE REDUCTION: This exam was performed according to the departmental dose-optimization program which includes automated exposure control, adjustment of the mA and/or kV according to patient size and/or use of iterative reconstruction technique. CONTRAST:  OMNIPAQUE IOHEXOL 300 MG/ML  SOLN COMPARISON:   07/16/2023 FINDINGS: Lower chest: No acute abnormality. Hepatobiliary: No focal liver abnormality is seen. Status post cholecystectomy. No biliary dilatation. Pancreas: Unremarkable. No pancreatic ductal dilatation or surrounding inflammatory changes. Spleen: Normal in size without focal abnormality. Adrenals/Urinary Tract: Adrenal glands are within normal limits. Kidneys demonstrate a normal enhancement pattern bilaterally. No renal calculi are seen.  The ureters are within normal limits. Stomach/Bowel: No obstructive or inflammatory changes of the colon are seen. The appendix is within normal limits. 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 Mildly dilated loops of jejunum are identified in the left mid abdomen. Some mildly inflamed loops of small bowel are noted within the obstructive change although a transition zone is noted on image number 111 of series 601 in the left mid abdomen. The more distal small bowel is within normal limits. Vascular/Lymphatic: No significant vascular findings are present. No enlarged abdominal or pelvic lymph nodes. Reproductive: Uterus is somewhat bulky consistent with uterine fibroid change. Small simple cysts are noted bilaterally. No follow-up is recommended. Other: No abdominal wall hernia or abnormality. No abdominopelvic ascites. Musculoskeletal: No acute or significant osseous findings. IMPRESSION: Mild partial small bowel obstruction in the jejunum secondary to a transition zone in the left mid abdomen as described. This is likely related to adhesions as no focal mass is seen. Uterine fibroid change. Electronically Signed   By: Alcide Clever M.D.   On: 02/28/2024 02:07     Time coordinating discharge: Over 30 minutes    Lewie Chamber, MD  [facility] 03/21/2024, 1:21 PM

## 2024-03-24 ENCOUNTER — Telehealth: Payer: Self-pay

## 2024-03-24 NOTE — Transitions of Care (Post Inpatient/ED Visit) (Unsigned)
   03/24/2024  Name: Shelly Larson MRN: [id number] DOB: 11/27/1971  Today's TOC FU Call Status: Today's TOC FU Call Status:: Unsuccessful Call (1st Attempt) Unsuccessful Call (1st Attempt) Date: 03/24/24  Attempted to reach the patient regarding the most recent Inpatient/ED visit.  Follow Up Plan: Additional outreach attempts will be made to reach the patient to complete the Transitions of Care (Post Inpatient/ED visit) call.   Signature Karena Addison, LPN [facility] Nurse Health Advisor Direct Dial [phone]

## 2024-03-25 NOTE — Transitions of Care (Post Inpatient/ED Visit) (Signed)
 03/25/2024  Name: Shelly Larson MRN: [id number] DOB: May 11, 1971  Today's TOC FU Call Status: Today's TOC FU Call Status:: Successful TOC FU Call Completed Unsuccessful Call (1st Attempt) Date: 03/24/24 [facility] FU Call Complete Date: 03/25/24 Patient's Name and Date of Birth confirmed.  Transition Care Management Follow-up Telephone Call Date of Discharge: 03/21/24 Discharge Facility: [facility]) Type of Discharge: Inpatient Admission How have you been since you were released from the hospital?: Better Any questions or concerns?: No  Items Reviewed: Did you receive and understand the discharge instructions provided?: Yes Medications obtained,verified, and reconciled?: Yes (Medications Reviewed) Any new allergies since your discharge?: No Dietary orders reviewed?: Yes Do you have support at home?: Yes People in Home: other relative(s), spouse  Medications Reviewed Today: Medications Reviewed Today     Reviewed by Karena Addison, LPN (Licensed Practical Nurse) on 03/25/24 at 1424  Med List Status: <None>   Medication Order Taking? Sig Documenting Provider Last Dose Status Informant  albuterol (VENTOLIN HFA) 108 (90 Base) MCG/ACT inhaler [id number] No Inhale 2 puffs into the lungs every 4 (four) hours as needed for wheezing or shortness of breath.  [provider] Taking Active Self, Pharmacy Records  amitriptyline (ELAVIL) 25 MG tablet [id number] No Take 3 tablets (75 mg total) by mouth at bedtime. Antony Madura, MD 03/16/2024 Active Self, Pharmacy Records  amLODipine (NORVASC) 10 MG tablet [id number] No TAKE 1 TABLET BY MOUTH EVERY DAY Allwardt, Crist Infante, PA-C 03/16/2024 Active Self, Pharmacy Records  amphetamine-dextroamphetamine (ADDERALL XR) 30 MG 24 hr capsule [id number] No Take 1 capsule (30 mg total) by mouth every morning. Allwardt, Crist Infante, PA-C 03/16/2024 Active Self, Pharmacy Records  amphetamine-dextroamphetamine (ADDERALL) 15 MG tablet [id number] No Take 1 tab po  around 2 pm  Patient taking differently: Take 15 mg by mouth daily as needed (in the afternoon as needed for focus/attention.).   Allwardt, Crist Infante, PA-C Taking Active Self, Pharmacy Records  clonazePAM (KLONOPIN) 0.5 MG tablet [id number] No Take 1 tablet (0.5 mg total) by mouth 2 (two) times daily. Allwardt, Crist Infante, PA-C 03/16/2024 Active Self, Pharmacy Records  Erenumab-aooe (AIMOVIG) 140 MG/ML SOAJ [id number] No Inject 140 mg into the skin every 28 (twenty-eight) days. Antony Madura, MD Past Month Active Self, Pharmacy Records  hydrocortisone (ANUSOL-HC) 2.5 % rectal cream [id number] No Place 1 Application rectally 2 (two) times daily.  Patient taking differently: Place 1 Application rectally 2 (two) times daily as needed for hemorrhoids or anal itching.   Geoffery Lyons, MD Taking Active Self, Pharmacy Records  LINZESS 290 MCG CAPS capsule [id number] No TAKE 1 CAPSULE BY MOUTH DAILY BEFORE BREAKFAST. Aida Raider, NP 03/16/2024 Active Self, Pharmacy Records  losartan-hydrochlorothiazide [facility]) 100-25 MG tablet [id number] No TAKE 1 TABLET BY MOUTH EVERY DAY Allwardt, Crist Infante, PA-C 03/16/2024 Active Self, Pharmacy Records  metoprolol succinate (TOPROL-XL) 50 MG 24 hr tablet [id number] No Take 50 mg by mouth every morning.  [provider] 03/16/2024 Active Self, Pharmacy Records  omeprazole (PRILOSEC) 40 MG capsule [id number] No Take 1 capsule (40 mg total) by mouth daily.  Patient taking differently: Take 40 mg by mouth every evening.   Aida Raider, NP 03/16/2024 Active Self, Pharmacy Records  ondansetron (ZOFRAN-ODT) 4 MG disintegrating tablet [id number]  Take 1 tablet (4 mg total) by mouth every 6 (six) hours as needed for up to 7 days for nausea or vomiting. Lewie Chamber, MD  Active   oxyCODONE (OXY IR/ROXICODONE) 5 MG immediate release tablet [id number] No  Take 1 tablet (5 mg total) by mouth every 6 (six) hours as needed for breakthrough pain or severe pain (pain score 7-10).   Patient not taking: Reported on 11/07/2023   Gaynelle Adu, MD Not Taking Active Self, Pharmacy Records  oxyCODONE (OXY IR/ROXICODONE) 5 MG immediate release tablet [id number]  Take 1 tablet (5 mg total) by mouth every 4 (four) hours as needed for moderate pain (pain score 4-6) or severe pain (pain score 7-10). Lewie Chamber, MD  Active   pantoprazole (PROTONIX) 40 MG tablet [id number] No Take 40 mg by mouth daily. [provider] 03/16/2024 Active Self, Pharmacy Records  promethazine (PHENERGAN) 12.5 MG tablet [id number] No Take 1 tablet (12.5 mg total) by mouth every 6 (six) hours as needed for nausea or vomiting. Antony Madura, MD Taking Active Self, Pharmacy Records  Rimegepant Sulfate (NURTEC) 75 MG TBDP [id number] No Take 1 tablet (75 mg total) by mouth daily as needed. Antony Madura, MD Taking Active Self, Pharmacy Records  Safety Seal Miscellaneous MISC [id number]  Apply 1 Application topically at bedtime. Medication Name: Melaxemic Cream (Tranexamic Acid, Kojic Acid, Vit C, Hydrocortisone,Hyaluronic Acid) Terri Piedra, DO  Active Self, Pharmacy Records  Zavegepant HCl (ZAVZPRET) 10 MG/ACT Criss Rosales [id number] No Place 10 mg into the nose as needed (for migraine). Antony Madura, MD Taking Active Self, Pharmacy Records            Home Care and Equipment/Supplies: Were Home Health Services Ordered?: NA Any new equipment or medical supplies ordered?: NA  Functional Questionnaire: Do you need assistance with bathing/showering or dressing?: No Do you need assistance with meal preparation?: No Do you need assistance with eating?: No Do you have difficulty maintaining continence: No Do you need assistance with getting out of bed/getting out of a chair/moving?: No Do you have difficulty managing or taking your medications?: No  Follow up appointments reviewed: PCP Follow-up appointment confirmed?: Yes Date of PCP follow-up appointment?: 03/28/24 Follow-up Provider:  [facility] Follow-up appointment confirmed?: Yes Date of Specialist follow-up appointment?: 04/01/24 Follow-Up Specialty Provider:: bariatric Do you need transportation to your follow-up appointment?: No Do you understand care options if your condition(s) worsen?: Yes-patient verbalized understanding    SIGNATURE Karena Addison, LPN [facility] Nurse Health Advisor Direct Dial [phone]

## 2024-03-28 ENCOUNTER — Inpatient Hospital Stay: Admitting: Physician Assistant

## 2024-03-28 ENCOUNTER — Other Ambulatory Visit: Payer: Self-pay | Admitting: Physician Assistant

## 2024-03-28 DIAGNOSIS — F410 Panic disorder [episodic paroxysmal anxiety] without agoraphobia: Secondary | ICD-10-CM

## 2024-03-28 NOTE — Telephone Encounter (Signed)
 LOV 08/20/2023 NOV:03/31/2024 QUAT:30TAB LAST FILLED:12/27/2023

## 2024-03-31 ENCOUNTER — Inpatient Hospital Stay: Admitting: Physician Assistant

## 2024-04-08 NOTE — Telephone Encounter (Signed)
 Pharmacy Patient Advocate Encounter  Received notification from [employer] that Prior Authorization for NURTEC 75MG  has been APPROVED from 11.22.24 to 11.22.25   PA #/Case ID/Reference #: [id number]

## 2024-04-22 ENCOUNTER — Other Ambulatory Visit: Payer: Self-pay

## 2024-04-22 ENCOUNTER — Ambulatory Visit
Admission: EM | Admit: 2024-04-22 | Discharge: 2024-04-22 | Disposition: A | Discharge status: Home / Self Care | Attending: Family Medicine | Admitting: Family Medicine

## 2024-04-22 DIAGNOSIS — S91332A Puncture wound without foreign body, left foot, initial encounter: Secondary | ICD-10-CM

## 2024-04-22 MED ORDER — CEPHALEXIN 500 MG PO CAPS
500.0000 mg | ORAL_CAPSULE | Freq: Three times a day (TID) | ORAL | 0 refills | Status: AC
Start: 2024-04-22 — End: 2024-04-27

## 2024-04-22 NOTE — ED Triage Notes (Signed)
 Pt states she pulled a long piece of splinter out of her left foot last night, but woke up this morning and her foot was sore and swollen and still has a piece of splinter in there. Pt has a splinter in her foot just under the surface of the skin

## 2024-04-22 NOTE — ED Provider Notes (Signed)
 [facility]    CSN: [id number] Arrival date & time: 04/22/24  1052      History   Chief Complaint No chief complaint on file.   HPI Shelly Larson is a 53 y.o. female presents for splinter in foot.  Patient reports last night she was on her deck and got a splinter in her left foot.  States she removed the majority of it but this morning she had some tenderness to the area and thinks there may be some residual splinter left.  No swelling drainage fevers.  No other concerns at this time.  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   Anal or rectal pain 07/04/2023   Panic disorder (episodic paroxysmal anxiety) 01/09/2023   Menopausal and perimenopausal disorder 01/09/2023   Bilateral impacted cerumen 10/09/2022   Adult ADHD 09/11/2022   Migraine without aura and with status migrainosus, not intractable 08/17/2022   Difficulty sleeping 08/17/2022   Anxiety 08/17/2022   Absolute anemia 08/17/2022   LFTs abnormal    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    AKI (acute kidney injury) (HCC) 09/04/2021   Hepatocellular injury    Acute hepatitis B virus infection    Acute hepatitis 09/01/2021   LFT elevation    Abnormal liver enzymes 08/31/2021   Jaundice 08/31/2021   Abdominal pain 08/31/2021   Nausea 08/31/2021   Asthma 08/31/2021   Type 2 diabetes mellitus (HCC)  08/31/2021   Abnormal uterine bleeding (AUB) 08/06/2019   History of endometrial ablation 08/06/2019   Symptomatic mammary hypertrophy 10/01/2018   Menorrhagia with regular cycle 09/10/2018   Prediabetes 07/09/2018   Morbid obesity (HCC) 07/09/2018   Essential hypertension 07/09/2018   Mixed hyperlipidemia 07/09/2018   H/O colon cancer, stage II 05/07/2018   Change in bowel function 10/16/2017   Prolapsed internal hemorrhoids 10/16/2017   Rectal bleeding 10/16/2017   Abnormal result of other cardiovascular function study 08/21/2017   Dyspnea, unspecified 08/08/2017   Sleep disturbance 08/08/2017   Encounter for general adult medical examination without abnormal findings 08/06/2017   Bright red rectal bleeding 02/23/2016   Iron deficiency 02/23/2016   Iron deficiency anemia due to chronic blood loss 10/08/2015   Status post colectomy 10/08/2015   Body mass index 34.0-34.9, adult 09/06/2015   Closed fracture of navicular (scaphoid) bone of wrist 09/06/2015   Colon cancer (HCC) 12/25/2014    Past Surgical History:  Procedure Laterality Date   ABLATION     BAND HEMORRHOIDECTOMY     BIOPSY  09/02/2021   Procedure: BIOPSY;  Surgeon: Vinetta Greening, DO;  [facility];  Service: Endoscopy;;   BREAST REDUCTION SURGERY Bilateral    CHOLECYSTECTOMY  2010   [place] , biliary pancreatitis   COLECTOMY  09/30/2015  distal transverse d/t adenocarcinoma   COLONOSCOPY N/A 06/11/2018   Procedure: COLONOSCOPY;  Surgeon: Suzette Espy, MD;  [facility];  Service: Endoscopy;  Laterality: N/A;  1:45pm   COLONOSCOPY WITH PROPOFOL  N/A 04/02/2023   Procedure: COLONOSCOPY WITH PROPOFOL ;  Surgeon: Suzette Espy, MD;  [facility];  Service: Endoscopy;  Laterality: N/A;  12:15 pm   DILITATION & CURRETTAGE/HYSTROSCOPY WITH NOVASURE ABLATION N/A 09/10/2018   Procedure: DILATATION & CURETTAGE/HYSTEROSCOPY WITH MINERVA  ENDOMETRIAL ABLATION;  Surgeon: Albino Hum, MD;  Location: [facility];  Service: Gynecology;  Laterality: N/A;   ESOPHAGOGASTRODUODENOSCOPY (EGD) WITH PROPOFOL  N/A 09/02/2021   Procedure: ESOPHAGOGASTRODUODENOSCOPY (EGD) WITH PROPOFOL ;  Surgeon: Vinetta Greening, DO;  [facility];  Service: Endoscopy;  Laterality: N/A;   GASTRIC ROUX-EN-Y N/A 10/15/2023   Procedure: LAPAROSCOPIC GASTRIC SLEEVE AND LYSIS OF ADHESIONS WITH UPPER ENDOSCOPY;  Surgeon: Aldean Hummingbird, MD;  Location: [facility];  Service: General;  Laterality: N/A;   HEMORRHOID SURGERY N/A 07/04/2023   Procedure: EXTENSIVE HEMORRHOIDECTOMY;  Surgeon: Awilda Bogus, MD;  Location: [facility];  Service: General;  Laterality: N/A;   POLYPECTOMY  06/11/2018   Procedure: POLYPECTOMY;  Surgeon: Suzette Espy, MD;  [facility];  Service: Endoscopy;;  colon   POLYPECTOMY  04/02/2023   Procedure: POLYPECTOMY;  Surgeon: Suzette Espy, MD;  [facility];  Service: Endoscopy;;    OB History     Gravida  0   Para  0   Term  0   Preterm  0   AB  0   Living  0      SAB  0   IAB  0   Ectopic  0   Multiple  0   Live Births  0            Home Medications    Prior to Admission medications   Medication Sig Start Date End Date Taking? Authorizing Provider  cephALEXin (KEFLEX) 500 MG capsule Take 1 capsule (500 mg total) by mouth 3 (three) times daily for 5 days. 04/22/24 04/27/24 Yes Calleen Alvis, Jodi R, NP  albuterol  (VENTOLIN  HFA) 108 (90 Base) MCG/ACT inhaler Inhale 2 puffs into the lungs every 4 (four) hours as needed for wheezing or shortness of breath.  10/17/17   [provider]  amitriptyline  (ELAVIL ) 25 MG tablet Take 3 tablets (75 mg total) by mouth at bedtime. 05/03/23 05/02/24  Ellene Gustin, MD  amLODipine  (NORVASC ) 10 MG tablet TAKE 1 TABLET BY MOUTH EVERY DAY 06/04/23   Allwardt, Alyssa M, PA-C  amphetamine -dextroamphetamine  (ADDERALL XR) 30 MG 24 hr capsule Take 1 capsule (30 mg total) by mouth every morning. 07/08/23    Allwardt, Alyssa M, PA-C  amphetamine -dextroamphetamine  (ADDERALL) 15 MG tablet Take 1 tab po around 2 pm Patient taking differently: Take 15 mg by mouth daily as needed (in the afternoon as needed for focus/attention.). 10/09/22   Allwardt, Alyssa M, PA-C  clonazePAM  (KLONOPIN ) 0.5 MG tablet Take 1 tablet (0.5 mg total) by mouth 2 (two) times daily. 12/14/23   Allwardt, Alyssa M, PA-C  Erenumab -aooe (AIMOVIG ) 140 MG/ML SOAJ Inject 140 mg into the skin every 28 (twenty-eight) days. 11/07/23   Ellene Gustin, MD  hydrocortisone  (ANUSOL -HC) 2.5 % rectal cream Place 1 Application rectally 2 (two) times daily. Patient taking differently: Place 1 Application rectally 2 (two) times daily as needed for hemorrhoids or anal itching. 07/04/23   Orvilla Blander, MD  LINZESS   290 MCG CAPS capsule TAKE 1 CAPSULE BY MOUTH DAILY BEFORE BREAKFAST. 03/17/24   Mahon, Martine Sleek, NP  losartan -hydrochlorothiazide  (HYZAAR) 100-25 MG tablet TAKE 1 TABLET BY MOUTH EVERY DAY 10/08/23   Allwardt, Alyssa M, PA-C  metoprolol  succinate (TOPROL -XL) 50 MG 24 hr tablet Take 50 mg by mouth every morning.  09/19/17   [provider]  omeprazole  (PRILOSEC) 40 MG capsule Take 1 capsule (40 mg total) by mouth daily. Patient taking differently: Take 40 mg by mouth every evening. 03/19/23   Eustacio Highman, NP  oxyCODONE  (OXY IR/ROXICODONE ) 5 MG immediate release tablet Take 1 tablet (5 mg total) by mouth every 6 (six) hours as needed for breakthrough pain or severe pain (pain score 7-10). Patient not taking: Reported on 11/07/2023 10/16/23   Aldean Hummingbird, MD  oxyCODONE  (OXY IR/ROXICODONE ) 5 MG immediate release tablet Take 1 tablet (5 mg total) by mouth every 4 (four) hours as needed for moderate pain (pain score 4-6) or severe pain (pain score 7-10). 03/21/24   Faith Homes, MD  promethazine  (PHENERGAN ) 12.5 MG tablet Take 1 tablet (12.5 mg total) by mouth every 6 (six) hours as needed for nausea or vomiting. 11/07/23   Ellene Gustin, MD  Rimegepant Sulfate  (NURTEC) 75 MG TBDP Take 1 tablet (75 mg total) by mouth daily as needed. 11/01/23   Ellene Gustin, MD  Safety Seal Miscellaneous MISC Apply 1 Application topically at bedtime. Medication Name: Melaxemic Cream (Tranexamic Acid, Kojic Acid, Vit C, Hydrocortisone ,Hyaluronic Acid) 11/19/23   Dellar Fenton, DO  Zavegepant HCl (ZAVZPRET ) 10 MG/ACT SOLN Place 10 mg into the nose as needed (for migraine). 11/07/23   Ellene Gustin, MD    Family History Family History  Problem Relation Age of Onset   Hypertension Mother    Diabetes Mellitus II Mother    Heart disease Sister        dx in late 53's   Breast cancer Paternal Grandmother    Breast cancer Paternal Aunt    Colon cancer Neg Hx    Liver disease Neg Hx     Social History Social History   Tobacco Use   Smoking status: Never    Passive exposure: Never   Smokeless tobacco: Never  Vaping Use   Vaping status: Never Used  Substance Use Topics   Alcohol use: Yes    Comment: rare   Drug use: No     Allergies   Lisinopril   Review of Systems Review of Systems  Skin:  Positive for wound.     Physical Exam Triage Vital Signs ED Triage Vitals  Encounter Vitals Group     BP 04/22/24 1112 122/77     Systolic BP Percentile --      Diastolic BP Percentile --      Pulse Rate 04/22/24 1112 67     Resp 04/22/24 1112 17     Temp 04/22/24 1112 98.6 F (37 C)     Temp Source 04/22/24 1112 Oral     SpO2 04/22/24 1112 97 %     Weight --      Height --      Head Circumference --      Peak Flow --      Pain Score 04/22/24 1110 2     Pain Loc --      Pain Education --      Exclude from Growth Chart --    No data found.  Updated Vital Signs BP 122/77  Pulse 67   Temp 98.6 F (37 C) (Oral)   Resp 17   SpO2 97%   Visual Acuity Right Eye Distance:   Left Eye Distance:   Bilateral Distance:    Right Eye Near:   Left Eye Near:    Bilateral Near:     Physical Exam Vitals and  nursing note reviewed.  Constitutional:      Appearance: Normal appearance.  HENT:     Head: Normocephalic and atraumatic.  Eyes:     Pupils: Pupils are equal, round, and reactive to light.  Cardiovascular:     Rate and Rhythm: Normal rate.  Pulmonary:     Effort: Pulmonary effort is normal.  Musculoskeletal:       Feet:  Feet:     Comments: There is a small puncture wound to the plantar aspect of the distal left foot.  No palpable foreign body slightly darkened area which could indicate a possible foreign body/splinter. no drainage or swelling.  Area is tender to palpation. Skin:    General: Skin is warm and dry.  Neurological:     General: No focal deficit present.     Mental Status: She is alert and oriented to person, place, and time.  Psychiatric:        Mood and Affect: Mood normal.        Behavior: Behavior normal.      UC Treatments / Results  Labs (all labs ordered are listed, but only abnormal results are displayed) Labs Reviewed - No data to display  EKG   Radiology No results found.  Procedures Foreign Body Removal  Date/Time: 04/22/2024 11:48 AM  Performed by: Alleen Arbour, NP Authorized by: Alleen Arbour, NP   Consent:    Consent obtained:  Verbal   Consent given by:  Patient   Risks, benefits, and alternatives were discussed: yes     Risks discussed:  Bleeding, infection, incomplete removal, pain, worsening of condition, nerve damage and poor cosmetic result   Alternatives discussed:  Referral and observation Universal protocol:    Patient identity confirmed:  Verbally with patient Location:    Location:  Foot   Foot location:  L sole   Depth:  Subcutaneous   Tendon involvement:  None Pre-procedure details:    Neurovascular status: intact   Anesthesia:    Anesthesia method:  Topical application   Topical anesthesia: Pain ease spray. Procedure type:    Procedure complexity:  Simple Procedure details:    Localization method:  Probed    Dissection of underlying tissues: yes (Scalpel used to remove small layer of skin at puncture site to probe for foreign body)     Bloodless field: yes     Foreign bodies recovered:  None Post-procedure details:    Neurovascular status: intact     Confirmation:  No additional foreign bodies on visualization   Skin closure:  None   Dressing:  Antibiotic ointment and non-adherent dressing Comments:     No foreign body/splinter after probing  (including critical care time)  Medications Ordered in UC Medications - No data to display  Initial Impression / Assessment and Plan / UC Course  I have reviewed the triage vital signs and the nursing notes.  Pertinent labs & imaging results that were available during my care of the patient were reviewed by me and considered in my medical decision making (see chart for details).     Reviewed exam and concerns with patient.  No splinter found after  inspection/probing of area. Discussed area sore likely from initial splinter puncture wound.  Will start Keflex 3 times daily for 5 days to prevent infection.  She can do Epsom salt soaks as needed.  PCP follow-up if symptoms do not improve.  ER precautions reviewed and patient verbalized understanding. Final Clinical Impressions(s) / UC Diagnoses   Final diagnoses:  Puncture wound of left foot, initial encounter     Discharge Instructions      Keep area clean and dry.  You may soak the wound with Epsom salt as needed.  Start Keflex 3 times a day for 5 days to help prevent infection.  Follow-up with your PCP if your symptoms do not improve.  Please go to the ER for any worsening symptoms.  Hope you feel better soon!     ED Prescriptions     Medication Sig Dispense Auth. Provider   cephALEXin (KEFLEX) 500 MG capsule Take 1 capsule (500 mg total) by mouth 3 (three) times daily for 5 days. 15 capsule Kary Sugrue, Jodi R, NP      PDMP not reviewed this encounter.   Alleen Arbour, NP 04/22/24 1153

## 2024-04-22 NOTE — Discharge Instructions (Addendum)
 Keep area clean and dry.  You may soak the wound with Epsom salt as needed.  Start Keflex 3 times a day for 5 days to help prevent infection.  Follow-up with your PCP if your symptoms do not improve.  Please go to the ER for any worsening symptoms.  Hope you feel better soon!

## 2024-06-05 ENCOUNTER — Other Ambulatory Visit: Payer: Self-pay | Admitting: Neurology

## 2024-06-05 DIAGNOSIS — G43E09 Chronic migraine with aura, not intractable, without status migrainosus: Secondary | ICD-10-CM

## 2024-06-26 NOTE — Progress Notes (Deleted)
 NEUROLOGY FOLLOW UP OFFICE NOTE  Shelly Larson [id number]  Subjective:  Shelly Larson is a 53 y.o. year old right-handed female with a medical history of migraine, HTN, DM, hep B, ADHD, colon cancer, anxiety, OA, plantar fasciitis, gastric sleeve who we last saw on 11/07/23 for migraine without aura.  To briefly review: Patient has had headaches for a lot of her life. They went away when she was in her 64s. About 4-5 years ago, she began having headaches again. She was on metoprolol , other BP meds, and topirimate for prevention. She was on sumatriptan for abortive therapy. This was working until recently. She was getting 3-4 headaches per month at that time. The sumatriptan would make her headaches tolerable. Patient stopped taking topirimate about 1 year ago.    Over the last 2-3 months, she has had worsening headaches. She is having more frequent headaches. PCP switched patient rizatriptan  10 mg PRN. This does not help much. She has not identified a clear trigger.    The headaches patient has now are similar to headaches she had when she was younger. Most of the time the pain is behind both eyes. She describes a banging, piercing sensation. She endorses photophobia, phonophobia, nausea, and vomiting. Movement and activity makes it worse. She also has aura (flashing lights, vision changes, floaters, dizziness like sensation). The headaches last about 1-1.5 days before dying down to a tolerable nagging pain. At the worst, the pain is 9/10. She has about 6-8 headaches per month. She always gets at least 1 every week, maybe 2-3. There is no positional component per patient. Her headaches slowly build to maximal intensity.   Patient has been having night sweats for at least 6-7 months and hot flashes for about a year. Patient had an ablation 2-3 years ago. She occasionally has occasional spotting that she calls her fake cycle. She gets a migraine when this happens. She is not sleeping well, maybe  sleeping 3-4 hours a night because of hot flashes, sweats, or headaches. She is not hormonal therapy.   She takes OTC about 3 doses per week and triptan 1-2 times per week (total of 5 doses per week, or 20 total doses per month).   Patient occasionally goes to the ED for acute treatment, about 6 times in the last year, due to headache that she cannot control. She last went to the ED around 06/2022.   Past NSAIDS/analgesics:  tylenol , excedrin, ibuprofen Past abortive triptans:  sumatriptan (no longer helping) Past muscle relaxants:  flexeril Past anti-emetic:  phenergan  Past antihypertensive medications:  propranolol, metoprolol  Past antidepressant medications:  elavil  (helped a lot) Past anticonvulsant medications:  topirimate (thinks it may have helped) Past vitamins/Herbal/Supplements:  none Past antihistamines/decongestants:  bendryl   Current NSAIDS/analgesics:  tylenol , excedrin, ibuprofen Current triptans:  rizatriptan  (not helping) Current ergotamine:  none Current anti-emetic:  none Current muscle relaxants:  none Current Antihypertensive medications:  metoprolol  Current Antidepressant medications:  none Current Anticonvulsant medications:  none Current anti-CGRP:  none Current Vitamins/Herbal/Supplements:  none Current Antihistamines/Decongestants:  bendryl Hormone/birth control:  none   Caffeine:  occasional Alcohol:  occasional wine Smoker:  no Diet:  normal american diet Exercise:  walks 3-4 times per week (about 1 mile); gets a lot of steps in daily Depression:  none; Anxiety:  gets panic attacks and anxiety (on adderall) Other pain:  gets a pain from the neck; getting worse of late Family history of headache:  sister had migraines   01/18/23: Nurtec was  approved on 10/31/22. Patient was unaware and did not ever get this medication.   She was diagnosed with COVID on 11/01/22. Her headaches have been worse since COVID. She had very severe headaches during COVID. She  describes the headaches similar to prior, pain behind eyes, banging, piercing sensation with photophobia, phonophobia, and nausea and vomiting. The headaches are more likely with weather changes, cold weather, or pressure changes. She is having about 5 headaches per week currently (~20 days of headache per month compared to ~16 days of headache on 10/27/22).   Patient started Elavil  25 mg qhs. She also had rizatriptan  but it did not work. She takes phenergan  for nausea.   Vit D was low at 8.47, so supplementation was recommended. She started this as recommended.   05/03/23: Patient started Nurtec. It has been very helpful. Patient has had about 4 headaches per month, about 7-8 over the last couple of months.   On Monday (04/30/23) patient started having a headache. She initially took tylenol . She then took Nurtec that got rid of the headache for a while. The headache then came back. She thought the headache was going away, but it didn't and started coming back. She took Nurtec again, but still has a headache. This headache is similar to known headaches. This is the first headache that has been debilitating of late.   Patient endorses some increased stress due to husband's health. There has been no other recent changes to health or medications.   11/07/23: She was having 2-3 headaches prior to recent surgery. Patient had gastric sleeve surgery in 09/2023. Since the surgery, she has had an increase in frequency and intensity in headaches. She is now having 2-3 headaches per week. The headaches are the same as previous (no change in character). She had one so bad that she went almost went to the ED last week.   She takes Elavil  75 mg at bedtime. Nurtec is also not working as well since surgery. It was working within an hour previously, now taking multiple hours to work. At last visit, I prescribed a medrol  dose pack. She is not sure if it worked, but her headaches did get better with some time.   Her neck  pain is improved from prior.   Most recent Assessment and Plan (11/07/23): This is [name], a 53 y.o. female with: Chronic migraines without aura. She has previously failed propranolol, metoprolol , and topiramate for migraine prevention. She has failed tylenol , Excedrin, ibuprofen, sumatriptan, and rizatriptan  for migraine rescue. Patient is now not responding well to Nurtec since gastric sleeve surgery. She is also not responding as well to Elavil  for prevention. Her gastric sleeve surgery may have put increased stress on her body increasing her migraine frequency and intensity, but now absorption of medications is a question due to the surgery. For this reason, I will order an injectable for prevention and nasal spray for rescue to avoid any issues with GI absorption. Vit D deficiency      Plan: Medrol  dose pak to try to break current headache cycle Migraine prevention:  Continue Elavil  to 75 mg at bedtime; Will order Aimovig  140 mg every 28 days and attempt to get approved by insurance. I will likely slowly taper off Elavil  when patient is able to start Aimovig . Migraine rescue:  Continue Nurtec 75 mg PRN; Will order Zavzpret  10 mg nasal spray PRN and attempt to get approved. She will continue Nurtec until this is approved then stop Nurtec. Limit use of  pain relievers to no more than 2 days out of week to prevent risk of rebound or medication-overuse headache. Keep headache diary Continue vit D 1000 IU daily   -Blood work: B1, B12, TSH  Since their last visit: Lab work was normal.   Zavzpret  was approved on 11/27/23 to 11/26/24.*** Aimovig  was approved on 12/07/23 to 12/06/24.***   Headaches?   Current medications: ***   Of note, patient was admitted on 02/28/24 for SBO.***Cancelled her 03/07/24 appointment.  MEDICATIONS:  Outpatient Encounter Medications as of 07/10/2024  Medication Sig   albuterol  (VENTOLIN  HFA) 108 (90 Base) MCG/ACT inhaler Inhale 2 puffs into the lungs every  4 (four) hours as needed for wheezing or shortness of breath.    amitriptyline  (ELAVIL ) 25 MG tablet TAKE 3 TABLETS BY MOUTH AT BEDTIME.   amLODipine  (NORVASC ) 10 MG tablet TAKE 1 TABLET BY MOUTH EVERY DAY   amphetamine -dextroamphetamine  (ADDERALL XR) 30 MG 24 hr capsule Take 1 capsule (30 mg total) by mouth every morning.   amphetamine -dextroamphetamine  (ADDERALL) 15 MG tablet Take 1 tab po around 2 pm (Patient taking differently: Take 15 mg by mouth daily as needed (in the afternoon as needed for focus/attention.).)   clonazePAM  (KLONOPIN ) 0.5 MG tablet Take 1 tablet (0.5 mg total) by mouth 2 (two) times daily.   Erenumab -aooe (AIMOVIG ) 140 MG/ML SOAJ Inject 140 mg into the skin every 28 (twenty-eight) days.   hydrocortisone  (ANUSOL -HC) 2.5 % rectal cream Place 1 Application rectally 2 (two) times daily. (Patient taking differently: Place 1 Application rectally 2 (two) times daily as needed for hemorrhoids or anal itching.)   LINZESS  290 MCG CAPS capsule TAKE 1 CAPSULE BY MOUTH DAILY BEFORE BREAKFAST.   losartan -hydrochlorothiazide  (HYZAAR) 100-25 MG tablet TAKE 1 TABLET BY MOUTH EVERY DAY   metoprolol  succinate (TOPROL -XL) 50 MG 24 hr tablet Take 50 mg by mouth every morning.    omeprazole  (PRILOSEC) 40 MG capsule Take 1 capsule (40 mg total) by mouth daily. (Patient taking differently: Take 40 mg by mouth every evening.)   oxyCODONE  (OXY IR/ROXICODONE ) 5 MG immediate release tablet Take 1 tablet (5 mg total) by mouth every 6 (six) hours as needed for breakthrough pain or severe pain (pain score 7-10). (Patient not taking: Reported on 11/07/2023)   oxyCODONE  (OXY IR/ROXICODONE ) 5 MG immediate release tablet Take 1 tablet (5 mg total) by mouth every 4 (four) hours as needed for moderate pain (pain score 4-6) or severe pain (pain score 7-10).   promethazine  (PHENERGAN ) 12.5 MG tablet Take 1 tablet (12.5 mg total) by mouth every 6 (six) hours as needed for nausea or vomiting.   Rimegepant Sulfate   (NURTEC) 75 MG TBDP Take 1 tablet (75 mg total) by mouth daily as needed.   Safety Seal Miscellaneous MISC Apply 1 Application topically at bedtime. Medication Name: Melaxemic Cream (Tranexamic Acid, Kojic Acid, Vit C, Hydrocortisone ,Hyaluronic Acid)   Zavegepant HCl (ZAVZPRET ) 10 MG/ACT SOLN Place 10 mg into the nose as needed (for migraine).   [DISCONTINUED] amitriptyline  (ELAVIL ) 25 MG tablet Take 3 tablets (75 mg total) by mouth at bedtime.   [DISCONTINUED] clobetasol  ointment (TEMOVATE ) 0.05 % Apply 1 Application topically 2 (two) times daily. AFTER 2 WEEKS THEN STOP (Patient not taking: Reported on 02/28/2024)   [DISCONTINUED] LINZESS  290 MCG CAPS capsule TAKE 1 CAPSULE BY MOUTH DAILY BEFORE BREAKFAST.   [DISCONTINUED] sucralfate (CARAFATE) 1 GM/10ML suspension Take by mouth.   No facility-administered encounter medications on file as of 07/10/2024.    PAST MEDICAL HISTORY: Past Medical  History:  Diagnosis Date   ADHD    Allergies    Anemia    Anxiety    Arthritis    Asthma    Biliary acute pancreatitis 2010   Colon cancer (HCC) 2016   Transverse colon    Diabetes mellitus without complication (HCC)    Dyspnea    GERD (gastroesophageal reflux disease)    Hypertension    Hypertension    Migraine    Pneumonia     PAST SURGICAL HISTORY: Past Surgical History:  Procedure Laterality Date   ABLATION     BAND HEMORRHOIDECTOMY     BIOPSY  09/02/2021   Procedure: BIOPSY;  Surgeon: Cindie Carlin POUR, DO;  Location: [facility];  Service: Endoscopy;;   BREAST REDUCTION SURGERY Bilateral    CHOLECYSTECTOMY  2010   [place] , biliary pancreatitis   COLECTOMY  09/30/2015   distal transverse d/t adenocarcinoma   COLONOSCOPY N/A 06/11/2018   Procedure: COLONOSCOPY;  Surgeon: Shaaron Lamar HERO, MD;  Location: [facility];  Service: Endoscopy;  Laterality: N/A;  1:45pm   COLONOSCOPY WITH PROPOFOL  N/A 04/02/2023   Procedure: COLONOSCOPY WITH PROPOFOL ;  Surgeon: Shaaron Lamar HERO, MD;   Location: [facility];  Service: Endoscopy;  Laterality: N/A;  12:15 pm   DILITATION & CURRETTAGE/HYSTROSCOPY WITH NOVASURE ABLATION N/A 09/10/2018   Procedure: DILATATION & CURETTAGE/HYSTEROSCOPY WITH MINERVA  ENDOMETRIAL ABLATION;  Surgeon: Edsel Norleen GAILS, MD;  Location: [facility];  Service: Gynecology;  Laterality: N/A;   ESOPHAGOGASTRODUODENOSCOPY (EGD) WITH PROPOFOL  N/A 09/02/2021   Procedure: ESOPHAGOGASTRODUODENOSCOPY (EGD) WITH PROPOFOL ;  Surgeon: Cindie Carlin POUR, DO;  Location: [facility];  Service: Endoscopy;  Laterality: N/A;   GASTRIC ROUX-EN-Y N/A 10/15/2023   Procedure: LAPAROSCOPIC GASTRIC SLEEVE AND LYSIS OF ADHESIONS WITH UPPER ENDOSCOPY;  Surgeon: Tanda Locus, MD;  Location: [facility];  Service: General;  Laterality: N/A;   HEMORRHOID SURGERY N/A 07/04/2023   Procedure: EXTENSIVE HEMORRHOIDECTOMY;  Surgeon: Kallie Manuelita BROCKS, MD;  Location: [facility];  Service: General;  Laterality: N/A;   POLYPECTOMY  06/11/2018   Procedure: POLYPECTOMY;  Surgeon: Shaaron Lamar HERO, MD;  Location: [facility];  Service: Endoscopy;;  colon   POLYPECTOMY  04/02/2023   Procedure: POLYPECTOMY;  Surgeon: Shaaron Lamar HERO, MD;  Location: [facility];  Service: Endoscopy;;    ALLERGIES: Allergies  Allergen Reactions   Lisinopril Cough    FAMILY HISTORY: Family History  Problem Relation Age of Onset   Hypertension Mother    Diabetes Mellitus II Mother    Heart disease Sister        dx in late 78's   Breast cancer Paternal Grandmother    Breast cancer Paternal Aunt    Colon cancer Neg Hx    Liver disease Neg Hx     SOCIAL HISTORY: Social History   Tobacco Use   Smoking status: Never    Passive exposure: Never   Smokeless tobacco: Never  Vaping Use   Vaping status: Never Used  Substance Use Topics   Alcohol use: Yes    Comment: rare   Drug use: No   Social History   Social History Narrative   Are you right handed or left handed? right   Are you currently employed ? 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 And niece   What type of home do you live in: 1 story or 2 story? One  Caffeine 1 soda a day      Objective:  Vital Signs:  There were no vitals taken for this visit.  ***  Labs and Imaging review: New results: 03/18/24: CMP significant for albumin 3.0 CBC unremarkable Phos and Mg wnl  11/07/23: TSH wnl B12: 551 B1 wnl   Previously reviewed results: 10/16/23: CMP significant for glucose of 180 CBC w/ diff significant for WBC 13.1 (neutrophilic predominance)   HbA1c (09/05/23): 7.3 HbA1c (01/09/22): 6.3   03/28/23: BMP significant for K of 2.9. CBC unremarkable   Vit D (10/27/22): 8.47 B12 (10/27/22): 392   Recent Labs           Lab Results  Component Value Date    HGBA1C 6.3 08/17/2022      Recent Labs           Lab Results  Component Value Date    TSH 1.42 08/17/2022      Recent Labs[] Expand by Default           Lab Results  Component Value Date    ESRSEDRATE 25 (H) 08/31/2021      Normal or unremarkable: CBC, CMP   Iron studies: iron 81, TIBC 364, sat ratio 22.3, ferritin 39.7  Assessment/Plan:  This is [name], a 53 y.o. female with: ***   Plan: ***  Return to clinic in ***  Total time spent reviewing records, interview, history/exam, documentation, and coordination of care on day of encounter:  *** min  Venetia Potters, MD

## 2024-07-10 ENCOUNTER — Encounter: Payer: Self-pay | Admitting: Neurology

## 2024-07-10 ENCOUNTER — Ambulatory Visit: Admitting: Neurology

## 2024-07-14 ENCOUNTER — Telehealth: Payer: Self-pay | Admitting: Neurology

## 2024-07-14 NOTE — Telephone Encounter (Signed)
 We are writing to inform you that [facility], including all providers within this practice, will no longer be able to provide medical care to you.  This decision is the result of repeated missed appointments without adequate notice, which has disrupted our ability to provide timely and effective care to all patients. 07/10/24
